# Patient Record
Sex: Female | Born: 1955 | Race: White | Hispanic: No | Smoking: Former smoker
Health system: Southern US, Community
[De-identification: ages and names within clinical notes are randomized; demographics above are authoritative.]

## PROBLEM LIST (undated history)

## (undated) DIAGNOSIS — F32A Depression, unspecified: Secondary | ICD-10-CM

## (undated) DIAGNOSIS — G43909 Migraine, unspecified, not intractable, without status migrainosus: Secondary | ICD-10-CM

## (undated) DIAGNOSIS — I10 Essential (primary) hypertension: Secondary | ICD-10-CM

## (undated) DIAGNOSIS — M1990 Unspecified osteoarthritis, unspecified site: Secondary | ICD-10-CM

## (undated) DIAGNOSIS — F329 Major depressive disorder, single episode, unspecified: Secondary | ICD-10-CM

## (undated) DIAGNOSIS — K625 Hemorrhage of anus and rectum: Secondary | ICD-10-CM

## (undated) DIAGNOSIS — G8929 Other chronic pain: Secondary | ICD-10-CM

## (undated) DIAGNOSIS — G709 Myoneural disorder, unspecified: Secondary | ICD-10-CM

## (undated) DIAGNOSIS — J189 Pneumonia, unspecified organism: Secondary | ICD-10-CM

## (undated) DIAGNOSIS — M549 Dorsalgia, unspecified: Secondary | ICD-10-CM

## (undated) DIAGNOSIS — K859 Acute pancreatitis without necrosis or infection, unspecified: Secondary | ICD-10-CM

## (undated) DIAGNOSIS — J45909 Unspecified asthma, uncomplicated: Secondary | ICD-10-CM

## (undated) HISTORY — PX: CARPAL TUNNEL RELEASE: SHX101

## (undated) HISTORY — PX: BACK SURGERY: SHX140

## (undated) HISTORY — PX: LIPOMA EXCISION: SHX5283

## (undated) HISTORY — DX: Myoneural disorder, unspecified: G70.9

## (undated) HISTORY — DX: Unspecified asthma, uncomplicated: J45.909

## (undated) HISTORY — PX: NECK MASS EXCISION: SHX2079

## (undated) HISTORY — PX: LUMBAR DISC SURGERY: SHX700

## (undated) HISTORY — DX: Unspecified osteoarthritis, unspecified site: M19.90

## (undated) HISTORY — PX: HERNIA REPAIR: SHX51

## (undated) SURGERY — UPPER ENDOSCOPIC ULTRASOUND (EUS) RADIAL
Anesthesia: Monitor Anesthesia Care | Laterality: Left

---
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
## 1982-12-27 HISTORY — PX: TUBAL LIGATION: SHX77

## 1989-08-27 HISTORY — PX: KNEE ARTHROSCOPY: SHX127

## 1998-06-11 ENCOUNTER — Encounter: Admission: RE | Admit: 1998-06-11 | Discharge: 1998-09-09 | Payer: Self-pay | Admitting: Anesthesiology

## 1998-10-09 ENCOUNTER — Ambulatory Visit (HOSPITAL_COMMUNITY): Admission: RE | Admit: 1998-10-09 | Discharge: 1998-10-09 | Payer: Self-pay | Admitting: Family Medicine

## 1998-10-17 ENCOUNTER — Ambulatory Visit (HOSPITAL_COMMUNITY): Admission: RE | Admit: 1998-10-17 | Discharge: 1998-10-17 | Payer: Self-pay | Admitting: Family Medicine

## 1998-10-17 ENCOUNTER — Encounter: Payer: Self-pay | Admitting: Family Medicine

## 1998-10-23 ENCOUNTER — Encounter: Payer: Self-pay | Admitting: Family Medicine

## 1998-10-23 ENCOUNTER — Ambulatory Visit (HOSPITAL_COMMUNITY): Admission: RE | Admit: 1998-10-23 | Discharge: 1998-10-23 | Payer: Self-pay | Admitting: Family Medicine

## 1998-11-04 ENCOUNTER — Encounter: Payer: Self-pay | Admitting: Family Medicine

## 1998-11-04 ENCOUNTER — Ambulatory Visit (HOSPITAL_COMMUNITY): Admission: RE | Admit: 1998-11-04 | Discharge: 1998-11-04 | Payer: Self-pay | Admitting: Family Medicine

## 1999-02-20 ENCOUNTER — Encounter: Admission: RE | Admit: 1999-02-20 | Discharge: 1999-04-09 | Payer: Self-pay | Admitting: Neurological Surgery

## 2000-08-02 ENCOUNTER — Encounter: Payer: Self-pay | Admitting: Orthopedic Surgery

## 2000-08-02 ENCOUNTER — Encounter: Admission: RE | Admit: 2000-08-02 | Discharge: 2000-08-02 | Payer: Self-pay | Admitting: Orthopedic Surgery

## 2000-08-04 ENCOUNTER — Emergency Department (HOSPITAL_COMMUNITY): Admission: EM | Admit: 2000-08-04 | Discharge: 2000-08-04 | Payer: Self-pay | Admitting: *Deleted

## 2000-10-11 ENCOUNTER — Other Ambulatory Visit: Admission: RE | Admit: 2000-10-11 | Discharge: 2000-10-11 | Payer: Self-pay | Admitting: Family Medicine

## 2000-10-18 ENCOUNTER — Encounter: Admission: RE | Admit: 2000-10-18 | Discharge: 2000-10-18 | Payer: Self-pay | Admitting: Orthopedic Surgery

## 2000-10-18 ENCOUNTER — Encounter: Payer: Self-pay | Admitting: Orthopedic Surgery

## 2001-01-10 ENCOUNTER — Encounter: Admission: RE | Admit: 2001-01-10 | Discharge: 2001-04-10 | Payer: Self-pay | Admitting: Anesthesiology

## 2001-04-06 ENCOUNTER — Encounter: Admission: RE | Admit: 2001-04-06 | Discharge: 2001-07-05 | Payer: Self-pay | Admitting: Anesthesiology

## 2001-10-17 ENCOUNTER — Encounter (INDEPENDENT_AMBULATORY_CARE_PROVIDER_SITE_OTHER): Payer: Self-pay | Admitting: Specialist

## 2001-10-17 ENCOUNTER — Ambulatory Visit (HOSPITAL_BASED_OUTPATIENT_CLINIC_OR_DEPARTMENT_OTHER): Admission: RE | Admit: 2001-10-17 | Discharge: 2001-10-17 | Payer: Self-pay | Admitting: General Surgery

## 2002-04-24 ENCOUNTER — Other Ambulatory Visit: Admission: RE | Admit: 2002-04-24 | Discharge: 2002-04-24 | Payer: Self-pay | Admitting: Family Medicine

## 2003-04-01 ENCOUNTER — Encounter: Payer: Self-pay | Admitting: Obstetrics and Gynecology

## 2003-04-09 ENCOUNTER — Encounter (INDEPENDENT_AMBULATORY_CARE_PROVIDER_SITE_OTHER): Payer: Self-pay | Admitting: Specialist

## 2003-04-09 ENCOUNTER — Inpatient Hospital Stay (HOSPITAL_COMMUNITY): Admission: RE | Admit: 2003-04-09 | Discharge: 2003-04-11 | Payer: Self-pay | Admitting: Obstetrics and Gynecology

## 2003-12-28 HISTORY — PX: TOTAL ABDOMINAL HYSTERECTOMY: SHX209

## 2003-12-30 ENCOUNTER — Emergency Department (HOSPITAL_COMMUNITY): Admission: EM | Admit: 2003-12-30 | Discharge: 2003-12-31 | Payer: Self-pay | Admitting: Emergency Medicine

## 2006-09-23 ENCOUNTER — Ambulatory Visit: Payer: Self-pay | Admitting: Emergency Medicine

## 2006-09-26 ENCOUNTER — Ambulatory Visit: Payer: Self-pay | Admitting: Emergency Medicine

## 2006-10-01 ENCOUNTER — Encounter: Admission: RE | Admit: 2006-10-01 | Discharge: 2006-10-01 | Payer: Self-pay | Admitting: Orthopedic Surgery

## 2006-10-26 ENCOUNTER — Encounter: Payer: Self-pay | Admitting: Vascular Surgery

## 2006-10-26 ENCOUNTER — Ambulatory Visit (HOSPITAL_COMMUNITY): Admission: RE | Admit: 2006-10-26 | Discharge: 2006-10-26 | Payer: Self-pay | Admitting: Neurological Surgery

## 2006-11-09 ENCOUNTER — Encounter: Admission: RE | Admit: 2006-11-09 | Discharge: 2006-11-09 | Payer: Self-pay | Admitting: Orthopedic Surgery

## 2006-12-08 ENCOUNTER — Encounter: Payer: Self-pay | Admitting: Vascular Surgery

## 2006-12-08 ENCOUNTER — Ambulatory Visit (HOSPITAL_COMMUNITY): Admission: RE | Admit: 2006-12-08 | Discharge: 2006-12-08 | Payer: Self-pay | Admitting: Orthopedic Surgery

## 2007-11-27 DIAGNOSIS — R059 Cough, unspecified: Secondary | ICD-10-CM | POA: Insufficient documentation

## 2007-11-27 DIAGNOSIS — J309 Allergic rhinitis, unspecified: Secondary | ICD-10-CM | POA: Insufficient documentation

## 2007-11-27 DIAGNOSIS — Z9079 Acquired absence of other genital organ(s): Secondary | ICD-10-CM | POA: Insufficient documentation

## 2007-11-27 DIAGNOSIS — E669 Obesity, unspecified: Secondary | ICD-10-CM | POA: Insufficient documentation

## 2007-11-27 DIAGNOSIS — R05 Cough: Secondary | ICD-10-CM | POA: Insufficient documentation

## 2007-11-27 DIAGNOSIS — IMO0002 Reserved for concepts with insufficient information to code with codable children: Secondary | ICD-10-CM | POA: Insufficient documentation

## 2007-11-27 DIAGNOSIS — R0602 Shortness of breath: Secondary | ICD-10-CM | POA: Insufficient documentation

## 2007-11-27 DIAGNOSIS — R519 Headache, unspecified: Secondary | ICD-10-CM | POA: Insufficient documentation

## 2007-11-27 DIAGNOSIS — R51 Headache: Secondary | ICD-10-CM | POA: Insufficient documentation

## 2008-06-16 ENCOUNTER — Encounter: Admission: RE | Admit: 2008-06-16 | Discharge: 2008-06-16 | Payer: Self-pay | Admitting: Orthopedic Surgery

## 2008-08-16 ENCOUNTER — Ambulatory Visit (HOSPITAL_COMMUNITY): Admission: RE | Admit: 2008-08-16 | Discharge: 2008-08-16 | Payer: Self-pay | Admitting: Neurological Surgery

## 2008-10-14 ENCOUNTER — Inpatient Hospital Stay (HOSPITAL_COMMUNITY): Admission: RE | Admit: 2008-10-14 | Discharge: 2008-10-17 | Payer: Self-pay | Admitting: Neurological Surgery

## 2008-10-14 ENCOUNTER — Ambulatory Visit: Payer: Self-pay | Admitting: Vascular Surgery

## 2008-10-31 ENCOUNTER — Emergency Department (HOSPITAL_COMMUNITY): Admission: EM | Admit: 2008-10-31 | Discharge: 2008-10-31 | Payer: Self-pay | Admitting: Emergency Medicine

## 2009-01-09 ENCOUNTER — Ambulatory Visit: Payer: Self-pay | Admitting: Vascular Surgery

## 2009-01-09 ENCOUNTER — Inpatient Hospital Stay (HOSPITAL_COMMUNITY): Admission: RE | Admit: 2009-01-09 | Discharge: 2009-01-14 | Payer: Self-pay | Admitting: Neurological Surgery

## 2009-01-31 ENCOUNTER — Ambulatory Visit: Payer: Self-pay | Admitting: Vascular Surgery

## 2009-02-21 ENCOUNTER — Ambulatory Visit: Payer: Self-pay | Admitting: Vascular Surgery

## 2009-03-14 ENCOUNTER — Ambulatory Visit: Payer: Self-pay | Admitting: Vascular Surgery

## 2009-04-16 ENCOUNTER — Ambulatory Visit: Payer: Self-pay | Admitting: Vascular Surgery

## 2009-05-02 ENCOUNTER — Ambulatory Visit: Payer: Self-pay | Admitting: Vascular Surgery

## 2010-07-25 ENCOUNTER — Emergency Department (HOSPITAL_COMMUNITY): Admission: EM | Admit: 2010-07-25 | Discharge: 2010-07-25 | Payer: Self-pay | Admitting: Emergency Medicine

## 2010-11-13 ENCOUNTER — Encounter: Admission: RE | Admit: 2010-11-13 | Discharge: 2010-11-13 | Payer: Self-pay | Admitting: Neurosurgery

## 2010-12-27 HISTORY — PX: SPINAL FIXATION SURGERY W/ IMPLANT: SHX785

## 2011-01-01 LAB — BASIC METABOLIC PANEL
BUN: 8 mg/dL (ref 6–23)
CO2: 30 mEq/L (ref 19–32)
Calcium: 9.6 mg/dL (ref 8.4–10.5)
Chloride: 104 mEq/L (ref 96–112)
Creatinine, Ser: 1.01 mg/dL (ref 0.4–1.2)
GFR calc Af Amer: >60 mL/min (ref >60)
GFR calc non Af Amer: 58 mL/min — ABNORMAL LOW (ref >60)
Glucose, Bld: 126 mg/dL — ABNORMAL HIGH (ref 70–99)
Potassium: 4.4 mEq/L (ref 3.5–5.1)
Sodium: 142 mEq/L (ref 135–145)

## 2011-01-01 LAB — CBC
HCT: 44.8 % (ref 36.0–46.0)
Hemoglobin: 14.4 g/dL (ref 12.0–15.0)
MCH: 29.3 pg (ref 26.0–34.0)
MCHC: 32.1 g/dL (ref 30.0–36.0)
MCV: 91.2 fL (ref 78.0–100.0)
Platelets: 208 10*3/uL (ref 150–400)
RBC: 4.91 MIL/uL (ref 3.87–5.11)
RDW: 12.9 % (ref 11.5–15.5)
WBC: 7.5 10*3/uL (ref 4.0–10.5)

## 2011-01-01 LAB — DIFFERENTIAL
Basophils Absolute: 0 10*3/uL (ref 0.0–0.1)
Basophils Relative: 0 % (ref 0–1)
Eosinophils Absolute: 0.2 10*3/uL (ref 0.0–0.7)
Eosinophils Relative: 3 % (ref 0–5)
Lymphocytes Relative: 28 % (ref 12–46)
Lymphs Abs: 2.1 10*3/uL (ref 0.7–4.0)
Monocytes Absolute: 0.6 10*3/uL (ref 0.1–1.0)
Monocytes Relative: 8 % (ref 3–12)
Neutro Abs: 4.6 10*3/uL (ref 1.7–7.7)
Neutrophils Relative %: 61 % (ref 43–77)

## 2011-01-01 LAB — TYPE AND SCREEN
ABO/RH(D): A POS
Antibody Screen: NEGATIVE

## 2011-01-04 ENCOUNTER — Inpatient Hospital Stay (HOSPITAL_COMMUNITY)
Admission: RE | Admit: 2011-01-04 | Discharge: 2011-01-07 | Payer: Self-pay | Source: Home / Self Care | Attending: Neurosurgery | Admitting: Neurosurgery

## 2011-01-19 NOTE — Op Note (Signed)
[name] [name], [name] [name]                ACCOUNT NO.:  [id number]  MEDICAL RECORD NO.:  [id number]          PATIENT TYPE:  INP  LOCATION:  3523                         FACILITY:  [facility]  PHYSICIAN:  Molly Savarino A. Zen Cedillos, M.D.    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  PROCEDURE NAME:  L2-3, L3-4 decompressive laminectomies with bilateral L2, L3 and L4 decompressive foraminotomies, more than would be required for simple interbody fusion alone.  L2-3, L3-4 posterior lumbar fusion utilizing tangent interbody allograft wedge, Telamon interbody PEEK cage and local autografting.  L2-3 for posterolateral arthrodesis utilizing segmental pedicle screw fixation and local autograft.  SURGEON:  Kathaleen Maser. Ebonye Reade, MD  ASSISTANT:  Donalee Citrin, MD  ANESTHESIA:  General endotracheal.  INDICATIONS:  Ms. [name] [name] is a 55 year old female with status post a previous anterior lumbar fusion at L4-5 and L5-S1 who presents with progressive worsening intractable back pain with bilateral lower extremity symptoms.  Workup demonstrates evidence of marked facet arthropathy, instability and stenosis at L2-3 and L3-4.  The patient presents now for two-level lumbar decompression and fusion in hopes of improving her symptoms.  OPERATIVE NOTE:  The patient was brought to the operating room and placed on operating table in supine position.  After adequate level of anesthesia was achieved, the patient was placed prone onto Wilson frame, appropriately padded.  The patient's lumbar region was prepped and draped in sterilely.  A 10-blade was used to make a curvilinear skin incision overlying the L2, 3, 4 levels.  This was  carried down sharply in the midline.  A subperiosteal dissection was then performed exposing the lamina and facet joints of L2, L3, L4 as well as transverse processes of the aforementioned levels.  Deep self-retaining retractor was placed.  Intraoperative fluoroscopy was used and levels were confirmed.  Decompressive laminectomy was then performed using Leksell rongeurs, Kerrison rongeurs, and high-speed drill to remove the entire lamina of L2, entire lamina of L3, inferior facets of L2 bilaterally, superior facets of L3 and a superior facets of L4 bilaterally.  All bone was cleaned and used in later autografting.  Ligamentum flavum was elevated and resected in piecemeal fashion using Kerrison rongeurs. Wide decompressive foraminotomies were then performed along the course of exiting L2, L3, and L4 nerve roots.  Bilateral diskectomies were then performed at L2-3 and L3-4.  Disk space was then sequentially dilated up to 8 mm, then the 8-mm distractor left on the patient's right side. Thecal sac and nerve root were inspected on the left side.  Disk space was then reamed and then cut with 8-mm tangent instrument.  Soft tissues were then removed from the interspace.  An 8 x 22 mm Telamon cage packed with morselized autograft, then packed into place, and recessed approximately 3 mm from the posterior cortical margin of L5. Distractors were removed from the patient's  right side.  Thecal sac and nerve roots were inspected on the right side.  Disk space once again reamed and then cut with 8-mm tangent instrument.  Soft tissue was once again removed from the interspace.  Disk space was further curettaged. Morselized autograft was then packed in the interspace starting first at L2-3.  An 8 x 26-mm tangent wedge was then packed into place and recessed approximately 1 mm from posterior cortical margin.  Distractors were removed from the patient's contralateral side.  Thecal sac and nerve roots were  protected on this side.  Disk space was then reamed and then cut with 8-mm tangent instrument.  Soft tissue was removed from the interspace.  Morselized autograft was packed in the interspace.  An 8  x 22-mm tangent cage was packed with morselized autograft, was then impacted in place and recessed approximately 2 mm from posterior cortical margin of L2.  Interbody fusion was then performed in similar fashion at L2-4 again without complications.  Pedicles at L2, L3 and L4 were then identified using surface landmarks and intraoperative fluoroscopy.  Superficial bone overlying the pedicle was then removed using a high-speed drill.  Each pedicle was then probed using pedicle awl.  Each pedicle awl track was then tapped with 5.2-mm screw tapper. Each screw down hole was then probed and found to fit solidly with bone. A 6.75 x 40-mm radius screws were then placed bilaterally at L2, L3, and L4.  Transverse processes of L2, 3 and 4 were then decorticated using high-speed drill.  Morselized autograft was packed posterolateral for later fusion.  Short segment of titanium rods were then placed over screw heads at L2, L3 and L4.  Locking caps were then placed over screw heads and locking caps were then engaged with construct under compression.  Transverse connector was placed.  Final images revealed good position of bone graft and hardware with proper operative level and normal alignment of spine.  The wound was then irrigated one final time. A medium Hemovac drain was left in the interspace.  The wound was then closed in layers with Vicryl suture.  Steri-Strips and sterile dressing were applied.  There were no apparent complications.  The patient tolerated the procedure well and she returned to the recovery room postoperatively.          ______________________________ Kathaleen Maser Laray Rivkin, M.D.     HAP/MEDQ  D:  01/04/2011  T:  01/05/2011  Job:  [id number]  Electronically Signed by Julio Sicks M.D. on  01/19/2011 08:15:32 AM

## 2011-01-21 LAB — SURGICAL PCR SCREEN: Staphylococcus aureus: NEGATIVE

## 2011-01-22 ENCOUNTER — Encounter
Admission: RE | Admit: 2011-01-22 | Discharge: 2011-01-22 | Payer: Self-pay | Source: Home / Self Care | Attending: Neurosurgery | Admitting: Neurosurgery

## 2011-04-08 ENCOUNTER — Ambulatory Visit
Admission: RE | Admit: 2011-04-08 | Discharge: 2011-04-08 | Disposition: A | Discharge status: Home / Self Care | Payer: Medicare Other | Source: Ambulatory Visit | Attending: Neurosurgery | Admitting: Neurosurgery

## 2011-04-08 ENCOUNTER — Other Ambulatory Visit: Payer: Self-pay | Admitting: Neurosurgery

## 2011-04-08 DIAGNOSIS — M48061 Spinal stenosis, lumbar region without neurogenic claudication: Secondary | ICD-10-CM

## 2011-04-08 DIAGNOSIS — M5137 Other intervertebral disc degeneration, lumbosacral region: Secondary | ICD-10-CM

## 2011-04-08 DIAGNOSIS — M51379 Other intervertebral disc degeneration, lumbosacral region without mention of lumbar back pain or lower extremity pain: Secondary | ICD-10-CM

## 2011-04-12 LAB — POCT I-STAT 4, (NA,K, GLUC, HGB,HCT)
Glucose, Bld: 104 mg/dL — ABNORMAL HIGH (ref 70–99)
Hemoglobin: 15.3 g/dL — ABNORMAL HIGH (ref 12.0–15.0)
Potassium: 4.1 mEq/L (ref 3.5–5.1)
Sodium: 138 mEq/L (ref 135–145)

## 2011-04-12 LAB — BASIC METABOLIC PANEL
CO2: 28 mEq/L (ref 19–32)
Calcium: 9.2 mg/dL (ref 8.4–10.5)
Calcium: 8.7 mg/dL (ref 8.4–10.5)
Chloride: 102 mEq/L (ref 96–112)
GFR calc Af Amer: >60 mL/min (ref >60)
GFR calc non Af Amer: >60 mL/min (ref >60)
Glucose, Bld: 114 mg/dL — ABNORMAL HIGH (ref 70–99)
Potassium: 4.6 mEq/L (ref 3.5–5.1)
Sodium: 142 mEq/L (ref 135–145)
Sodium: 137 mEq/L (ref 135–145)

## 2011-04-12 LAB — ANAEROBIC CULTURE

## 2011-04-12 LAB — CULTURE, ROUTINE-ABSCESS

## 2011-04-12 LAB — CBC
HCT: 33.8 % — ABNORMAL LOW (ref 36.0–46.0)
Hemoglobin: 13.2 g/dL (ref 12.0–15.0)
Hemoglobin: 11.4 g/dL — ABNORMAL LOW (ref 12.0–15.0)
MCHC: 33.8 g/dL (ref 30.0–36.0)
MCV: 85.7 fL (ref 78.0–100.0)
RBC: 4.71 MIL/uL (ref 3.87–5.11)
RBC: 3.94 MIL/uL (ref 3.87–5.11)
RDW: 13.4 % (ref 11.5–15.5)
RDW: 13.8 % (ref 11.5–15.5)

## 2011-04-12 LAB — GRAM STAIN

## 2011-05-11 NOTE — H&P (Signed)
NAMEJANEANE, COZART NO.:  [id number]   MEDICAL RECORD NO.:  [id number]          PATIENT TYPE:  INP   LOCATION:  2037                         FACILITY:  [facility]   PHYSICIAN:  Larina Earthly, M.D.    DATE OF BIRTH:  Jul 28, 1956   DATE OF ADMISSION:  01/09/2009  DATE OF DISCHARGE:                              HISTORY & PHYSICAL   PRIMARY CARE PHYSICIAN:  Windle Guard, M.D.   CHIEF COMPLAINT:  Drainage from abdominal incision x2 months.   HISTORY OF PRESENT ILLNESS:  Ms. Prestage is a 55 year old Caucasian  female who is status post anterior lumbar decompression on October 14, 2008, by Dr. Danielle Dess.  Dr. Tawanna Cooler Early assisted with the anterior  exposure.  She was discharged home on October 17, 2008.  Apparently, on  October 31, 2008, she had a coughing fit in which she felt a pop in her  left lower quadrant.  She then noticed bloody thin drainage from her  distal surgical incision.  She was seen in the emergency department  where they did abdominal x-rays which showed no gross abnormalities.  She was seen by Dr. Danielle Dess within the next day or so, but apparently was  never prescribed antibiotics.  She has been doing dressing changes  herself using a cut up towel which she is applying over the incision and  changing it multiple times during the day as needed for saturation.  The  drainage has now developed more thick consistency with sediment.  Her  abdominal incision remains tender to touch.  She, however, has had no  fever.  She contacted Dr. Danielle Dess again to report persistent drainage and  he did order a CT scan of the abdomen and pelvis with contrast which was  done on January 09, 2009, which showed large complex fluid collection  with gas along anterior margin of the anterior abdominal wall  musculature and the pelvis and felt most consistent with abscess.  She  also had mild pelvic reactive adenopathy and sigmoid diverticulosis  without evidence of diverticulitis.   PAST MEDICAL AND SURGICAL HISTORY:  1. Two back surgeries, most recently anterior lumbar decompression of      L4-5 and L5-S1 with an arthrodesis with PEEK spacer, allograft and      autograft on October 14, 2008, for lumbar spondylosis and      radiculopathy by Dr. Danielle Dess.  2. History of asthma.  3. Allergic rhinitis.  4. Chronic headaches felt most consistent with migraines.  5. Back lipomas, status post resection x2.  6. Hysterectomy.  7. Obesity.  8. Recurrent childhood pneumonia.  9. History of rectal prolapse with history of repair.  10.History of bladder tacking.   MEDICATIONS:  1. Neurontin 300 mg p.o. t.i.d.  2. Robaxin t.i.d.  She believes the dose is 500 mg.  3. Vicodin p.r.n.   ALLERGIES:  IBUPROFEN which causes GI upset and MUSHROOMS which causes  rash.   FAMILY HISTORY:  Mother was diagnosed with Hodgkin lymphoma and she has  had a brother who died of liver cancer who had a history of alcohol  abuse.   SOCIAL HISTORY:  She has not smoked since March 1996.  She has had no  recent alcohol use.  She lives with her husband and her mother in  [place].  She is currently unemployed and apparently was denied  disability after her recent back surgery as it was felt to be a  preexisting condition.   REVIEW OF SYSTEMS:  She continues to have back and lower extremity pain  following her back surgery.  She has frequent attacks of muscle spasm  all over her back.  She is able to ambulate without difficulty.  Denies  any numbness or tingling other than some tingling along the median nerve  distribution on her right upper extremity.  She does wear top dentures  and also has some teeth pain on the right lower jaw.  This is where she  had a previous nerve root canal.  She denies chest pain, shortness of  breath, or dysuria.   PHYSICAL EXAMINATION:  GENERAL:  Ms. Geno is a 55 year old Caucasian  female who is alert and cooperative.  She is tearful presently with the  idea  of going to surgery.  She has obese body habitus.  HEENT:  Head is normocephalic and atraumatic.  Pupils are equal, round,  and reactive to light.  Oral mucosa is pink and moist.  No erythema was  noted.  She does have complete upper denture.  She has several missing  teeth on the lower jaw on her right.  There is no dental abscess noted.  NECK:  Supple.  No carotid bruits were auscultated.  HEART:  Regular rate and rhythm.  No murmur, rub, or gallop was noted.  CHEST:  Lung sounds were clear throughout.  No wheezes or rhonchi were  noted.  ABDOMEN:  Soft and nondistended, but obese.  She had good bowel sounds.  She has a healing midline lower abdominal incision, is tender to touch  there at the distal incision.  There is a small punctate opening that  does have active drainage which is a combination of thin and thicker  drainage with a yellowish-orange color.  There was no significant  surrounding erythema.  EXTREMITIES:  Palpable dorsalis pedis and radial pulses bilaterally.  NEUROLOGIC:  She is alert and oriented x4.  Lower extremity and upper  extremity movements are strong and symmetrical bilaterally.   IMPRESSION:  Abdominal wall abscess with recent history of anterior  lumbar decompression in October 2009.   PLAN:  Ms. Giambra will be admitted to [facility].  She will be  taken to the operating room by Dr. Bosie Helper partner, Dr. Venida Jarvis for an I and D of her abdominal wall abscess.  At this point, we  will plan to start IV antibiotics and obtain intraoperative cultures and  begin dressing changes postoperatively.  Dr. Danielle Dess is also aware of her  admission.       Jerold Coombe, P.A.      Larina Earthly, M.D.  Electronically Signed    AWZ/MEDQ  D:  01/09/2009  T:  01/10/2009  Job:  [id number]   cc:   Larina Earthly, M.D.  Stefani Dama, M.D.  Windle Guard, M.D.

## 2011-05-11 NOTE — Discharge Summary (Signed)
NAMENARCISA, GANESH                ACCOUNT NO.:  [id number]   MEDICAL RECORD NO.:  [id number]          PATIENT TYPE:  INP   LOCATION:  3036                         FACILITY:  [facility]   PHYSICIAN:  Stefani Dama, M.D.  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   DISCHARGE DIAGNOSIS:  Lumbar spondylosis at L4-L5 and L5-S1 with lumbar  radiculopathy.   OPERATION:  Anterior lumbar decompression at L4-L5 and L5-S1 and  arthrodesis with PEEK spacer, allograft, and autograft on that day.   CONDITION ON DISCHARGE:  Improving.   HOSPITAL COURSE:  Luca Burston is a 55 year old individual who has had  significant problems with back and bilateral lower extremity pain.  She  has advanced spondylitic degeneration at L4-L5 and L5-S1.  She has been  advised regarding surgical decompression arthrodesis via an anterior  retroperitoneal approach.  She is taken to the operating room on the day  of admission where this procedure was performed with the help of Dr.  Tawanna Cooler Early.  Postoperatively, the patient complained of the typical  retroperitoneal pain with this rapidly improved on day #2.  She was  started on some oral pain medication and gradually mobilized.  Her  incision has remained clean and dry.  Foley catheter was removed on the  second postoperative day and she was started on oral pain medication.  She seemed to be tolerating the pain medication well.  She has had a  bowel movement and has been advanced to a regular diet and her incision  is clean and dry.  At time of discharge, she is given a prescription for  Percocet, #40, without refills; Valium 5 mg, #20, without refills; and  Lovenox 40 mg subcu daily for 7 days.   CONDITION ON DISCHARGE:  Stable.      Stefani Dama, M.D.  Electronically  Signed     HJE/MEDQ  D:  10/17/2008  T:  10/18/2008  Job:  [id number]

## 2011-05-11 NOTE — Op Note (Signed)
[name] [name], [name] [name]                ACCOUNT NO.:  [id number]   MEDICAL RECORD NO.:  [id number]          PATIENT TYPE:  INP   LOCATION:  3107                         FACILITY:  [facility]   PHYSICIAN:  Larina Earthly, M.D.    DATE OF BIRTH:  07/17/1956   DATE OF PROCEDURE:  10/14/2008  DATE OF DISCHARGE:                               OPERATIVE REPORT   PREOPERATIVE DIAGNOSIS:  Degenerative disk disease, lumbar spine.   POSTOPERATIVE DIAGNOSIS:  Degenerative disk disease, lumbar spine.   PROCEDURE:  Anterior exposure for a left ALIF, which would be dictated  as a separate note by Dr. Barnett Abu.   SURGEON:  Larina Earthly, MD   ASSISTANT:  Stefani Dama, MD   ANESTHESIA:  General endotracheal.   COMPLICATIONS:  None.   DISPOSITION:  To recovery room, stable.   INDICATIONS FOR THE PROCEDURE:  The patient is a 55 year old white  female with progressively degenerative lumbar disk disease.  She was  seen preoperatively by Dr. Danielle Dess who recommended an L4-5, L5-1 lumbar  fusion.  She is taken to the operating room this time for exposure.  I  discussed the procedure with the patient including a potential risk to  major vascular structures in the pelvis, ureter and the potential risk  for bleeding and infection were felt to be low.  The patient understood  and wished proceed with the procedure.   PROCEDURE IN DETAIL:  The patient was taken to the operating room and  placed supine position.  The area of the abdomen was prepped and draped  in a sterile fashion.  Incision was made to the left paramedian incision  and carried down through the subcutaneous tissue with electrocautery.  The patient was moderately obese.  The anterior fascia was exposed and  the fat was mobilized off the anterior fascia.  The anterior rectus  sheath was opened longitudinally just to the left of the linea alba.  The rectus muscle was reflected laterally.  The semilunar line was  identified and the  retroperitoneal space was entered bluntly below the  semilunar line.  The retroperitoneal contents were mobilized from the  left to the right.  The retroperitoneal space was entered lateral to the  posterior sheath and the posterior sheath was opened laterally as well.  The peritoneal space was not entered and the retroperitoneal dissection  was continued to mobilize down to the level of psoas muscle.  The iliac  vessels were identified and the ureter was identified.  The uterine and  peroneal contents were mobilized to the right.  The Balfour retractor  was used for exposure.  The area in the bifurcation of the iliac vessels  were identified to give exposure to L5-S1.  The middle sacral vessels  were controlled with hemoclips and divided.  The iliac vessels were  mobilized to allow adequate access to the L5-S1 disk space.  Attention  was then turned to the mobilization for exposure of L4-5.  The  iliolumbar vein was identified and was ligated with 2-0 silk ties and  divided.  The iliac vessels were continued  to be mobilized to allow  retraction to the right of the vertebral body.  This gave adequate  exposure for L4-5 diskectomy.  Next, the Brau retractor was positioned  on the table and the blades were positioned to give exposure for L4-5  diskectomy.  On positioning this, the tie that was on the iliac vein  that had been used to control the iliolumbar vein, became dislodged.  Bleeding was controlled with digital pressure initially and then sponge  sticks.  The resulting defect in the iliac vein was closed with a  running 5-0 Prolene suture.  The sponge sticks were removed and the  adequate hemostasis was obtained.  The remaining lumbar fusion portion  of the operation will be dictated as a separate note by Dr. Barnett Abu.  Upon completion of the disk stabilization, the retroperitoneal  contents were allowed to return into the retroperitoneal space on the  left pelvis.  There was no  injury to the ureter or the iliac vessels.  The wound was irrigated.  The anterior rectus sheath was closed with a 0  Vicryl suture in a running fashion.  The skin was closed with 3-0  subcuticular Vicryl stitch.  Benzoin and Steri-Strips were applied and  the patient was taken to the recovery room in stable condition.      Larina Earthly, M.D.  Electronically Signed     TFE/MEDQ  D:  10/15/2008  T:  10/15/2008  Job:  [id number]   cc:   Stefani Dama, M.D.

## 2011-05-11 NOTE — Discharge Summary (Signed)
[name] [name], TURVEY NO.:  [id number]   MEDICAL RECORD NO.:  [id number]          PATIENT TYPE:  INP   LOCATION:  3031                         FACILITY:  [facility]   PHYSICIAN:  Larina Earthly, M.D.    DATE OF BIRTH:  [birth year]   DATE OF ADMISSION:  01/09/2009  DATE OF DISCHARGE:  01/14/2009                               DISCHARGE SUMMARY   ATTENDING PHYSICIAN:  Larina Earthly, MD   ADMISSION DIAGNOSIS:  Abdominal wall abscess status post recent anterior  lumbar decompression.   FINAL DISCHARGE DIAGNOSES:  1. Abdominal wall abscess status post incision and drainage (cultures      negative) to date.  2. History of asthma.  3. Allergic rhinitis.  4. Chronic headaches, felt most consistent with migraines.  5. History of back lipoma status post resection x2.  6. Hysterectomy.  7. Obesity.  8. Recurrent childhood pneumonia.  9. History of rectal prolapse with status post repair.  10.History of bladder tacking.  11.Lumbar spondylosis and radiculopathy status post 2 back surgeries,      most recently anterior lumbar decompression at L4-5 and L5-S1 on      October 14, 2008, by Dr. Danielle Dess.   BRIEF HISTORY:  Ms. Cavalieri is a 55 year old Caucasian female, who has  undergone status post lumbar decompression of L4-L5 and L5-S1 by Dr.  Danielle Dess.  This was done on October 14, 2008.  In early November, she had  a coughing fit and felt a pop in her left lower quadrant.  She then  noticing some bloody drainage from her distal surgical incision.  She  was seen in the emergency department within the next few weeks, also by  Dr. Danielle Dess.  She was not prescribed antibiotics but continued daily and  as needed dry dressing changes to her distal abdominal wound.  She had  no fevers.  Recently, the abdominal incision has become more tender and  drainage more thickened in nature.  Dr. Danielle Dess ordered a CT scan of the  abdomen and pelvis with contrast, which was done on January 09, 2009,  which showed a large complex fluid collection, most consistent with  abscess.  The measurements were 13.2 x 8.8 x 11.9 cm.  It was felt she  should be taken to the operating room for incision and drainage.   HOSPITAL COURSE:  Ms. Roedl was admitted to [facility] on  January 09, 2009.  She underwent incision and drainage of her abdominal  wall abscess on that same day by Dr. Bosie Helper partner, Dr. Waverly Ferrari.  Cultures were sent, which showed no growth x3 days.  Anaerobic  cultures are still preliminary but also showed no organisms.  While we  are awaiting culture results, she was placed on Zosyn.  She is also on  Lovenox for DVT prophylaxis.  She had twice a day wet-to-dry dressing  changes to her abdominal wound.  She required Percocet and morphine for  pain initially.  By January 14, 2009, Ms. Klinger was felt appropriate  for discharge home.  Since the cultures were negative, her antibiotics  were discontinued.  We felt her wound was healing well, and we changed  wet-to-dry saline gauze dressing changes to once daily.  There was a  fair amount of tunneling that required packing during the dressing  change.  The wound appeared clean at discharge.  There is no evidence of  surrounding cellulitis.  Her vitals were stable showing most recently a  temperature of 98.2, heart rate of 68, blood pressure 113/73, oxygen  saturation 98% on room air.  She was ambulating independently, although  with some back pain complaints, which have been chronic in nature.  She  is also tolerating a regular food.  Her postoperative labs show a sodium  of 137, potassium 4.6, chloride 102, CO2 28, blood glucose 114, BUN of  7, creatinine 0.87, calcium 8.7.  White count of 10.3, hemoglobin 11.4,  hematocrit of 33.8, and platelet count of 332.   DISPOSITION:  Ms. Winburn was felt appropriate for discharge home on  January 14, 2009, in stable and improving condition.   DISCHARGE MEDICATIONS:   1. Neurontin 300 mg p.o. t.i.d.  2. Robaxin t.i.d., she believes the dose is 500 mg daily as needed for      spasms.  3. Vicodin as needed.  She was instructed to hold this while on      Percocet.  4. Percocet 5/325 mg 1-2 tablets p.o. q.4 h. p.r.n. for pain.   DISCHARGE INSTRUCTIONS:  She is to continue her preoperative diet,  increase her activity slowly, and avoid driving for the next 2 weeks  until seen by Dr. Arbie Cookey.  She may shower.  She can coordinate these with  her  Just prior dressing changes.  I have arranged for home health nurse to  assist with daily saline gauze dressing changes to her abdominal wound,  as well as a home health PT for home safety evaluation.  She will see  Dr. Arbie Cookey in approximately 2 weeks, but she should call sooner if she  develops fever greater than 101, redness, or purulent drainage from her  abdominal wound.  She is to follow up with Dr. Danielle Dess as directed.      Jerold Coombe, P.A.      Larina Earthly, M.D.  Electronically Signed    AWZ/MEDQ  D:  01/14/2009  T:  01/14/2009  Job:  [id number]   cc:   Stefani Dama, M.D.  Windle Guard, M.D.

## 2011-05-11 NOTE — Assessment & Plan Note (Signed)
OFFICE VISIT   HADAR, ELGERSMA  DOB:  1956/10/31                                       03/14/2009  KGURK#:[id number]   The patient presents today for continued followup of her abdominal wall  abscess that she developed after an ALIF procedure.  I had I and D'd her  wound on 01/09/2009.  She continues to have contraction of her wound and  now has just the area of a Q-tip in the base of this.  This does not  undermine and there is no evidence of any excessive drainage from this.  The wound is approximately 0.5 cm in diameter and tracks approximately  1.5 to 2 cm.  She has no surrounding erythema and is having much less  discomfort.  She was asking about returning to her usual activities and  I feel she is fine from an abdominal standpoint.  She is to follow up  with Dr. Danielle Dess regarding ongoing discomfort.  She will see [place] again in  6 weeks at which time hopefully she will have complete closure of her  wound.   Larina Earthly, M.D.  Electronically Signed   TFE/MEDQ  D:  03/14/2009  T:  03/14/2009  Job:  [id number]

## 2011-05-11 NOTE — Op Note (Signed)
NAMENEMESIS, RAINWATER                ACCOUNT NO.:  [id number]   MEDICAL RECORD NO.:  [id number]          PATIENT TYPE:  INP   LOCATION:  3107                         FACILITY:  [facility]   PHYSICIAN:  Stefani Dama, M.D.  DATE OF BIRTH:  [birth year]   DATE OF PROCEDURE:  10/14/2008  DATE OF DISCHARGE:                               OPERATIVE REPORT   PREOPERATIVE DIAGNOSIS:  Lumbar spondylosis at L4-L5 and L5-S1 with  lumbar radiculopathy.  Lateral recess stenosis.   POSTOPERATIVE DIAGNOSIS:  Lumbar spondylosis at L4-L5 and L5-S1 with  lumbar radiculopathy.  Lateral recess stenosis.   PROCEDURES:  Anterior lumbar decompression at L4-L5 and L5-S1,  arthrodesis with PEEK spacer and allograft with infuse anterior plate  fixation at L4-L5 and L5-S1.   SURGEON:  Stefani Dama, MD   CO-SURGEONLarina Earthly, MD for closure and approach.   INDICATIONS:  Darien Mignogna is a 55 year old individual who has had  significant back pain with bilateral lower extremity pain, having had  advanced spondylitic changes at L4-L5 and L5-S1.  The patient was  advised regarding surgical decompression and arthrodesis at the L4-L5  and L5-S1 levels.  She is now taken to the operating room for this  procedure.   PROCEDURE:  The patient was brought to the operating room and placed on  the table in supine position.  After smooth induction of general  endotracheal anesthesia, she had placement of appropriate central venous  arterial monitoring lines and a Foley catheter.  The abdomen was prepped  and draped with alcohol and DuraPrep and Dr. Arbie Cookey performed an anterior  retroperitoneal approach to L4-L5 and L5-S1 which he will dictate  separately and I started my portion of the procedure.  I opened the  anterior longitudinal ligament at L4-L5 and removed a significant  quantity of severely degenerated, desiccated disk material at the L4-L5  level.  Series of curettes and rongeurs were used to remove the disk  from within the disk space until the region of posterior longitudinal  ligament was reached.  Self-retaining spreader was placed in the wound  and carefully the interspace could be brought back to plate.  There was  substantial osteophytic overgrowth from the inferior margin of the body  of L4 and this was taken up with a 2 and 3-mm Kerrison punch laterally  to either side.  The superior margin of the body of L5 was similarly  decompressed.  The endplates were then rongeured, smoothed, and prepared  with a high-speed drill and a series of curettes to remove any remnants  of cartilaginous material.  The interspace was then sized for an  appropriate-sized spacer and it was felt that a 12-mm 8-degree 26 x 30-  mm spacer would fit well into this interspace.  This was then filled  with some Vitoss which was soaked with the patient's blood in addition  to some infuse sponge and then this was tamped into the interspace of  the squid applicator and then the anterior plate was fixed with 20-mm  screws using the available guide.  Final radiographs were obtained  in AP  and lateral projection and identified good position of the spacer at L4-  L5.  Attention was then turned to L5-S1.  Retractors were replaced to  expose L5-S1 and then the interspace was opened with #15 blade.  A  series of curettes and rongeurs were similarly used to decompress the  space.  Once the endplates were completely decorticated and the back of  the disk space was completely evacuated again, there was noted to be a  substantial osteophyte from the inferior margin of the body of L5 which  was taken up with 2 and 3-mm Kerrison punch.  Hemostasis from epidural  bleeding was controlled with some small pledgets of Gelfoam soaked in  thrombin which were later irrigated away.  Ultimately, the interspace  was sized for a 12-mm 12 degrees lordosis spacer measuring 26 x 30 mm in  size.  This was again filled with same material and then  the anterior  plate was fixed with 4 locking 20-mm screws.  Final radiographs were  obtained in AP and lateral projections of L4-L5 and L5-S1.  The  retractors were carefully removed and the procedure was then turned over  to Dr. Tawanna Cooler Early for final closure.  The patient tolerated the  procedure well.  Blood loss for this portion was estimated at 450 mL.      Stefani Dama, M.D.  Electronically Signed     HJE/MEDQ  D:  10/14/2008  T:  10/15/2008  Job:  [id number]

## 2011-05-11 NOTE — Assessment & Plan Note (Signed)
OFFICE VISIT   NAIDA, ESCALANTE  DOB:  02/13/1956                                       05/02/2009  GBTDV#:[id number]   Lisette Mancebo presents today for continued follow up of her abdominal  wound.  She is a pleasant 55 year old female who underwent anterior  approach for lumbar fusion by myself with Dr. Danielle Dess doing the ALIF in  October 2009.  She initially did well but then presented with a large  abdominal wall abscess.  She was taken to the operating room on the day  of presentation, by Dr. Cari Caraway, on January 09, 2009.  She has had  ongoing packing of this since that time.  She had done well and had  continued shrinking of the wound and her husband is also participating  in the wound care and has done an excellent job.  Today, she looks quite  good, she has some burning around the incision but no specific pain  related to the incision.  She has completely closed this area.  There is  a 1-mm area of opening in the very bottom of this which does not track  anywhere with the wooden end of a Q-Tip probing.  She has minimal  drainage from this.  I have asked that she continue to keep a Band-Aid  over this just to simply keep from soiling her clothes and she will  notify us should she develop any new problems.  Otherwise, will see [place]  again on an as-needed basis with a healed abdominal wound.   Larina Earthly, M.D.  Electronically Signed   TFE/MEDQ  D:  05/02/2009  T:  05/05/2009  Job:  [id number]   cc:   Stefani Dama, M.D.  Di Kindle. Edilia Bo, M.D.

## 2011-05-11 NOTE — Assessment & Plan Note (Signed)
OFFICE VISIT   [name] [name], [name] [name]  DOB:  25-Jul-1956                                       01/31/2009  [id number]#:[id number]   The patient presents today for follow-up of her abdominal wall wound.  The patient is a very pleasant 55 year old white female who underwent  ALIF with approach by myself with Dr. Danielle Dess in October 2009.  She  initially had good healing of her wound but then developed drainage from  this area and presented with a large abscess, it was confirmed by CT  scan.  She was taken to the operating room on the day of presentation,  on January 09, 2009, by Dr. Cari Caraway.  She underwent debridement of  this large area and packing.  She was in the hospital for approximately  5 days following this.  She had good response to the packing.  She is  here today for follow-up.  She is having Home Health nurse pack this 2-3  times a week and her husband is packing it multiple times.  She does  have a fair amount of serous drainage continuing from this.  On physical  exam, the abdominal wound itself shows no evidence of surrounding  erythema or induration.  She has an area approximately 3-4 cm in  diameter that is opened and this does undermine most particularly to the  left where it undermines for approximately 3 cm.  The base of this all  is extremely clean with excellent granulation tissue.  I discussed this  at length with the patient and her husband.  I explained this, due to  the size of the defect, will require some time for continued closure.  She will continue her local wound care.  She will have continued daily  packing of this with normal saline soaked 4 x 4 gauze..  I will see her  again in 1 month for continued follow-up.  She does not have any  indication for antibiotics since this is extremely clean and is  responding well to local wound care.  She will notify us should she  develop any new difficulties.   Larina Earthly, M.D.  Electronically  Signed   TFE/MEDQ  D:  01/31/2009  T:  02/03/2009  Job:  [id number]   cc:   Stefani Dama, M.D.

## 2011-05-11 NOTE — Assessment & Plan Note (Signed)
OFFICE VISIT   [name] [name], [name] [name]  DOB:  03-18-1956                                       02/21/2009  EAVWU#:[id number]   The patient presents today for continued follow-up of her abdominal  wound.  She was concerned regarding the continued contraction of the  wound.  I had last seen her, it was quite clean, she did have some  undermining over the fascia.  My last visit was on January 31, 2009.  This looks quite good, she has good granulation tissue.  Her opening is  now constricted down to just less than a centimeter and the wound has  healed remarkably since January 31, 2009.  She was instructed on packing  this and keeping a wick in there so it does not completely occlude until  she is healed from the bottom up.  She continues to have pain in her  back and leg.  She was written a prescription for Tylox #40 no refills.   Larina Earthly, M.D.  Electronically Signed   TFE/MEDQ  D:  02/21/2009  T:  02/24/2009  Job:  [id number]   cc:   Stefani Dama, M.D.

## 2011-05-11 NOTE — Assessment & Plan Note (Signed)
OFFICE VISIT   [name] [name], [name] [name]  DOB:  1956-05-01                                       04/16/2009  ZOXWR#:[id number]   Patient presents today for concern regarding pain around the incision.  She is status post incision and drainage of abdominal wall abscess in  January.  She reports that there is an area inferiorly into the right of  her incision.  She reports a burning and stinging sensation.  There is  no evidence of erythema at this site or anywhere else around her  abdominal wound.  She has closed the entire area by secondary intention  except for a small area that is being continued to be kept open with a  wick.   On probing this with a cotton-tip applicator, this goes in approximately  1 cm and does not appear to track.  I looked at her abdominal wall with  ultrasound to confirm that there was no evidence of subcutaneous fluid  collection, and this was negative.  I reassured patient with this and  plan to see her again in one month.  Her husband is doing the dressing  changes, and he will continue this as well.   Larina Earthly, M.D.  Electronically Signed   TFE/MEDQ  D:  04/16/2009  T:  04/17/2009  Job:  [id number]   cc:   Stefani Dama, M.D.

## 2011-05-11 NOTE — Op Note (Signed)
[name], [name] NO.:  [id number]   MEDICAL RECORD NO.:  [id number]          PATIENT TYPE:  INP   LOCATION:  2037                         FACILITY:  [facility]   PHYSICIAN:  Di Kindle. Edilia Bo, M.D.DATE OF BIRTH:  05/02/1956   DATE OF PROCEDURE:  01/09/2009  DATE OF DISCHARGE:                               OPERATIVE REPORT   PREOPERATIVE DIAGNOSIS:  Abdominal wall abscess.   POSTOPERATIVE DIAGNOSIS:  Abdominal wall abscess.   PROCEDURE:  Incision and drainage of abdominal wall abscess.   SURGEON:  Di Kindle. Edilia Bo, MD   ASSISTANT:  Nurse.   ANESTHESIA:  General.   INDICATIONS:  This is a pleasant 55 year old woman who had undergone  anterior lumbar decompression at L4-L5 and L5-S1 by Dr. Danielle Dess.  She had  been having some abdominal discomfort and some drainage from her  incision.  She had a CT scan of the abdomen today which showed an  abscess in her anterior abdominal wall.  She is brought to the operating  room for incision and drainage.   TECHNIQUE:  The patient was taken to the operating room and received a  general anesthetic.  The abdomen was prepped and draped in the usual  sterile fashion.  At the inferior aspect of the incision, there was an  area of drainage and I opened up over this area ellipsing a skin of  macerated skin and the dissection was then carried up higher and the  abscess was entered.  There was large amount of fluid present here that  looked like old hematoma versus lymphocele that appeared infected.  There was some odor to it.  This was thoroughly evacuated and then I  removed as much of the rind that I could using a lap sponge and then  irrigated with copious amounts of saline.  All the infection appeared to  have been drained.  Hemostasis was obtained in the skin and then the  wound was packed with Kerlix soaked in saline.  Sterile dressing was  applied.  The patient tolerated the procedure well and was transferred  to recovery room in satisfactory condition.  All needle and sponge  counts were correct.      Di Kindle. Edilia Bo, M.D.  Electronically Signed     CSD/MEDQ  D:  01/09/2009  T:  01/10/2009  Job:  [id number]

## 2011-05-14 NOTE — Procedures (Signed)
[facility]  Patient:    [name] [name], [name] [name]                       MRN: [id number] Proc. Date: 03/13/01 Adm. Date:  [id number] Attending:  Thyra Breed CC:         Elisha Ponder, M.D.  Workers Therapist, music of patient.   Procedure Report  ADDENDUM:  PROCEDURE:  Bretylium bier block.  ANESTHESIOLOGIST:  Thyra Breed, M.D.  RECOMMENDATIONS:  I have recommended the patient be seen by Dr. Jerrye Beavers, psychologist here at the clinic, in order to help with her pain coping skills.  It is my opinion at she is suffering from tha great deal of emotional turmoil stemming from the pain and the limitations imposed by the pain.  The patient plans to follow up with Dr. Amanda Pea, but I would highly recommend that she be seen here by the psychologist. DD:  03/13/01 TD:  03/13/01 Job: [id number]

## 2011-05-14 NOTE — Op Note (Signed)
NAME:  [name] [name], [name] [name]                          ACCOUNT NO.:  [id number]   MEDICAL RECORD NO.:  [id number]                   PATIENT TYPE:  INP   LOCATION:  Z610                                 [facility]ACILITY[facility]  [facility]   PHYSICIAN:  Jamison Neighbor, M.D.               DATE OF BIRTH:  [birth year]   DATE OF PROCEDURE:  04/09/2003  DATE OF DISCHARGE:                                 OPERATIVE REPORT   PREOPERATIVE DIAGNOSES:  1. Pelvic relaxation.  2. Stress urinary incontinence.   POSTOPERATIVE DIAGNOSES:  1. Pelvic relaxation.  2. Stress urinary incontinence.   PROCEDURES:  1. Cystoscopy.  2. SPARC bladder neck suspension by Jamison Neighbor, M.D.  3. Anterior and posterior repair by Juluis Mire, M.D.   SURGEONS:  1. Jamison Neighbor, M.D.  2. Juluis Mire, M.D.   ANESTHESIA:  General.   COMPLICATIONS:  None.   DRAINS:  Foley catheter.   BRIEF HISTORY:  This 55 year old female is scheduled to undergo repair of  pelvic relaxation by Juluis Mire, M.D.  He had questioned whether she  might have to have a sling done at the same time.  The patient is known to  have a very large, prolapsing cystocele which was somewhat protected in so  far as stress incontinence was concerned.  But she definitely had an open  bladder neck and had definite urethral mobility with straining.  Attempt at  urodynamics was unsuccessful, as the patient had a vasovagal response, and  we could never determine a true leak point pressure.  Our intent was to do a  leak point pressure with a vaginal pack in place and see if there was an  actual leak.  The patient has agreed to undergo the sling procedure.  She  understands the risks and benefits of the procedure and gave full and  informed consent.   DESCRIPTION OF PROCEDURE:  After the successful induction of general  anesthesia, the patient was placed in the dorsal lithotomy position, prepped  with Betadine, and draped in the usual sterile fashion.  Dr.  Arelia Sneddon  performed an anterior repair and a posterior repair and left the anterior  vaginal mucosa open in preparation for the sling.  The space of Retzius was  not completely open, but dissection proceeded all the way back to the space  of Retzius on each side.  Two stab incisions were made directly above the  pubis approximately 3 fingerbreadths apart, and the SPARC needle was passed  from those incisions down to and through the endopelvic fascia and into the  previously opened space.  The cystoscope was inserted.  The bladder was  carefully inspected.  It was free of any tumor or stones.  Both ureteral  orifices were normal in configuration and location.  Inspection with both 12  degree and 70 degree lenses showed no injury to the bladder and specifically  no area where the needle had passed through the wall of the bladder.  The  [facility] sling was then pulled up and was then positioned underneath the  bladder neck with an appropriate retention so that Mayo scissors could be  passed between the sling itself and the urethra.  The protective sheath for  the sling was cut away, setting the tension.  The incision was irrigated and  closed with a series of figure-of-eight sutures of 2-0 Vicryl.  The sling  was then cut off at the skin level, and the skin was closed with Steri-  Strips.  The patient had gauze with antibiotic ointment placed within the  vagina.  Foley catheter was left to straight drain.  The patient will have  this removed prior to discharge.  The patient tolerated the procedure well  and was taken to the recovery room in good condition.                                               Jamison Neighbor, M.D.    RJE/MEDQ  D:  04/09/2003  T:  04/09/2003  Job:  [id number]   cc:   Juluis Mire, M.D.  [address] [place]  [place] [address]  Fax: [phone]

## 2011-05-14 NOTE — Op Note (Signed)
NAME:  [name] [name], [name] [name]                          ACCOUNT NO.:  [id number]   MEDICAL RECORD NO.:  [id number]                   PATIENT TYPE:  INP   LOCATION:  0443                                 FACILITY:  [facility]   PHYSICIAN:  Juluis Mire, M.D.                DATE OF BIRTH:  [birth year]   DATE OF PROCEDURE:  04/09/2003  DATE OF DISCHARGE:                                 OPERATIVE REPORT   PREOPERATIVE DIAGNOSIS:  Symptomatic pelvic relaxation with associated  stress urinary incontinence.   POSTOPERATIVE DIAGNOSIS:  Symptomatic pelvic relaxation with associated  stress urinary incontinence.   PROCEDURES:  1. Total vaginal hysterectomy with anterior and posterior colporrhaphy.  2. Sacrospinous ligament suspension.   SURGEON:  Juluis Mire, M.D.   ASSISTANT:  Raynald Kemp, M.D.   ANESTHESIA:  General endotracheal.   ESTIMATED BLOOD LOSS:  300 mL.   PACKS AND DRAINS:  None.   BLOOD REPLACED:  None.   COMPLICATIONS:  None.   INDICATIONS:  Noted in the history and physical.   DESCRIPTION OF PROCEDURE:  The patient was taken to the OR and placed in the  supine position.  After a satisfactory level of general anesthesia obtained,  the patient was placed in the dorsal lithotomy position using Allen  stirrups.  The lower abdomen, perineum, and vagina were prepped out with  Betadine and draped as a sterile field.  The bladder was emptied by in-and-  out catheterization.  Exam revealed almost complete prolapse of the uterus  and vagina.  The cervix was grasped with a Christella Hartigan tenaculum.  The reflection  of the vaginal mucosa around the cervix was incised using the knife.  We  then pushed the vaginal mucosa off.  The uterosacral ligaments were  identified and clamped, cut, and suture ligated with 0 Vicryl.  The cul-de-  sac was entered sharply.  The bladder was dissected superiorly.  The  paracervical tissue was clamped, cut, and suture ligated with 0 Vicryl.  The  vesicoureteral space was identified and entered sharply.  Using the clamp,  cut, and tie technique with suture ligatures of 0 Vicryl, the parametrium  was serially separated from the sides of the uterus.  The uterus was then  flipped, remaining pedicles were clamped and cut, uterus passed off the  operative field.  At this point in time the utero-ovarian pedicles were  secured first with a free tie of 0 Vicryl and then a suture ligature of 0  Vicryl.  These were held.  Both ovaries appeared to be normal.  A  uterosacral plication stitch of 0 Vicryl was put into place.  Both ovarian  pedicles were hemostatically intact.  These were cut.  The peritoneum was  then closed with a pursestring of 2-0 Vicryl.   Attention was now turned to the anterior repair.  The vaginal mucosa was  underlined in the midline up to  approximately a centimeter below the  urethra.  It was then incised.  Underlying fascia was dissected free from  the overlying vaginal mucosa.  The cystocele was reduced with interrupted  sutures of 2-0 Vicryl.  The edges of the vaginal mucosa were trimmed.  We  then began reapproximation at the vaginal cuff with interrupted sutures of 2-  0 Vicryl.  Approximately a 2 cm segment was opened for Dr. Logan Bores to complete  the urethral sling.   We then went to the posterior repair.  The incision was made over the  perineal body.  The skin was dissected superiorly to the vaginal opening.  It was then excised.  The vaginal mucosa was underlined in the midline up to  the top of the vaginal cuff.  This was incised.  The perirectal fascia was  dissected off the overlying vaginal mucosa.  At this point in time the  sigmoid colon was displaced to the patient's left.  The sacrospinous  ligament was easily identified and was very prominent.  Using a sheath  needle passer, a suture of 0 Prolene was put through the sacrospinous  ligament near the sacrum.  This was held.  At this point in time the   rectocele was reduced with interrupted sutures of 2-0 Vicryl.  We secured  the sacrospinous ligament stitch to the vaginal apex.  We then  reapproximated the vaginal mucosa posteriorly with interrupted sutures of 2-  0 Vicryl.  This was taken up to the introitus.  The sacrospinous ligament  suspension stitch was then tied down with good approximation of the vaginal  cuff to the sacrospinous ligament and good elevation.  We then rebuilt the  perineal body.  The skin over the perineum was closed with running  subcuticular of 2-0 Vicryl.  With this we had good support posteriorly,  anteriorly, and good suspension of the vaginal cuff.  At this point sponge,  instrument, and needle count were reported as correct by the circulating  nurse.  Again, total blood loss was 300 mL.  Dr. Logan Bores at this point came in  to complete the surgery.                                               Juluis Mire, M.D.    JSM/MEDQ  D:  04/10/2003  T:  04/10/2003  Job:  [id number]

## 2011-05-14 NOTE — Procedures (Signed)
[facility]  Patient:    [name] [name], [name] [name]                       MRN: [id number] Proc. Date: 03/13/01 Adm. Date:  [id number] Attending:  Thyra Breed                           Procedure Report  PROCEDURE:  Bretylium/lidocaine Bier block of the left upper extremity.  DIAGNOSIS:  Complex regional pain syndrome of the left upper extremity.  INTERVAL HISTORY:  The patient notes that she has improved significantly, feeling better 70% of the time to a very significant degree.  She continues to rate her pain at about 5/10 but overall feels as though the pain may be improved.  Unfortunately, from a coping perspective, she feels somewhat in disarray.  She is having frequent crying episodes and having difficulty coping with the discomfort and how it has affected her life.  Apparently the suggestion to have her seen by a psychologist for pain coping skills was not met positively with regard to Workers Compensation.  I advised the patient that I could keep giving her blocks but if we are not working on the total patient, that I felt we were giving her suboptimal care and would highly recommend that she be seen by Dr. Jerrye Beavers, the psychologist that works here at the [facility].  PHYSICAL EXAMINATION:  Blood pressure 113/67, heart rate 70, respiratory rate 22, O2 saturations 94%.  Pain level is 5/10, and temperature is 97.1.  She has some mottling over the left hand and some allodynia which is mild.  DESCRIPTION OF PROCEDURE:  After informed consent was obtained, the patient was placed in a semi-recumbent position and monitored.  An IV was established in her right upper extremity and the dorsum of her left hand.  A padded bandage was applied to the left upper extremity.  The arm was wrapped with Esmarch and the proximal cuff inflated at 300 mmHg.  I infused 40 mL of 0.5% lidocaine with 300 mg of bretylium.  Five minutes later, the distal cuff was inflated.   After full inflation of the distal cuff, the proximal cuff was deflated.  After 30 minutes of total inflation time, the cuff was brought down.  The total duration of cuff time was about 32 minutes.  The patients arm pinked up nicely, and the patient had some numbness from the lidocaine.  POSTPROCEDURE CONDITION:  Stable.  She was sedated with Versed predominantly and much less with fentanyl for the procedure.  DISPOSITION: 1. Resume previous diet. 2. Limitations on activities per instruction sheet, as outlined by my    assistant today. 3. Continue on current medications. 4. I feel strongly that the patient needs to have help with pain coping skills    in order to deal with the pain that she is suffering from and would    recommend that she be seen by Dr. Jerrye Beavers, the psychologist here at    the clinic. DD:  03/13/01 TD:  03/13/01 Job: [id number]

## 2011-05-14 NOTE — Procedures (Signed)
[facility]  Patient:    [name] [name], [name] [name]                       MRN: [id number] Proc. Date: 02/13/01 Adm. Date:  [id number] Attending:  Thyra Breed CC:         Elisha Ponder, M.D.  Patients Claims Adjuster   Procedure Report  PROCEDURE:  Bretylium/lidocaine bier block of the left upper extremity.  DIAGNOSIS:  Complex regional pain syndrome of the left upper extremity.  ANESTHESIOLOGIST:  Thyra Breed, M.D.  INTERVAL HISTORY:  The patient has noted some decrease in pain in her arm but still complains of a lot of discomfort.  She rated her pain at 5/10 today. She is sent by Dr. Amanda Pea for repeat bier block today.  MEDICATIONS:  Unchanged.  PHYSICAL EXAMINATION:  VITAL SIGNS:  Blood pressure 128/58, heart rate 77, respiratory rate 18, O2 saturation 98%, pain level 5/10, temperature 97.4.  EXTREMITIES:  The patient demonstrated mottling of her left forearm and hand with minimal allodynia today.  DESCRIPTION OF PROCEDURE:  After informed consent was obtained, the patient was placed in the semirecumbent position and monitored.  An IV was established in her right upper quadrant and the dorsum of her left hand.  A dual blood pressure cuff was applied to the left upper extremity and checked for competency which was intact.  Her left arm was raised and wrapped with Esmarch.  The proximal cuff was inflated to 300 mmHg and infused 40 cc of 0.5% lidocaine with 300 mg of bretylium.  After completion of the infusion, the distal cuff was inflated 5 minutes later and the proximal cuff deflated. After 30 minutes of inflation time, the cuff was brought down.  Total duration of cuff time was 30 minutes.  The patient noted pinking up of her arm and tingling from the lidocaine as expected.  POSTPROCEDURE CONDITION:  Stable with minimal nausea today.  She was sedated with fentanyl and Versed in very light amounts.  DISCHARGE INSTRUCTIONS: 1. Resume previous  diet. 2. Limitation of activities per instruction sheet as outlined by my    assistant today. 3. Continue on current medications. 4. The patient plans to follow up with Dr. Amanda Pea. DD:  02/13/01 TD:  02/13/01 Job: [id number]

## 2011-05-14 NOTE — H&P (Signed)
NAME:  [name] [name], [name] [name]                          ACCOUNT NO.:  [id number]   MEDICAL RECORD NO.:  [id number]                   PATIENT TYPE:  INP   LOCATION:  Z610                                 FACILITY:  [facility]   PHYSICIAN:  Juluis Mire, M.D.                DATE OF BIRTH:  11/22/1956   DATE OF ADMISSION:  04/09/2003  DATE OF DISCHARGE:                                HISTORY & PHYSICAL   REASON FOR ADMISSION:  The patient is a 55 year old gravida 2, para 2  married white female who presents for total vaginal hysterectomy with A&P  repair and sacrospinous ligament suspension as well as suburethral sling for  management of symptomatic pelvic relaxation.   HISTORY OF PRESENT ILLNESS:  In relation to the present admission the  patient was initially seen in our office in 11/03.  She was having problems  with increasing pelvic pressure and discomfort.  She felt that was related  to pelvic relaxation.  On evaluation in the office she had a moderate-to-  severe cystocele and rectocele and urine descensus to the vaginal introitus.  She subsequently underwent ultrasound  evaluation which revealed a normal  uterus.  Both ovaries were of normal volume without abnormalities.  We did  evaluate her for urinary incontinence.  She was referred to Dr. Logan Bores who  does a suburethral sling is indicated.  We have discussed options for  management of pelvic relaxation including conservative followup or use of  pessary.  Presently, she presents for total vaginal hysterectomy.  She will  have an anterior and posterior colporrhaphy and a possible sacrospinous  ligament suspension.  Dr. Logan Bores will do the suburethral sling.   ALLERGIES:  1. IBUPROFEN.  2. ROBAXIN.   MEDICATIONS:  1. Flexeril.  2. Hydrocodone.  3. Atenolol for management of hypertension.   PAST MEDICAL HISTORY:  Significant for history of hypertension under active  management by Dr. Jeannetta Nap.  She also has a history of diverticulosis  that  required hospitalization in the past.   PAST SURGICAL HISTORY:  She has had three previous back surgeries.  Had a  previous bilateral tubal ligation.  She has had a tonsillectomy and  operations on carpal tunnel.   FAMILY HISTORY:  Mother with history of Hodgkin's disease.  Maternal  grandmother with lung disease.  Brother has a history of alcoholism.   SOCIAL HISTORY:  Reveals no present tobacco use or alcohol use.   REVIEW OF SYSTEMS:  Noncontributory.   PHYSICAL EXAMINATION:  VITAL SIGNS:  The patient is afebrile with stable  vital signs.  HEENT:  The patient normocephalic.  Pupils are equal, round and reactive to  light and accommodation.  Extraocular movements are intact.  Sclerae and  conjunctivae clear.  Oropharynx clear.  NECK:  Without thyromegaly.  BREASTS:  Not examined.  LUNGS:  Clear.  CARDIOVASCULAR:  Regular rate.  No murmurs or gallops.  ABDOMEN:  Benign.  No masses, organomegaly or tenderness.  PELVIC:  Normal external genitalia.  Vaginal mucosa is clear.  Does have  moderate cystocele, rectocele and moderate-to-severe uterine descensus.  Uterus normal size, shape and contour.  Adnexa free of masses or tenderness.  Rectovaginal exam is clear.  EXTREMITIES:  Trace edema.  NEUROLOGIC:  Grossly within normal limits.   IMPRESSION:  1. Symptomatic pelvic relaxation.  2. Diverticulosis.  3. Hypertension.   PLAN:  Present time after discussing options, the patient will proceed with  total vaginal hysterectomy with anterior and posterior repair and possible  sacrospinous ligament suspension.  Dr. Logan Bores will do a suburethral sling.  We have discussed the potential risk of recurrent pelvic relaxation despite  the present surgical management.  The overall risk of surgery have been  discussed including the risks of anesthetics, the risk of infection, the  risk of hemorrhage that could necessitate transfusion with the risk of AIDS  or hepatitis, risk of injury to  adjacent organs including bladder, bowel,  ureters that could require further exploratory surgery.  The risk of deep  venous thrombosis and pulmonary embolus.  The patient does understand the  potential risk of surgery.  We have reiterated alternatives.  The patient  does wish to proceed with surgical  management.                                               Juluis Mire, M.D.    JSM/MEDQ  D:  04/09/2003  T:  04/09/2003  Job:  [id number]

## 2011-05-14 NOTE — Op Note (Signed)
[facility]  Patient:    [name] [name], [name] [name] Visit Number: [id number] MRN: [id number]          Service Type: DSU Location: [facility] Attending Physician:  Delsa Bern Dictated by:   Lorne Skeens. Hoxworth, M.D. Proc. Date: 10/17/01 Admit Date:  10/17/2001                             Operative Report  PREOPERATIVE DIAGNOSES:  Lymphadenopathy.  POSTOPERATIVE DIAGNOSES:  Lymphadenopathy.  OPERATIVE PROCEDURE:  Excisional biopsy of right posterior cervical lymph node.  SURGEON:  Lorne Skeens. Hoxworth, M.D.  ANESTHESIA:  Local with IV sedation.  BRIEF HISTORY:  The patient is a 55 year old white female who has been followed by her family physician for approximately 6 weeks or so with a tender, swollen lymph node in the right posterior neck. This has not responded to antibiotics and has not decreased in size. Discussed options including continued observation versus excision and she strongly desires excision for diagnosis. The nature of the procedure, indications, risks of bleeding, infection and simple nerve injury were discussed and understood preoperatively. She now is brought to the operating room for this procedure.  DESCRIPTION OF PROCEDURE:  The patient is brought to the operating room and placed in supine position on the operating room table and IV sedation was administered. She was carefully positioned in the left lateral decubitus position and the right posterior neck prepped and draped. The lymph node palpated about 1 cm to 1-1/2 cm and was right at the hairline on the posterior neck. Local anesthesia was used to infiltrate the skin and underlying soft tissue. A small transverse incision was made in the skin crease and dissection carried down through the subcutaneous tissue. Dissection was carried down directly onto the node which was fairly discrete and dissected easily away from surrounding tissue. It was completely excised. The  skin was then closed with running subcuticular 4-0 Monocryl and Steri-Strips. Sponge, needle, lap and instrument counts were correct. Dry, sterile dressing was applied and the patient was taken to the recovery room in good condition. Dictated by:   Lorne Skeens. Hoxworth, M.D. Attending Physician:  Delsa Bern DD:  10/17/01 TD:  10/18/01 Job: [id number]

## 2011-05-14 NOTE — Discharge Summary (Signed)
   NAME:  [name] [name], [name] [name]                          ACCOUNT NO.:  [id number]   MEDICAL RECORD NO.:  [id number]                   PATIENT TYPE:  INP   LOCATION:  0446                                 FACILITY:  [facility]   PHYSICIAN:  Juluis Mire, M.D.                DATE OF BIRTH:  1956-05-07   DATE OF ADMISSION:  04/09/2003  DATE OF DISCHARGE:  04/11/2003                                 DISCHARGE SUMMARY   ADMISSION DIAGNOSIS:  Symptomatic pelvic relaxation with associated stress  urinary incontinence.   DISCHARGE DIAGNOSIS:  Symptomatic pelvic relaxation with associated stress  urinary incontinence.   PROCEDURES:  Total vaginal hysterectomy with anterior and posterior  colporrhaphy, sacrospinous ligament suspension, and subsequent suburethral  sling.   HISTORY AND PHYSICAL:  For complete History and Physical, see dictated  noted.   HOSPITAL COURSE:  The patient underwent the above-noted surgery.  Postoperatively, she did very well.  Postoperative hemoglobin was 12.7, and  she was discharged home on her second postop day.  At that point, she was  tolerating a regular diet and was ambulating without difficulty.  She had an  intact perineum with no active vaginal bleeding, was voiding after the Foley  had been removed, and was passing flatus.   In terms of complications, none were encountered during her stay in the  hospital.  The patient is discharged home in stable condition.   DISPOSITION:  Routine postop instructions were given.  She is to avoid heavy  lifting, vaginal entry, and driving a car.   DISCHARGE MEDICATIONS:  Tylox as needed for pain.   FOLLOW UP:  She will watch for signs of infection, nausea, vomiting, active  vaginal bleeding, or excessive pain and follow up in the office in one week.                                               Juluis Mire, M.D.    JSM/MEDQ  D:  04/11/2003  T:  04/11/2003  Job:  [id number]

## 2011-05-14 NOTE — Op Note (Signed)
[facility]  Patient:    [name] [name], [name] [name]                   MRN: [id number] Proc. Date: 01/10/01 Attending:  Thyra Breed, M.D. CC:         Elisha Ponder, M.D.  Workmens Compensation Carrier   Operative Report  PROCEDURE:  Engineer, manufacturing block.  DIAGNOSIS:  Complex regional pain syndrome of the left upper extremity.  ANESTHESIOLOGIST:  Thyra Breed, M.D.  INTERVAL HISTORY:  [name] [name] is a 55 year old who was seen to [place] by Dr. 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  The patient states that she was in her usual state of health up until a work related injury where she was pushing a wheelchair, went over a bump, and the client was falling out of the chair.  She grabbed for the client and she felt a sharp shooting pain which radiated from her left hand up to her left shoulder.  She was seen by Dr. Fraser Din up until the point where he retired and subsequently has been followed by Dr. Amanda Pea.  She was advised that she had a sprain of her hand and wrist.  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  She was treated conservatively with bracing and medications with no sustained improvement and underwent surgery on 08/30/00.  She was working up until this point.  Postoperatively, she felt as if she was doing well for a few weeks and then developed an aching, burning pain predominantly localized to the palm of her hand and volar aspect of her wrist.  It is made worse by use and improved by applying heat or ice at times.  She was getting physical therapy but she has not been getting any recently.  She gets numbness and tingling into the hand.  She gets decreased hand grip.  She was treated with Voltaren at one point and Elavil both of which were not helpful.  She has recently been on Neurontin 300 mg q.8h. and Mobic once a day and  does not feel like it helps much.  She presents today from Dr. Dominica Severin for consideration of sympathetic block.  Her other medical problems include chronic low back pain on the basis of scoliosis and lumbar spondylosis for which she takes hydrocodone, diverticulosis and hypertension.  CURRENT MEDICATIONS:  Atenolol, hydrocodone, Neurontin, aspirin and Mobic.  ALLERGIES:  ROBAXIN makes her sick and IBUPROFEN makes her sick and causes rashes.  Significant for mushroom allergy.  FAMILY HISTORY:  Positive for diabetes, coronary artery disease, hepatitis C and Hodgkins.  ACTIVE MEDICAL PROBLEMS:  Hypertension, history of asthma - currently on albuterol inhaler, gastroesophageal reflux disease which responds to over-the-counter Zantac, low back pain syndrome as mentioned above and diverticulosis.  SOCIAL HISTORY:  The patient is a nonsmoker and a nondrinker.  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  PAST SURGICAL HISTORY:  Significant for right carpal tunnel release by Dr. Annell Greening, history of back surgery in 1977 and again in 1997.  Tubes tied in 1984.  REVIEW OF SYSTEMS:  General:  Negative.  Head significant for sinus headache. Eyes: Negative.  Nose, mouth and throat negative for recent sinus irritation. Ears negative.  Pulmonary significant for asthma.  Cardiovascular: Significant for hypertension.  GI: Significant for some reflux and diverticulitis.  GU:  Negative.  Musculoskeletal:  See HPI.  Neurological: SEe HPI.  No history of seizures, strokes.  Cutaneous: Negative.  Allergies: Significant for mushroom allergy.  Hematologic negative.  Endocrine negative. Psychiatric negative.  PHYSICAL EXAMINATION:  VITAL SIGNS:  Blood pressure 108/50, heart rate 64, respiratory rate 20, O2 saturation is 97%, pain score is 4/10 and temperature is 98.8.  GENERAL:  This is an obese, pleasant female in no acute distress.  HEENT:  Head was  normocephalic, atraumatic.  Eyes: Extraocular movements are intact.  Conjunctivae and sclerae clear.  Nose patent.  Nares clear. Oropharynx significant for dental plates.  Mucosa was intact.  NECK:  The neck demonstrated good range of motion with palpable thyroid isthmus.  Carotids were 2+ and symmetric without bruits.  Palpation over the C6 tubercle on the left side resulted in a drop of her heart rate from the upper 60s down to the low 50s.  Within just a few seconds of palpation, the patient felt faint.  LUNGS:  Clear.  HEART:  Regular rate and rhythm.  BREASTS/PELVIC/ABDOMEN/RECTAL:  Exams were not performed.  BACK:  Exam revealed a negative straight leg raise sign.  Intact gait.  EXTREMITIES:  The patient demonstrated shiny tight skin over the dorsum of the left hand with some mild swelling.  There was no temperature difference. There was no new hair growth over the left hand.  There was bluish discoloration of the hands.  The nails of the left hand were more rigid than the right hand.  Radial pulses and dorsalis pedis pulses are 2+ and symmetric.  NEUROLOGICAL:  The patient was oriented x 4.  Cranial nerves II-XII were grossly intact.  Deep tendon reflexes were symmetric in the upper and lower extremities with downgoing toes.  Motor was significant for decreased hand grip in the left hand especially, tensor grip of the thumb and index finger of the left hand.  Coordination was grossly intact.  IMPRESSION: 1. Complex regional pain syndrome of the left upper extremity,    status post trauma which was a work-related problem. 2. Other medical problems per Dr. Jeannetta Nap, her primary care    physician which include hypertension, gastroesophageal reflux disease,    chronic low back pain syndrome, and diverticulosis.  DISPOSITION:  I discussed with the patient options for block including stellate ganglion block versus Bier block versus IV infusion of lidocaine.   I expressed my  concerns about doing a stellate ganglion block since her blood pressure dropped so briskly on palpation of the neck and that we would have to pull her carotid to the side as we proceeded with a stellate ganglion block. She does not feel as though she can remain still for this.  I reviewed the potential side effects and risks of IV lidocaine which she is amenable to versus bretylium Bier block which she is also amenable to.  I advised that she would probably get the most sustained improvements from the bretylium Bier block if she responds.  I did express to her that there were limitations as to its effectiveness as well as the potential and side effects of the medication as well as the procedure itself.  She was interested in pursuing this.  DESCRIPTION OF PROCEDURE:  After informed consent was obtained, the patient was placed in the semirecumbent position and monitored.  An IV was established in her right upper extremity and the dorsum of her right hand.  A dual blood pressure cuff was applied to the left upper extremity and checked for  patency which was intact.  Her left hand was raised to gravity and wrapped with an Esmarch bandage.  The proximal cuff was inflated to 300 mmHg. I infused 30 cc of 0.5% lidocaine with 300 mg of bretylium into the left upper extremity. After completion of the infusion, the distal cuff was inflated five minutes later and the proximal cuff deflated.  After a total of 30 minutes of inflation after the infusion, the cuff was allowed to go down.  The cuffs were inflated to 300 mmHg.  The patient noted some nausea after the cuffs were down which passed within 5-10 minutes.  Post procedure the patient noted some tingling of her hand but overall decrease and discomfort.  Post procedure condition:  Stable.  DISCHARGE INSTRUCTIONS: 1. Resume previous diet. 2. Limitation of activities per instruction sheet. 3. Continue on current medications. 4. The patient plans  to follow up with Dr. Amanda Pea. DD:  01/10/01 TD:  01/10/01 Job: [id number]

## 2011-05-14 NOTE — Assessment & Plan Note (Signed)
[facility]                               PULMONARY OFFICE NOTE   NAME:[name] [name] MCCANN                       MRN:          [id number]  DATE:09/23/2006                            DOB:          1956-02-28    REASON FOR CONSULTATION:  This is a self-referral by [name] [name] for  shortness of breath and persistent cough.   SUBJECTIVE:  [name] [name] is a 55 year old woman with a history of obesity,  allergic rhinitis and suspected asthma that was diagnosed clinically in  approximately 1995.  She tells me that she does not believe she has ever had  pulmonary function testing.  Her clinical symptoms at that time were  principally dyspnea on exertion and cough with upper airway noise and  wheezing.  She also has a history of frequent pneumonias as a child and  states that she was in and out of the hospital at that time.  She does not  believe that she had asthma as a child, and her breathing was fairly normal  during her teens.  She moved to [place] from [place] in 1991, and at  that time she had a significant increase in her allergic symptoms including  postnasal drip, headache and sneezing.  These improved somewhat over time  since the move.  With the advent of these symptoms, she began to have  wheezing on and off and paroxysmal cough that is productive usually of  whitish to yellowish phlegm.  She coughs ever day, but she does not produce  sputum every day.  She occasionally has a hoarse voice.  She states that she  can get acutely short of breath, either with exertion or at rest, and that  this seems to be fairly random.  She is not clear as to whether it is  associated with laughing or speech.  The wheezing seems to be random also,  but she does state that it can be heard by others in the room, and that she  believes that it may be related to her upper airway.  She has been treated  most recently with albuterol, and she believes that this may  help her  symptoms.  She has also been treated in the past with Singulair and Advair.  She is not sure that either of these medications ever made her symptoms  better.  She has a history of reflux, but this only seems to bother her when  she eats spicy foods.  She has gained weight from 229 pounds to 298 pounds  over the last 9-10 years.  She has frequent headaches and earaches.   PAST MEDICAL HISTORY:  1. Asthma diagnosed clinically as indicated above.  2. Allergic rhinitis and sinus trouble.  3. Chronic headaches that she believes are migraine-type headaches.  4. Degenerative disc disease with back surgery x3.  5. Hysterectomy in March 2005.  6. Obesity.  7. 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   ALLERGIES:  1. ROBAXIN.  2. IBUPROFEN.   MEDICATIONS:  1. Ventolin  HFA every 4 hours as needed for shortness of breath.  2. Chlorzoxazone 500 mg as needed.  3. Hydrocodone/APAP 10 per 325 mg as needed.   SOCIAL HISTORY:  The patient is married.  She lives with her husband.  She  was originally born in [place] in [place].  She moved to the [place] as  a child.  She then lived in [place] until 1991, when she moved to [place].  She has worked in the past as a Passenger transport manager.  She does not have any  significant occupational exposures.  She has never been exposed to TB to her  knowledge.  She has a 9-10 pack year total tobacco history and quit in 1995.  She denies any other drug or alcohol use.   FAMILY HISTORY:  Significant for COPD in her paternal grandmother.  It  should be noted that person was a never-smoker.  Hodgkin's lymphoma in her  mother and hepatoma in her brother.   REVIEW OF SYSTEMS:  As per the HPI.   PHYSICAL EXAMINATION:  GENERAL:  This is an obese woman who is comfortable  on room air.  She interacts appropriately.  VITAL SIGNS:  Her weight is 298 pounds, temperature 97.9, blood pressure  148/92, pulse  84, SpO2 97% on room air.  HEENT:  Oropharynx is clear.  Pupils are equal, round and reactive to light.  She has some rare inspiratory squeaks, but no overt stridor.  LUNGS:  Clear to auscultation bilaterally without wheezing or crackles.  HEART:  Regular rate and rhythm without murmur, rub or gallop.  ABDOMEN:  Soft, obese, nontender, with positive bowel sounds.  EXTREMITIES:  No cyanosis, clubbing or edema.  NEUROLOGIC:  She has a grossly nonfocal exam.   IMPRESSION:  1. 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  She certainly may also have superimposed      asthma, but she has not responded in the past to Advair and Singulair,      which would be good initial therapy.  2. Obesity.  3. Degenerative disc disease, status post multiple back surgeries.   PLANS:  1. Full pulmonary function testing to confirm whether there is air flow      limitation present.  2. Screening PA and lateral chest x-ray.  3. I will not change her medications at this time, but we will review her      studies as soon as they are completed and plan any further testing and      therapy.            ______________________________  Leslye Peer, MD      RSB/MedQ  DD:  09/23/2006  DT:  09/26/2006  Job #:  [id number]   cc:   Windle Guard, M.D.

## 2011-05-28 ENCOUNTER — Other Ambulatory Visit: Payer: Self-pay | Admitting: Neurosurgery

## 2011-05-28 DIAGNOSIS — M545 Low back pain: Secondary | ICD-10-CM

## 2011-05-28 DIAGNOSIS — M549 Dorsalgia, unspecified: Secondary | ICD-10-CM

## 2011-06-11 ENCOUNTER — Ambulatory Visit
Admission: RE | Admit: 2011-06-11 | Discharge: 2011-06-11 | Disposition: A | Discharge status: Home / Self Care | Payer: Medicare Other | Source: Ambulatory Visit | Attending: Neurosurgery | Admitting: Neurosurgery

## 2011-06-11 DIAGNOSIS — M545 Low back pain: Secondary | ICD-10-CM

## 2011-06-11 DIAGNOSIS — M549 Dorsalgia, unspecified: Secondary | ICD-10-CM

## 2011-06-11 MED ORDER — GADOBENATE DIMEGLUMINE 529 MG/ML IV SOLN
20.0000 mL | Freq: Once | INTRAVENOUS | Status: AC | PRN
  Administered 2011-06-11: 20 mL via INTRAVENOUS

## 2011-08-14 ENCOUNTER — Other Ambulatory Visit: Payer: Self-pay | Admitting: Neurosurgery

## 2011-08-14 DIAGNOSIS — M549 Dorsalgia, unspecified: Secondary | ICD-10-CM

## 2011-09-02 MED ORDER — DIAZEPAM 2 MG PO TABS
10.0000 mg | ORAL_TABLET | Freq: Once | ORAL | Status: AC
  Administered 2011-09-03: 5 mg via ORAL

## 2011-09-03 ENCOUNTER — Ambulatory Visit
Admission: RE | Admit: 2011-09-03 | Discharge: 2011-09-03 | Disposition: A | Discharge status: Home / Self Care | Payer: Medicare Other | Source: Ambulatory Visit | Attending: Neurosurgery | Admitting: Neurosurgery

## 2011-09-03 DIAGNOSIS — IMO0002 Reserved for concepts with insufficient information to code with codable children: Secondary | ICD-10-CM

## 2011-09-03 DIAGNOSIS — M549 Dorsalgia, unspecified: Secondary | ICD-10-CM

## 2011-09-03 MED ORDER — ONDANSETRON HCL 4 MG/2ML IJ SOLN
4.0000 mg | Freq: Once | INTRAMUSCULAR | Status: AC
  Administered 2011-09-03: 4 mg via INTRAMUSCULAR

## 2011-09-03 MED ORDER — IOHEXOL 300 MG/ML  SOLN
10.0000 mL | Freq: Once | INTRAMUSCULAR | Status: AC | PRN
  Administered 2011-09-03: 10 mL via INTRATHECAL

## 2011-09-03 MED ORDER — MEPERIDINE HCL 100 MG/ML IJ SOLN
75.0000 mg | Freq: Once | INTRAMUSCULAR | Status: AC
  Administered 2011-09-03: 75 mg via INTRAMUSCULAR

## 2011-09-03 NOTE — Progress Notes (Signed)
Resting quietly w/o complaint.  States headache and earache and low back pain are much better.

## 2011-09-10 ENCOUNTER — Encounter (HOSPITAL_COMMUNITY)
Admission: RE | Admit: 2011-09-10 | Discharge: 2011-09-10 | Disposition: A | Discharge status: Home / Self Care | Payer: Medicare Other | Source: Ambulatory Visit | Attending: Neurosurgery | Admitting: Neurosurgery

## 2011-09-10 LAB — BASIC METABOLIC PANEL
Calcium: 9.9 mg/dL (ref 8.4–10.5)
GFR calc non Af Amer: >60 mL/min (ref >60)
Potassium: 4.6 mEq/L (ref 3.5–5.1)
Sodium: 139 mEq/L (ref 135–145)

## 2011-09-10 LAB — CBC
Hemoglobin: 14.8 g/dL (ref 12.0–15.0)
MCH: 30 pg (ref 26.0–34.0)
MCHC: 34.1 g/dL (ref 30.0–36.0)
Platelets: 215 10*3/uL (ref 150–400)
RBC: 4.93 MIL/uL (ref 3.87–5.11)

## 2011-09-10 LAB — SURGICAL PCR SCREEN
MRSA, PCR: NEGATIVE
Staphylococcus aureus: NEGATIVE

## 2011-09-13 ENCOUNTER — Other Ambulatory Visit (HOSPITAL_COMMUNITY): Payer: Medicare Other

## 2011-09-14 ENCOUNTER — Ambulatory Visit (HOSPITAL_COMMUNITY): Payer: Medicare Other

## 2011-09-14 ENCOUNTER — Ambulatory Visit (HOSPITAL_COMMUNITY)
Admission: RE | Admit: 2011-09-14 | Discharge: 2011-09-14 | Disposition: A | Discharge status: Home / Self Care | Payer: Medicare Other | Source: Ambulatory Visit | Attending: Neurosurgery | Admitting: Neurosurgery

## 2011-09-14 DIAGNOSIS — Z01812 Encounter for preprocedural laboratory examination: Secondary | ICD-10-CM | POA: Insufficient documentation

## 2011-09-14 DIAGNOSIS — M5124 Other intervertebral disc displacement, thoracic region: Principal | ICD-10-CM | POA: Insufficient documentation

## 2011-09-14 LAB — TYPE AND SCREEN: ABO/RH(D): A POS

## 2011-09-27 LAB — CBC
HCT: 44.6
Hemoglobin: 12.5
MCHC: 33.6
MCHC: 33.6
MCV: 89.2
MCV: 89.3
Platelets: 227
RBC: 4.16
RDW: 13.1
RDW: 13.4

## 2011-09-27 LAB — BASIC METABOLIC PANEL
CO2: 29
Chloride: 102
Creatinine, Ser: 0.82
GFR calc Af Amer: >60
Glucose, Bld: 108 — ABNORMAL HIGH

## 2011-09-27 LAB — HEPATIC FUNCTION PANEL
Albumin: 3.7
Alkaline Phosphatase: 46
Bilirubin, Direct: 0.1
Total Bilirubin: 0.8

## 2011-09-27 LAB — TYPE AND SCREEN: ABO/RH(D): A POS

## 2011-10-12 NOTE — Op Note (Signed)
NAMELENORA, GOMES NO.:  [id number]  MEDICAL RECORD NO.:  [id number]  LOCATION:  2899                         FACILITY:  [facility]  PHYSICIAN:  Kathaleen Maser. Deuntae Kocsis, M.D.    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  PROCEDURE NAME:  Left T12-L1 transpedicular microdiskectomy.  SURGEON:  Kathaleen Maser. Danny Yackley, MD  ASSISTANT:  None.  ANESTHESIA:  General oral endotracheal.  INDICATIONS:  Ms. Chapdelaine is a 55 year old female with history of the left lower thoracic pain with radiation to her left lower extremity. Workup demonstrates evidence of a left paracentral disk herniation at T11-12 with compression and distortion of the left lateral aspect of the lower thoracic spinal cord.  The patient has failed conservative management and presents now for transpedicular microdiskectomy in hopes of improving her symptoms.  OPERATIVE NOTE:  The patient was brought to the operating room and placed on operating table in supine position.  After adequate level of anesthesia was achieved, the patient was placed prone onto Wilson frame and appropriately padded.  The patient's lumbar region was prepped and draped in sterilely.  A 10-blade was used to make a curvilinear skin incision overlying the T11-12 interspace.  This was carried down sharply in the midline.  The subperiosteal dissection was then performed exposing the lamina and facet joints of T12 and T12.  Deep self- retaining retractor was placed.  Intraoperative x-ray was used and level was confirmed.  A laminectomy was then performed using Leksell rongeurs and Kerrison rongeurs to remove the inferior aspect of the lamina of T11, the medial aspect of T11-12 facet joint and a superior  rim of the T12 lamina.  Microscope was then brought onto the field and used through the remainder of diskectomy.  Ligamentum flavum was resected using Kerrison rongeurs.  The underlying thecal sac and T12 nerve root were identified and the T11 nerve root was identified.  Pedicle was removed using the high-speed drill, drilling down flush and then subsequently entered the vertebral body of T12.  The disk space was then entered through this transpedicular approach.  Disk space was then further incised.  Disk material was then removed laterally using pituitary rongeurs.  Using Epstein curettes and down-pushing SD dissectors, the disk herniation was then pushed into the cavity and completely resected using pituitary rongeurs.  This accomplished the diskectomy without having to perform retraction or injury to the thecal sac and underlying spinal cord.  At this point, a very thorough diskectomy had been achieved.  There was no injury to thecal sac or nerve roots.  Wound was then irrigated out with antibiotic solution.  Gelfoam was placed topically for hemostasis and found to be good.  Microscope and retractor system were removed.  Hemostasis of muscle was achieved with electrocautery.  The wound was then closed in layers of Vicryl suture. Steri-Strips and sterile dressing were applied.  There were no intraoperative complications.  The patient tolerated the procedure well and she returned  to the recovery room postoperatively.          ______________________________ Kathaleen Maser Josilyn Shippee, M.D.     HAP/MEDQ  D:  09/14/2011  T:  09/14/2011  Job:  [id number]  Electronically Signed by Julio Sicks M.D. on 10/12/2011 11:36:17 AM

## 2011-11-20 IMAGING — CR DG MYELOGRAM 2+ REGIONS
11 of 24 series · 11 of 24 positions shown · IV contrast (omnipaque)
Comparison: MRI of the thoracic and lumbar spine 06/11/2011.

CLINICAL DATA: Thoracic and lumbar spine pain.  Status post lumbar
fusion.  Pain is worse on the left.

MYELOGRAM INJECTION
TECHNIQUE: Informed consent was obtained from the patient prior to
the procedure, including potential complications of headache,
allergy, infection and pain.  A timeout procedure was performed.
With the patient prone, the lower back was prepped with Betadine.
1% Lidocaine was used for local anesthesia. Following review of the
patient's recent lumbar spine MRI and identification of the conus
medullaris, lumbar puncture was performed at the right paramidline
L1-2 level using a 22 gauge needle with return of clear CSF.  10 ml
of Omnipaque 400was injected into the subarachnoid space .
TECHNIQUE: Following injection of intrathecal Omnipaque contrast,
spine imaging in multiple projections was performed using
fluoroscopy.
Fluoroscopy Time: 1.46 minutes.
TECHNIQUE: CT imaging of the thoracic spine was performed after
intrathecal contrast administration.  Multiplanar CT image
reconstructions were also generated.
TECHNIQUE: CT imaging of the lumbar spine was performed after

[[hospital]]
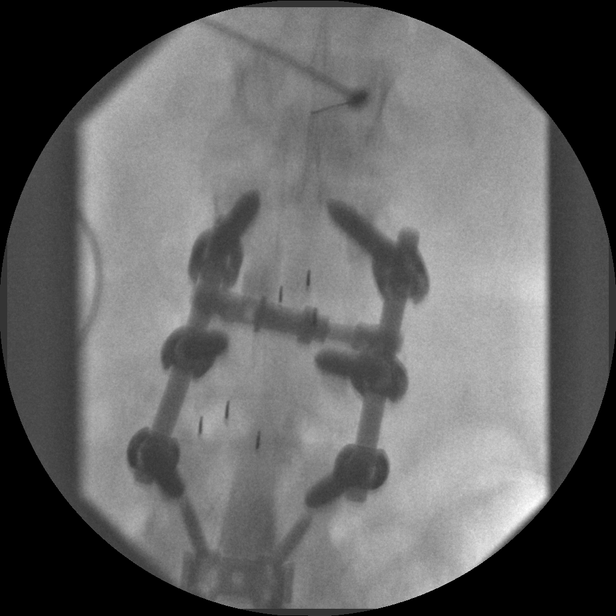

[myelogram  white (1 of 10)]
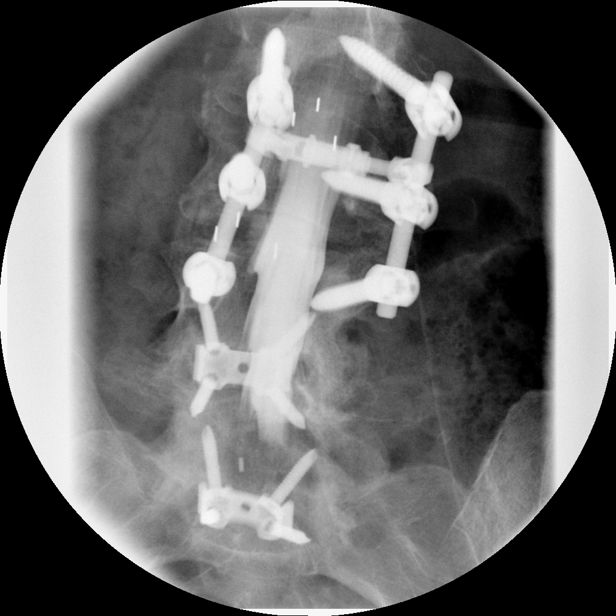

[myelogram  white (2 of 10)]
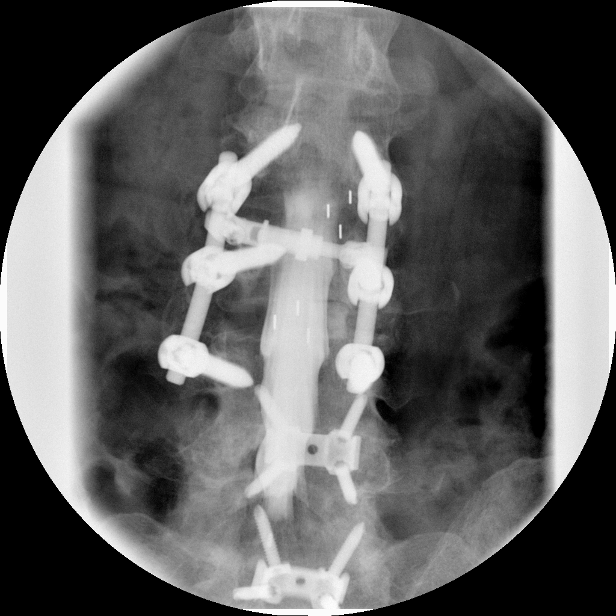

[myelogram  white (3 of 10)]
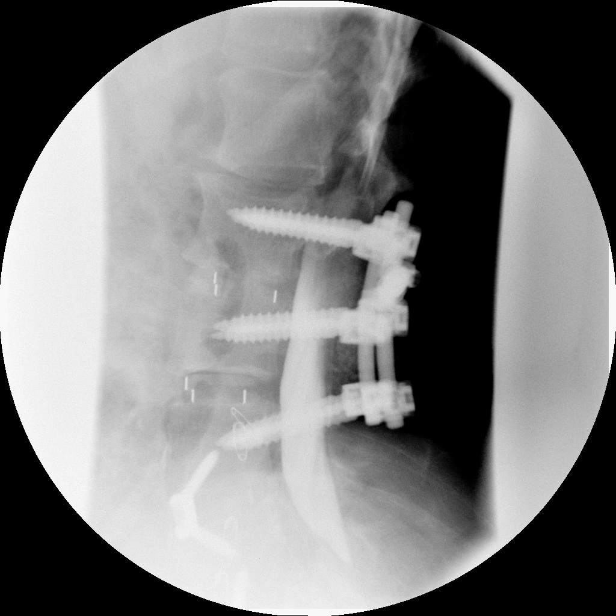

[myelogram  white (4 of 10)]
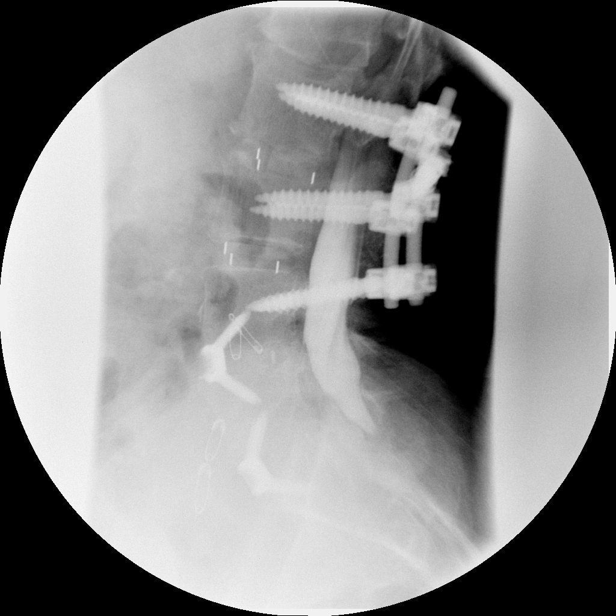

[myelogram  white (5 of 10)]
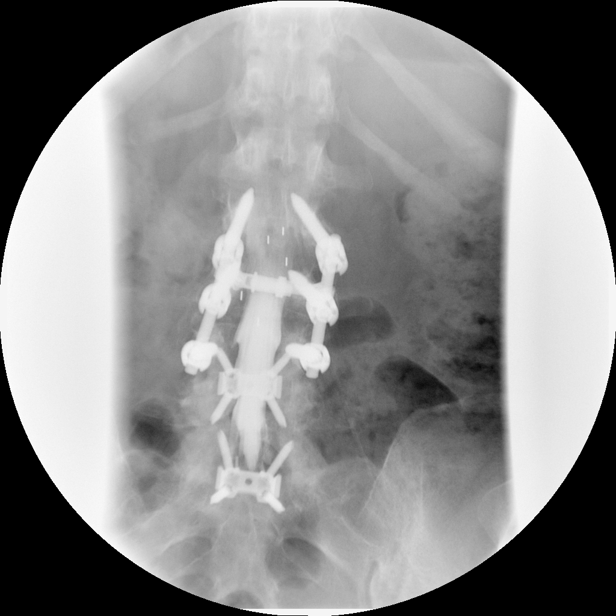

[myelogram  white (6 of 10)]
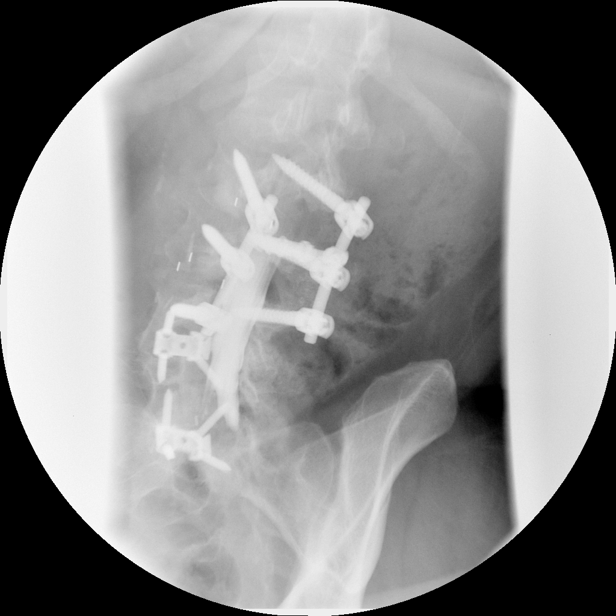

[myelogram  white (7 of 10)]
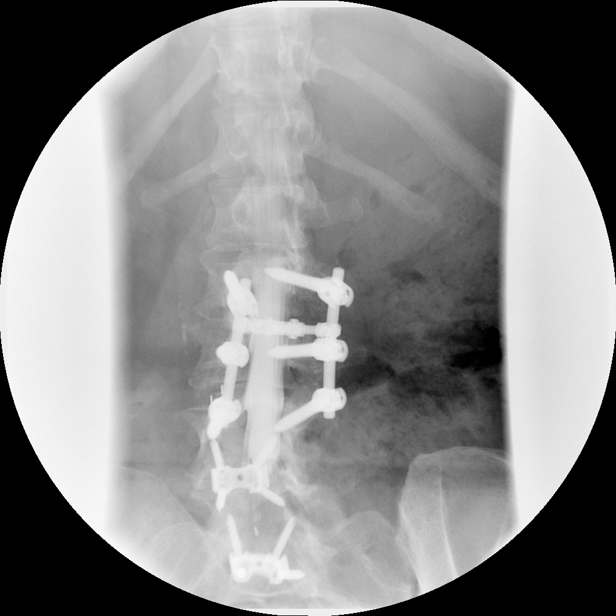

[myelogram  white (8 of 10)]
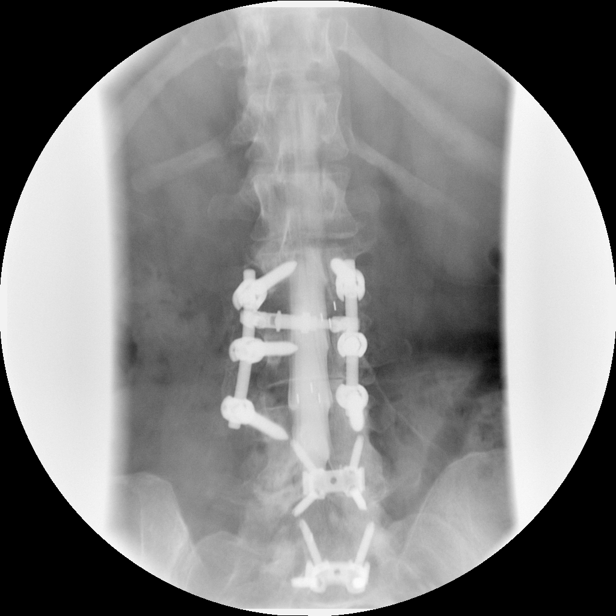

[myelogram  white (9 of 10)]
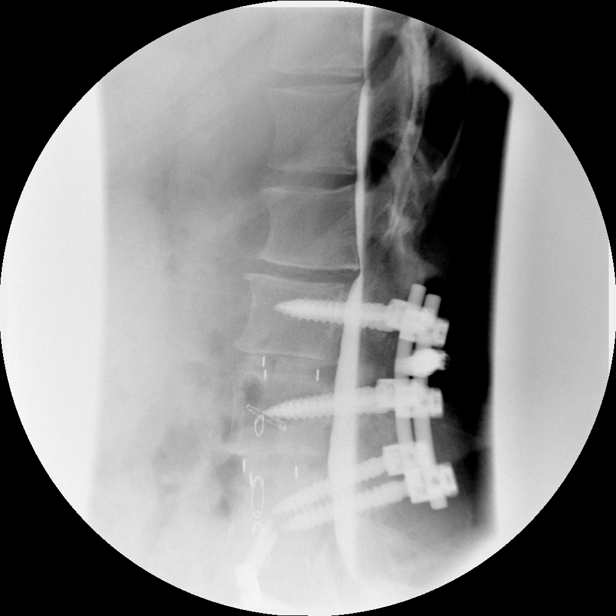

[myelogram  white (10 of 10)]
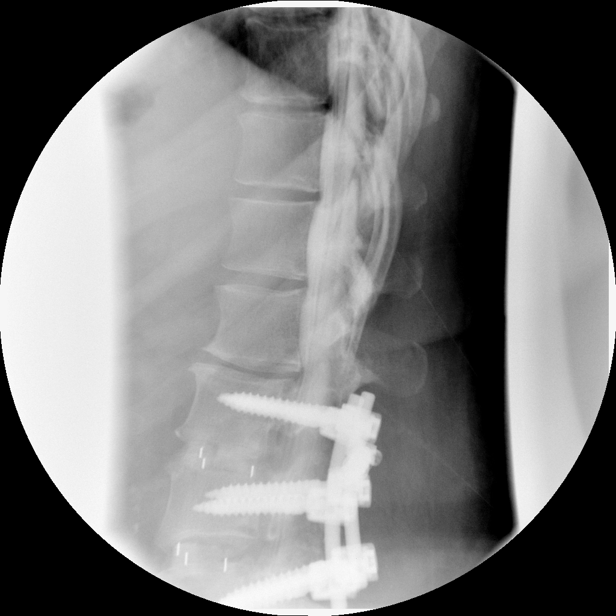

[11 of 24 positions shown; findings below may reference images not displayed]

IMPRESSION: Successful injection of  intrathecal contrast for myelography.

MYELOGRAM THORACIC AND LUMBAR
FINDINGS: The patient is status post anterior lumbar fusion at L4-5
and L5-S1.  The patient is status post PLIF at L2-3 and L3-4.  The
hardware is intact.  The lumbar nerve roots fill normally on both
sides.  No focal stenosis is evident through the fused segments.

Retrolisthesis at L1-2, the adjacent level, measures 5-6 mm.  There
is central canal narrowing at that level.  There is no significant
disc disease at L1-2.

Focal disc bulging is present at T11-12.  There is some disc
bulging in the upper thoracic spine as on the prior MRI.  Please
see the CT report below.

The upright images of the lumbar spine demonstrate slight increase
in the disc bulging and mild inferior extrusion at L2-3.  The disc
bulge is less prominent with flexion and slightly more prominent in
extension.  There is no abnormal movement through the fused
segments.
IMPRESSION: 1.
1.  Retrolisthesis and slight uncovering of the disc at L1-2, the
adjacent level.
2.  Mild central canal narrowing at L1-2.
3.  The disc bulging is more prominent upon standing and with
extension.  There is slight reduction in the disc bulge with
flexion.
4.  Status post fusion at L2-3, L3-4, L4-5, and L5-S1.


CT MYELOGRAPHY THORACIC SPINE
FINDINGS: The thoracic spine is imaged from C6-7 through L1.
Minimal bilateral atelectasis is evident.  The lungs are otherwise
clear.  The visualized mediastinum is unremarkable.

 The vertebral body heights and alignment are maintained.

T1-2:  Negative.

T2-3:  Negative.

T3-4:  A shallow central disc protrusion is present.  There is no
significant stenosis.

T4-5:  Negative.

T5-6:  A right paracentral disc protrusion partially effaces the
ventral CSF without significant stenosis. The disc potentially
contacts the ventral surface of the cord.

T6-7:  A right paracentral disc protrusion potentially contacts the
ventral surface of the cord.  The foramina are patent.

T7-8:  A right paracentral disc protrusion potentially contacts the
ventral surface of the cord.  Mild facet hypertrophy is present.
There is no significant stenosis.

T8-9:  A slight right paracentral protrusion is evident.

T9-10:  No significant disc herniation or stenosis.  Mild facet
hypertrophy is worse on the left.

T10-11:  No significant disc herniation or stenosis is present.

T11-12:  A left paracentral disc protrusion is present.  This is
associated with a posterior Schmorl's node on the left.  There is
contact and slight distortion of the left side of the cord as seen
on the MRI.

T12-L1:  A shallow left paracentral protrusion is present.  This
appears associated with a posterior Schmorl's node.  There is
partial effacement of CSF and anterior nerve roots.  No significant
stenosis is present.
IMPRESSION: 1.  The most significant disease is at T11-12 where a left
paracentral disc protrusion contacts and distorts the ventral
surface of the cord.
2.  Shallow left paracentral protrusion at T12-L1 without
significant stenosis.
3.  Shallow right paracentral disc protrusions at multiple levels
from T5-6 through T9-10 without significant stenosis. There is
potential contact of the ventral surface of the cord at T5-6, T6-7,
and T7-8
4.  Shallow central disc protrusion at T3-4 without significant
stenosis.


CT MYELOGRAPHY LUMBAR SPINE
FINDINGS: Lumbar spine is imaged from T12-S2.  The patient is
status post previous anterior fusion at L4-5 and L5-S1.  Bridging
bone is mature.  The the patient is status post L2-3 and L3-4 PLIF.
Disc spacers are in place.  There is some bridging bone at L2-3.
There appears to be some lucency above the disc spacer at L3-4
without definite bridging bone across this disc space.  There may
be some gas in the disc is well.

The retrolisthesis at L1-2 measures 5 mm.  There is uncovering of
the disc as on the previous study.  Leftward disc bulging is
present with left to mild left lateral recess narrowing.  Mild left
foraminal narrowing is also present.  The right foramen appears
patent.

L2-3:  The patient is status post fusion.  The central canal and
foramina are widely patent.

L3-4:  The patient is status post fusion.  Please see above
discussion.  There does appear to be incorporation of bone graft
posteriorly.  A wide laminectomy was performed.  No residual
stenosis is evident.

L4-5:  The patient is status post left laminectomy.  Moderate facet
hypertrophy is evident.  This leads to mild osseous foraminal
narrowing, this leads to mild right-sided osseous foraminal
narrowing.  The left foramen is patent.

L5-S1:  Posterior osteophyte formation is present at the fused
level.  This does not clearly contact the nerve roots in the
lateral recess.  There is mild osseous foraminal narrowing
bilaterally.
IMPRESSION: 1.  5 mm retrolisthesis at L1-2 with uncovering of the disc and
leftward disc bulging.
2.  Mild left lateral recess and foraminal stenosis at L2-3 is more
apparent than on the previous study.
3.  Status post fusion at L2-3, L3-4, L4-5, and L5-S1.
4.  No residual stenosis at L2-3 or L3-4.
5.  Mild osseous foraminal stenosis on the right at L4-5.
6.  Mild osseous foraminal narrowing bilaterally at L5-S1.
7.  Question of incomplete fusion across the disc space at L3-4.
There does appear to be incorporation of graft material posteriorly
at this level.

## 2012-08-31 ENCOUNTER — Other Ambulatory Visit: Payer: Self-pay | Admitting: Family Medicine

## 2012-08-31 DIAGNOSIS — M25511 Pain in right shoulder: Secondary | ICD-10-CM

## 2012-09-03 ENCOUNTER — Other Ambulatory Visit: Payer: Medicare Other

## 2012-09-07 ENCOUNTER — Ambulatory Visit
Admission: RE | Admit: 2012-09-07 | Discharge: 2012-09-07 | Disposition: A | Discharge status: Home / Self Care | Payer: Medicare Other | Source: Ambulatory Visit | Attending: Family Medicine | Admitting: Family Medicine

## 2012-09-07 DIAGNOSIS — M25511 Pain in right shoulder: Secondary | ICD-10-CM

## 2012-10-11 HISTORY — PX: SHOULDER ARTHROSCOPY W/ ROTATOR CUFF REPAIR: SHX2400

## 2012-12-05 ENCOUNTER — Encounter (INDEPENDENT_AMBULATORY_CARE_PROVIDER_SITE_OTHER): Payer: Self-pay | Admitting: General Surgery

## 2012-12-06 ENCOUNTER — Encounter (INDEPENDENT_AMBULATORY_CARE_PROVIDER_SITE_OTHER): Payer: Self-pay | Admitting: General Surgery

## 2012-12-06 ENCOUNTER — Ambulatory Visit (INDEPENDENT_AMBULATORY_CARE_PROVIDER_SITE_OTHER): Payer: Medicare Other | Admitting: General Surgery

## 2012-12-06 VITALS — BP 118/86 | HR 84 | Temp 97.4°F | Resp 18 | Ht 65.0 in | Wt 284.0 lb

## 2012-12-06 DIAGNOSIS — K432 Incisional hernia without obstruction or gangrene: Secondary | ICD-10-CM

## 2012-12-06 NOTE — Progress Notes (Signed)
Subjective:     Patient ID: NOAM FRANZEN, female   DOB: April 06, 1956, 56 y.o.   MRN: [id number]  HPI The patient is a 56 year old female with multiple abdominal surgeries in 2009. Patient was seen by home health care and was concerned about incarceration of hernia since then. Patient states she's spoke with the particular surgeon at the time secondary to multiple infections hernia with a hernia repair wa put on hold. The patient has never had any signs or symptoms of incarceration or strangulation.  Review of Systems  Constitutional: Negative.   Respiratory: Negative.   Cardiovascular: Negative.   Gastrointestinal: Negative.        Objective:   Physical Exam  Constitutional: She appears well-developed and well-nourished.  HENT:  Head: Normocephalic.  Eyes: Conjunctivae normal are normal. Pupils are equal, round, and reactive to light.  Neck: Normal range of motion. Neck supple.  Cardiovascular: Normal rate and normal heart sounds.   Pulmonary/Chest: Effort normal.  Abdominal: Soft. Bowel sounds are normal. She exhibits no mass. There is no rebound and no guarding. Hernia confirmed negative in the ventral area (difficult to determine).         Assessment:     The patient is a 55 year old female with a likely incisional hernia. I discussed the likelihood of needing a CT scan to evaluate for possible hernia. Also discussed the likelihood of recurrence of hernia secondary to her weight. She was concerned mainly of possible incarceration and we discussed the signs and symptoms of incarceration it would require a visit to the ER. The patient like to discuss weight loss surgery bariatric surgeon mainly for weight loss as opposed to hernia surgery.    Plan:     1. We'll refer her to bariatric surgeons for evaluation.  2. Patient was instructed on present symptoms of incarceration the need to proceed ED should these occur.

## 2013-01-04 ENCOUNTER — Emergency Department (HOSPITAL_COMMUNITY): Payer: Medicare Other

## 2013-01-04 ENCOUNTER — Inpatient Hospital Stay (HOSPITAL_COMMUNITY)
Admission: EM | Admit: 2013-01-04 | Discharge: 2013-01-20 | DRG: 417 | Disposition: A | Discharge status: Home / Self Care | Payer: Medicare Other | Attending: Internal Medicine | Admitting: Internal Medicine

## 2013-01-04 ENCOUNTER — Encounter (HOSPITAL_COMMUNITY): Payer: Self-pay | Admitting: Emergency Medicine

## 2013-01-04 DIAGNOSIS — K859 Acute pancreatitis without necrosis or infection, unspecified: Secondary | ICD-10-CM

## 2013-01-04 DIAGNOSIS — K820 Obstruction of gallbladder: Secondary | ICD-10-CM | POA: Diagnosis present

## 2013-01-04 DIAGNOSIS — Z87891 Personal history of nicotine dependence: Secondary | ICD-10-CM

## 2013-01-04 DIAGNOSIS — D72829 Elevated white blood cell count, unspecified: Secondary | ICD-10-CM | POA: Diagnosis present

## 2013-01-04 DIAGNOSIS — I959 Hypotension, unspecified: Secondary | ICD-10-CM

## 2013-01-04 DIAGNOSIS — R109 Unspecified abdominal pain: Secondary | ICD-10-CM

## 2013-01-04 DIAGNOSIS — R05 Cough: Secondary | ICD-10-CM

## 2013-01-04 DIAGNOSIS — J309 Allergic rhinitis, unspecified: Secondary | ICD-10-CM

## 2013-01-04 DIAGNOSIS — K8050 Calculus of bile duct without cholangitis or cholecystitis without obstruction: Secondary | ICD-10-CM | POA: Diagnosis present

## 2013-01-04 DIAGNOSIS — J9600 Acute respiratory failure, unspecified whether with hypoxia or hypercapnia: Secondary | ICD-10-CM | POA: Diagnosis present

## 2013-01-04 DIAGNOSIS — J90 Pleural effusion, not elsewhere classified: Secondary | ICD-10-CM | POA: Diagnosis not present

## 2013-01-04 DIAGNOSIS — K5900 Constipation, unspecified: Secondary | ICD-10-CM | POA: Diagnosis present

## 2013-01-04 DIAGNOSIS — Z79899 Other long term (current) drug therapy: Secondary | ICD-10-CM

## 2013-01-04 DIAGNOSIS — J9819 Other pulmonary collapse: Secondary | ICD-10-CM | POA: Diagnosis not present

## 2013-01-04 DIAGNOSIS — K831 Obstruction of bile duct: Secondary | ICD-10-CM | POA: Diagnosis present

## 2013-01-04 DIAGNOSIS — J9811 Atelectasis: Secondary | ICD-10-CM | POA: Diagnosis not present

## 2013-01-04 DIAGNOSIS — E875 Hyperkalemia: Secondary | ICD-10-CM | POA: Diagnosis present

## 2013-01-04 DIAGNOSIS — IMO0002 Reserved for concepts with insufficient information to code with codable children: Secondary | ICD-10-CM

## 2013-01-04 DIAGNOSIS — E669 Obesity, unspecified: Secondary | ICD-10-CM | POA: Diagnosis present

## 2013-01-04 DIAGNOSIS — R51 Headache: Secondary | ICD-10-CM

## 2013-01-04 DIAGNOSIS — R0602 Shortness of breath: Secondary | ICD-10-CM

## 2013-01-04 DIAGNOSIS — K851 Biliary acute pancreatitis without necrosis or infection: Secondary | ICD-10-CM | POA: Diagnosis present

## 2013-01-04 DIAGNOSIS — Z9079 Acquired absence of other genital organ(s): Secondary | ICD-10-CM

## 2013-01-04 HISTORY — DX: Migraine, unspecified, not intractable, without status migrainosus: G43.909

## 2013-01-04 HISTORY — DX: Pneumonia, unspecified organism: J18.9

## 2013-01-04 HISTORY — DX: Acute pancreatitis without necrosis or infection, unspecified: K85.90

## 2013-01-04 HISTORY — DX: Hemorrhage of anus and rectum: K62.5

## 2013-01-04 LAB — CBC WITH DIFFERENTIAL/PLATELET
Basophils Absolute: 0 10*3/uL (ref 0.0–0.1)
Basophils Relative: 0 % (ref 0–1)
Eosinophils Absolute: 0.2 10*3/uL (ref 0.0–0.7)
Eosinophils Relative: 3 % (ref 0–5)
MCH: 28.7 pg (ref 26.0–34.0)
MCV: 89 fL (ref 78.0–100.0)
Platelets: 220 10*3/uL (ref 150–400)
RDW: 13 % (ref 11.5–15.5)
WBC: 8.8 10*3/uL (ref 4.0–10.5)

## 2013-01-04 LAB — COMPREHENSIVE METABOLIC PANEL
ALT: 274 U/L — ABNORMAL HIGH (ref 0–35)
AST: 218 U/L — ABNORMAL HIGH (ref 0–37)
Albumin: 3.8 g/dL (ref 3.5–5.2)
Calcium: 10.4 mg/dL (ref 8.4–10.5)
GFR calc Af Amer: >90 mL/min (ref >90)
Sodium: 140 mEq/L (ref 135–145)
Total Protein: 7.5 g/dL (ref 6.0–8.3)

## 2013-01-04 LAB — URINALYSIS, ROUTINE W REFLEX MICROSCOPIC
Glucose, UA: NEGATIVE mg/dL
Hgb urine dipstick: NEGATIVE
Specific Gravity, Urine: 1.015 (ref 1.005–1.030)
Urobilinogen, UA: 1 mg/dL (ref 0.0–1.0)

## 2013-01-04 MED ORDER — HYDROMORPHONE HCL PF 1 MG/ML IJ SOLN
1.0000 mg | Freq: Once | INTRAMUSCULAR | Status: AC
  Administered 2013-01-04: 1 mg via INTRAVENOUS
  Filled 2013-01-04: qty 1

## 2013-01-04 MED ORDER — FENTANYL CITRATE 0.05 MG/ML IJ SOLN
100.0000 ug | Freq: Once | INTRAMUSCULAR | Status: AC
  Administered 2013-01-04: 100 ug via INTRAVENOUS
  Filled 2013-01-04: qty 2

## 2013-01-04 MED ORDER — GABAPENTIN 300 MG PO CAPS
300.0000 mg | ORAL_CAPSULE | Freq: Three times a day (TID) | ORAL | Status: DC
  Administered 2013-01-05 – 2013-01-08 (×6): 300 mg via ORAL
  Filled 2013-01-04 (×13): qty 1

## 2013-01-04 MED ORDER — HYDROMORPHONE HCL PF 1 MG/ML IJ SOLN
INTRAMUSCULAR | Status: AC
  Filled 2013-01-04: qty 2

## 2013-01-04 MED ORDER — ONDANSETRON HCL 4 MG/2ML IJ SOLN
4.0000 mg | Freq: Once | INTRAMUSCULAR | Status: AC
  Administered 2013-01-04: 4 mg via INTRAVENOUS
  Filled 2013-01-04: qty 2

## 2013-01-04 MED ORDER — ONDANSETRON HCL 4 MG PO TABS: 4.0000 mg | ORAL_TABLET | Freq: Four times a day (QID) | ORAL | Status: DC | PRN

## 2013-01-04 MED ORDER — SODIUM CHLORIDE 0.9 % IV BOLUS (SEPSIS)
1000.0000 mL | Freq: Once | INTRAVENOUS | Status: AC
  Administered 2013-01-04: 1000 mL via INTRAVENOUS

## 2013-01-04 MED ORDER — ACETAMINOPHEN 325 MG PO TABS
650.0000 mg | ORAL_TABLET | Freq: Four times a day (QID) | ORAL | Status: DC | PRN
  Administered 2013-01-10 – 2013-01-14 (×8): 650 mg via ORAL
  Filled 2013-01-04 (×8): qty 2

## 2013-01-04 MED ORDER — ONDANSETRON HCL 4 MG/2ML IJ SOLN: 4.0000 mg | Freq: Three times a day (TID) | INTRAMUSCULAR | Status: DC | PRN

## 2013-01-04 MED ORDER — ACETAMINOPHEN 650 MG RE SUPP
650.0000 mg | Freq: Four times a day (QID) | RECTAL | Status: DC | PRN
  Administered 2013-01-06: 650 mg via RECTAL
  Filled 2013-01-04: qty 1

## 2013-01-04 MED ORDER — HYDROMORPHONE HCL PF 1 MG/ML IJ SOLN: 1.0000 mg | INTRAMUSCULAR | Status: DC | PRN

## 2013-01-04 MED ORDER — HYDROMORPHONE HCL PF 1 MG/ML IJ SOLN
1.0000 mg | INTRAMUSCULAR | Status: DC | PRN
  Administered 2013-01-05: 2 mg via INTRAVENOUS

## 2013-01-04 MED ORDER — FENTANYL CITRATE 0.05 MG/ML IJ SOLN
75.0000 ug | Freq: Once | INTRAMUSCULAR | Status: AC
  Administered 2013-01-04: 75 ug via INTRAVENOUS
  Filled 2013-01-04: qty 2

## 2013-01-04 MED ORDER — PANTOPRAZOLE SODIUM 40 MG IV SOLR
40.0000 mg | Freq: Every day | INTRAVENOUS | Status: DC
  Administered 2013-01-05 – 2013-01-13 (×9): 40 mg via INTRAVENOUS
  Filled 2013-01-04 (×11): qty 40

## 2013-01-04 MED ORDER — SODIUM CHLORIDE 0.9 % IV SOLN: INTRAVENOUS | Status: DC

## 2013-01-04 MED ORDER — ONDANSETRON HCL 4 MG/2ML IJ SOLN
4.0000 mg | Freq: Four times a day (QID) | INTRAMUSCULAR | Status: DC | PRN
  Administered 2013-01-05 – 2013-01-08 (×4): 4 mg via INTRAVENOUS
  Filled 2013-01-04 (×4): qty 2

## 2013-01-04 NOTE — ED Notes (Signed)
Paged [facility] to [phone]

## 2013-01-04 NOTE — ED Notes (Signed)
Pt transported to [facility]

## 2013-01-04 NOTE — ED Provider Notes (Signed)
History     CSN: [id number]  Arrival date & time 01/04/13  1716   First MD Initiated Contact with Patient 01/04/13 1740      Chief Complaint  Patient presents with  . Abdominal Pain    (Consider location/radiation/quality/duration/timing/severity/associated sxs/prior treatment) Patient is a 57 y.o. female presenting with abdominal pain. The history is provided by the patient. No language interpreter was used.  Abdominal Pain The primary symptoms of the illness include abdominal pain, nausea and vomiting. The primary symptoms of the illness do not include fever, shortness of breath, diarrhea or dysuria. The current episode started more than 2 days ago. The onset of the illness was gradual. The problem has been rapidly worsening.  The abdominal pain is located in the RUQ and epigastric region. The abdominal pain radiates to the back. The severity of the abdominal pain is 10/10. The abdominal pain is relieved by nothing. The abdominal pain is exacerbated by vomiting and eating.  The patient states that she believes she is currently not pregnant. The patient has not had a change in bowel habit. Risk factors for an acute abdominal problem include a history of abdominal surgery. Symptoms associated with the illness do not include chills, constipation or frequency. Significant associated medical issues include gallstones.    Past Medical History  Diagnosis Date  . Arthritis   . Asthma   . Neuromuscular disorder     Past Surgical History  Procedure Date  . Tonsillectomy 1971  . Lipoma removed from back 1976  . Spine surgery 1984  . Tubal ligation 1984  . Knee surgery   . Carpal tunnel release 1998/ and 2001    Family History  Problem Relation Age of Onset  . Cancer Mother     breast/hodgkins    History  Substance Use Topics  . Smoking status: Former Games developer  . Smokeless tobacco: Former Neurosurgeon    Quit date: 12/06/1994  . Alcohol Use: No    OB History    Grav Para Term Preterm  Abortions TAB SAB Ect Mult Living                  Review of Systems  Constitutional: Negative for fever and chills.  HENT: Negative for congestion and sore throat.   Respiratory: Negative for cough and shortness of breath.   Cardiovascular: Negative for chest pain and leg swelling.  Gastrointestinal: Positive for nausea, vomiting and abdominal pain. Negative for diarrhea and constipation.  Genitourinary: Negative for dysuria and frequency.  Skin: Negative for color change and rash.  Neurological: Negative for dizziness and headaches.  Psychiatric/Behavioral: Negative for confusion and agitation.  All other systems reviewed and are negative.    Allergies  Ibuprofen  Home Medications   Current Outpatient Rx  Name  Route  Sig  Dispense  Refill  . OCUVITE PO TABS   Oral   Take 1 tablet by mouth daily.         Marland Kitchen BISACODYL 5 MG PO TBEC   Oral   Take 5 mg by mouth daily as needed. For constipation         . VITAMIN D 1000 UNITS PO TABS   Oral   Take 1,000 Units by mouth daily.         . STOOL SOFTENER PO   Oral   Take 1 capsule by mouth daily as needed. For constipation         . ESTRADIOL 2 MG PO TABS   Oral  Take 2 mg by mouth daily.         Marland Kitchen FLAX SEEDS PO   Oral   Take 1 capsule by mouth daily.          Marland Kitchen GABAPENTIN 300 MG PO CAPS   Oral   Take 300 mg by mouth 3 (three) times daily.         Marland Kitchen GLUCOSAMINE-CHONDROITIN 500-400 MG PO TABS   Oral   Take 1 tablet by mouth 2 (two) times daily.          Marland Kitchen HYDROCODONE-ACETAMINOPHEN 7.5-325 MG PO TABS   Oral   Take 1 tablet by mouth every 6 (six) hours as needed. For pain         . METHOCARBAMOL 750 MG PO TABS   Oral   Take 750 mg by mouth 2 (two) times daily.            BP 135/90  Pulse 96  Temp 97.9 F (36.6 C) (Oral)  Resp 16  SpO2 96%  Physical Exam  Vitals reviewed. Constitutional: She is oriented to person, place, and time. She appears well-developed and well-nourished. No  distress.  HENT:  Head: Normocephalic and atraumatic.  Eyes: EOM are normal. Pupils are equal, round, and reactive to light.  Neck: Normal range of motion. Neck supple.  Cardiovascular: Normal rate and regular rhythm.   Pulmonary/Chest: Effort normal. No respiratory distress.  Abdominal: Soft. She exhibits no distension. There is no hepatosplenomegaly. There is tenderness in the right upper quadrant and epigastric area. There is guarding and positive Murphy's sign. There is no rigidity, no rebound, no CVA tenderness and no tenderness at McBurney's point.    Musculoskeletal: Normal range of motion. She exhibits no edema.  Neurological: She is alert and oriented to person, place, and time.  Skin: Skin is warm and dry.  Psychiatric: She has a normal mood and affect. Her behavior is normal.    ED Course  Procedures (including critical care time)  Labs Reviewed - No data to display No results found.   Date: 01/04/2013  Rate: 72   Rhythm: normal sinus rhythm  QRS Axis: normal  Intervals: normal  ST/T Wave abnormalities: normal  Conduction Disutrbances:none  Narrative Interpretation:   Old EKG Reviewed: none available Results for orders placed during the hospital encounter of 01/04/13  CBC WITH DIFFERENTIAL      Component Value Range   WBC 8.8  4.0 - 10.5 K/uL   RBC 4.98  3.87 - 5.11 MIL/uL   Hemoglobin 14.3  12.0 - 15.0 g/dL   HCT 16.1  09.6 - 04.5 %   MCV 89.0  78.0 - 100.0 fL   MCH 28.7  26.0 - 34.0 pg   MCHC 32.3  30.0 - 36.0 g/dL   RDW 40.9  81.1 - 91.4 %   Platelets 220  150 - 400 K/uL   Neutrophils Relative 75  43 - 77 %   Neutro Abs 6.6  1.7 - 7.7 K/uL   Lymphocytes Relative 16  12 - 46 %   Lymphs Abs 1.4  0.7 - 4.0 K/uL   Monocytes Relative 6  3 - 12 %   Monocytes Absolute 0.6  0.1 - 1.0 K/uL   Eosinophils Relative 3  0 - 5 %   Eosinophils Absolute 0.2  0.0 - 0.7 K/uL   Basophils Relative 0  0 - 1 %   Basophils Absolute 0.0  0.0 - 0.1 K/uL  COMPREHENSIVE  METABOLIC PANEL  Component Value Range   Sodium 140  135 - 145 mEq/L   Potassium 3.9  3.5 - 5.1 mEq/L   Chloride 99  96 - 112 mEq/L   CO2 30  19 - 32 mEq/L   Glucose, Bld 110 (*) 70 - 99 mg/dL   BUN 22  6 - 23 mg/dL   Creatinine, Ser 1.61  0.50 - 1.10 mg/dL   Calcium 09.6  8.4 - 04.5 mg/dL   Total Protein 7.5  6.0 - 8.3 g/dL   Albumin 3.8  3.5 - 5.2 g/dL   AST 409 (*) 0 - 37 U/L   ALT 274 (*) 0 - 35 U/L   Alkaline Phosphatase 152 (*) 39 - 117 U/L   Total Bilirubin 1.7 (*) 0.3 - 1.2 mg/dL   GFR calc non Af Amer >90  >90 mL/min   GFR calc Af Amer >90  >90 mL/min  LIPASE, BLOOD      Component Value Range   Lipase >3000 (*) 11 - 59 U/L  URINALYSIS, ROUTINE W REFLEX MICROSCOPIC      Component Value Range   Color, Urine YELLOW  YELLOW   APPearance CLEAR  CLEAR   Specific Gravity, Urine 1.015  1.005 - 1.030   pH 5.5  5.0 - 8.0   Glucose, UA NEGATIVE  NEGATIVE mg/dL   Hgb urine dipstick NEGATIVE  NEGATIVE   Bilirubin Urine NEGATIVE  NEGATIVE   Ketones, ur NEGATIVE  NEGATIVE mg/dL   Protein, ur NEGATIVE  NEGATIVE mg/dL   Urobilinogen, UA 1.0  0.0 - 1.0 mg/dL   Nitrite NEGATIVE  NEGATIVE   Leukocytes, UA NEGATIVE  NEGATIVE    US Abdomen Complete (Final result)   Result time:01/04/13 2003    Final result by Rad Results In Interface (01/04/13 20:03:46)    Narrative:   *RADIOLOGY REPORT*  Clinical Data: Abdominal pain. Nausea and vomiting. Elevated liver function test. Elevated lipase.  COMPLETE ABDOMINAL ULTRASOUND  Comparison: 01/09/2009 CT.  Findings:  Gallbladder: Multiple gallstones are present. Largest measures 9 mm. These demonstrate typical posterior acoustic shadowing and increased echogenicity. There is no wall thickening. No sonographic Murphy's sign.  Common bile duct: Enlarged, measuring between 12 mm and 13 mm. There is no common duct stone identified.  Liver: Mild intrahepatic biliary ductal dilation is present. No focal mass lesion.  IVC: Appears  normal.  Pancreas: Enlargement of the pancreatic head. Again, no common duct stone is identified in the intrapancreatic distal common bile duct.  Spleen: 8.6 cm. Normal echotexture.  Right Kidney: 12.2 cm. Normal echotexture. Normal central sinus echo complex. No calculi or hydronephrosis.  Left Kidney: 12.3 cm. Normal echotexture. Normal central sinus echo complex. No calculi or hydronephrosis.  Abdominal aorta: 2.4 cm.  IMPRESSION: 1. Cholelithiasis without findings of acute cholecystitis. 2. Dilated common bile duct with mild intrahepatic biliary ductal dilation. There is no visualized common duct stone however with intra and extrahepatic biliary ductal dilation; findings suspicious for a distal common duct stone. In the setting of elevated lipase, gallstone pancreatitis is likely. 3. Enlargement of pancreatic head compatible with pancreatitis.   Original Report Authenticated By: Andreas Newport, M.D.       No diagnosis found.    MDM  Pt w/ PMHx of gallstones, diverticulitis and abdominal abscess presents to ED for abdominal pain. Sx started 4 days ago, intermittent epigastric pain radiating to back a/w nb/nb emesis. Exacerbated w/ PO intake. Acute worsening of sx today. Emesis started yesterday. Denies fever, diarrhea/constipation. Last BM this am.  Normal flatus, no hx of SBO. Hx of multiple abdominal surgeries. No ETOH or tobacco abuse. Not taking NSAIDs, no Hx of CAD, PUD or gastritis.   Exam: in pain,  Afebrile, normotensive, pulse 96 no resp distress or hypoxia. ttp epigastric and RUQ, + murphy, no rebound or guarding, abd soft. BS present.   DDx/Plan: concern for cholecystitis, PUD, gastritis, pancreatitis. Based on hx and exam I doubt: cholangitis, SBO, abdominal abscess, perforated viscus, mesenteric ischemia. Will check RUQ [place], lipase, cmp, cbc, u/a. Will give IVF, dilaudid and zofran. If [place] neg will pursue CT  Course: reassessed, continued pain, given additional  dilaudid. Labs significant for lipase >3000, elevated LFTs and alk phos, no leukocytosis, glucose 110, US reveals cholelithiasis w/out acute cholecystitis. Dilated CBD - concern for gallstone pancreatitis. ECG w/out acute ischemic changes. Called and d/w internal medicine and pt admitted in stable condition.  1. Pancreatitis   2. Abdominal  pain, other specified site            Audelia Hives, MD 01/04/13 2035

## 2013-01-04 NOTE — ED Notes (Signed)
Pt c/o epigastric pain with N/V x 3 days

## 2013-01-04 NOTE — ED Notes (Signed)
Pt

## 2013-01-04 NOTE — H&P (Signed)
PCP:    Kaleen Mask, MD    Chief Complaint:   Abdominal pain  HPI: [name] [name] is a 57 y.o. female   has a past medical history of Arthritis; Asthma; and Neuromuscular disorder.   Presented with  4 day of epigastric pain that has been coming and going and today have become severe. She started to have nausea and vomiting, eating makes it worse. She had a subjective fever and some chills. Afebrile in ED.  She presented to [facility] ED and US of the abdomen showed evidence of cholelithiasis with common bile duct dilation up to 12 mm.  Her labs showed elevated lipase consistent with gall bladder pancreatitis. Hospitalist called for an admission. I also consulted and spoke to [facility] Dr. Dulce Sellar who will see the patient in consult in AM.  Review of Systems:     Pertinent positives include: chills, abdominal pain, nausea, vomiting,  Constitutional:  No weight loss, night sweats, Fevers,  fatigue, weight loss  HEENT:  No headaches, Difficulty swallowing,Tooth/dental problems,Sore throat,  No sneezing, itching, ear ache, nasal congestion, post nasal drip,  Cardio-vascular:  No chest pain, Orthopnea, PND, anasarca, dizziness, palpitations.no Bilateral lower extremity swelling  GI:  No heartburn, indigestion, diarrhea, change in bowel habits, loss of appetite, melena, blood in stool, hematemesis Resp:  no shortness of breath at rest. No dyspnea on exertion, No excess mucus, no productive cough, No non-productive cough, No coughing up of blood.No change in color of mucus.No wheezing. Skin:  no rash or lesions. No jaundice GU:  no dysuria, change in color of urine, no urgency or frequency. No straining to urinate.  No flank pain.  Musculoskeletal:  No joint pain or no joint swelling. No decreased range of motion. No back pain.  Psych:  No change in mood or affect. No depression or anxiety. No memory loss.  Neuro: no localizing neurological complaints, no tingling, no weakness, no  double vision, no gait abnormality, no slurred speech, no confusion  Otherwise ROS are negative except for above, 10 systems were reviewed  Past Medical History: Past Medical History  Diagnosis Date  . Arthritis   . Asthma   . Neuromuscular disorder    Past Surgical History  Procedure Date  . Tonsillectomy 1971  . Lipoma removed from back 1976  . Spine surgery 1984  . Tubal ligation 1984  . Knee surgery   . Carpal tunnel release 1998/ and 2001     Medications: Prior to Admission medications   Medication Sig Start Date End Date Taking? Authorizing Provider  beta carotene w/minerals (OCUVITE) tablet Take 1 tablet by mouth daily.   Yes Historical Provider, MD  bisacodyl (BISACODYL) 5 MG EC tablet Take 5 mg by mouth daily as needed. For constipation   Yes Historical Provider, MD  cholecalciferol (VITAMIN D) 1000 UNITS tablet Take 1,000 Units by mouth daily.   Yes Historical Provider, MD  Docusate Calcium (STOOL SOFTENER PO) Take 1 capsule by mouth daily as needed. For constipation   Yes Historical Provider, MD  estradiol (ESTRACE) 2 MG tablet Take 2 mg by mouth daily.   Yes Historical Provider, MD  Flaxseed, Linseed, (FLAX SEEDS PO) Take 1 capsule by mouth daily.    Yes Historical Provider, MD  gabapentin (NEURONTIN) 300 MG capsule Take 300 mg by mouth 3 (three) times daily.   Yes Historical Provider, MD  glucosamine-chondroitin 500-400 MG tablet Take 1 tablet by mouth 2 (two) times daily.    Yes Historical Provider, MD  HYDROcodone-acetaminophen (NORCO) 7.5-325 MG per tablet Take 1 tablet by mouth every 6 (six) hours as needed. For pain   Yes Historical Provider, MD  methocarbamol (ROBAXIN) 750 MG tablet Take 750 mg by mouth 2 (two) times daily.    Yes Historical Provider, MD    Allergies:   Allergies  Allergen Reactions  . Ibuprofen Itching, Nausea And Vomiting and Swelling    Social History:  Ambulatory independently  Lives at   Home alone   reports that she has quit  smoking. She quit smokeless tobacco use about 18 years ago. She reports that she does not drink alcohol or use illicit drugs.   Family History: family history includes Cancer in her father and mother.    Physical Exam: Patient Vitals for the past 24 hrs:  BP Temp Temp src Pulse Resp SpO2  01/04/13 1745 - 97.9 F (36.6 C) Oral - - -  01/04/13 1744 135/90 mmHg - Oral 96  16  96 %    1. General:  in No Acute distress 2. Psychological: Alert and Oriented 3. Head/ENT:   Dry Mucous Membranes                          Head Non traumatic, neck supple                          Normal  Dentition 4. SKIN:decreased Skin turgor,  Skin clean Dry and intact no rash 5. Heart: Regular rate and rhythm no Murmur, Rub or gallop 6. Lungs: Clear to auscultation bilaterally, no wheezes or crackles   7. Abdomen: Soft,  Epigastric tenderness , Non distended 8. Lower extremities: no clubbing, cyanosis, or edema 9. Neurologically Grossly intact, moving all 4 extremities equally 10. MSK: Normal range of motion  body mass index is unknown because there is no height or weight on file.   Labs on Admission:   [facility] 01/04/13 1809  NA 140  K 3.9  CL 99  CO2 30  GLUCOSE 110*  BUN 22  CREATININE 0.73  CALCIUM 10.4  MG --  PHOS --    Basename 01/04/13 1809  AST 218*  ALT 274*  ALKPHOS 152*  BILITOT 1.7*  PROT 7.5  ALBUMIN 3.8    Basename 01/04/13 1809  LIPASE >3000*  AMYLASE --    Basename 01/04/13 1809  WBC 8.8  NEUTROABS 6.6  HGB 14.3  HCT 44.3  MCV 89.0  PLT 220   No results found for this basename: CKTOTAL:3,CKMB:3,CKMBINDEX:3,TROPONINI:3 in the last 72 hours No results found for this basename: TSH,T4TOTAL,FREET3,T3FREE,THYROIDAB in the last 72 hours No results found for this basename: VITAMINB12:2,FOLATE:2,FERRITIN:2,TIBC:2,IRON:2,RETICCTPCT:2 in the last 72 hours No results found for this basename: HGBA1C    The CrCl is unknown because both a height and weight (above a  minimum accepted value) are required for this calculation.  Other results:  I have pearsonaly reviewed this: ECG REPORT  Rate: 72  Rhythm: partial RBBB ST&T Change: no ischemic changes  UA no evidence of infection   Cultures:    Component Value Date/Time   SDES ABSCESS ABDOMEN 01/09/2009 1345   SDES ABSCESS ABDOMEN 01/09/2009 1345   SDES ABSCESS ABDOMEN 01/09/2009 1345   SPECREQUEST NONE 01/09/2009 1345   SPECREQUEST NONE 01/09/2009 1345   SPECREQUEST NONE 01/09/2009 1345   CULT NO ANAEROBES ISOLATED 01/09/2009 1345   CULT NO GROWTH 3 DAYS 01/09/2009 1345   REPTSTATUS 01/09/2009 FINAL 01/09/2009  1345   REPTSTATUS 01/14/2009 FINAL 01/09/2009 1345   REPTSTATUS 01/12/2009 FINAL 01/09/2009 1345       Radiological Exams on Admission: US Abdomen Complete  01/04/2013  *RADIOLOGY REPORT*  Clinical Data:  Abdominal pain.  Nausea and vomiting.  Elevated liver function test.  Elevated lipase.  COMPLETE ABDOMINAL ULTRASOUND  Comparison:  01/09/2009 CT.  Findings:  Gallbladder:  Multiple gallstones are present.  Largest measures 9 mm.  These demonstrate typical posterior acoustic shadowing and increased echogenicity. There is no wall thickening.  No sonographic Murphy's sign.  Common bile duct:  Enlarged, measuring between 12 mm and 13 mm. There is no common duct stone identified.  Liver:  Mild intrahepatic biliary ductal dilation is present.  No focal mass lesion.  IVC:  Appears normal.  Pancreas:  Enlargement of the pancreatic head.  Again, no common duct stone is identified in the intrapancreatic distal common bile duct.  Spleen:  8.6 cm.  Normal echotexture.  Right Kidney:  12.2 cm. Normal echotexture.  Normal central sinus echo complex.  No calculi or hydronephrosis.  Left Kidney:  12.3 cm. Normal echotexture.  Normal central sinus echo complex.  No calculi or hydronephrosis.  Abdominal aorta:  2.4 cm.  IMPRESSION: 1.  Cholelithiasis without findings of acute cholecystitis. 2.  Dilated common bile duct  with mild intrahepatic biliary ductal dilation.  There is no visualized common duct stone however with intra and extrahepatic biliary ductal dilation; findings suspicious for a distal common duct stone.  In the setting of elevated lipase, gallstone pancreatitis is likely. 3.  Enlargement of pancreatic head compatible with pancreatitis.   Original Report Authenticated By: Andreas Newport, M.D.     Chart has been reviewed  Assessment/Plan  57 year old female with gall stone pancreatitis  Present on Admission:  . Pancreatitis - make patient NPO, aggressive IVF, pain management. Gi consult in AM, hold off on antibiotics as there is no evidence of infection at this point.  . Common biliary duct obstruction - this will need to be further evaluated once pancreatitis is improved. [facility] is aware. Patient would likely benefit from elective cholecystectomy once her acute issues has resolved.    Prophylaxis: SCD Protonix  CODE STATUS:FULL CODE  Other plan as per orders.  I have spent a total of 65 min on this admission, time taken to speak with patient and family extensively and to [facility] GI Research scientist (medical).   Byanca Kasper 01/04/2013, 8:59 PM

## 2013-01-05 ENCOUNTER — Inpatient Hospital Stay (HOSPITAL_COMMUNITY): Payer: Medicare Other

## 2013-01-05 ENCOUNTER — Encounter (INDEPENDENT_AMBULATORY_CARE_PROVIDER_SITE_OTHER): Payer: Medicare Other | Admitting: Ophthalmology

## 2013-01-05 ENCOUNTER — Encounter (HOSPITAL_COMMUNITY): Payer: Self-pay | Admitting: General Practice

## 2013-01-05 DIAGNOSIS — R51 Headache: Secondary | ICD-10-CM

## 2013-01-05 LAB — LIPID PANEL
Cholesterol: 225 mg/dL — ABNORMAL HIGH (ref 0–200)
HDL: 73 mg/dL (ref >39)
Triglycerides: 45 mg/dL (ref <150)

## 2013-01-05 LAB — CBC
Platelets: 226 10*3/uL (ref 150–400)
RBC: 4.91 MIL/uL (ref 3.87–5.11)
RDW: 13.2 % (ref 11.5–15.5)
WBC: 10.4 10*3/uL (ref 4.0–10.5)

## 2013-01-05 LAB — LIPASE, BLOOD: Lipase: >3000 U/L — ABNORMAL HIGH (ref 11–59)

## 2013-01-05 LAB — COMPREHENSIVE METABOLIC PANEL
Alkaline Phosphatase: 150 U/L — ABNORMAL HIGH (ref 39–117)
BUN: 19 mg/dL (ref 6–23)
Chloride: 104 mEq/L (ref 96–112)
Creatinine, Ser: 0.8 mg/dL (ref 0.50–1.10)
GFR calc Af Amer: >90 mL/min (ref >90)
GFR calc non Af Amer: 82 mL/min — ABNORMAL LOW (ref >90)
Glucose, Bld: 133 mg/dL — ABNORMAL HIGH (ref 70–99)
Potassium: 5.2 mEq/L — ABNORMAL HIGH (ref 3.5–5.1)
Total Bilirubin: 1.3 mg/dL — ABNORMAL HIGH (ref 0.3–1.2)

## 2013-01-05 LAB — MAGNESIUM: Magnesium: 1.9 mg/dL (ref 1.5–2.5)

## 2013-01-05 MED ORDER — SODIUM CHLORIDE 0.9 % IV BOLUS (SEPSIS)
500.0000 mL | Freq: Once | INTRAVENOUS | Status: AC
  Administered 2013-01-05: 500 mL via INTRAVENOUS

## 2013-01-05 MED ORDER — IOHEXOL 300 MG/ML  SOLN
20.0000 mL | INTRAMUSCULAR | Status: AC
  Administered 2013-01-05 (×2): 25 mL via ORAL

## 2013-01-05 MED ORDER — SODIUM CHLORIDE 0.9 % IV SOLN
INTRAVENOUS | Status: DC
  Administered 2013-01-05 – 2013-01-08 (×11): via INTRAVENOUS

## 2013-01-05 MED ORDER — HYDROMORPHONE HCL PF 1 MG/ML IJ SOLN
INTRAMUSCULAR | Status: AC
  Filled 2013-01-05: qty 2

## 2013-01-05 MED ORDER — SODIUM POLYSTYRENE SULFONATE 15 GM/60ML PO SUSP
30.0000 g | Freq: Once | ORAL | Status: AC
  Administered 2013-01-05: 30 g via RECTAL
  Filled 2013-01-05: qty 120

## 2013-01-05 MED ORDER — HYDROMORPHONE HCL PF 1 MG/ML IJ SOLN
1.0000 mg | INTRAMUSCULAR | Status: DC | PRN
  Administered 2013-01-05 (×2): 2 mg via INTRAVENOUS
  Administered 2013-01-05: 1 mg via INTRAVENOUS
  Administered 2013-01-05: 2 mg via INTRAVENOUS
  Administered 2013-01-05: 1 mg via INTRAVENOUS
  Administered 2013-01-05 (×2): 2 mg via INTRAVENOUS
  Administered 2013-01-05: 1 mg via INTRAVENOUS
  Administered 2013-01-05 – 2013-01-06 (×2): 2 mg via INTRAVENOUS
  Administered 2013-01-06: 1 mg via INTRAVENOUS
  Administered 2013-01-06 (×8): 2 mg via INTRAVENOUS
  Administered 2013-01-07 (×2): 1 mg via INTRAVENOUS
  Administered 2013-01-07 (×4): 2 mg via INTRAVENOUS
  Filled 2013-01-05: qty 2
  Filled 2013-01-05: qty 1
  Filled 2013-01-05 (×5): qty 2
  Filled 2013-01-05: qty 1
  Filled 2013-01-05 (×12): qty 2
  Filled 2013-01-05 (×2): qty 1
  Filled 2013-01-05: qty 2
  Filled 2013-01-05 (×2): qty 1
  Filled 2013-01-05: qty 2

## 2013-01-05 MED ORDER — IOHEXOL 300 MG/ML  SOLN
100.0000 mL | Freq: Once | INTRAMUSCULAR | Status: AC | PRN
  Administered 2013-01-05: 100 mL via INTRAVENOUS
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
## 2013-01-05 NOTE — ED Provider Notes (Signed)
I saw and evaluated the patient, reviewed the resident's note and I agree with the findings and plan.   Tobin Chad, MD 01/05/13 0030

## 2013-01-05 NOTE — Progress Notes (Signed)
[facility] PROGRESS NOTE  Assessment/Plan: Pancreatitis/Common biliary duct obstruction (01/04/2013) - NPO LFT's are still high. - will probably  need ERCP VS MRCP. - consult surgery. - IV fluids narcotics for pain.   Hyperkalemia: - b-met in am, IV fluids bolus. - kayexalate enema.  Code Status: full Family Communication: none  Disposition Plan: home TBD   Consultants:  Dr. Dulce Sellar GI  surgery  Procedures:  ERCP VS MRCP  Antibiotics:  none  HPI/Subjective: Still having pain  Objective: Filed Vitals:   01/04/13 2218 01/05/13 0001 01/05/13 0510 01/05/13 0948  BP: 114/52 130/75 122/56 132/75  Pulse: 83 75 88 83  Temp:  97.9 F (36.6 C) 98.3 F (36.8 C) 98.4 F (36.9 C)  TempSrc:  Oral Oral Oral  Resp: 14 16 16 18   Height:  5\' 5"  (1.651 m)    Weight:  125.646 kg (277 lb)    SpO2: 96% 99% 96% 95%    Intake/Output Summary (Last 24 hours) at 01/05/13 1007 Last data filed at 01/05/13 0500  Gross per 24 hour  Intake      0 ml  Output    300 ml  Net   -300 ml   Filed Weights   01/05/13 0001  Weight: 125.646 kg (277 lb)    Exam:  General: Alert, awake, oriented x3, in no acute distress.  HEENT: No bruits, no goiter.  Heart: Regular rate and rhythm, without murmurs, rubs, gallops.  Lungs: Good air movement, bilateral air movement.  Abdomen: Soft, nontender, nondistended, positive bowel sounds.  Neuro: Grossly intact, nonfocal.   Data Reviewed: Basic Metabolic Panel:  Lab 01/05/13 1478 01/04/13 1809  NA 142 140  K 5.2* 3.9  CL 104 99  CO2 31 30  GLUCOSE 133* 110*  BUN 19 22  CREATININE 0.80 0.73  CALCIUM 9.1 10.4  MG 1.9 --  PHOS 4.1 --   Liver Function Tests:  Lab 01/05/13 0455 01/04/13 1809  AST 210* 218*  ALT 279* 274*  ALKPHOS 150* 152*  BILITOT 1.3* 1.7*  PROT 6.3 7.5  ALBUMIN 3.3* 3.8    Lab 01/05/13 0455 01/04/13 1809  LIPASE >3000* >3000*  AMYLASE -- --   No results found for this basename: AMMONIA:5 in the last  168 hours CBC:  Lab 01/05/13 0455 01/04/13 1809  WBC 10.4 8.8  NEUTROABS -- 6.6  HGB 14.4 14.3  HCT 44.8 44.3  MCV 91.2 89.0  PLT 226 220   Cardiac Enzymes: No results found for this basename: CKTOTAL:5,CKMB:5,CKMBINDEX:5,TROPONINI:5 in the last 168 hours BNP (last 3 results) No results found for this basename: PROBNP:3 in the last 8760 hours CBG: No results found for this basename: GLUCAP:5 in the last 168 hours  No results found for this or any previous visit (from the past 240 hour(s)).   Studies: US Abdomen Complete  01/04/2013  *RADIOLOGY REPORT*  Clinical Data:  Abdominal pain.  Nausea and vomiting.  Elevated liver function test.  Elevated lipase.  COMPLETE ABDOMINAL ULTRASOUND  Comparison:  01/09/2009 CT.  Findings:  Gallbladder:  Multiple gallstones are present.  Largest measures 9 mm.  These demonstrate typical posterior acoustic shadowing and increased echogenicity. There is no wall thickening.  No sonographic Murphy's sign.  Common bile duct:  Enlarged, measuring between 12 mm and 13 mm. There is no common duct stone identified.  Liver:  Mild intrahepatic biliary ductal dilation is present.  No focal mass lesion.  IVC:  Appears normal.  Pancreas:  Enlargement of the pancreatic head.  Again, no common duct stone is identified in the intrapancreatic distal common bile duct.  Spleen:  8.6 cm.  Normal echotexture.  Right Kidney:  12.2 cm. Normal echotexture.  Normal central sinus echo complex.  No calculi or hydronephrosis.  Left Kidney:  12.3 cm. Normal echotexture.  Normal central sinus echo complex.  No calculi or hydronephrosis.  Abdominal aorta:  2.4 cm.  IMPRESSION: 1.  Cholelithiasis without findings of acute cholecystitis. 2.  Dilated common bile duct with mild intrahepatic biliary ductal dilation.  There is no visualized common duct stone however with intra and extrahepatic biliary ductal dilation; findings suspicious for a distal common duct stone.  In the setting of elevated  lipase, gallstone pancreatitis is likely. 3.  Enlargement of pancreatic head compatible with pancreatitis.   Original Report Authenticated By: Andreas Newport, M.D.     Scheduled Meds:   . gabapentin  300 mg Oral TID  . HYDROmorphone      . HYDROmorphone      . pantoprazole (PROTONIX) IV  40 mg Intravenous QHS   Continuous Infusions:    Marinda Elk  [facility] Pager [phone]. If 8PM-8AM, please contact night-coverage at [email or web address], password [facility] 01/05/2013, 10:07 AM  LOS: 1 day

## 2013-01-05 NOTE — Progress Notes (Signed)
Pt family member came out into the hall and reported that patient said that she can't breath. I went into room and patient was upset and crying having a hard time catching her breath. I had patient go through some deep breathing activities and was able to get the patient to calm down after a few minutes. Pt family member still at the bedside and will continue monitoring patient. Rn will also continue monitoring patient as well.

## 2013-01-06 DIAGNOSIS — K859 Acute pancreatitis without necrosis or infection, unspecified: Secondary | ICD-10-CM

## 2013-01-06 DIAGNOSIS — D72829 Elevated white blood cell count, unspecified: Secondary | ICD-10-CM | POA: Diagnosis present

## 2013-01-06 LAB — COMPREHENSIVE METABOLIC PANEL
Albumin: 2.9 g/dL — ABNORMAL LOW (ref 3.5–5.2)
BUN: 20 mg/dL (ref 6–23)
Chloride: 105 mEq/L (ref 96–112)
Creatinine, Ser: 0.71 mg/dL (ref 0.50–1.10)
GFR calc non Af Amer: >90 mL/min (ref >90)
Total Bilirubin: 0.9 mg/dL (ref 0.3–1.2)

## 2013-01-06 LAB — CBC WITH DIFFERENTIAL/PLATELET
Basophils Relative: 0 % (ref 0–1)
Eosinophils Relative: 0 % (ref 0–5)
HCT: 44.6 % (ref 36.0–46.0)
Hemoglobin: 14.2 g/dL (ref 12.0–15.0)
MCH: 29.5 pg (ref 26.0–34.0)
MCHC: 31.8 g/dL (ref 30.0–36.0)
MCV: 92.7 fL (ref 78.0–100.0)
Monocytes Absolute: 1.2 10*3/uL — ABNORMAL HIGH (ref 0.1–1.0)
Monocytes Relative: 6 % (ref 3–12)
Neutro Abs: 17.2 10*3/uL — ABNORMAL HIGH (ref 1.7–7.7)

## 2013-01-06 LAB — LIPASE, BLOOD: Lipase: 1519 U/L — ABNORMAL HIGH (ref 11–59)

## 2013-01-06 MED ORDER — POLYETHYLENE GLYCOL 3350 17 G PO PACK
17.0000 g | PACK | Freq: Every day | ORAL | Status: DC
  Administered 2013-01-06 – 2013-01-19 (×9): 17 g via ORAL
  Filled 2013-01-06 (×15): qty 1

## 2013-01-06 MED ORDER — METHOCARBAMOL 750 MG PO TABS
750.0000 mg | ORAL_TABLET | Freq: Once | ORAL | Status: AC
  Administered 2013-01-06: 750 mg via ORAL
  Filled 2013-01-06: qty 1

## 2013-01-06 MED ORDER — PIPERACILLIN-TAZOBACTAM 3.375 G IVPB
3.3750 g | Freq: Three times a day (TID) | INTRAVENOUS | Status: DC
  Administered 2013-01-06 – 2013-01-09 (×8): 3.375 g via INTRAVENOUS
  Filled 2013-01-06 (×12): qty 50

## 2013-01-06 MED ORDER — HYDROMORPHONE HCL PF 1 MG/ML IJ SOLN
1.0000 mg | Freq: Once | INTRAMUSCULAR | Status: AC
  Administered 2013-01-06: 1 mg via INTRAVENOUS
  Filled 2013-01-06 (×3): qty 1

## 2013-01-06 NOTE — Progress Notes (Signed)
[name] [name] 10:27 AM  Subjective: The patient is doing about the same but wants something to drink and has no nausea or vomiting and is passing a litt air from below and has no new complaints and we discussed with her and her sister-in-law about gallstone pancreatitis and briefly discussed MRCP which she says she can't get in the MRI machine due to claustrophobia versus EUS and ERCP as well as the risks and methods versus proceeding with surgery and awaiting Intra-Op cholangiogram when pancreatitis clears Objective: Vital signs stable afebrile no acute distress abdomen has minimal upper discomfort no lower discomfort occasional bowel sounds no guarding or rebound CT reviewed increased white count decreased liver tests and lipase   Assessment: Gallstone pancreatitis seemingly improved   Plan: Will allow sips of clear liquid see above for our discussion on how to proceed I warned her if the liquids made her worse to stop drinking and she might need TPN if were unable to advance her diet over the next few days and will need a surgical consult at some point  [facility] E
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
## 2013-01-06 NOTE — Progress Notes (Signed)
[facility] PROGRESS NOTE  Assessment/Plan: Pancreatitis/Common biliary duct obstruction (01/04/2013) - NPO LFT's are trending down, she probably just passed a stone. - Ct abdomen distal obstruction of the common bile duct, and possibly the pancreatic duct, with acute pancreatitis and possible acute cholecystitis. Murphy positive. No signs of stone.  Afebrile. She also has significant leukocytosis with sign of acute cholecystis. Start zosyn 1.11.2014. - GI recs pending - consult surgery. - IV fluids narcotics for pain. - No BM enema and miralax  Hyperkalemia: - resolved.  Code Status: full Family Communication: none  Disposition Plan: home TBD   Consultants:  Dr. Dulce Sellar GI  surgery  Procedures:  ERCP VS MRCP  Antibiotics:  none  HPI/Subjective: Still having pain  Objective: Filed Vitals:   01/05/13 1341 01/05/13 2136 01/06/13 0152 01/06/13 0505  BP: 132/76 113/57 122/65 105/48  Pulse: 91 94 100 93  Temp: 98.6 F (37 C) 98.9 F (37.2 C) 99 F (37.2 C) 99.1 F (37.3 C)  TempSrc: Oral Oral Oral Oral  Resp: 18 17 16 16   Height:      Weight:      SpO2: 93% 95% 94% 93%    Intake/Output Summary (Last 24 hours) at 01/06/13 1020 Last data filed at 01/06/13 0510  Gross per 24 hour  Intake 3153.83 ml  Output   1300 ml  Net 1853.83 ml   Filed Weights   01/05/13 0001  Weight: 125.646 kg (277 lb)    Exam:  General: Alert, awake, oriented x3, in no acute distress.  HEENT: No bruits, no goiter.  Heart: Regular rate and rhythm, without murmurs, rubs, gallops.  Lungs: Good air movement, bilateral air movement.  Abdomen: Soft, murphy sign positive, rebound. Neuro: Grossly intact, nonfocal.   Data Reviewed: Basic Metabolic Panel:  Lab 01/06/13 1610 01/05/13 0455 01/04/13 1809  NA 142 142 140  K 4.3 5.2* 3.9  CL 105 104 99  CO2 28 31 30   GLUCOSE 87 133* 110*  BUN 20 19 22   CREATININE 0.71 0.80 0.73  CALCIUM 8.2* 9.1 10.4  MG -- 1.9 --  PHOS --  4.1 --   Liver Function Tests:  Lab 01/06/13 0555 01/05/13 0455 01/04/13 1809  AST 65* 210* 218*  ALT 158* 279* 274*  ALKPHOS 121* 150* 152*  BILITOT 0.9 1.3* 1.7*  PROT 6.0 6.3 7.5  ALBUMIN 2.9* 3.3* 3.8    Lab 01/06/13 0555 01/05/13 0455 01/04/13 1809  LIPASE 1519* >3000* >3000*  AMYLASE -- -- --   No results found for this basename: AMMONIA:5 in the last 168 hours CBC:  Lab 01/06/13 0555 01/05/13 0455 01/04/13 1809  WBC 19.4* 10.4 8.8  NEUTROABS 17.2* -- 6.6  HGB 14.2 14.4 14.3  HCT 44.6 44.8 44.3  MCV 92.7 91.2 89.0  PLT 207 226 220   Cardiac Enzymes: No results found for this basename: CKTOTAL:5,CKMB:5,CKMBINDEX:5,TROPONINI:5 in the last 168 hours BNP (last 3 results) No results found for this basename: PROBNP:3 in the last 8760 hours CBG: No results found for this basename: GLUCAP:5 in the last 168 hours  No results found for this or any previous visit (from the past 240 hour(s)).   Studies: US Abdomen Complete  01/04/2013  *RADIOLOGY REPORT*  Clinical Data:  Abdominal pain.  Nausea and vomiting.  Elevated liver function test.  Elevated lipase.  COMPLETE ABDOMINAL ULTRASOUND  Comparison:  01/09/2009 CT.  Findings:  Gallbladder:  Multiple gallstones are present.  Largest measures 9 mm.  These demonstrate typical posterior acoustic shadowing and  increased echogenicity. There is no wall thickening.  No sonographic Murphy's sign.  Common bile duct:  Enlarged, measuring between 12 mm and 13 mm. There is no common duct stone identified.  Liver:  Mild intrahepatic biliary ductal dilation is present.  No focal mass lesion.  IVC:  Appears normal.  Pancreas:  Enlargement of the pancreatic head.  Again, no common duct stone is identified in the intrapancreatic distal common bile duct.  Spleen:  8.6 cm.  Normal echotexture.  Right Kidney:  12.2 cm. Normal echotexture.  Normal central sinus echo complex.  No calculi or hydronephrosis.  Left Kidney:  12.3 cm. Normal echotexture.  Normal  central sinus echo complex.  No calculi or hydronephrosis.  Abdominal aorta:  2.4 cm.  IMPRESSION: 1.  Cholelithiasis without findings of acute cholecystitis. 2.  Dilated common bile duct with mild intrahepatic biliary ductal dilation.  There is no visualized common duct stone however with intra and extrahepatic biliary ductal dilation; findings suspicious for a distal common duct stone.  In the setting of elevated lipase, gallstone pancreatitis is likely. 3.  Enlargement of pancreatic head compatible with pancreatitis.   Original Report Authenticated By: Andreas Newport, M.D.    Ct Abdomen Pelvis W Contrast  01/05/2013  *RADIOLOGY REPORT*  Clinical Data: Abdominal pain.  Nausea and vomiting.  CT ABDOMEN AND PELVIS WITH CONTRAST  Technique:  Multidetector CT imaging of the abdomen and pelvis was performed following the standard protocol during bolus administration of intravenous contrast.  Contrast: OMNIPAQUE IOHEXOL 300 MG/ML  SOLN  Comparison: CT of the abdomen and pelvis 01/09/2009.  Findings:  Lung Bases: Unremarkable.  Abdomen/Pelvis:  Gallbladder appears moderately distended and there is some pericholecystic fluid and stranding.  Mild intrahepatic biliary ductal dilatation.  Common bile duct also appears dilated measuring up to 15 mm in diameter in the porta hepatis.  No definite radiopaque stone is identified within the common bile duct, or within the lumen of the gallbladder.  The appearance of the liver is otherwise unremarkable.  Peripancreatic stranding is noted diffusely, suggesting pancreatitis.  There is a small amount of fluid surrounding the spleen.  The adrenal glands and kidneys are unremarkable in appearance bilaterally.  Atherosclerosis throughout the abdominal and pelvic vasculature, without definite aneurysm or dissection.  Numerous colonic diverticula are noted, without definite surrounding inflammatory changes to strongly suggest acute diverticulitis at this time.  The patient has a  large ventral hernia inferiorly which contains a portion of the colon and multiple loops of small bowel.  No definite signs to suggest bowel incarceration or obstruction at this time.  Low-lying rectum well below the level of the pubococcygeal line, which could suggest rectal prolapse.  Status post hysterectomy.  Ovaries are atrophic. The urinary bladder is unremarkable in appearance.  Musculoskeletal: There are no aggressive appearing lytic or blastic lesions noted in the visualized portions of the skeleton. Anterior lumbar fixation at L4-L5 and L5-S1.  Status post laminectomy at L2, L3-L4 with PLIF from L2-L4.  Interbody grafts are present at L2-L3, L3-L4, L4-L5 and L5 - S1.  5 mm of retrolisthesis of L1 upon L2 is noted.  Alignment is otherwise anatomic.  IMPRESSION: 1.  Findings, as above, concerning for a the distal obstruction of the common bile duct, and possibly the pancreatic duct, with acute pancreatitis and possible acute cholecystitis. This could represent a stricture, recently passed a ductal stone, or a nonradiopaque stone in the distal common bile duct.  Clinical correlation is recommended. 2.  Extensive colonic  diverticulosis without findings to suggest acute diverticulitis at this time.  3.  Large inferior ventral hernia containing portions of the colon and small bowel, without signs to suggest bowel obstruction at this time. 4.  Atherosclerosis. 5.  Extensive postoperative changes in the spine, as above, with 5 mm of retrolisthesis of L1 upon L2. 6.  Findings suggestive of rectal prolapse.  Clinical correlation may be warranted.   Original Report Authenticated By: Trudie Reed, M.D.     Scheduled Meds:    . gabapentin  300 mg Oral TID  . pantoprazole (PROTONIX) IV  40 mg Intravenous QHS   Continuous Infusions:    . sodium chloride 125 mL/hr at 01/06/13 0143     Marinda Elk  [facility] Pager [phone]. If 8PM-8AM, please contact night-coverage at [email or web address],  password [facility] 01/06/2013, 10:20 AM  LOS: 2 days
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
## 2013-01-07 ENCOUNTER — Inpatient Hospital Stay (HOSPITAL_COMMUNITY): Payer: Medicare Other

## 2013-01-07 LAB — COMPREHENSIVE METABOLIC PANEL
ALT: 91 U/L — ABNORMAL HIGH (ref 0–35)
AST: 28 U/L (ref 0–37)
CO2: 28 mEq/L (ref 19–32)
Chloride: 102 mEq/L (ref 96–112)
GFR calc non Af Amer: >90 mL/min (ref >90)
Sodium: 139 mEq/L (ref 135–145)
Total Bilirubin: 0.6 mg/dL (ref 0.3–1.2)

## 2013-01-07 LAB — CBC WITH DIFFERENTIAL/PLATELET
Basophils Absolute: 0 10*3/uL (ref 0.0–0.1)
HCT: 41.2 % (ref 36.0–46.0)
Lymphocytes Relative: 5 % — ABNORMAL LOW (ref 12–46)
Neutro Abs: 15.8 10*3/uL — ABNORMAL HIGH (ref 1.7–7.7)
Platelets: 203 10*3/uL (ref 150–400)
RDW: 14.1 % (ref 11.5–15.5)
WBC: 17.8 10*3/uL — ABNORMAL HIGH (ref 4.0–10.5)

## 2013-01-07 MED ORDER — HYDROMORPHONE HCL PF 1 MG/ML IJ SOLN
1.0000 mg | Freq: Once | INTRAMUSCULAR | Status: AC
  Administered 2013-01-07: 1 mg via INTRAVENOUS

## 2013-01-07 MED ORDER — ALBUTEROL SULFATE (5 MG/ML) 0.5% IN NEBU
5.0000 mg | INHALATION_SOLUTION | Freq: Once | RESPIRATORY_TRACT | Status: AC
  Administered 2013-01-07: 5 mg via RESPIRATORY_TRACT
  Filled 2013-01-07: qty 0.5

## 2013-01-07 MED ORDER — NALOXONE HCL 0.4 MG/ML IJ SOLN: 0.4000 mg | INTRAMUSCULAR | Status: DC | PRN

## 2013-01-07 MED ORDER — SODIUM CHLORIDE 0.9 % IJ SOLN: 9.0000 mL | INTRAMUSCULAR | Status: DC | PRN

## 2013-01-07 MED ORDER — ONDANSETRON HCL 4 MG/2ML IJ SOLN
4.0000 mg | Freq: Four times a day (QID) | INTRAMUSCULAR | Status: DC | PRN
  Administered 2013-01-10: 4 mg via INTRAVENOUS
  Filled 2013-01-07: qty 2

## 2013-01-07 MED ORDER — IPRATROPIUM BROMIDE 0.02 % IN SOLN
0.5000 mg | Freq: Once | RESPIRATORY_TRACT | Status: AC
  Administered 2013-01-07: 0.5 mg via RESPIRATORY_TRACT
  Filled 2013-01-07: qty 2.5

## 2013-01-07 MED ORDER — HYDROMORPHONE 0.3 MG/ML IV SOLN
INTRAVENOUS | Status: DC
  Administered 2013-01-07: 2.4 mg via INTRAVENOUS
  Administered 2013-01-07 (×2): via INTRAVENOUS
  Administered 2013-01-07: 0.9 mg via INTRAVENOUS
  Administered 2013-01-08: 2.1 mg via INTRAVENOUS
  Administered 2013-01-08: 5.2 mg via INTRAVENOUS
  Administered 2013-01-08: 07:00:00 via INTRAVENOUS
  Filled 2013-01-07 (×3): qty 25

## 2013-01-07 MED ORDER — DIPHENHYDRAMINE HCL 50 MG/ML IJ SOLN: 12.5000 mg | Freq: Four times a day (QID) | INTRAMUSCULAR | Status: DC | PRN

## 2013-01-07 MED ORDER — IOHEXOL 350 MG/ML SOLN
80.0000 mL | Freq: Once | INTRAVENOUS | Status: AC | PRN
  Administered 2013-01-07: 80 mL via INTRAVENOUS

## 2013-01-07 MED ORDER — DIPHENHYDRAMINE HCL 12.5 MG/5ML PO ELIX
12.5000 mg | ORAL_SOLUTION | Freq: Four times a day (QID) | ORAL | Status: DC | PRN
  Filled 2013-01-07: qty 5

## 2013-01-07 NOTE — Progress Notes (Signed)
General surgery attending note:.  I have personally interviewed and examined this patient this morning. She is in distress from left flank pain. This is out of proportion to her lab work , abdominal exam, and CT findings. This may be due to pancreatitis but her pancreatitis doesn't look that severe. I think we are going to need to go ahead with CT angio chest to rule out pulmonary embolism.   Angelia Mould. Derrell Lolling, M.D., [facility] General and Minimally invasive Surgery Breast and Colorectal Surgery Office:   [phone] Pager:   [phone]

## 2013-01-07 NOTE — Progress Notes (Addendum)
[facility] PROGRESS NOTE  Assessment/Plan: Pancreatitis/Common biliary duct obstruction (01/04/2013) - LFT's are trending down. - CT angio 1.12.2014: negative for PE, thickening of the peribronchovascular  interstitium and some patchy air space disease bilaterally ? Concern for ARDS. Monitor Saturations.  Strict I and O's. Patient not tachycardic or SOB. - Ct abdomen distal obstruction of the common bile duct, and possibly the pancreatic duct, with acute pancreatitis and possible acute cholecystitis. Start zosyn 1.11.2014. - consult for lap choli at some point. - IV fluids, narcotics for pain. - KUB for SBO.  Hyperkalemia: - resolved.  Code Status: full Family Communication: none  Disposition Plan: home TBD   Consultants:  Dr. Dulce Sellar GI  surgery  Procedures:  Lap choli:   CT angio chest : 1.12.2014: negative for PE, thickening of the peribronchovascular  interstitium and some patchy air space disease bilaterally  Antibiotics:  none  HPI/Subjective: Still having pain, but on left flank, made worst with inspiration.  Objective: Filed Vitals:   01/06/13 1500 01/06/13 1800 01/06/13 2100 01/07/13 0631  BP:  106/65 142/56 120/61  Pulse:  87 94 91  Temp:  97.8 F (36.6 C) 98.7 F (37.1 C) 99.1 F (37.3 C)  TempSrc: Oral Oral Oral   Resp:  20 20 19   Height:      Weight:      SpO2:  93% 94% 93%    Intake/Output Summary (Last 24 hours) at 01/07/13 1023 Last data filed at 01/07/13 0900  Gross per 24 hour  Intake 1279.17 ml  Output   1900 ml  Net -620.83 ml   Filed Weights   01/05/13 0001  Weight: 125.646 kg (277 lb)    Exam:  General: Alert, awake, oriented x3, in no acute distress.  HEENT: No bruits, no goiter.  Heart: Regular rate and rhythm, without murmurs, rubs, gallops.  Lungs: Good air movement, bilateral air movement.  Abdomen: Soft, murphy sign positive, rebound. Left flank tender to palpation Neuro: Grossly intact, nonfocal.   Data  Reviewed: Basic Metabolic Panel:  Lab 01/07/13 1478 01/06/13 0555 01/05/13 0455 01/04/13 1809  NA 139 142 142 140  K 4.4 4.3 5.2* 3.9  CL 102 105 104 99  CO2 28 28 31 30   GLUCOSE 107* 87 133* 110*  BUN 23 20 19 22   CREATININE 0.67 0.71 0.80 0.73  CALCIUM 8.4 8.2* 9.1 10.4  MG -- -- 1.9 --  PHOS -- -- 4.1 --   Liver Function Tests:  Lab 01/07/13 0620 01/06/13 0555 01/05/13 0455 01/04/13 1809  AST 28 65* 210* 218*  ALT 91* 158* 279* 274*  ALKPHOS 110 121* 150* 152*  BILITOT 0.6 0.9 1.3* 1.7*  PROT 5.9* 6.0 6.3 7.5  ALBUMIN 2.5* 2.9* 3.3* 3.8    Lab 01/07/13 0620 01/06/13 0555 01/05/13 0455 01/04/13 1809  LIPASE 123* 1519* >3000* >3000*  AMYLASE -- -- -- --   No results found for this basename: AMMONIA:5 in the last 168 hours CBC:  Lab 01/07/13 0620 01/06/13 0555 01/05/13 0455 01/04/13 1809  WBC 17.8* 19.4* 10.4 8.8  NEUTROABS 15.8* 17.2* -- 6.6  HGB 13.0 14.2 14.4 14.3  HCT 41.2 44.6 44.8 44.3  MCV 93.4 92.7 91.2 89.0  PLT 203 207 226 220   Cardiac Enzymes: No results found for this basename: CKTOTAL:5,CKMB:5,CKMBINDEX:5,TROPONINI:5 in the last 168 hours BNP (last 3 results) No results found for this basename: PROBNP:3 in the last 8760 hours CBG: No results found for this basename: GLUCAP:5 in the last 168 hours  No results found for this or any previous visit (from the past 240 hour(s)).   Studies: Ct Abdomen Pelvis W Contrast  01/05/2013  *RADIOLOGY REPORT*  Clinical Data: Abdominal pain.  Nausea and vomiting.  CT ABDOMEN AND PELVIS WITH CONTRAST  Technique:  Multidetector CT imaging of the abdomen and pelvis was performed following the standard protocol during bolus administration of intravenous contrast.  Contrast: OMNIPAQUE IOHEXOL 300 MG/ML  SOLN  Comparison: CT of the abdomen and pelvis 01/09/2009.  Findings:  Lung Bases: Unremarkable.  Abdomen/Pelvis:  Gallbladder appears moderately distended and there is some pericholecystic fluid and stranding.  Mild  intrahepatic biliary ductal dilatation.  Common bile duct also appears dilated measuring up to 15 mm in diameter in the porta hepatis.  No definite radiopaque stone is identified within the common bile duct, or within the lumen of the gallbladder.  The appearance of the liver is otherwise unremarkable.  Peripancreatic stranding is noted diffusely, suggesting pancreatitis.  There is a small amount of fluid surrounding the spleen.  The adrenal glands and kidneys are unremarkable in appearance bilaterally.  Atherosclerosis throughout the abdominal and pelvic vasculature, without definite aneurysm or dissection.  Numerous colonic diverticula are noted, without definite surrounding inflammatory changes to strongly suggest acute diverticulitis at this time.  The patient has a large ventral hernia inferiorly which contains a portion of the colon and multiple loops of small bowel.  No definite signs to suggest bowel incarceration or obstruction at this time.  Low-lying rectum well below the level of the pubococcygeal line, which could suggest rectal prolapse.  Status post hysterectomy.  Ovaries are atrophic. The urinary bladder is unremarkable in appearance.  Musculoskeletal: There are no aggressive appearing lytic or blastic lesions noted in the visualized portions of the skeleton. Anterior lumbar fixation at L4-L5 and L5-S1.  Status post laminectomy at L2, L3-L4 with PLIF from L2-L4.  Interbody grafts are present at L2-L3, L3-L4, L4-L5 and L5 - S1.  5 mm of retrolisthesis of L1 upon L2 is noted.  Alignment is otherwise anatomic.  IMPRESSION: 1.  Findings, as above, concerning for a the distal obstruction of the common bile duct, and possibly the pancreatic duct, with acute pancreatitis and possible acute cholecystitis. This could represent a stricture, recently passed a ductal stone, or a nonradiopaque stone in the distal common bile duct.  Clinical correlation is recommended. 2.  Extensive colonic diverticulosis without  findings to suggest acute diverticulitis at this time.  3.  Large inferior ventral hernia containing portions of the colon and small bowel, without signs to suggest bowel obstruction at this time. 4.  Atherosclerosis. 5.  Extensive postoperative changes in the spine, as above, with 5 mm of retrolisthesis of L1 upon L2. 6.  Findings suggestive of rectal prolapse.  Clinical correlation may be warranted.   Original Report Authenticated By: Trudie Reed, M.D.     Scheduled Meds:    . gabapentin  300 mg Oral TID  . pantoprazole (PROTONIX) IV  40 mg Intravenous QHS  . piperacillin-tazobactam (ZOSYN)  IV  3.375 g Intravenous Q8H  . polyethylene glycol  17 g Oral Daily   Continuous Infusions:    . sodium chloride 125 mL/hr at 01/07/13 0227     Marinda Elk  [facility] Pager [phone]. If 8PM-8AM, please contact night-coverage at [email or web address], password [facility] 01/07/2013, 10:23 AM  LOS: 3 days

## 2013-01-07 NOTE — Progress Notes (Signed)
[name] [name] 10:24 AM  Subjective: Patient had acute onset last night of left sided pain different than her previous pain and I agree with Dr. Jacinto Halim note and await her CT angiogram but she's had no nausea or vomiting and she is passing air from below  Objective: Vital signs stable afebrile increased pain from yesterday all along her left side and abdomen is actually soft with decreased tenderness and good bowel sound and white count is decreased as are liver tests and lipase  Assessment: New onset side pain await CTA  Plan: Consider TPn for nutrition soon if unable to advance diet over the next few days and with liver tests almost normal I believe Intra-Op cholangiogram first to rule out CBD stone is how to proceed  [facility] E

## 2013-01-07 NOTE — Progress Notes (Signed)
Patient ID: [name] [name], female   DOB: [birth year], 57 y.o.   MRN: [id number]    Subjective: Pt having pain in left flank, similar to yesterday, some nausea  Objective: Vital signs in last 24 hours: Temp:  [97.7 F (36.5 C)-99.1 F (37.3 C)] 99.1 F (37.3 C) (01/12 0631) Pulse Rate:  [87-94] 91  (01/12 0631) Resp:  [18-20] 19  (01/12 0631) BP: (103-142)/(48-69) 120/61 mmHg (01/12 0631) SpO2:  [92 %-94 %] 93 % (01/12 0631) FiO2 (%):  [2 %] 2 % (01/12 0631) Last BM Date: 01/04/13  Intake/Output from previous day: 01/11 0701 - 01/12 0700 In: 1279.2 [I.V.:1229.2; IV Piggyback:50] Out: 1500 [Urine:1500] Intake/Output this shift:    PE: Abd: soft, tender along epigastric and left flank Appears in pain  Lab Results:   Basename 01/07/13 0620 01/06/13 0555  WBC 17.8* 19.4*  HGB 13.0 14.2  HCT 41.2 44.6  PLT 203 207   BMET  Basename 01/07/13 0620 01/06/13 0555  NA 139 142  K 4.4 4.3  CL 102 105  CO2 28 28  GLUCOSE 107* 87  BUN 23 20  CREATININE 0.67 0.71  CALCIUM 8.4 8.2*   PT/INR No results found for this basename: LABPROT:2,INR:2 in the last 72 hours CMP     Component Value Date/Time   NA 139 01/07/2013 0620   K 4.4 01/07/2013 0620   CL 102 01/07/2013 0620   CO2 28 01/07/2013 0620   GLUCOSE 107* 01/07/2013 0620   BUN 23 01/07/2013 0620   CREATININE 0.67 01/07/2013 0620   CALCIUM 8.4 01/07/2013 0620   PROT 5.9* 01/07/2013 0620   ALBUMIN 2.5* 01/07/2013 0620   AST 28 01/07/2013 0620   ALT 91* 01/07/2013 0620   ALKPHOS 110 01/07/2013 0620   BILITOT 0.6 01/07/2013 0620   GFRNONAA >90 01/07/2013 0620   GFRAA >90 01/07/2013 0620   Lipase     Component Value Date/Time   LIPASE 123* 01/07/2013 0620       Studies/Results: Ct Abdomen Pelvis W Contrast  01/05/2013  *RADIOLOGY REPORT*  Clinical Data: Abdominal pain.  Nausea and vomiting.  CT ABDOMEN AND PELVIS WITH CONTRAST  Technique:  Multidetector CT imaging of the abdomen and pelvis was performed following the standard  protocol during bolus administration of intravenous contrast.  Contrast: OMNIPAQUE IOHEXOL 300 MG/ML  SOLN  Comparison: CT of the abdomen and pelvis 01/09/2009.  Findings:  Lung Bases: Unremarkable.  Abdomen/Pelvis:  Gallbladder appears moderately distended and there is some pericholecystic fluid and stranding.  Mild intrahepatic biliary ductal dilatation.  Common bile duct also appears dilated measuring up to 15 mm in diameter in the porta hepatis.  No definite radiopaque stone is identified within the common bile duct, or within the lumen of the gallbladder.  The appearance of the liver is otherwise unremarkable.  Peripancreatic stranding is noted diffusely, suggesting pancreatitis.  There is a small amount of fluid surrounding the spleen.  The adrenal glands and kidneys are unremarkable in appearance bilaterally.  Atherosclerosis throughout the abdominal and pelvic vasculature, without definite aneurysm or dissection.  Numerous colonic diverticula are noted, without definite surrounding inflammatory changes to strongly suggest acute diverticulitis at this time.  The patient has a large ventral hernia inferiorly which contains a portion of the colon and multiple loops of small bowel.  No definite signs to suggest bowel incarceration or obstruction at this time.  Low-lying rectum well below the level of the pubococcygeal line, which could suggest rectal prolapse.  Status post hysterectomy.  Ovaries are atrophic. The urinary bladder is unremarkable in appearance.  Musculoskeletal: There are no aggressive appearing lytic or blastic lesions noted in the visualized portions of the skeleton. Anterior lumbar fixation at L4-L5 and L5-S1.  Status post laminectomy at L2, L3-L4 with PLIF from L2-L4.  Interbody grafts are present at L2-L3, L3-L4, L4-L5 and L5 - S1.  5 mm of retrolisthesis of L1 upon L2 is noted.  Alignment is otherwise anatomic.  IMPRESSION: 1.  Findings, as above, concerning for a the distal  obstruction of the common bile duct, and possibly the pancreatic duct, with acute pancreatitis and possible acute cholecystitis. This could represent a stricture, recently passed a ductal stone, or a nonradiopaque stone in the distal common bile duct.  Clinical correlation is recommended. 2.  Extensive colonic diverticulosis without findings to suggest acute diverticulitis at this time.  3.  Large inferior ventral hernia containing portions of the colon and small bowel, without signs to suggest bowel obstruction at this time. 4.  Atherosclerosis. 5.  Extensive postoperative changes in the spine, as above, with 5 mm of retrolisthesis of L1 upon L2. 6.  Findings suggestive of rectal prolapse.  Clinical correlation may be warranted.   Original Report Authenticated By: Trudie Reed, M.D.     Anti-infectives: Anti-infectives     Start     Dose/Rate Route Frequency Ordered Stop   01/06/13 1100  piperacillin-tazobactam (ZOSYN) IVPB 3.375 g       3.375 g 12.5 mL/hr over 240 Minutes Intravenous 3 times per day 01/06/13 1045             Assessment/Plan 1. Gallstone pancreatitis: labs improving but still with lots of pain, will need cholecystectomy once pancreatitis resolves and once symptoms improve  --cont current treatment  --recheck labs in am  --will follow up tomorrow.  LOS: 3 days    [name] [name] 01/07/2013

## 2013-01-08 ENCOUNTER — Inpatient Hospital Stay (HOSPITAL_COMMUNITY): Payer: Medicare Other

## 2013-01-08 DIAGNOSIS — J9600 Acute respiratory failure, unspecified whether with hypoxia or hypercapnia: Secondary | ICD-10-CM | POA: Diagnosis present

## 2013-01-08 DIAGNOSIS — K831 Obstruction of bile duct: Secondary | ICD-10-CM

## 2013-01-08 DIAGNOSIS — E669 Obesity, unspecified: Secondary | ICD-10-CM

## 2013-01-08 DIAGNOSIS — J90 Pleural effusion, not elsewhere classified: Secondary | ICD-10-CM | POA: Diagnosis not present

## 2013-01-08 DIAGNOSIS — E876 Hypokalemia: Secondary | ICD-10-CM

## 2013-01-08 DIAGNOSIS — I959 Hypotension, unspecified: Secondary | ICD-10-CM | POA: Diagnosis not present

## 2013-01-08 DIAGNOSIS — J95821 Acute postprocedural respiratory failure: Secondary | ICD-10-CM

## 2013-01-08 DIAGNOSIS — J9811 Atelectasis: Secondary | ICD-10-CM | POA: Diagnosis not present

## 2013-01-08 DIAGNOSIS — K859 Acute pancreatitis without necrosis or infection, unspecified: Principal | ICD-10-CM

## 2013-01-08 DIAGNOSIS — R109 Unspecified abdominal pain: Secondary | ICD-10-CM

## 2013-01-08 DIAGNOSIS — R0602 Shortness of breath: Secondary | ICD-10-CM

## 2013-01-08 LAB — POCT I-STAT 3, ART BLOOD GAS (G3+)
Patient temperature: 98.6
TCO2: 33 mmol/L (ref 0–100)
pCO2 arterial: 45.1 mmHg — ABNORMAL HIGH (ref 35.0–45.0)
pH, Arterial: 7.451 — ABNORMAL HIGH (ref 7.350–7.450)

## 2013-01-08 LAB — CBC
HCT: 37.8 % (ref 36.0–46.0)
Hemoglobin: 12.1 g/dL (ref 12.0–15.0)
MCH: 29.7 pg (ref 26.0–34.0)
MCHC: 32 g/dL (ref 30.0–36.0)
MCV: 92.6 fL (ref 78.0–100.0)
Platelets: 227 10*3/uL (ref 150–400)
RBC: 4.08 MIL/uL (ref 3.87–5.11)
RDW: 14 % (ref 11.5–15.5)
WBC: 14 10*3/uL — ABNORMAL HIGH (ref 4.0–10.5)

## 2013-01-08 LAB — COMPREHENSIVE METABOLIC PANEL
ALT: 57 U/L — ABNORMAL HIGH (ref 0–35)
AST: 22 U/L (ref 0–37)
Albumin: 2.3 g/dL — ABNORMAL LOW (ref 3.5–5.2)
Alkaline Phosphatase: 168 U/L — ABNORMAL HIGH (ref 39–117)
BUN: 18 mg/dL (ref 6–23)
CO2: 30 mEq/L (ref 19–32)
Calcium: 8.6 mg/dL (ref 8.4–10.5)
Chloride: 100 mEq/L (ref 96–112)
Creatinine, Ser: 0.48 mg/dL — ABNORMAL LOW (ref 0.50–1.10)
GFR calc Af Amer: >90 mL/min (ref >90)
GFR calc non Af Amer: >90 mL/min (ref >90)
Glucose, Bld: 99 mg/dL (ref 70–99)
Potassium: 3.7 mEq/L (ref 3.5–5.1)
Sodium: 139 mEq/L (ref 135–145)
Total Bilirubin: 0.6 mg/dL (ref 0.3–1.2)
Total Protein: 6 g/dL (ref 6.0–8.3)

## 2013-01-08 LAB — GLUCOSE, CAPILLARY
Glucose-Capillary: 118 mg/dL — ABNORMAL HIGH (ref 70–99)
Glucose-Capillary: 94 mg/dL (ref 70–99)
Glucose-Capillary: 87 mg/dL (ref 70–99)

## 2013-01-08 LAB — LIPASE, BLOOD: Lipase: 45 U/L (ref 11–59)

## 2013-01-08 MED ORDER — FENTANYL CITRATE 0.05 MG/ML IJ SOLN
50.0000 ug | Freq: Once | INTRAMUSCULAR | Status: AC
  Administered 2013-01-08: 50 ug via INTRAVENOUS

## 2013-01-08 MED ORDER — FUROSEMIDE 10 MG/ML IJ SOLN
40.0000 mg | Freq: Once | INTRAMUSCULAR | Status: AC
  Administered 2013-01-08: 40 mg via INTRAVENOUS
  Filled 2013-01-08: qty 4

## 2013-01-08 MED ORDER — LORAZEPAM 2 MG/ML IJ SOLN: 1.0000 mg | Freq: Once | INTRAMUSCULAR | Status: DC

## 2013-01-08 MED ORDER — ALBUTEROL SULFATE (5 MG/ML) 0.5% IN NEBU
5.0000 mg | INHALATION_SOLUTION | RESPIRATORY_TRACT | Status: DC | PRN
  Administered 2013-01-08: 2.5 mg via RESPIRATORY_TRACT
  Administered 2013-01-08 – 2013-01-17 (×3): 5 mg via RESPIRATORY_TRACT
  Filled 2013-01-08: qty 0.5
  Filled 2013-01-08: qty 1
  Filled 2013-01-08: qty 0.5

## 2013-01-08 MED ORDER — FENTANYL CITRATE 0.05 MG/ML IJ SOLN
25.0000 ug | INTRAMUSCULAR | Status: DC | PRN
  Administered 2013-01-08 – 2013-01-10 (×18): 50 ug via INTRAVENOUS
  Filled 2013-01-08 (×20): qty 2

## 2013-01-08 NOTE — Progress Notes (Signed)
GASTROENTEROLOGY PROGRESS NOTE  Problem:   Gallstone pancreatitis  Subjective: Severe left lateral chest pain, which is pleuritic in character but also tender to the touch. Developed hypoxic respiratory failure suggest a, thought to be due to multiple factors  Objective: Patient is on a nonrebreather mask, in no severe respiratory distress. Severely obese. Anicteric. Moderate tenderness to even light palpation of the left lateral chest wall. No evident shingles rash or other explanation present.  Labs markedly improved. White count has fallen from 19,000 to a current level of 14,000 over the past 48 hours. Her lipase has fallen from roughly 3000 to a normal level of 45 over the same period of time. Liver chemistries are almost back to normal. Bilirubin is normal, transaminases in the 50 range.  Chest x-ray does show some airspace disease.  Assessment: Active pancreatitis appears to have resolved based on her improvement and laboratory parameters. There is discordance between her pain and her tenderness (the former eating pleuritic, the latter being sensitive to light outpatient), but it is nonetheless possible that the patient is having evolution of her pancreatitis with further edema over time, to account for her pain and diminished respiratory status.  Plan: We will follow with you but at the moment I do not have any additional recommendations.  Florencia Reasons, M.D. 01/08/2013 6:33 PM
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
## 2013-01-08 NOTE — Progress Notes (Addendum)
[facility] PROGRESS NOTE  Assessment/Plan: Acute respiratory failure: - CT angio 1.12.2014 ( for left flank pain): negative for PE, thickening of the peribronchovascular  interstitium and some patchy air space disease bilaterally ? Concern for ARDS.  - Saturations low through the night, now on a non rebreathe 90% .  Consult PCCM, transfer to ICU. - She continue to be positive and her Blood pressure border line low.  Pancreatitis/Common biliary duct obstruction (01/04/2013) - LFT's WNL WBC are trending down. Pt is still anorexic. - Ct abdomen distal obstruction of the common bile duct, and possibly the pancreatic duct, with acute pancreatitis and possible acute cholecystitis. Appreciate Surgery & GI assistance. - Started zosyn 1.11.2014. Continues to be afebrile. - IV fluids, narcotics for pain. Place Foley cath,  - KUB 1.13.2014 no ileus. nad no had BM continue miralax.  Hyperkalemia: - resolved.  Code Status: full Family Communication: none  Disposition Plan: home TBD   Consultants:  Dr. Ewing Schlein  surgery  Procedures:  Lap choli:   CT angio chest : 1.12.2014: negative for PE, thickening of the peribronchovascular  interstitium and some patchy air space disease bilaterally.  KUB no Ileus  Antibiotics:  Zosyn 1.11.2014  HPI/Subjective: Still having pain, anorexic feels SOB.  Objective: Filed Vitals:   01/08/13 0250 01/08/13 0400 01/08/13 0552 01/08/13 0802  BP: 132/68  144/65   Pulse: 96  98   Temp: 99.1 F (37.3 C)  99.7 F (37.6 C)   TempSrc:      Resp: 16 15 17 18   Height:      Weight:      SpO2: 92% 92% 95% 44%    Intake/Output Summary (Last 24 hours) at 01/08/13 0832 Last data filed at 01/08/13 0500  Gross per 24 hour  Intake   3155 ml  Output   1700 ml  Net   1455 ml   Filed Weights   01/05/13 0001  Weight: 125.646 kg (277 lb)    Exam:  General: Alert, awake, oriented x3, not able to speak in full sentences. HEENT: No bruits, no  goiter.  Heart: Regular rate and rhythm, without murmurs, rubs, gallops.  Lungs: Good air movement, crackles B/L. Abdomen: Soft, murphy sign positive, rebound. Left flank tender to palpation Neuro: Grossly intact, nonfocal.   Data Reviewed: Basic Metabolic Panel:  Lab 01/08/13 1610 01/07/13 0620 01/06/13 0555 01/05/13 0455 01/04/13 1809  NA 139 139 142 142 140  K 3.7 4.4 4.3 5.2* 3.9  CL 100 102 105 104 99  CO2 30 28 28 31 30   GLUCOSE 99 107* 87 133* 110*  BUN 18 23 20 19 22   CREATININE 0.48* 0.67 0.71 0.80 0.73  CALCIUM 8.6 8.4 8.2* 9.1 10.4  MG -- -- -- 1.9 --  PHOS -- -- -- 4.1 --   Liver Function Tests:  Lab 01/08/13 0730 01/07/13 0620 01/06/13 0555 01/05/13 0455 01/04/13 1809  AST 22 28 65* 210* 218*  ALT 57* 91* 158* 279* 274*  ALKPHOS 168* 110 121* 150* 152*  BILITOT 0.6 0.6 0.9 1.3* 1.7*  PROT 6.0 5.9* 6.0 6.3 7.5  ALBUMIN 2.3* 2.5* 2.9* 3.3* 3.8    Lab 01/08/13 0730 01/07/13 0620 01/06/13 0555 01/05/13 0455 01/04/13 1809  LIPASE 45 123* 1519* >3000* >3000*  AMYLASE -- -- -- -- --   No results found for this basename: AMMONIA:5 in the last 168 hours CBC:  Lab 01/08/13 0730 01/07/13 0620 01/06/13 0555 01/05/13 0455 01/04/13 1809  WBC 14.0* 17.8* 19.4* 10.4 8.8  NEUTROABS -- 15.8* 17.2* -- 6.6  HGB 12.1 13.0 14.2 14.4 14.3  HCT 37.8 41.2 44.6 44.8 44.3  MCV 92.6 93.4 92.7 91.2 89.0  PLT 227 203 207 226 220   Cardiac Enzymes: No results found for this basename: CKTOTAL:5,CKMB:5,CKMBINDEX:5,TROPONINI:5 in the last 168 hours BNP (last 3 results) No results found for this basename: PROBNP:3 in the last 8760 hours CBG: No results found for this basename: GLUCAP:5 in the last 168 hours  No results found for this or any previous visit (from the past 240 hour(s)).   Studies: Ct Angio Chest Pe W/cm &/or Wo Cm  01/07/2013  *RADIOLOGY REPORT*  Clinical Data: Left-sided flank pain.  Pain with inspiration.  CT ANGIOGRAPHY CHEST  Technique:  Multidetector CT imaging  of the chest using the standard protocol during bolus administration of intravenous contrast. Multiplanar reconstructed images including MIPs were obtained and reviewed to evaluate the vascular anatomy.  Contrast: 80mL OMNIPAQUE IOHEXOL 350 MG/ML SOLN  Comparison: Chest CT 09/03/2011.  Findings:  Mediastinum: Study is limited by large amount of patient respiratory motion.  With these limitations in mind, there is no evidence of central, lobar or proximal segmental sized pulmonary embolism.  Smaller distal segmental and subsegmental sized emboli cannot be completely excluded secondary to respiratory motion. Heart size is normal. There is no significant pericardial fluid, thickening or pericardial calcification.  No pathologically enlarged mediastinal or hilar lymph nodes. Esophagus is unremarkable in appearance.  Lungs/Pleura: Moderate left and trace right-sided pleural effusions.  Passive atelectasis in the dependent portion of the left lower lobe.  In addition, there is diffuse bronchial wall thickening with some thickening of the peribronchovascular interstitium and some patchy air space disease throughout the lungs bilaterally, predominately ground-glass in attenuation, suspicious for multifocal infection and/or inflammation.  This is relatively asymmetrically distributed.  Upper Abdomen: Perisplenic ascites.  Large amount of inflammatory changes in the retroperitoneum in the expected location of the tail of the pancreas, incompletely visualized.  Musculoskeletal: There are no aggressive appearing lytic or blastic lesions noted in the visualized portions of the skeleton.  IMPRESSION: 1.  Limited examination secondary to respiratory motion demonstrating no central, lobar or proximal segmental sized pulmonary embolism. 2.  The appearance of the lung parenchyma, as above, suggests multifocal infection and/or inflammation.  Specifically, the findings are most concerning for early changes of ARDS related to underlying  pancreatitis. 3.  Moderate left and trace right-sided pleural effusions. 4.  Changes of pancreatitis incompletely visualized in the upper abdomen.   Original Report Authenticated By: Trudie Reed, M.D.    Dg Abd Portable 1v  01/08/2013  *RADIOLOGY REPORT*  Clinical Data: Pancreatitis, abdominal pain and vomiting.  PORTABLE ABDOMEN - 1 VIEW  Comparison: CT of the abdomen and pelvis from 01/05/2013.  Findings: There is no evidence of bowel obstruction or significant ileus.  No gross signs of free air.  Extensive lumbar fusion hardware noted.  No abnormal calcifications identified.  IMPRESSION: No acute findings.   Original Report Authenticated By: Irish Lack, M.D.     Scheduled Meds:    . gabapentin  300 mg Oral TID  . HYDROmorphone PCA 0.3 mg/mL   Intravenous Q4H  . pantoprazole (PROTONIX) IV  40 mg Intravenous QHS  . piperacillin-tazobactam (ZOSYN)  IV  3.375 g Intravenous Q8H  . polyethylene glycol  17 g Oral Daily   Continuous Infusions:    . sodium chloride 50 mL/hr at 01/08/13 0802     Marinda Elk  [facility] Pager [phone].  If 8PM-8AM, please contact night-coverage at [email or web address], password [facility] 01/08/2013, 8:32 AM  LOS: 4 days

## 2013-01-08 NOTE — Progress Notes (Signed)
Received patient with O2 sa at 85 % on 3 LPM, O2 increased to 4LPM O2 sat still 88%. Vital signs  Taken and recorded, MD notified.

## 2013-01-08 NOTE — Progress Notes (Signed)
Subjective: Pt not feeling well, having trouble breathing this AM and episodes of desaturation.  Pt c/o left flank pain and upper abdominal pain.  Pt ate a small amount of food/liquid this am.  No BM, now with newly inserted catheter.    Objective: Vital signs in last 24 hours: Temp:  [98 F (36.7 C)-99.7 F (37.6 C)] 98.5 F (36.9 C) (01/13 0835) Pulse Rate:  [85-98] 91  (01/13 0835) Resp:  [11-20] 18  (01/13 0835) BP: (104-144)/(54-68) 104/54 mmHg (01/13 0835) SpO2:  [88 %-95 %] 88 % (01/13 0835) Last BM Date: 01/04/13  Intake/Output from previous day: 01/12 0701 - 01/13 0700 In: 3155 [P.O.:60; I.V.:2945; IV Piggyback:150] Out: 1700 [Urine:1700] Intake/Output this shift:    PE: Gen:  Alert, NAD, pleasant Card:  RRR, no M/G/R heard Pulm:  Low effort Abd: Soft, distended, pain especially over left flank and upper abdomen, decreased BS, no HSM  Lab Results:   Basename 01/08/13 0730 01/07/13 0620  WBC 14.0* 17.8*  HGB 12.1 13.0  HCT 37.8 41.2  PLT 227 203   BMET  Basename 01/08/13 0730 01/07/13 0620  NA 139 139  K 3.7 4.4  CL 100 102  CO2 30 28  GLUCOSE 99 107*  BUN 18 23  CREATININE 0.48* 0.67  CALCIUM 8.6 8.4   PT/INR No results found for this basename: LABPROT:2,INR:2 in the last 72 hours CMP     Component Value Date/Time   NA 139 01/08/2013 0730   K 3.7 01/08/2013 0730   CL 100 01/08/2013 0730   CO2 30 01/08/2013 0730   GLUCOSE 99 01/08/2013 0730   BUN 18 01/08/2013 0730   CREATININE 0.48* 01/08/2013 0730   CALCIUM 8.6 01/08/2013 0730   PROT 6.0 01/08/2013 0730   ALBUMIN 2.3* 01/08/2013 0730   AST 22 01/08/2013 0730   ALT 57* 01/08/2013 0730   ALKPHOS 168* 01/08/2013 0730   BILITOT 0.6 01/08/2013 0730   GFRNONAA >90 01/08/2013 0730   GFRAA >90 01/08/2013 0730   Lipase     Component Value Date/Time   LIPASE 45 01/08/2013 0730       Studies/Results: Ct Angio Chest Pe W/cm &/or Wo Cm  01/07/2013  *RADIOLOGY REPORT*  Clinical Data: Left-sided flank  pain.  Pain with inspiration.  CT ANGIOGRAPHY CHEST  Technique:  Multidetector CT imaging of the chest using the standard protocol during bolus administration of intravenous contrast. Multiplanar reconstructed images including MIPs were obtained and reviewed to evaluate the vascular anatomy.  Contrast: 80mL OMNIPAQUE IOHEXOL 350 MG/ML SOLN  Comparison: Chest CT 09/03/2011.  Findings:  Mediastinum: Study is limited by large amount of patient respiratory motion.  With these limitations in mind, there is no evidence of central, lobar or proximal segmental sized pulmonary embolism.  Smaller distal segmental and subsegmental sized emboli cannot be completely excluded secondary to respiratory motion. Heart size is normal. There is no significant pericardial fluid, thickening or pericardial calcification.  No pathologically enlarged mediastinal or hilar lymph nodes. Esophagus is unremarkable in appearance.  Lungs/Pleura: Moderate left and trace right-sided pleural effusions.  Passive atelectasis in the dependent portion of the left lower lobe.  In addition, there is diffuse bronchial wall thickening with some thickening of the peribronchovascular interstitium and some patchy air space disease throughout the lungs bilaterally, predominately ground-glass in attenuation, suspicious for multifocal infection and/or inflammation.  This is relatively asymmetrically distributed.  Upper Abdomen: Perisplenic ascites.  Large amount of inflammatory changes in the retroperitoneum in the expected location of the  tail of the pancreas, incompletely visualized.  Musculoskeletal: There are no aggressive appearing lytic or blastic lesions noted in the visualized portions of the skeleton.  IMPRESSION: 1.  Limited examination secondary to respiratory motion demonstrating no central, lobar or proximal segmental sized pulmonary embolism. 2.  The appearance of the lung parenchyma, as above, suggests multifocal infection and/or inflammation.   Specifically, the findings are most concerning for early changes of ARDS related to underlying pancreatitis. 3.  Moderate left and trace right-sided pleural effusions. 4.  Changes of pancreatitis incompletely visualized in the upper abdomen.   Original Report Authenticated By: Trudie Reed, M.D.    Dg Abd Portable 1v  01/08/2013  *RADIOLOGY REPORT*  Clinical Data: Pancreatitis, abdominal pain and vomiting.  PORTABLE ABDOMEN - 1 VIEW  Comparison: CT of the abdomen and pelvis from 01/05/2013.  Findings: There is no evidence of bowel obstruction or significant ileus.  No gross signs of free air.  Extensive lumbar fusion hardware noted.  No abnormal calcifications identified.  IMPRESSION: No acute findings.   Original Report Authenticated By: Irish Lack, M.D.     Anti-infectives: Anti-infectives     Start     Dose/Rate Route Frequency Ordered Stop   01/06/13 1100  piperacillin-tazobactam (ZOSYN) IVPB 3.375 g       3.375 g 12.5 mL/hr over 240 Minutes Intravenous 3 times per day 01/06/13 1045             Assessment/Plan 1. Gallstone pancreatitis: labs improving but still with lots of pain, will need cholecystectomy once pancreatitis resolves and once symptoms improve  --Cont current treatment  --Labs continue to improve, lipase normal, ALT improved (57), alk phos higher (168) --Leukocytosis improving --Likely be ready for surgery tomorrow, NPO except ice chips and NPO after midnight  2.  Acute respiratory failure:  ?Early ARDS on CT Scan vs atelectasis - cont duonebs and IS --per medicine service being transferred to ICU and CCM to follow  3.  Hypokalemia - resolved    LOS: 4 days    DORT, Aundra Millet 01/08/2013, 9:23 AM Pager: [phone]

## 2013-01-08 NOTE — Progress Notes (Signed)
Report given to 2100 nurse Shanda Bumps, RN

## 2013-01-08 NOTE — Progress Notes (Signed)
Pt has developed ARDS probably secondary to pancreatitis. Her lung injury will need to improve before we can safely take her to operating room to remove gallbladder. Will follow

## 2013-01-08 NOTE — Progress Notes (Signed)
Patient transferred to 2116.

## 2013-01-08 NOTE — Progress Notes (Signed)
Md paged for pain consult. Patient still complaining of pain despite prn med. New order given.

## 2013-01-08 NOTE — Progress Notes (Signed)
Hypoxia.  Bibasilar rales.  I/O=2,800.  Hemodynamically stable. IVF d/c'd.  Lasix 40 x 1.

## 2013-01-09 ENCOUNTER — Inpatient Hospital Stay (HOSPITAL_COMMUNITY): Payer: Medicare Other

## 2013-01-09 LAB — GLUCOSE, CAPILLARY
Glucose-Capillary: 100 mg/dL — ABNORMAL HIGH (ref 70–99)
Glucose-Capillary: 97 mg/dL (ref 70–99)

## 2013-01-09 LAB — CBC WITH DIFFERENTIAL/PLATELET
Basophils Absolute: 0 10*3/uL (ref 0.0–0.1)
Eosinophils Absolute: 0.1 10*3/uL (ref 0.0–0.7)
Eosinophils Relative: 1 % (ref 0–5)
HCT: 38.7 % (ref 36.0–46.0)
Lymphocytes Relative: 8 % — ABNORMAL LOW (ref 12–46)
MCH: 29.5 pg (ref 26.0–34.0)
MCV: 92.1 fL (ref 78.0–100.0)
Monocytes Absolute: 1.1 10*3/uL — ABNORMAL HIGH (ref 0.1–1.0)
Platelets: 249 10*3/uL (ref 150–400)
RDW: 14 % (ref 11.5–15.5)

## 2013-01-09 LAB — BASIC METABOLIC PANEL
BUN: 20 mg/dL (ref 6–23)
Calcium: 8.8 mg/dL (ref 8.4–10.5)
Creatinine, Ser: 0.46 mg/dL — ABNORMAL LOW (ref 0.50–1.10)
GFR calc Af Amer: >90 mL/min (ref >90)
GFR calc non Af Amer: >90 mL/min (ref >90)
Glucose, Bld: 97 mg/dL (ref 70–99)

## 2013-01-09 LAB — HEPATIC FUNCTION PANEL
ALT: 39 U/L — ABNORMAL HIGH (ref 0–35)
AST: 17 U/L (ref 0–37)
Bilirubin, Direct: 0.2 mg/dL (ref 0.0–0.3)
Indirect Bilirubin: 0.2 mg/dL — ABNORMAL LOW (ref 0.3–0.9)
Total Bilirubin: 0.4 mg/dL (ref 0.3–1.2)

## 2013-01-09 LAB — LIPASE, BLOOD: Lipase: 35 U/L (ref 11–59)

## 2013-01-09 MED ORDER — POTASSIUM CHLORIDE 20 MEQ/15ML (10%) PO LIQD
40.0000 meq | Freq: Two times a day (BID) | ORAL | Status: AC
  Administered 2013-01-09 – 2013-01-10 (×2): 40 meq via ORAL
  Filled 2013-01-09 (×2): qty 30

## 2013-01-09 MED ORDER — POTASSIUM CHLORIDE 20 MEQ/15ML (10%) PO LIQD
ORAL | Status: AC
  Filled 2013-01-09: qty 30

## 2013-01-09 MED ORDER — POTASSIUM CHLORIDE 10 MEQ/100ML IV SOLN
10.0000 meq | INTRAVENOUS | Status: DC
  Administered 2013-01-09: 10 meq via INTRAVENOUS
  Filled 2013-01-09: qty 100

## 2013-01-09 MED ORDER — LORAZEPAM 2 MG/ML IJ SOLN
INTRAMUSCULAR | Status: AC
  Filled 2013-01-09: qty 1

## 2013-01-09 MED ORDER — LORAZEPAM 2 MG/ML IJ SOLN
0.5000 mg | Freq: Once | INTRAMUSCULAR | Status: AC
  Administered 2013-01-09: 0.5 mg via INTRAVENOUS

## 2013-01-09 NOTE — Progress Notes (Signed)
White count and liver chemistries show continued improvement. No evidence of ongoing biliary obstruction.  Her left flank and lateral chest wall pain continues, but the exam of that area is somewhat non-physiologic, where the patient breaks in the tears with just moderate palpation, but there are no palpable or visible abnormalities in that area.  It is my understanding that a cholecystectomy is anticipated in the near future. We will sign off at this time, but would gladly reactivate on the patient if a common duct stone is found at surgery, or if we can be of further assistance in her care.  With respect to colon cancer screening, I feel that this morbidly obese patient with multiple significant medical underlying problems is probably not a good candidate for screening colonoscopy and I would defer to her primary physician whether he wants to screen with Hemoccults and/or a barium enema.

## 2013-01-09 NOTE — Progress Notes (Signed)
Patient ID: [name] [name], female   DOB: [birth year], 57 y.o.   MRN: [id number]    Subjective: Pt continues to complain about left flank/back.  Actually relates that this is more of a chronic issue as she has had multiple back surgeries and this happens to her at times, she usually uses ice and heat to help, she denies n/v or distinct abd pain.  Objective: Vital signs in last 24 hours: Temp:  [97.5 F (36.4 C)-99.2 F (37.3 C)] 98.5 F (36.9 C) (01/14 0441) Pulse Rate:  [78-100] 78  (01/14 0500) Resp:  [15-26] 16  (01/14 0500) BP: (104-140)/(51-91) 138/59 mmHg (01/14 0500) SpO2:  [88 %-100 %] 100 % (01/14 0500) FiO2 (%):  [100 %] 100 % (01/13 1953) Weight:  [305 lb 12.5 oz (138.7 kg)] 305 lb 12.5 oz (138.7 kg) (01/13 1214) Last BM Date: 01/04/13  Intake/Output from previous day: 01/13 0701 - 01/14 0700 In: 750 [I.V.:700; IV Piggyback:50] Out: 3300 [Urine:3300] Intake/Output this shift:    PE: Abd: soft, tender only around the left flank and back, +BS Lungs: coarse esp in bases with decreased effort Heart: RRR  Lab Results:   Basename 01/08/13 0730 01/07/13 0620  WBC 14.0* 17.8*  HGB 12.1 13.0  HCT 37.8 41.2  PLT 227 203   BMET  Basename 01/08/13 0730 01/07/13 0620  NA 139 139  K 3.7 4.4  CL 100 102  CO2 30 28  GLUCOSE 99 107*  BUN 18 23  CREATININE 0.48* 0.67  CALCIUM 8.6 8.4   PT/INR No results found for this basename: LABPROT:2,INR:2 in the last 72 hours CMP     Component Value Date/Time   NA 139 01/08/2013 0730   K 3.7 01/08/2013 0730   CL 100 01/08/2013 0730   CO2 30 01/08/2013 0730   GLUCOSE 99 01/08/2013 0730   BUN 18 01/08/2013 0730   CREATININE 0.48* 01/08/2013 0730   CALCIUM 8.6 01/08/2013 0730   PROT 6.0 01/08/2013 0730   ALBUMIN 2.3* 01/08/2013 0730   AST 22 01/08/2013 0730   ALT 57* 01/08/2013 0730   ALKPHOS 168* 01/08/2013 0730   BILITOT 0.6 01/08/2013 0730   GFRNONAA >90 01/08/2013 0730   GFRAA >90 01/08/2013 0730   Lipase     Component Value  Date/Time   LIPASE 45 01/08/2013 0730       Studies/Results: Ct Angio Chest Pe W/cm &/or Wo Cm  01/07/2013  *RADIOLOGY REPORT*  Clinical Data: Left-sided flank pain.  Pain with inspiration.  CT ANGIOGRAPHY CHEST  Technique:  Multidetector CT imaging of the chest using the standard protocol during bolus administration of intravenous contrast. Multiplanar reconstructed images including MIPs were obtained and reviewed to evaluate the vascular anatomy.  Contrast: 80mL OMNIPAQUE IOHEXOL 350 MG/ML SOLN  Comparison: Chest CT 09/03/2011.  Findings:  Mediastinum: Study is limited by large amount of patient respiratory motion.  With these limitations in mind, there is no evidence of central, lobar or proximal segmental sized pulmonary embolism.  Smaller distal segmental and subsegmental sized emboli cannot be completely excluded secondary to respiratory motion. Heart size is normal. There is no significant pericardial fluid, thickening or pericardial calcification.  No pathologically enlarged mediastinal or hilar lymph nodes. Esophagus is unremarkable in appearance.  Lungs/Pleura: Moderate left and trace right-sided pleural effusions.  Passive atelectasis in the dependent portion of the left lower lobe.  In addition, there is diffuse bronchial wall thickening with some thickening of the peribronchovascular interstitium and some patchy air space disease throughout the  lungs bilaterally, predominately ground-glass in attenuation, suspicious for multifocal infection and/or inflammation.  This is relatively asymmetrically distributed.  Upper Abdomen: Perisplenic ascites.  Large amount of inflammatory changes in the retroperitoneum in the expected location of the tail of the pancreas, incompletely visualized.  Musculoskeletal: There are no aggressive appearing lytic or blastic lesions noted in the visualized portions of the skeleton.  IMPRESSION: 1.  Limited examination secondary to respiratory motion demonstrating no  central, lobar or proximal segmental sized pulmonary embolism. 2.  The appearance of the lung parenchyma, as above, suggests multifocal infection and/or inflammation.  Specifically, the findings are most concerning for early changes of ARDS related to underlying pancreatitis. 3.  Moderate left and trace right-sided pleural effusions. 4.  Changes of pancreatitis incompletely visualized in the upper abdomen.   Original Report Authenticated By: Trudie Reed, M.D.    Dg Chest Port 1 View  01/08/2013  *RADIOLOGY REPORT*  Clinical Data: Hypoxia.  PORTABLE CHEST - 1 VIEW  Comparison: CT chest 01/07/2013 and chest radiograph 01/01/2011.  Findings: Trachea is midline.  Heart size is accentuated by AP semi upright technique and low lung volumes.  There is central pulmonary vascular congestion with left perihilar and left lower lobe air space disease.  Mild diffuse interstitial prominence.  Small left pleural effusion.  IMPRESSION: Bilateral air space disease, left greater than right.  Left upper/left lower lobe pneumonia is queried. Probable underlying edema.   Original Report Authenticated By: Leanna Battles, M.D.    Dg Abd Portable 1v  01/08/2013  *RADIOLOGY REPORT*  Clinical Data: Pancreatitis, abdominal pain and vomiting.  PORTABLE ABDOMEN - 1 VIEW  Comparison: CT of the abdomen and pelvis from 01/05/2013.  Findings: There is no evidence of bowel obstruction or significant ileus.  No gross signs of free air.  Extensive lumbar fusion hardware noted.  No abnormal calcifications identified.  IMPRESSION: No acute findings.   Original Report Authenticated By: Irish Lack, M.D.     Anti-infectives: Anti-infectives     Start     Dose/Rate Route Frequency Ordered Stop   01/06/13 1100  piperacillin-tazobactam (ZOSYN) IVPB 3.375 g       3.375 g 12.5 mL/hr over 240 Minutes Intravenous 3 times per day 01/06/13 1045             Assessment/Plan 1. Acute gallstone pancreatitis: LFTs normalized and can have  lap chole once respiratory status improves.  Currently has picture of ARDS and critical care is managing, may be transferred back to floor today, if continues to improve can potentially go to OR tomorrow.  Chronic back pain which may have some pleuritic aspect to it but very tender to the touch, will order ice and heat as this is her treatment of choice at home.  --?OR tomorrow  --continue abx and resp care per CCM  --NPO after MN tonight  LOS: 5 days    Sotero Brinkmeyer 01/09/2013

## 2013-01-09 NOTE — Progress Notes (Signed)
eLink Physician-Brief Progress Note Patient Name: DAKISHA SCHOOF DOB: [birth year] MRN: [id number]  Date of Service  01/09/2013   HPI/Events of Note   RN says patient anxious and pain is not the issue. On camera exam, not in resp distress but seems anxious v pain  eICU Interventions  Ativan 0,5mg  x 1 IV      Ariahna Smiddy 01/09/2013, 2:09 AM

## 2013-01-09 NOTE — Progress Notes (Signed)
Name: [name] [name] MRN: [id number] DOB: [birth year] LOS: 5  PCCM RESIDENT DAILY PROGRESS NOTE  History of Present Illness: 57 y.o PMH asthma, neuromuscular d/o, pancreatitis, pneumonia, rectal bleeding, migraines, arthritis, abdominal hernia.  She presented 1/9 with gallstone pancreatitis lipase >3000 (now nl), AST 218 (now nl), ALT 274 still elevated 57.  On 1/13 she was transferred to [facility] for hypoxic respiratory failure.    Lines / Drains: none  Cultures: 1/13 MRSA negative   Antibiotics: 1/11 Zosyn>>  Tests / Events: 1/10 CT ab pelvis: distal obstruction CBD and possible pancreatic duct. Acute pancreatitis and cholecystitis, ?stricture. Diverticulosis. Ventral hernia inferior. No bowel obstruction.  1/12 CTA-multifocal basilar disease. Moderate left and trace right pleural effusion  Overnight Events/subjective:  Given Ativan overnight.  No acute events. C/o 7/10 left lateral mid back pain.  This is chronic and she takes Robaxin at home.  No bowel movement since last Thursday.   Vital Signs: Temp:  [97.2 F (36.2 C)-99.2 F (37.3 C)] 97.2 F (36.2 C) (01/14 0854) Pulse Rate:  [78-100] 85  (01/14 1100) Resp:  [15-26] 15  (01/14 1100) BP: (113-152)/(52-110) 152/110 mmHg (01/14 1100) SpO2:  [93 %-100 %] 93 % (01/14 1100) FiO2 (%):  [100 %] 100 % (01/13 1953) Weight:  [305 lb 12.5 oz (138.7 kg)] 305 lb 12.5 oz (138.7 kg) (01/13 1214) I/O last 3 completed shifts: In: 820 [I.V.:720; IV Piggyback:100] Out: 3600 [Urine:3600]  Physical Examination: General:  Lying in bed, nad, arousable Neuro:  Oriented x 3  HEENT:  [place]/at Cardiovascular:  RRR, no r/m/g Lungs:  B/l wheezes on NRB FiO2 100% Abdomen:  Obese, hypoactive bs, nd, mild ttp epigastric, left flank Musculoskeletal:  Left lateral mid back with mod ttp Skin:  Intact    Labs and Imaging:   Basic Metabolic Panel:  Lab 01/09/13 4098 01/08/13 0730 01/05/13 0455  NA 142 139 --  K 3.3* 3.7 --  CL 100 100 --  CO2 32 30  --  GLUCOSE 97 99 --  BUN 20 18 --  CREATININE 0.46* 0.48* --  CALCIUM 8.8 8.6 --  MG 2.5 -- 1.9  PHOS 1.7* -- 4.1   Liver Function Tests:  Lab 01/09/13 0818 01/08/13 0730  AST 17 22  ALT 39* 57*  ALKPHOS 150* 168*  BILITOT 0.4 0.6  PROT 6.1 6.0  ALBUMIN 2.1* 2.3*    Lab 01/09/13 0818 01/08/13 0730  LIPASE 35 45  AMYLASE -- --  CBC:  Lab 01/09/13 0818 01/08/13 0730 01/07/13 0620  WBC 12.1* 14.0* --  NEUTROABS 9.9* -- 15.8*  HGB 12.4 12.1 --  HCT 38.7 37.8 --  MCV 92.1 92.6 --  PLT 249 227 --   CBG:  Lab 01/09/13 0846 01/09/13 0438 01/09/13 0004 01/08/13 2004 01/08/13 1630 01/08/13 1216  GLUCAP 111* 97 100* 87 94 118*   Fasting Lipid Panel:  Lab 01/05/13 0455  CHOL 225*  HDL 73  LDLCALC 143*  TRIG 45  CHOLHDL 3.1  LDLDIRECT --   Thyroid Function Tests:  Lab 01/05/13 0455  TSH 3.425  T4TOTAL --  FREET4 --  T3FREE --  THYROIDAB --   Urinalysis:  Lab 01/04/13 1839  COLORURINE YELLOW  LABSPEC 1.015  PHURINE 5.5  GLUCOSEU NEGATIVE  HGBUR NEGATIVE  BILIRUBINUR NEGATIVE  KETONESUR NEGATIVE  PROTEINUR NEGATIVE  UROBILINOGEN 1.0  NITRITE NEGATIVE  LEUKOCYTESUR NEGATIVE   Misc. Labs: Pending labs today   Assessment and Plan: PULMONARY 1/14 CXR  IMPRESSION:  Cardiomegaly with mild to moderate  interstitial edema, increased.  Moderate layering left pleural effusion.  Underlying left upper lobe pneumonia not excluded.   ASSESSMENT: Acute hypoxic respiratory failure-improved; Likely multifactorial due to obesity with left pleural effusion / ? ALI due to acute pancreatitis  Remote smoker quit 1995   ?OSA ? Pulmonary function  PLAN:   CTA chest reviewed  Transition NRB to venti mask, [place] today Consider outpatient sleep study and pfts Prn Albuterol Defer thoracentesis for now  CARDIOVASCULAR ASSESSMENT:  No acute issues. BP 138/59 PLAN:  Monitor VS. Monitor i/o  RENAL ASSESSMENT:   hypoK 3.3   PLAN:   K repleted     GASTROINTESTINAL ASSESSMENT:   Gallstone pancreatitis, improving Constipation GI px   PLAN:   CT ab/pelvis reviewed. lfts trending down Prn Fentanyl 25-50 mcg q 2  Will advance diet to liq as tolerated Soap suds enema, Miralax  Protonix   HEMATOLOGIC ASSESSMENT:   Leukocytosis trending down  PLAN:  Trend cbc  INFECTIOUS ASSESSMENT:   Afebrile x 24 hours   PLAN:   Zosyn since 1/11 to continue  ENDOCRINE ASSESSMENT:   fsbs controlled <180   PLAN:   Monitor cbg   NEUROLOGIC ASSESSMENT:   Alert and oriented x 3  PLAN:   Monitor MS  CLINICAL SUMMARY: 57 y.o with multifactorial resolved acute hypoxic respiratory failure with left pleural effusion secondary to acute gallstone pancreatitis.  Resp status doing better transition to venti mask and [place].  Will transfer to [facility]  Best practices / Disposition: -->ICU status under PCCM -->full code -->scds for DVT Px -->Protonix for GI Px -->diet clear liq   Annett Gula [phone] 01/09/2013, 11:47 AM  Care during the described time interval was provided by me and/or other providers on the critical care team.  I have reviewed this patient's available data, including medical history, events of note, physical examination and test results as part of my evaluation  Ameera Tigue V.

## 2013-01-10 ENCOUNTER — Inpatient Hospital Stay (HOSPITAL_COMMUNITY): Payer: Medicare Other

## 2013-01-10 LAB — COMPREHENSIVE METABOLIC PANEL
Alkaline Phosphatase: 118 U/L — ABNORMAL HIGH (ref 39–117)
BUN: 19 mg/dL (ref 6–23)
Calcium: 8.8 mg/dL (ref 8.4–10.5)
Creatinine, Ser: 0.39 mg/dL — ABNORMAL LOW (ref 0.50–1.10)
GFR calc Af Amer: >90 mL/min (ref >90)
Glucose, Bld: 122 mg/dL — ABNORMAL HIGH (ref 70–99)
Total Protein: 6.2 g/dL (ref 6.0–8.3)

## 2013-01-10 LAB — CBC WITH DIFFERENTIAL/PLATELET
Basophils Absolute: 0 10*3/uL (ref 0.0–0.1)
HCT: 37.2 % (ref 36.0–46.0)
Hemoglobin: 12.1 g/dL (ref 12.0–15.0)
Lymphocytes Relative: 10 % — ABNORMAL LOW (ref 12–46)
Monocytes Absolute: 1.5 10*3/uL — ABNORMAL HIGH (ref 0.1–1.0)
Neutro Abs: 10.7 10*3/uL — ABNORMAL HIGH (ref 1.7–7.7)
RDW: 13.7 % (ref 11.5–15.5)
WBC: 13.8 10*3/uL — ABNORMAL HIGH (ref 4.0–10.5)

## 2013-01-10 LAB — LIPASE, BLOOD: Lipase: 50 U/L (ref 11–59)

## 2013-01-10 MED ORDER — HYDROMORPHONE HCL PF 1 MG/ML IJ SOLN
INTRAMUSCULAR | Status: AC
  Filled 2013-01-10: qty 1

## 2013-01-10 MED ORDER — IPRATROPIUM BROMIDE 0.02 % IN SOLN
0.5000 mg | RESPIRATORY_TRACT | Status: DC
  Administered 2013-01-10 – 2013-01-11 (×6): 0.5 mg via RESPIRATORY_TRACT
  Filled 2013-01-10 (×6): qty 2.5

## 2013-01-10 MED ORDER — WHITE PETROLATUM GEL
Status: AC
  Administered 2013-01-10: 06:00:00
  Filled 2013-01-10: qty 5

## 2013-01-10 MED ORDER — BIOTENE DRY MOUTH MT LIQD
15.0000 mL | Freq: Two times a day (BID) | OROMUCOSAL | Status: DC
  Administered 2013-01-11 – 2013-01-12 (×3): 15 mL via OROMUCOSAL

## 2013-01-10 MED ORDER — CHLORHEXIDINE GLUCONATE 0.12 % MT SOLN
15.0000 mL | Freq: Two times a day (BID) | OROMUCOSAL | Status: DC
  Administered 2013-01-11 – 2013-01-12 (×3): 15 mL via OROMUCOSAL
  Filled 2013-01-10 (×3): qty 15

## 2013-01-10 MED ORDER — HYDROMORPHONE HCL PF 1 MG/ML IJ SOLN
1.0000 mg | INTRAMUSCULAR | Status: DC | PRN
  Administered 2013-01-10 (×6): 1 mg via INTRAVENOUS
  Administered 2013-01-11: 0.5 mg via INTRAVENOUS
  Administered 2013-01-11 (×4): 1 mg via INTRAVENOUS
  Filled 2013-01-10 (×10): qty 1

## 2013-01-10 MED ORDER — ALBUTEROL SULFATE (5 MG/ML) 0.5% IN NEBU
2.5000 mg | INHALATION_SOLUTION | RESPIRATORY_TRACT | Status: DC
  Administered 2013-01-10 – 2013-01-11 (×6): 2.5 mg via RESPIRATORY_TRACT
  Filled 2013-01-10 (×6): qty 0.5

## 2013-01-10 MED ORDER — SODIUM CHLORIDE 0.9 % IV SOLN
INTRAVENOUS | Status: DC
  Administered 2013-01-10 – 2013-01-13 (×5): via INTRAVENOUS
  Administered 2013-01-17: 20 mL/h via INTRAVENOUS
  Administered 2013-01-19: 01:00:00 via INTRAVENOUS

## 2013-01-10 NOTE — Progress Notes (Signed)
Subjective: The patient is complaining of a lot of pain this morning, she appears uncomfortable. She is currently NPO And only ice chips. She is passing flatus and urinating through a catheter.  She is not ambulatory at this time.    Objective: Vital signs in last 24 hours: Temp:  [96.6 F (35.9 C)-98.7 F (37.1 C)] 98.2 F (36.8 C) (01/15 0449) Pulse Rate:  [75-93] 75  (01/15 0449) Resp:  [15-28] 28  (01/15 0449) BP: (125-152)/(65-110) 136/74 mmHg (01/15 0449) SpO2:  [92 %-96 %] 94 % (01/15 0449) Last BM Date: 01/09/13  Intake/Output from previous day: 01/14 0701 - 01/15 0700 In: 697.9 [P.O.:420; I.V.:80; IV Piggyback:100] Out: 1500 [Urine:1500] Intake/Output this shift:    PE: Gen:  Alert, NAD, pleasant Abd: Soft, moderately tender in LUQ, ND, +BS, no HSM  Lab Results:   Basename 01/10/13 0500 01/09/13 0818  WBC 13.8* 12.1*  HGB 12.1 12.4  HCT 37.2 38.7  PLT 267 249   BMET  Basename 01/10/13 0500 01/09/13 0818  NA 139 142  K 3.9 3.3*  CL 100 100  CO2 31 32  GLUCOSE 122* 97  BUN 19 20  CREATININE 0.39* 0.46*  CALCIUM 8.8 8.8   PT/INR No results found for this basename: LABPROT:2,INR:2 in the last 72 hours CMP     Component Value Date/Time   NA 139 01/10/2013 0500   K 3.9 01/10/2013 0500   CL 100 01/10/2013 0500   CO2 31 01/10/2013 0500   GLUCOSE 122* 01/10/2013 0500   BUN 19 01/10/2013 0500   CREATININE 0.39* 01/10/2013 0500   CALCIUM 8.8 01/10/2013 0500   PROT 6.2 01/10/2013 0500   ALBUMIN 2.0* 01/10/2013 0500   AST 16 01/10/2013 0500   ALT 33 01/10/2013 0500   ALKPHOS 118* 01/10/2013 0500   BILITOT 0.3 01/10/2013 0500   GFRNONAA >90 01/10/2013 0500   GFRAA >90 01/10/2013 0500   Lipase     Component Value Date/Time   LIPASE 50 01/10/2013 0500       Studies/Results: Dg Chest Port 1 View  01/10/2013  *RADIOLOGY REPORT*  Clinical Data: Follow-up  PORTABLE CHEST - 1 VIEW  Comparison: 01/09/2013  Findings: .  Lung volumes remain low.  To cardiac collapse  / consolidation persists with left pleural effusion.  Vascular congestion again noted.  The radius trace amount of fluid in the minor fissure.  IMPRESSION: Slight improvement in overall aeration with persistent retrocardiac collapse / consolidation with left effusion.   Original Report Authenticated By: Eric Mansell, M.D.    Dg Chest Port 1 View  01/09/2013  *RADIOLOGY REPORT*  Clinical Data: Follow up  PORTABLE CHEST - 1 VIEW  Comparison: None.  Findings: Cardiomegaly with mild to moderate interstitial edema, increased.  Worsening opacification of the left mid lung, underlying pneumonia not excluded.  Moderate layering left pleural effusion.  No pneumothorax.  IMPRESSION: Cardiomegaly with mild to moderate interstitial edema, increased.  Moderate layering left pleural effusion.  Underlying left upper lobe pneumonia not excluded.   Original Report Authenticated By: Sriyesh Krishnan, M.D.    Dg Chest Port 1 View  01/08/2013  *RADIOLOGY REPORT*  Clinical Data: Hypoxia.  PORTABLE CHEST - 1 VIEW  Comparison: CT chest 01/07/2013 and chest radiograph 01/01/2011.  Findings: Trachea is midline.  Heart size is accentuated by AP semi upright technique and low lung volumes.  There is central pulmonary vascular congestion with left perihilar and left lower lobe air space disease.  Mild diffuse interstitial prominence.  Small left   pleural effusion.  IMPRESSION: Bilateral air space disease, left greater than right.  Left upper/left lower lobe pneumonia is queried. Probable underlying edema.   Original Report Authenticated By: Melinda Blietz, M.D.     Anti-infectives: Anti-infectives     Start     Dose/Rate Route Frequency Ordered Stop   01/06/13 1100   piperacillin-tazobactam (ZOSYN) IVPB 3.375 g  Status:  Discontinued        3.375 g 12.5 mL/hr over 240 Minutes Intravenous 3 times per day 01/06/13 1045 01/09/13 0930           Assessment/Plan 1. Gallstone pancreatitis:  --Cont current treatment - changed  pain medication because the patient is not getting pain relief from fentanyl, switch to dilaudid -- We are still concerned about the patient respiratory status and therefore we do not want to take the patient to the OR for her colecystectomy until her respiratory status is more stable, given the gallbaldder is not currently the cause of her pain --Labs are essentially normal - do not know why patients pain is out of proportion --Leukocytosis slightly up today --Cont NPO given pain, Will plan for surgery tomorrow unless we get an opening today --Re-consulted hospitalist - Dr. Gherghe's team  2. Acute respiratory failure:  cont duonebs and IS  --per medicine service, just transferred from ICU to medsurg  3. Low Phosphate: per medicine service     LOS: 6 days    DORT, Danarius Mcconathy 01/10/2013, 9:15 AM Pager: [phone]   

## 2013-01-10 NOTE — Progress Notes (Signed)
eLink Physician-Brief Progress Note Patient Name: GENTRY SEEBER DOB: 02/15/1956 MRN: [id number]  Date of Service  01/10/2013   HPI/Events of Note  RN wants to know if patient still needs tele  - reviewed case   - 2 x ekg on 01/04/13 - NSR  - admit dx - pancreatitis  - currently per RN patient stable and NSR  =- no troponin check suggesting low risk suspicion by bedside MD  - tx order ws for med-surg and not tele  eICU Interventions  Dc tele monitoring   Intervention Category Minor Interventions: Other:  Jackey Housey 01/10/2013, 12:04 AM

## 2013-01-10 NOTE — Progress Notes (Signed)
[facility] PROGRESS NOTE  [name] [name] JXB:[id number] DOB: [birth year] DOA: 01/04/2013 PCP: Kaleen Mask, MD  Assessment/Plan: Acute pancreatitis secondary to gallstones - She continues to have abdominal pain - Continue n.p.o. today plan to go to the surgical suite later this afternoon for cholecystectomy  2. Hypoxic and hypercarbic respiratory failure - Improving she is on 3 L nasal cannula satting 99%; we'll continue to monitor and wean off oxygen as tolerated  3. Abdominal pain - Likely secondary to acute pancreatitis we'll continue pain management  4. Hyperlipidemia - On admission her total cholesterol was elevated at 225, LDL at 143 - She will likely benefit from starting a lipid-lowering agent on discharge  Code Status: Full Family Communication: patient, sister in law Disposition Plan: TBD  Consultants:  Surgery Gastroenterology  Procedures:  none  Antibiotics:  Piptazo (stopped 1.14.14)  HPI/Subjective: Anxious about the surgery, would like to be in OR as soon as possible.   Objective: Filed Vitals:   01/10/13 0210 01/10/13 0449 01/10/13 1013 01/10/13 1156  BP: 138/81 136/74 137/75   Pulse: 79 75 69   Temp: 98.2 F (36.8 C) 98.2 F (36.8 C) 98.3 F (36.8 C)   TempSrc:  Oral Oral   Resp: 20 28 22    Height:      Weight:      SpO2: 93% 94% 96% 99%    Intake/Output Summary (Last 24 hours) at 01/10/13 1237 Last data filed at 01/10/13 1308  Gross per 24 hour  Intake 337.94 ml  Output    800 ml  Net -462.06 ml   Filed Weights   01/05/13 0001 01/08/13 1214  Weight: 125.646 kg (277 lb) 138.7 kg (305 lb 12.5 oz)    Exam:   General:  In mild distress 2/2 abdominal pain  Cardiovascular: RRR, no MRG, however exam limited due to the body habitus  Respiratory: coarse breath sounds  Abdomen: soft, diffusely tender to palpation mainly in the epigastric area  Data Reviewed: Basic Metabolic Panel:  Lab 01/10/13 6578 01/09/13 0818  01/08/13 0730 01/07/13 0620 01/06/13 0555 01/05/13 0455  NA 139 142 139 139 142 --  K 3.9 3.3* 3.7 4.4 4.3 --  CL 100 100 100 102 105 --  CO2 31 32 30 28 28  --  GLUCOSE 122* 97 99 107* 87 --  BUN 19 20 18 23 20  --  CREATININE 0.39* 0.46* 0.48* 0.67 0.71 --  CALCIUM 8.8 8.8 8.6 8.4 8.2* --  MG 2.2 2.5 -- -- -- 1.9  PHOS 1.2* 1.7* -- -- -- 4.1   Liver Function Tests:  Lab 01/10/13 0500 01/09/13 0818 01/08/13 0730 01/07/13 0620 01/06/13 0555  AST 16 17 22 28  65*  ALT 33 39* 57* 91* 158*  ALKPHOS 118* 150* 168* 110 121*  BILITOT 0.3 0.4 0.6 0.6 0.9  PROT 6.2 6.1 6.0 5.9* 6.0  ALBUMIN 2.0* 2.1* 2.3* 2.5* 2.9*    Lab 01/10/13 0500 01/09/13 0818 01/08/13 0730 01/07/13 0620 01/06/13 0555  LIPASE 50 35 45 123* 1519*  AMYLASE -- -- -- -- --   No results found for this basename: AMMONIA:5 in the last 168 hours CBC:  Lab 01/10/13 0500 01/09/13 0818 01/08/13 0730 01/07/13 0620 01/06/13 0555 01/04/13 1809  WBC 13.8* 12.1* 14.0* 17.8* 19.4* --  NEUTROABS 10.7* 9.9* -- 15.8* 17.2* 6.6  HGB 12.1 12.4 12.1 13.0 14.2 --  HCT 37.2 38.7 37.8 41.2 44.6 --  MCV 91.0 92.1 92.6 93.4 92.7 --  PLT 267 249 227 203 207 --  Cardiac Enzymes: No results found for this basename: CKTOTAL:5,CKMB:5,CKMBINDEX:5,TROPONINI:5 in the last 168 hours BNP (last 3 results) No results found for this basename: PROBNP:3 in the last 8760 hours CBG:  Lab 01/09/13 1208 01/09/13 0846 01/09/13 0438 01/09/13 0004 01/08/13 2004  GLUCAP 128* 111* 97 100* 87    Recent Results (from the past 240 hour(s))  MRSA PCR SCREENING     Status: Normal   Collection Time   01/08/13 12:16 PM      Component Value Range Status Comment   MRSA by PCR NEGATIVE  NEGATIVE Final      Studies: Dg Chest Port 1 View  01/10/2013  *RADIOLOGY REPORT*  Clinical Data: Follow-up  PORTABLE CHEST - 1 VIEW  Comparison: 01/09/2013  Findings: .  Lung volumes remain low.  To cardiac collapse / consolidation persists with left pleural effusion.   Vascular congestion again noted.  The radius trace amount of fluid in the minor fissure.  IMPRESSION: Slight improvement in overall aeration with persistent retrocardiac collapse / consolidation with left effusion.   Original Report Authenticated By: Kennith Center, M.D.    Dg Chest Port 1 View  01/09/2013  *RADIOLOGY REPORT*  Clinical Data: Follow up  PORTABLE CHEST - 1 VIEW  Comparison: None.  Findings: Cardiomegaly with mild to moderate interstitial edema, increased.  Worsening opacification of the left mid lung, underlying pneumonia not excluded.  Moderate layering left pleural effusion.  No pneumothorax.  IMPRESSION: Cardiomegaly with mild to moderate interstitial edema, increased.  Moderate layering left pleural effusion.  Underlying left upper lobe pneumonia not excluded.   Original Report Authenticated By: Charline Bills, M.D.     Scheduled Meds:   . ipratropium  0.5 mg Nebulization Q4H   And  . albuterol  2.5 mg Nebulization Q4H  . HYDROmorphone      . pantoprazole (PROTONIX) IV  40 mg Intravenous QHS  . polyethylene glycol  17 g Oral Daily   Continuous Infusions:   . sodium chloride 75 mL/hr at 01/10/13 1126    Principal Problem:  *Gallstone pancreatitis Active Problems:  Common biliary duct obstruction  Leukocytosis  Acute respiratory failure: hypoxic and hypercarbic  Atelectasis  Pleural effusion  Hypotension   [name]  [facility] Pager [phone]. If 8PM-8AM, please contact night-coverage at [email or web address], password [facility] [place] 01/10/2013, 12:37 PM  LOS: 6 days

## 2013-01-11 ENCOUNTER — Inpatient Hospital Stay (HOSPITAL_COMMUNITY): Payer: Medicare Other

## 2013-01-11 ENCOUNTER — Encounter (HOSPITAL_COMMUNITY)
Admission: EM | Disposition: A | Discharge status: Home / Self Care | Payer: Self-pay | Source: Home / Self Care | Attending: Internal Medicine

## 2013-01-11 ENCOUNTER — Inpatient Hospital Stay (HOSPITAL_COMMUNITY): Payer: Medicare Other | Admitting: Anesthesiology

## 2013-01-11 ENCOUNTER — Encounter (HOSPITAL_COMMUNITY): Payer: Self-pay | Admitting: Anesthesiology

## 2013-01-11 DIAGNOSIS — K8066 Calculus of gallbladder and bile duct with acute and chronic cholecystitis without obstruction: Secondary | ICD-10-CM

## 2013-01-11 HISTORY — PX: CHOLECYSTECTOMY: SHX55

## 2013-01-11 LAB — BASIC METABOLIC PANEL
CO2: 32 mEq/L (ref 19–32)
Calcium: 9 mg/dL (ref 8.4–10.5)
GFR calc non Af Amer: >90 mL/min (ref >90)
Sodium: 141 mEq/L (ref 135–145)

## 2013-01-11 LAB — CBC
Platelets: 281 10*3/uL (ref 150–400)
RBC: 4.15 MIL/uL (ref 3.87–5.11)
WBC: 12.8 10*3/uL — ABNORMAL HIGH (ref 4.0–10.5)

## 2013-01-11 SURGERY — LAPAROSCOPIC CHOLECYSTECTOMY WITH INTRAOPERATIVE CHOLANGIOGRAM
Anesthesia: General | Site: Abdomen | Wound class: Clean Contaminated

## 2013-01-11 MED ORDER — HYDROMORPHONE HCL PF 1 MG/ML IJ SOLN
INTRAMUSCULAR | Status: AC
  Administered 2013-01-11: 1.5 mg via INTRAVENOUS
  Filled 2013-01-11: qty 2

## 2013-01-11 MED ORDER — OXYCODONE HCL 5 MG/5ML PO SOLN: 5.0000 mg | Freq: Once | ORAL | Status: DC | PRN

## 2013-01-11 MED ORDER — 0.9 % SODIUM CHLORIDE (POUR BTL) OPTIME
TOPICAL | Status: DC | PRN
  Administered 2013-01-11: 1000 mL

## 2013-01-11 MED ORDER — LIDOCAINE HCL (CARDIAC) 20 MG/ML IV SOLN
INTRAVENOUS | Status: DC | PRN
  Administered 2013-01-11: 50 mg via INTRAVENOUS

## 2013-01-11 MED ORDER — BUPIVACAINE-EPINEPHRINE 0.25% -1:200000 IJ SOLN
INTRAMUSCULAR | Status: DC | PRN
  Administered 2013-01-11: 10 mL
  Administered 2013-01-11: 30 mL

## 2013-01-11 MED ORDER — IPRATROPIUM BROMIDE 0.02 % IN SOLN: 0.5000 mg | RESPIRATORY_TRACT | Status: DC

## 2013-01-11 MED ORDER — CEFAZOLIN SODIUM-DEXTROSE 2-3 GM-% IV SOLR
INTRAVENOUS | Status: DC | PRN
  Administered 2013-01-11: 2 g via INTRAVENOUS

## 2013-01-11 MED ORDER — OXYCODONE HCL 5 MG PO TABS: 5.0000 mg | ORAL_TABLET | Freq: Once | ORAL | Status: DC | PRN

## 2013-01-11 MED ORDER — ALBUTEROL SULFATE (5 MG/ML) 0.5% IN NEBU: 2.5000 mg | INHALATION_SOLUTION | Freq: Three times a day (TID) | RESPIRATORY_TRACT | Status: DC

## 2013-01-11 MED ORDER — LACTATED RINGERS IV SOLN
INTRAVENOUS | Status: DC
  Administered 2013-01-11: 50 mL/h via INTRAVENOUS

## 2013-01-11 MED ORDER — BUPIVACAINE HCL (PF) 0.25 % IJ SOLN
INTRAMUSCULAR | Status: AC
  Filled 2013-01-11: qty 30

## 2013-01-11 MED ORDER — HYDROMORPHONE HCL PF 1 MG/ML IJ SOLN
INTRAMUSCULAR | Status: AC
  Filled 2013-01-11: qty 1

## 2013-01-11 MED ORDER — PROMETHAZINE HCL 25 MG/ML IJ SOLN: 6.2500 mg | INTRAMUSCULAR | Status: DC | PRN

## 2013-01-11 MED ORDER — MIDAZOLAM HCL 5 MG/5ML IJ SOLN
INTRAMUSCULAR | Status: DC | PRN
  Administered 2013-01-11: 2 mg via INTRAVENOUS

## 2013-01-11 MED ORDER — ROCURONIUM BROMIDE 100 MG/10ML IV SOLN
INTRAVENOUS | Status: DC | PRN
  Administered 2013-01-11: 10 mg via INTRAVENOUS
  Administered 2013-01-11: 40 mg via INTRAVENOUS
  Administered 2013-01-11: 10 mg via INTRAVENOUS
  Administered 2013-01-11 (×2): 20 mg via INTRAVENOUS

## 2013-01-11 MED ORDER — PROPOFOL 10 MG/ML IV BOLUS
INTRAVENOUS | Status: DC | PRN
  Administered 2013-01-11: 130 mg via INTRAVENOUS

## 2013-01-11 MED ORDER — ALBUTEROL SULFATE (5 MG/ML) 0.5% IN NEBU
2.5000 mg | INHALATION_SOLUTION | Freq: Three times a day (TID) | RESPIRATORY_TRACT | Status: DC
  Administered 2013-01-12 – 2013-01-15 (×10): 2.5 mg via RESPIRATORY_TRACT
  Filled 2013-01-11 (×10): qty 0.5

## 2013-01-11 MED ORDER — IPRATROPIUM BROMIDE 0.02 % IN SOLN
0.5000 mg | Freq: Three times a day (TID) | RESPIRATORY_TRACT | Status: DC
  Administered 2013-01-12 – 2013-01-15 (×10): 0.5 mg via RESPIRATORY_TRACT
  Filled 2013-01-11 (×10): qty 2.5

## 2013-01-11 MED ORDER — HYDROMORPHONE HCL PF 1 MG/ML IJ SOLN
0.2500 mg | INTRAMUSCULAR | Status: DC | PRN
  Administered 2013-01-11 (×4): 0.5 mg via INTRAVENOUS

## 2013-01-11 MED ORDER — SODIUM CHLORIDE 0.9 % IV SOLN
INTRAVENOUS | Status: DC | PRN
  Administered 2013-01-11: 10:00:00

## 2013-01-11 MED ORDER — FENTANYL CITRATE 0.05 MG/ML IJ SOLN
INTRAMUSCULAR | Status: DC | PRN
  Administered 2013-01-11 (×5): 50 ug via INTRAVENOUS
  Administered 2013-01-11: 100 ug via INTRAVENOUS
  Administered 2013-01-11 (×2): 50 ug via INTRAVENOUS

## 2013-01-11 MED ORDER — LACTATED RINGERS IV SOLN
INTRAVENOUS | Status: DC | PRN
  Administered 2013-01-11 (×2): via INTRAVENOUS

## 2013-01-11 MED ORDER — ONDANSETRON HCL 4 MG/2ML IJ SOLN
INTRAMUSCULAR | Status: DC | PRN
  Administered 2013-01-11: 4 mg via INTRAVENOUS

## 2013-01-11 MED ORDER — HYDROMORPHONE HCL PF 1 MG/ML IJ SOLN
1.5000 mg | INTRAMUSCULAR | Status: DC | PRN
  Administered 2013-01-11 – 2013-01-13 (×17): 1.5 mg via INTRAVENOUS
  Filled 2013-01-11 (×16): qty 2

## 2013-01-11 MED ORDER — ALBUTEROL SULFATE HFA 108 (90 BASE) MCG/ACT IN AERS
INHALATION_SPRAY | RESPIRATORY_TRACT | Status: DC | PRN
  Administered 2013-01-11 (×2): 2 via RESPIRATORY_TRACT

## 2013-01-11 MED ORDER — SODIUM CHLORIDE 0.9 % IR SOLN
Status: DC | PRN
  Administered 2013-01-11: 1000 mL

## 2013-01-11 MED ORDER — GI COCKTAIL ~~LOC~~
30.0000 mL | Freq: Four times a day (QID) | ORAL | Status: DC | PRN
  Administered 2013-01-11: 30 mL via ORAL
  Filled 2013-01-11: qty 30

## 2013-01-11 SURGICAL SUPPLY — 48 items
ADH SKN CLS APL DERMABOND .7 (GAUZE/BANDAGES/DRESSINGS) ×1
APPLIER CLIP ROT 10 11.4 M/L (STAPLE) ×2
APR CLP MED LRG 11.4X10 (STAPLE) ×1
BAG SPEC RTRVL LRG 6X4 10 (ENDOMECHANICALS) ×1
CANISTER SUCTION 2500CC (MISCELLANEOUS) ×2 IMPLANT
CATH REDDICK CHOLANGI 4FR 50CM (CATHETERS) ×2 IMPLANT
CHLORAPREP W/TINT 26ML (MISCELLANEOUS) ×2 IMPLANT
CLIP APPLIE ROT 10 11.4 M/L (STAPLE) ×1 IMPLANT
CLOTH BEACON ORANGE TIMEOUT ST (SAFETY) ×2 IMPLANT
COVER MAYO STAND STRL (DRAPES) ×2 IMPLANT
COVER SURGICAL LIGHT HANDLE (MISCELLANEOUS) ×2 IMPLANT
DECANTER SPIKE VIAL GLASS SM (MISCELLANEOUS) ×4 IMPLANT
DERMABOND ADVANCED (GAUZE/BANDAGES/DRESSINGS) ×1
DERMABOND ADVANCED .7 DNX12 (GAUZE/BANDAGES/DRESSINGS) ×1 IMPLANT
DRAIN CHANNEL 19F RND (DRAIN) ×1 IMPLANT
DRAPE C-ARM 42X72 X-RAY (DRAPES) ×2 IMPLANT
DRAPE UTILITY 15X26 W/TAPE STR (DRAPE) ×4 IMPLANT
ELECT REM PT RETURN 9FT ADLT (ELECTROSURGICAL) ×2
ELECTRODE REM PT RTRN 9FT ADLT (ELECTROSURGICAL) ×1 IMPLANT
EVACUATOR SILICONE 100CC (DRAIN) ×1 IMPLANT
GLOVE BIO SURGEON STRL SZ7.5 (GLOVE) ×2 IMPLANT
GLOVE BIO SURGEON STRL SZ8 (GLOVE) ×1 IMPLANT
GLOVE BIOGEL PI IND STRL 6.5 (GLOVE) IMPLANT
GLOVE BIOGEL PI IND STRL 8 (GLOVE) IMPLANT
GLOVE BIOGEL PI INDICATOR 6.5 (GLOVE) ×1
GLOVE BIOGEL PI INDICATOR 8 (GLOVE) ×1
GLOVE SURG SS PI 6.5 STRL IVOR (GLOVE) ×1 IMPLANT
GOWN BRE IMP SLV AUR XL STRL (GOWN DISPOSABLE) ×1 IMPLANT
GOWN STRL NON-REIN LRG LVL3 (GOWN DISPOSABLE) ×8 IMPLANT
IV CATH 14GX2 1/4 (CATHETERS) ×2 IMPLANT
KIT BASIN OR (CUSTOM PROCEDURE TRAY) ×2 IMPLANT
KIT ROOM TURNOVER OR (KITS) ×2 IMPLANT
NS IRRIG 1000ML POUR BTL (IV SOLUTION) ×2 IMPLANT
PAD ARMBOARD 7.5X6 YLW CONV (MISCELLANEOUS) ×2 IMPLANT
POUCH SPECIMEN RETRIEVAL 10MM (ENDOMECHANICALS) ×2 IMPLANT
SCISSORS LAP 5X35 DISP (ENDOMECHANICALS) ×1 IMPLANT
SET IRRIG TUBING LAPAROSCOPIC (IRRIGATION / IRRIGATOR) ×2 IMPLANT
SLEEVE ENDOPATH XCEL 5M (ENDOMECHANICALS) ×2 IMPLANT
SPECIMEN JAR SMALL (MISCELLANEOUS) ×2 IMPLANT
SPONGE GAUZE 4X4 12PLY (GAUZE/BANDAGES/DRESSINGS) ×1 IMPLANT
SUT ETHILON 3 0 FSL (SUTURE) ×1 IMPLANT
SUT MNCRL AB 4-0 PS2 18 (SUTURE) ×2 IMPLANT
TOWEL OR 17X24 6PK STRL BLUE (TOWEL DISPOSABLE) ×2 IMPLANT
TOWEL OR 17X26 10 PK STRL BLUE (TOWEL DISPOSABLE) ×2 IMPLANT
TRAY LAPAROSCOPIC (CUSTOM PROCEDURE TRAY) ×2 IMPLANT
TROCAR XCEL BLUNT TIP 100MML (ENDOMECHANICALS) ×2 IMPLANT
TROCAR XCEL NON-BLD 11X100MML (ENDOMECHANICALS) ×2 IMPLANT
TROCAR XCEL NON-BLD 5MMX100MML (ENDOMECHANICALS) ×3 IMPLANT

## 2013-01-11 NOTE — Progress Notes (Signed)
Pt received from OR.  Initially on simple mask at 10 liters O2.  Pt audibly wheezing on expiration.  Albuterol and fent given by CRNA.  Pt placed on NRB mask.

## 2013-01-11 NOTE — Preoperative (Signed)
Beta Blockers   Reason not to administer Beta Blockers:Not Applicable 

## 2013-01-11 NOTE — Progress Notes (Signed)
Called to see patient by Dr. Carolynne Edouard.  Patient with history gallstone pancreatitis, had cholecystectomy today and this showed dilated bile duct with scant flow of contrast into duodenum, and question of distal bile duct debris.  Patient is groggy and recovering in PACU at present.  Most recent LFTs normal.  Assessment:  Abnormal intraoperative cholangiogram.  Plan: 1.  Follow LFTs. 2.  Doubt utility or feasibility of MRCP for this patient. 3.  If LFTs don't significantly elevate, would manage medically and consider outpatient EUS.  If LFTs dramatically increase, consider inpatient EUS with ERCP to follow if EUS shows choledocholithiasis. 4.  Will follow.

## 2013-01-11 NOTE — H&P (View-Only) (Signed)
Subjective: The patient is complaining of a lot of pain this morning, she appears uncomfortable. She is currently NPO And only ice chips. She is passing flatus and urinating through a catheter.  She is not ambulatory at this time.    Objective: Vital signs in last 24 hours: Temp:  [96.6 F (35.9 C)-98.7 F (37.1 C)] 98.2 F (36.8 C) (01/15 0449) Pulse Rate:  [75-93] 75  (01/15 0449) Resp:  [15-28] 28  (01/15 0449) BP: (125-152)/(65-110) 136/74 mmHg (01/15 0449) SpO2:  [92 %-96 %] 94 % (01/15 0449) Last BM Date: 01/09/13  Intake/Output from previous day: 01/14 0701 - 01/15 0700 In: 697.9 [P.O.:420; I.V.:80; IV Piggyback:100] Out: 1500 [Urine:1500] Intake/Output this shift:    PE: Gen:  Alert, NAD, pleasant Abd: Soft, moderately tender in LUQ, ND, +BS, no HSM  Lab Results:   Basename 01/10/13 0500 01/09/13 0818  WBC 13.8* 12.1*  HGB 12.1 12.4  HCT 37.2 38.7  PLT 267 249   BMET  Basename 01/10/13 0500 01/09/13 0818  NA 139 142  K 3.9 3.3*  CL 100 100  CO2 31 32  GLUCOSE 122* 97  BUN 19 20  CREATININE 0.39* 0.46*  CALCIUM 8.8 8.8   PT/INR No results found for this basename: LABPROT:2,INR:2 in the last 72 hours CMP     Component Value Date/Time   NA 139 01/10/2013 0500   K 3.9 01/10/2013 0500   CL 100 01/10/2013 0500   CO2 31 01/10/2013 0500   GLUCOSE 122* 01/10/2013 0500   BUN 19 01/10/2013 0500   CREATININE 0.39* 01/10/2013 0500   CALCIUM 8.8 01/10/2013 0500   PROT 6.2 01/10/2013 0500   ALBUMIN 2.0* 01/10/2013 0500   AST 16 01/10/2013 0500   ALT 33 01/10/2013 0500   ALKPHOS 118* 01/10/2013 0500   BILITOT 0.3 01/10/2013 0500   GFRNONAA >90 01/10/2013 0500   GFRAA >90 01/10/2013 0500   Lipase     Component Value Date/Time   LIPASE 50 01/10/2013 0500       Studies/Results: Dg Chest Port 1 View  01/10/2013  *RADIOLOGY REPORT*  Clinical Data: Follow-up  PORTABLE CHEST - 1 VIEW  Comparison: 01/09/2013  Findings: .  Lung volumes remain low.  To cardiac collapse  / consolidation persists with left pleural effusion.  Vascular congestion again noted.  The radius trace amount of fluid in the minor fissure.  IMPRESSION: Slight improvement in overall aeration with persistent retrocardiac collapse / consolidation with left effusion.   Original Report Authenticated By: Kennith Center, M.D.    Dg Chest Port 1 View  01/09/2013  *RADIOLOGY REPORT*  Clinical Data: Follow up  PORTABLE CHEST - 1 VIEW  Comparison: None.  Findings: Cardiomegaly with mild to moderate interstitial edema, increased.  Worsening opacification of the left mid lung, underlying pneumonia not excluded.  Moderate layering left pleural effusion.  No pneumothorax.  IMPRESSION: Cardiomegaly with mild to moderate interstitial edema, increased.  Moderate layering left pleural effusion.  Underlying left upper lobe pneumonia not excluded.   Original Report Authenticated By: Charline Bills, M.D.    Dg Chest Port 1 View  01/08/2013  *RADIOLOGY REPORT*  Clinical Data: Hypoxia.  PORTABLE CHEST - 1 VIEW  Comparison: CT chest 01/07/2013 and chest radiograph 01/01/2011.  Findings: Trachea is midline.  Heart size is accentuated by AP semi upright technique and low lung volumes.  There is central pulmonary vascular congestion with left perihilar and left lower lobe air space disease.  Mild diffuse interstitial prominence.  Small left  pleural effusion.  IMPRESSION: Bilateral air space disease, left greater than right.  Left upper/left lower lobe pneumonia is queried. Probable underlying edema.   Original Report Authenticated By: Leanna Battles, M.D.     Anti-infectives: Anti-infectives     Start     Dose/Rate Route Frequency Ordered Stop   01/06/13 1100   piperacillin-tazobactam (ZOSYN) IVPB 3.375 g  Status:  Discontinued        3.375 g 12.5 mL/hr over 240 Minutes Intravenous 3 times per day 01/06/13 1045 01/09/13 0930           Assessment/Plan 1. Gallstone pancreatitis:  --Cont current treatment - changed  pain medication because the patient is not getting pain relief from fentanyl, switch to dilaudid -- We are still concerned about the patient respiratory status and therefore we do not want to take the patient to the OR for her colecystectomy until her respiratory status is more stable, given the gallbaldder is not currently the cause of her pain --Labs are essentially normal - do not know why patients pain is out of proportion --Leukocytosis slightly up today --Cont NPO given pain, Will plan for surgery tomorrow unless we get an opening today --Re-consulted hospitalist - Dr. Charlean Sanfilippo team  2. Acute respiratory failure:  cont duonebs and IS  --per medicine service, just transferred from ICU to medsurg  3. Low Phosphate: per medicine service     LOS: 6 days    DORT, Janequa Kipnis 01/10/2013, 9:15 AM Pager: [phone]

## 2013-01-11 NOTE — Transfer of Care (Addendum)
Immediate Anesthesia Transfer of Care Note  Patient: [name] [name]  Procedure(s) Performed: Procedure(s) (LRB) with comments: LAPAROSCOPIC CHOLECYSTECTOMY WITH INTRAOPERATIVE CHOLANGIOGRAM (N/A)  Patient Location: PACU  Anesthesia Type:General  Level of Consciousness: awake, alert  and oriented  Airway & Oxygen Therapy: non-rebreather face mask  Post-op Assessment: Report given to PACU RN  Post vital signs: stable  Complications: No apparent anesthesia complications and pt O2 sats after extubation 88-90%.  Wheezing.  Albuterol administered and 100% NRB applied in PACU.  Will monitor.

## 2013-01-11 NOTE — Interval H&P Note (Signed)
History and Physical Interval Note:  01/11/2013 9:01 AM  [name] [name]  has presented today for surgery, with the diagnosis of Gallstone;pancreatitis  The various methods of treatment have been discussed with the patient and family. After consideration of risks, benefits and other options for treatment, the patient has consented to  Procedure(s) (LRB) with comments: LAPAROSCOPIC CHOLECYSTECTOMY WITH INTRAOPERATIVE CHOLANGIOGRAM (N/A) as a surgical intervention .  The patient's history has been reviewed, patient examined, no change in status, stable for surgery.  I have reviewed the patient's chart and labs.  Questions were answered to the patient's satisfaction.     TOTH III,[name]

## 2013-01-11 NOTE — Progress Notes (Signed)
[facility] PROGRESS NOTE  [name] [name] AVW:[id number] DOB: 1956/06/13 DOA: 01/04/2013 PCP: Kaleen Mask, MD  Assessment/Plan: Acute pancreatitis secondary to gallstones - She continues to have abdominal pain. She is now status post cholecystectomy on 01/11/2013, with plan of potentially removing the residual gallstones from the biliary tree. Patient denies any nausea like to try something to eat after the surgery and asked her diet to clear liquids. We'll continue to monitor and see how she does.   2. Hypoxic and hypercarbic respiratory failure - Improving she is on 3 L nasal cannula satting 99%; we'll continue to monitor and wean off oxygen as tolerated. She continues to have abdominal pain at the surgical site and will titrate off pain medication, keeping in mind that when she had the PCA pump she had to be moved to the intensive care unit for closer monitoring secondary to respiratory failure.  3. Abdominal pain - Likely secondary to acute pancreatitis we'll continue pain management  4. Hyperlipidemia - On admission her total cholesterol was elevated at 225, LDL at 143 - She will likely benefit from starting a lipid-lowering agent on discharge  Code Status: Full Family Communication: patient, sister in law Disposition Plan: TBD  Consultants:  Surgery Gastroenterology  Procedures:  none  Antibiotics:  Piptazo (stopped 1.14.14)  HPI/Subjective: She continues to be in pain this morning however she is happy that surgery will happen soon   Objective: Filed Vitals:   01/11/13 1313 01/11/13 1315 01/11/13 1335 01/11/13 1632  BP:   125/73   Pulse: 77 83 77   Temp:   98.6 F (37 C)   TempSrc:   Oral   Resp: 13  15   Height:      Weight:      SpO2: 93% 93% 94% 98%    Intake/Output Summary (Last 24 hours) at 01/11/13 1738 Last data filed at 01/11/13 1300  Gross per 24 hour  Intake   1584 ml  Output   1470 ml  Net    114 ml   Filed Weights   01/05/13  0001 01/08/13 1214  Weight: 125.646 kg (277 lb) 138.7 kg (305 lb 12.5 oz)    Exam:   General:  In mild distress 2/2 abdominal pain  Cardiovascular: RRR, no MRG, however exam limited due to the body habitus  Respiratory: coarse breath sounds  Abdomen: soft, diffusely tender to palpation mainly in the epigastric area. Post surgical drain in place.  Data Reviewed: Basic Metabolic Panel:  Lab 01/11/13 8295 01/10/13 0500 01/09/13 0818 01/08/13 0730 01/07/13 0620 01/05/13 0455  NA 141 139 142 139 139 --  K 4.4 3.9 3.3* 3.7 4.4 --  CL 102 100 100 100 102 --  CO2 32 31 32 30 28 --  GLUCOSE 104* 122* 97 99 107* --  BUN 18 19 20 18 23  --  CREATININE 0.47* 0.39* 0.46* 0.48* 0.67 --  CALCIUM 9.0 8.8 8.8 8.6 8.4 --  MG -- 2.2 2.5 -- -- 1.9  PHOS -- 1.2* 1.7* -- -- 4.1   Liver Function Tests:  Lab 01/10/13 0500 01/09/13 0818 01/08/13 0730 01/07/13 0620 01/06/13 0555  AST 16 17 22 28  65*  ALT 33 39* 57* 91* 158*  ALKPHOS 118* 150* 168* 110 121*  BILITOT 0.3 0.4 0.6 0.6 0.9  PROT 6.2 6.1 6.0 5.9* 6.0  ALBUMIN 2.0* 2.1* 2.3* 2.5* 2.9*    Lab 01/10/13 0500 01/09/13 0818 01/08/13 0730 01/07/13 0620 01/06/13 0555  LIPASE 50 35 45 123* 1519*  AMYLASE -- -- -- -- --   No results found for this basename: AMMONIA:5 in the last 168 hours CBC:  Lab 01/11/13 0600 01/10/13 0500 01/09/13 0818 01/08/13 0730 01/07/13 0620 01/06/13 0555 01/04/13 1809  WBC 12.8* 13.8* 12.1* 14.0* 17.8* -- --  NEUTROABS -- 10.7* 9.9* -- 15.8* 17.2* 6.6  HGB 12.4 12.1 12.4 12.1 13.0 -- --  HCT 38.0 37.2 38.7 37.8 41.2 -- --  MCV 91.6 91.0 92.1 92.6 93.4 -- --  PLT 281 267 249 227 203 -- --   Cardiac Enzymes: No results found for this basename: CKTOTAL:5,CKMB:5,CKMBINDEX:5,TROPONINI:5 in the last 168 hours BNP (last 3 results) No results found for this basename: PROBNP:3 in the last 8760 hours CBG:  Lab 01/09/13 1208 01/09/13 0846 01/09/13 0438 01/09/13 0004 01/08/13 2004  GLUCAP 128* 111* 97 100* 87     Recent Results (from the past 240 hour(s))  MRSA PCR SCREENING     Status: Normal   Collection Time   01/08/13 12:16 PM      Component Value Range Status Comment   MRSA by PCR NEGATIVE  NEGATIVE Final      Studies: Dg Cholangiogram Operative  01/11/2013  *RADIOLOGY REPORT*  Clinical Data:   Cholecystectomy.  INTRAOPERATIVE CHOLANGIOGRAM  Technique:  Cholangiographic images from the C-arm fluoroscopic device were submitted for interpretation post-operatively.  Please see the procedural report for the amount of contrast and the fluoroscopy time utilized.  Comparison:  Abdominal CT 01/06/2012  Findings:  The intrahepatic and extrahepatic bile ducts are dilated.  No contrast is identified within the duodenum.  There may be small filling defects within the common bile duct.  IMPRESSION: The biliary system is dilated and there is concern for a distal obstruction since no contrast is identified within the duodenum.  Small filling defects within the common bile duct may represent sludge or debris.   Original Report Authenticated By: Richarda Overlie, M.D.    Dg Chest Port 1 View  01/10/2013  *RADIOLOGY REPORT*  Clinical Data: Follow-up  PORTABLE CHEST - 1 VIEW  Comparison: 01/09/2013  Findings: .  Lung volumes remain low.  To cardiac collapse / consolidation persists with left pleural effusion.  Vascular congestion again noted.  The radius trace amount of fluid in the minor fissure.  IMPRESSION: Slight improvement in overall aeration with persistent retrocardiac collapse / consolidation with left effusion.   Original Report Authenticated By: Kennith Center, M.D.     Scheduled Meds:    . ipratropium  0.5 mg Nebulization Q4H   And  . albuterol  2.5 mg Nebulization Q4H  . antiseptic oral rinse  15 mL Mouth Rinse q12n4p  . chlorhexidine  15 mL Mouth Rinse BID  . HYDROmorphone      . pantoprazole (PROTONIX) IV  40 mg Intravenous QHS  . polyethylene glycol  17 g Oral Daily   Continuous Infusions:    .  sodium chloride 75 mL/hr at 01/11/13 1351    Principal Problem:  *Gallstone pancreatitis Active Problems:  Common biliary duct obstruction  Leukocytosis  Acute respiratory failure: hypoxic and hypercarbic  Atelectasis  Pleural effusion  Hypotension   [name] [name]  [facility] Pager [phone]. If 8PM-8AM, please contact night-coverage at [email or web address], password [facility] 01/11/2013, 5:38 PM  LOS: 7 days

## 2013-01-11 NOTE — Anesthesia Preprocedure Evaluation (Addendum)
Anesthesia Evaluation  Patient identified by MRN, date of birth, ID band Patient awake    Reviewed: Allergy & Precautions, H&P , NPO status , Patient's Chart, lab work & pertinent test results, reviewed documented beta blocker date and time   History of Anesthesia Complications Negative for: history of anesthetic complications  Airway Mallampati: I TM Distance: >3 FB Neck ROM: Full    Dental  (+) Edentulous Upper and Dental Advisory Given   Pulmonary shortness of breath and at rest, asthma , pneumonia -, resolved,  breath sounds clear to auscultation  Pulmonary exam normal       Cardiovascular negative cardio ROS  Rhythm:Regular     Neuro/Psych  Headaches,  Neuromuscular disease    GI/Hepatic negative GI ROS, Neg liver ROS,   Endo/Other  Morbid obesity  Renal/GU negative Renal ROS     Musculoskeletal   Abdominal (+)  Abdomen: soft. Bowel sounds: normal.  Peds  Hematology   Anesthesia Other Findings   Reproductive/Obstetrics                       Anesthesia Physical Anesthesia Plan  ASA: III  Anesthesia Plan: General   Post-op Pain Management:    Induction: Intravenous and Rapid sequence  Airway Management Planned: Oral ETT  Additional Equipment:   Intra-op Plan:   Post-operative Plan: Extubation in OR  Informed Consent: I have reviewed the patients History and Physical, chart, labs and discussed the procedure including the risks, benefits and alternatives for the proposed anesthesia with the patient or authorized representative who has indicated his/her understanding and acceptance.   Dental advisory given  Plan Discussed with: CRNA, Anesthesiologist and Surgeon  Anesthesia Plan Comments:        Anesthesia Quick Evaluation

## 2013-01-11 NOTE — Op Note (Signed)
01/04/2013 - 01/11/2013  11:10 AM  PATIENT:  [name] [name]  57 y.o. female  PRE-OPERATIVE DIAGNOSIS:  Gallstones; pancreatitis  POST-OPERATIVE DIAGNOSIS:  Gallstones; pancreatitis  PROCEDURE:  Procedure(s) (LRB) with comments: LAPAROSCOPIC CHOLECYSTECTOMY WITH INTRAOPERATIVE CHOLANGIOGRAM (N/A)  SURGEON:  Surgeon(s) and Role:    * Robyne Askew, MD - Primary    * Liz Malady, MD - Assisting  PHYSICIAN ASSISTANT:   ASSISTANTS: Dr. Janee Morn   ANESTHESIA:   general  EBL:  Total I/O In: 1000 [I.V.:1000] Out: -   BLOOD ADMINISTERED:none  DRAINS: (1) Jackson-Pratt drain(s) with closed bulb suction in the liver bed   LOCAL MEDICATIONS USED:  MARCAINE     SPECIMEN:  Source of Specimen:  gallbladder  DISPOSITION OF SPECIMEN:  PATHOLOGY  COUNTS:  YES  TOURNIQUET:  * No tourniquets in log *  DICTATION: .Dragon Dictation After informed consent was obtained the patient was brought to the operating room and placed in the supine position on the operating room table. After adequate induction of general anesthesia the patient's abdomen was prepped with ChloraPrep, dry, and draped in usual sterile manner. 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 A site was chosen in the right upper quadrant and this area was infiltrated with quarter percent Marcaine. A small stab incision was made with a 15 blade knife. This a 5 mm Optiview was used to bluntly dissect through the layers of the abdominal wall until the abdominal cavity was accessed. The abdomen was then insufflated with carbon dioxide without difficulty. The laparoscope was placed back through the 5 mm port. Unfortunately on entering the abdominal cavity the Optiview trocar made a small hole in the liver. Another 5 mm port was placed in the supraumbilical location under direct vision without difficulty. The camera was then moved to this  location and using a cautery spatula we were able to control the bleeding. A second 5 mm port was also placed under direct vision the right upper quadrant. The epigastric area was then infiltrated with quarter percent Marcaine and in the small incision was made with a 15 blade knife. Addendum report was placed bluntly through this incision into the abdominal cavity under direct vision. The gallbladder was identified and was very distended and difficult to grab. Because of this we used a Nezhat aspiration device to aspirate the bile from the gallbladder so that we could grasp it. We then placed a blunt grasper through the lateralmost, were port and used to grasp the dome of the gallbladder and elevated anteriorly and superiorly. Another blood pressure is placed in the other 5 mm port and used to retract on the body and neck of the gallbladder. The blunt dissector was then placed in the epigastric port and using the electrocautery the peritoneal reflection of the gallbladder neck was opened. Blunt dissection was carried out in this area to the gallbladder neck cystic duct junction was readily identified and a good window was created. A clip was placed on the gallbladder neck. A small ductotomy was made just below the clip with the laparoscopic scissors. A 14-gauge Angiocath was placed percutaneously through the anterior abdominal wall under direct vision. A Reddick cholangiogram catheter was placed through the Angiocath and flushed. The Reddick catheter was then placed within the cystic duct back in place with a clip. A cholangiogram was obtained it did show some stones floating in a very dilated  common bile duct. There was no obvious emptying into the duodenum. We were unable to feed the Reddick catheter down to the common duct. At this point we removed the catheters from the patient along with the anchoring clip. The cystic duct stump was controlled with 4 clips. Posterior to this the cystic artery was identified  and again dissected bluntly in a circumferential manner until a good window was created. 2 clips placed proximally on the artery and one distally and the artery was divided between the 2 clips. Next a laparoscopic hook cautery device was used to separate the gallbladder from the liver bed. Once the gallbladder was detached from the liver bed the liver bed was inspected and several small bleeding points were coagulated with the electrocautery until the area was completely hemostatic. A laparoscopic bag was inserted through the epigastric port. The gallbladder was placed within the bag and the bag was sealed. The abdomen was then irrigated with copious amounts of saline until the effluent was clear. A blunt grasper was then placed through the lateralmost 5 mm port and up through the epigastric port. This was used to bring a 54 [place] round Blake drain into the abdominal cavity. The drain was placed in the liver bed where the gallbladder used to be. The drain was anchored to the skin with a 3-0 nylon stitch. The liver was inspected again and was completely hemostatic including the area of trocar injury on the dome of the gallbladder. The gallbladder and bag were then removed through the epigastric port without difficulty. The rest of the ports were removed under direct vision the gas was allowed to escape. The skin incisions were all closed with interrupted 4 Monocryl subcuticular stitches. Dermabond and sterile dressings were applied. The patient tolerated the procedure well. At the end of the case all needle sponge and instrument counts were correct. The patient was then awakened and taken to recovery in stable condition.  PLAN OF CARE: Admit to inpatient   PATIENT DISPOSITION:  PACU - hemodynamically stable.   Delay start of Pharmacological VTE agent (>24hrs) due to surgical blood loss or risk of bleeding: yes

## 2013-01-11 NOTE — Anesthesia Postprocedure Evaluation (Signed)
Anesthesia Post Note  Patient: [name] [name]  Procedure(s) Performed: Procedure(s) (LRB): LAPAROSCOPIC CHOLECYSTECTOMY WITH INTRAOPERATIVE CHOLANGIOGRAM (N/A)  Anesthesia type: general  Patient location: PACU  Post pain: Pain level controlled  Post assessment: Patient's Cardiovascular Status Stable  Last Vitals:  Filed Vitals:   01/11/13 1230  BP: 130/69  Pulse: 74  Temp:   Resp: 14    Post vital signs: Reviewed and stable  Level of consciousness: sedated  Complications: No apparent anesthesia complications

## 2013-01-11 NOTE — OR Nursing (Signed)
Foley Catheter in place prior to surgery.

## 2013-01-11 NOTE — Anesthesia Procedure Notes (Signed)
Procedure Name: Intubation Date/Time: 01/11/2013 9:19 AM Performed by: Ellin Goodie Pre-anesthesia Checklist: Patient identified, Emergency Drugs available, Suction available, Patient being monitored and Timeout performed Patient Re-evaluated:Patient Re-evaluated prior to inductionOxygen Delivery Method: Circle system utilized Preoxygenation: Pre-oxygenation with 100% oxygen Intubation Type: IV induction Ventilation: Mask ventilation without difficulty Laryngoscope Size: Mac and 3 Grade View: Grade I Tube type: Oral Tube size: 7.5 mm Number of attempts: 1 Airway Equipment and Method: Stylet Placement Confirmation: ETT inserted through vocal cords under direct vision,  positive ETCO2 and breath sounds checked- equal and bilateral Secured at: 23 cm Tube secured with: Tape Dental Injury: Teeth and Oropharynx as per pre-operative assessment

## 2013-01-12 ENCOUNTER — Encounter (HOSPITAL_COMMUNITY): Payer: Self-pay | Admitting: General Surgery

## 2013-01-12 LAB — HEPATIC FUNCTION PANEL
ALT: 48 U/L — ABNORMAL HIGH (ref 0–35)
AST: 38 U/L — ABNORMAL HIGH (ref 0–37)
Alkaline Phosphatase: 241 U/L — ABNORMAL HIGH (ref 39–117)
Total Protein: 5.8 g/dL — ABNORMAL LOW (ref 6.0–8.3)

## 2013-01-12 NOTE — Progress Notes (Signed)
Minimal change in LFTs post-operatively.  Would continue supportive management for now.  Would not proceed with ERCP at the present time.  Will follow.

## 2013-01-12 NOTE — Progress Notes (Signed)
PT Cancellation Note  Patient Details Name: [name] [name] MRN: [id number] DOB: 08/24/1956   Cancelled Treatment:    Reason Eval/Treat Not Completed: Other (comment) (Pt just getting back in bed after being up several hours). Will try again tomorrow AM.   Skip Mayer 01/12/2013, 2:23 PM

## 2013-01-12 NOTE — Progress Notes (Signed)
[facility] PROGRESS NOTE  [name] [name] ZOX:[id number] DOB: 03-31-1956 DOA: 01/04/2013 PCP: Kaleen Mask, MD  Assessment/Plan: Acute pancreatitis secondary to gallstones - She continues to have abdominal pain. She is now status post cholecystectomy on 01/11/2013, with plan of potentially removing the residual gallstones from the biliary tree.  - she did well yesterday with po clears - no change in LFTs which suggests that stones are not obstructive, so no ERCP planned - will advance diet today and ask PT to evaluate patient.    2. Hypoxic and hypercarbic respiratory failure - Improving she is on 3 L nasal cannula satting 99%; we'll continue to monitor and wean off oxygen as tolerated.  - encouraged incentive spirometry  3. Abdominal pain - Likely secondary to acute pancreatitis we'll continue pain management  4. Hyperlipidemia - On admission her total cholesterol was elevated at 225, LDL at 143 - She will likely benefit from starting a lipid-lowering agent on discharge  Code Status: Full Family Communication: patient, sister in law Disposition Plan: TBD  Consultants:  Surgery Gastroenterology  Procedures:  none  Antibiotics:  Piptazo (stopped 1.14.14)  HPI/Subjective: Feels better this morning with the pain medication increase  Objective: Filed Vitals:   01/12/13 0142 01/12/13 0433 01/12/13 0853 01/12/13 0953  BP: 118/75 141/79  112/88  Pulse: 65 93  78  Temp: 99 F (37.2 C) 99.6 F (37.6 C)  98.1 F (36.7 C)  TempSrc: Oral Oral  Oral  Resp: 18 18  20   Height:      Weight:      SpO2: 95% 92% 95% 96%    Intake/Output Summary (Last 24 hours) at 01/12/13 1400 Last data filed at 01/12/13 1314  Gross per 24 hour  Intake  985.5 ml  Output   1010 ml  Net  -24.5 ml   Filed Weights   01/05/13 0001 01/08/13 1214  Weight: 125.646 kg (277 lb) 138.7 kg (305 lb 12.5 oz)    Exam:  General:  NAD  Cardiovascular: RRR, no MRG, however exam limited  due to the body habitus  Respiratory: coarse breath sounds, mostly at the bases  Abdomen: soft, diffusely tender to palpation mainly in the epigastric area. Post surgical drain in place.  Data Reviewed: Basic Metabolic Panel:  Lab 01/11/13 8119 01/10/13 0500 01/09/13 0818 01/08/13 0730 01/07/13 0620  NA 141 139 142 139 139  K 4.4 3.9 3.3* 3.7 4.4  CL 102 100 100 100 102  CO2 32 31 32 30 28  GLUCOSE 104* 122* 97 99 107*  BUN 18 19 20 18 23   CREATININE 0.47* 0.39* 0.46* 0.48* 0.67  CALCIUM 9.0 8.8 8.8 8.6 8.4  MG -- 2.2 2.5 -- --  PHOS -- 1.2* 1.7* -- --   Liver Function Tests:  Lab 01/12/13 0507 01/10/13 0500 01/09/13 0818 01/08/13 0730 01/07/13 0620  AST 38* 16 17 22 28   ALT 48* 33 39* 57* 91*  ALKPHOS 241* 118* 150* 168* 110  BILITOT 0.3 0.3 0.4 0.6 0.6  PROT 5.8* 6.2 6.1 6.0 5.9*  ALBUMIN 1.9* 2.0* 2.1* 2.3* 2.5*    Lab 01/10/13 0500 01/09/13 0818 01/08/13 0730 01/07/13 0620 01/06/13 0555  LIPASE 50 35 45 123* 1519*  AMYLASE -- -- -- -- --   No results found for this basename: AMMONIA:5 in the last 168 hours CBC:  Lab 01/11/13 0600 01/10/13 0500 01/09/13 0818 01/08/13 0730 01/07/13 0620 01/06/13 0555  WBC 12.8* 13.8* 12.1* 14.0* 17.8* --  NEUTROABS -- 10.7* 9.9* --  15.8* 17.2*  HGB 12.4 12.1 12.4 12.1 13.0 --  HCT 38.0 37.2 38.7 37.8 41.2 --  MCV 91.6 91.0 92.1 92.6 93.4 --  PLT 281 267 249 227 203 --   Cardiac Enzymes: No results found for this basename: CKTOTAL:5,CKMB:5,CKMBINDEX:5,TROPONINI:5 in the last 168 hours BNP (last 3 results) No results found for this basename: PROBNP:3 in the last 8760 hours CBG:  Lab 01/09/13 1208 01/09/13 0846 01/09/13 0438 01/09/13 0004 01/08/13 2004  GLUCAP 128* 111* 97 100* 87    Recent Results (from the past 240 hour(s))  MRSA PCR SCREENING     Status: Normal   Collection Time   01/08/13 12:16 PM      Component Value Range Status Comment   MRSA by PCR NEGATIVE  NEGATIVE Final      Studies: Dg Cholangiogram  Operative  01/11/2013  *RADIOLOGY REPORT*  Clinical Data:   Cholecystectomy.  INTRAOPERATIVE CHOLANGIOGRAM  Technique:  Cholangiographic images from the C-arm fluoroscopic device were submitted for interpretation post-operatively.  Please see the procedural report for the amount of contrast and the fluoroscopy time utilized.  Comparison:  Abdominal CT 01/06/2012  Findings:  The intrahepatic and extrahepatic bile ducts are dilated.  No contrast is identified within the duodenum.  There may be small filling defects within the common bile duct.  IMPRESSION: The biliary system is dilated and there is concern for a distal obstruction since no contrast is identified within the duodenum.  Small filling defects within the common bile duct may represent sludge or debris.   Original Report Authenticated By: Richarda Overlie, M.D.     Scheduled Meds:    . ipratropium  0.5 mg Nebulization TID   And  . albuterol  2.5 mg Nebulization TID  . antiseptic oral rinse  15 mL Mouth Rinse q12n4p  . chlorhexidine  15 mL Mouth Rinse BID  . pantoprazole (PROTONIX) IV  40 mg Intravenous QHS  . polyethylene glycol  17 g Oral Daily   Continuous Infusions:    . sodium chloride 75 mL/hr at 01/12/13 0155    Principal Problem:  *Gallstone pancreatitis Active Problems:  Common biliary duct obstruction  Leukocytosis  Acute respiratory failure: hypoxic and hypercarbic  Atelectasis  Pleural effusion  Hypotension   [name]  [facility] Pager [phone]. If 7PM-7AM, please contact night-coverage at [email or web address], password [facility] 01/12/2013, 2:00 PM  LOS: 8 days

## 2013-01-12 NOTE — Progress Notes (Signed)
Patient ID: [name] [name], female   DOB: [birth year], 57 y.o.   MRN: [id number] 1 Day Post-Op  Subjective: Pt feels ok.  Sore and having some RUQ tenderness  Objective: Vital signs in last 24 hours: Temp:  [98.5 F (36.9 C)-99.6 F (37.6 C)] 99.6 F (37.6 C) (01/17 0433) Pulse Rate:  [65-95] 93  (01/17 0433) Resp:  [13-21] 18  (01/17 0433) BP: (118-141)/(62-91) 141/79 mmHg (01/17 0433) SpO2:  [90 %-98 %] 92 % (01/17 0433) FiO2 (%):  [100 %] 100 % (01/16 1120) Last BM Date: 01/10/13  Intake/Output from previous day: 01/16 0701 - 01/17 0700 In: 1985.5 [I.V.:1985.5] Out: 1370 [Urine:1150; Drains:220] Intake/Output this shift:    PE: Abd: soft, appropriately tender, obese, incisions c/d/i  Lab Results:   Basename 01/11/13 0600 01/10/13 0500  WBC 12.8* 13.8*  HGB 12.4 12.1  HCT 38.0 37.2  PLT 281 267   BMET  Basename 01/11/13 0600 01/10/13 0500  NA 141 139  K 4.4 3.9  CL 102 100  CO2 32 31  GLUCOSE 104* 122*  BUN 18 19  CREATININE 0.47* 0.39*  CALCIUM 9.0 8.8   PT/INR No results found for this basename: LABPROT:2,INR:2 in the last 72 hours CMP     Component Value Date/Time   NA 141 01/11/2013 0600   K 4.4 01/11/2013 0600   CL 102 01/11/2013 0600   CO2 32 01/11/2013 0600   GLUCOSE 104* 01/11/2013 0600   BUN 18 01/11/2013 0600   CREATININE 0.47* 01/11/2013 0600   CALCIUM 9.0 01/11/2013 0600   PROT 5.8* 01/12/2013 0507   ALBUMIN 1.9* 01/12/2013 0507   AST 38* 01/12/2013 0507   ALT 48* 01/12/2013 0507   ALKPHOS 241* 01/12/2013 0507   BILITOT 0.3 01/12/2013 0507   GFRNONAA >90 01/11/2013 0600   GFRAA >90 01/11/2013 0600   Lipase     Component Value Date/Time   LIPASE 50 01/10/2013 0500       Studies/Results: Dg Cholangiogram Operative  01/11/2013  *RADIOLOGY REPORT*  Clinical Data:   Cholecystectomy.  INTRAOPERATIVE CHOLANGIOGRAM  Technique:  Cholangiographic images from the C-arm fluoroscopic device were submitted for interpretation post-operatively.  Please see  the procedural report for the amount of contrast and the fluoroscopy time utilized.  Comparison:  Abdominal CT 01/06/2012  Findings:  The intrahepatic and extrahepatic bile ducts are dilated.  No contrast is identified within the duodenum.  There may be small filling defects within the common bile duct.  IMPRESSION: The biliary system is dilated and there is concern for a distal obstruction since no contrast is identified within the duodenum.  Small filling defects within the common bile duct may represent sludge or debris.   Original Report Authenticated By: Richarda Overlie, M.D.     Anti-infectives: Anti-infectives     Start     Dose/Rate Route Frequency Ordered Stop   01/06/13 1100   piperacillin-tazobactam (ZOSYN) IVPB 3.375 g  Status:  Discontinued        3.375 g 12.5 mL/hr over 240 Minutes Intravenous 3 times per day 01/06/13 1045 01/09/13 0930           Assessment/Plan  1. S/p lap chole 2. Choledocholithiasis  Plan: 1. GI aware of the patient.  She has CBD stones and will need an ERCP.  Diet can be advanced after that procedure from our standpoint.   LOS: 8 days    Farra Nikolic E 01/12/2013, 8:39 AM Pager: [phone]

## 2013-01-13 LAB — CBC
MCH: 29.7 pg (ref 26.0–34.0)
MCHC: 33.1 g/dL (ref 30.0–36.0)
Platelets: 263 10*3/uL (ref 150–400)
RDW: 14.1 % (ref 11.5–15.5)

## 2013-01-13 LAB — BASIC METABOLIC PANEL
Calcium: 8.8 mg/dL (ref 8.4–10.5)
GFR calc Af Amer: >90 mL/min (ref >90)
GFR calc non Af Amer: >90 mL/min (ref >90)
Potassium: 3.9 mEq/L (ref 3.5–5.1)
Sodium: 138 mEq/L (ref 135–145)

## 2013-01-13 LAB — HEPATIC FUNCTION PANEL
ALT: 36 U/L — ABNORMAL HIGH (ref 0–35)
AST: 25 U/L (ref 0–37)
Bilirubin, Direct: 0.1 mg/dL (ref 0.0–0.3)
Indirect Bilirubin: 0.2 mg/dL — ABNORMAL LOW (ref 0.3–0.9)
Total Bilirubin: 0.3 mg/dL (ref 0.3–1.2)

## 2013-01-13 MED ORDER — OXYCODONE HCL 5 MG PO TABS: 5.0000 mg | ORAL_TABLET | ORAL | Status: DC | PRN

## 2013-01-13 MED ORDER — OXYCODONE HCL 5 MG PO TABS
5.0000 mg | ORAL_TABLET | ORAL | Status: DC | PRN
  Administered 2013-01-13 – 2013-01-17 (×16): 10 mg via ORAL
  Administered 2013-01-17: 5 mg via ORAL
  Administered 2013-01-17 – 2013-01-20 (×10): 10 mg via ORAL
  Filled 2013-01-13 (×13): qty 2
  Filled 2013-01-13: qty 1
  Filled 2013-01-13 (×13): qty 2

## 2013-01-13 MED ORDER — HYDROMORPHONE HCL PF 1 MG/ML IJ SOLN
0.5000 mg | INTRAMUSCULAR | Status: DC | PRN
  Administered 2013-01-13 – 2013-01-14 (×2): 1.5 mg via INTRAVENOUS
  Filled 2013-01-13 (×2): qty 2

## 2013-01-13 NOTE — Evaluation (Signed)
Physical Therapy Evaluation Patient Details Name: [name] [name] MRN: [id number] DOB: 06-04-1956 Today's Date: 01/13/2013 Time: 2725-3664 PT Time Calculation (min): 18 min  PT Assessment / Plan / Recommendation Clinical Impression  Pt admitted with abdominal pain s/p cholecystectomy. Pt is at her baseline functional level, no further acute PT needs. will not follow    PT Assessment  Patent does not need any further PT services    Follow Up Recommendations  No PT follow up;Supervision - Intermittent    Does the patient have the potential to tolerate intense rehabilitation      Barriers to Discharge        Equipment Recommendations  None recommended by PT    Recommendations for Other Services     Frequency      Precautions / Restrictions Precautions Precautions: None Restrictions Weight Bearing Restrictions: No   Pertinent Vitals/Pain No complaints of pain this session.       Mobility  Bed Mobility Bed Mobility: Supine to Sit;Sit to Supine Supine to Sit: 7: Independent Sit to Supine: 7: Independent Transfers Transfers: Sit to Stand;Stand to Sit Sit to Stand: 6: Modified independent (Device/Increase time) Stand to Sit: 6: Modified independent (Device/Increase time) Ambulation/Gait Ambulation/Gait Assistance: 6: Modified independent (Device/Increase time) Ambulation Distance (Feet): 200 Feet Assistive device: Rolling walker Ambulation/Gait Assistance Details: WFL Gait Pattern: Within Functional Limits Gait velocity: slow Stairs: No    Shoulder Instructions     Exercises     PT Diagnosis:    PT Problem List:   PT Treatment Interventions:     PT Goals Acute Rehab PT Goals PT Goal Formulation: With patient  Visit Information  Last PT Received On: 01/13/13 Assistance Needed: +1    Subjective Data  Patient Stated Goal: to go home   Prior Functioning  Home Living Lives With: Alone Available Help at Discharge: Family;Available PRN/intermittently Type  of Home: House Home Access: Ramped entrance Home Layout: One level Bathroom Shower/Tub: Tub/shower unit;Curtain Bathroom Toilet: Standard Bathroom Accessibility: Yes How Accessible: Accessible via walker Home Adaptive Equipment: Straight cane;Walker - rolling;Grab bars around toilet Prior Function Level of Independence: Independent Able to Take Stairs?: Yes Driving: Yes Vocation: On disability Communication Communication: No difficulties Dominant Hand: Right    Cognition  Overall Cognitive Status: Appears within functional limits for tasks assessed/performed Arousal/Alertness: Awake/alert Orientation Level: Appears intact for tasks assessed Behavior During Session: [facility] for tasks performed    Extremity/Trunk Assessment Right Lower Extremity Assessment RLE ROM/Strength/Tone: Within functional levels RLE Sensation: WFL - Light Touch Left Lower Extremity Assessment LLE ROM/Strength/Tone: Within functional levels LLE Sensation: WFL - Light Touch   Balance    End of Session PT - End of Session Equipment Utilized During Treatment: Gait belt Activity Tolerance: Patient tolerated treatment well Patient left: in bed;with call bell/phone within reach;with family/visitor present Nurse Communication: Mobility status  GP     Milana Kidney 01/13/2013, 4:40 PM  01/13/2013 Milana Kidney DPT PAGER: [phone] OFFICE: [phone]

## 2013-01-13 NOTE — Progress Notes (Signed)
[facility] PROGRESS NOTE  [name] [name] NUU:[id number] DOB: [birth year] DOA: 01/04/2013 PCP: Kaleen Mask, MD  Assessment/Plan: Acute pancreatitis secondary to gallstones - Her abdominal pain since that at this morning. She is now status post cholecystectomy on 01/11/2013, with plan of potentially removing the residual gallstones from the biliary tree.  - She continues to eat well and tolerate food - no change in LFTs which suggests that stones are not obstructive, so no ERCP planned. Liver function tests today are stable. - Would appreciate physical therapy evaluation    2. Hypoxic and hypercarbic respiratory failure - Improving she is on 3 L nasal cannula satting 99%; we'll continue to monitor and wean off oxygen as tolerated.  - encouraged incentive spirometry  3. Abdominal pain - Likely secondary to acute pancreatitis we'll continue pain management. Her pain is getting better and we'll try to transition her to by mouth medications  4. Hyperlipidemia - On admission her total cholesterol was elevated at 225, LDL at 143 - She will likely benefit from starting a lipid-lowering agent on discharge  Code Status: Full Family Communication: patient, sister in law Disposition Plan: TBD  Consultants:  Surgery Gastroenterology  Procedures:  none  Antibiotics:  Piptazo (stopped 1.14.14)  HPI/Subjective: In no acute distress, she seems to have less abdominal tenderness  Objective: Filed Vitals:   01/13/13 0626 01/13/13 0852 01/13/13 1400 01/13/13 1441  BP: 141/78  133/73   Pulse: 71  72   Temp: 97.9 F (36.6 C)  98.2 F (36.8 C)   TempSrc: Oral  Oral   Resp: 19  20   Height:      Weight:      SpO2: 98% 98% 95% 93%    Intake/Output Summary (Last 24 hours) at 01/13/13 1554 Last data filed at 01/13/13 1300  Gross per 24 hour  Intake 2448.75 ml  Output   2890 ml  Net -441.25 ml   Filed Weights   01/05/13 0001 01/08/13 1214  Weight: 125.646 kg (277 lb)  138.7 kg (305 lb 12.5 oz)    Exam:  General:  NAD  Cardiovascular: RRR, no MRG, however exam limited due to the body habitus  Respiratory: coarse breath sounds, mostly at the bases  Abdomen: soft, diffusely tender to palpation mainly in the epigastric area. She's not as tender as yesterday. Post surgical drain in place.  Data Reviewed: Basic Metabolic Panel:  Lab 01/13/13 3474 01/11/13 0600 01/10/13 0500 01/09/13 0818 01/08/13 0730  NA 138 141 139 142 139  K 3.9 4.4 3.9 3.3* 3.7  CL 98 102 100 100 100  CO2 31 32 31 32 30  GLUCOSE 106* 104* 122* 97 99  BUN 10 18 19 20 18   CREATININE 0.45* 0.47* 0.39* 0.46* 0.48*  CALCIUM 8.8 9.0 8.8 8.8 8.6  MG -- -- 2.2 2.5 --  PHOS -- -- 1.2* 1.7* --   Liver Function Tests:  Lab 01/13/13 0910 01/12/13 0507 01/10/13 0500 01/09/13 0818 01/08/13 0730  AST 25 38* 16 17 22   ALT 36* 48* 33 39* 57*  ALKPHOS 202* 241* 118* 150* 168*  BILITOT 0.3 0.3 0.3 0.4 0.6  PROT 5.8* 5.8* 6.2 6.1 6.0  ALBUMIN 1.9* 1.9* 2.0* 2.1* 2.3*    Lab 01/10/13 0500 01/09/13 0818 01/08/13 0730 01/07/13 0620  LIPASE 50 35 45 123*  AMYLASE -- -- -- --   No results found for this basename: AMMONIA:5 in the last 168 hours CBC:  Lab 01/13/13 0910 01/11/13 0600 01/10/13 0500 01/09/13 0818  01/08/13 0730 01/07/13 0620  WBC 13.4* 12.8* 13.8* 12.1* 14.0* --  NEUTROABS -- -- 10.7* 9.9* -- 15.8*  HGB 12.4 12.4 12.1 12.4 12.1 --  HCT 37.5 38.0 37.2 38.7 37.8 --  MCV 89.9 91.6 91.0 92.1 92.6 --  PLT 263 281 267 249 227 --   CBG:  Lab 01/09/13 1208 01/09/13 0846 01/09/13 0438 01/09/13 0004 01/08/13 2004  GLUCAP 128* 111* 97 100* 87    Recent Results (from the past 240 hour(s))  MRSA PCR SCREENING     Status: Normal   Collection Time   01/08/13 12:16 PM      Component Value Range Status Comment   MRSA by PCR NEGATIVE  NEGATIVE Final      Scheduled Meds:    . ipratropium  0.5 mg Nebulization TID   And  . albuterol  2.5 mg Nebulization TID  . pantoprazole  (PROTONIX) IV  40 mg Intravenous QHS  . polyethylene glycol  17 g Oral Daily   Continuous Infusions:    . sodium chloride 10 mL/hr (01/13/13 1039)    Principal Problem:  *Gallstone pancreatitis Active Problems:  Common biliary duct obstruction  Leukocytosis  Acute respiratory failure: hypoxic and hypercarbic  Atelectasis  Pleural effusion  Hypotension   [name]  [facility] Pager [phone]. If 7PM-7AM, please contact night-coverage at [email or web address], password [facility] 01/13/2013, 3:54 PM  LOS: 9 days

## 2013-01-13 NOTE — Progress Notes (Signed)
2 Days Post-Op   Assessment: s/p Procedure(s): LAPAROSCOPIC CHOLECYSTECTOMY WITH INTRAOPERATIVE CHOLANGIOGRAM Patient Active Problem List  Diagnosis  . OBESITY  . ALLERGIC RHINITIS  . DEGENERATIVE DISC DISEASE  . HEADACHE, CHRONIC  . DYSPNEA  . COUGH, CHRONIC  . HYSTERECTOMY, HX OF  . Gallstone pancreatitis  . Common biliary duct obstruction  . Leukocytosis  . Acute respiratory failure: hypoxic and hypercarbic  . Atelectasis  . Pleural effusion  . Hypotension    Stable status post laparoscopic cholecystectomy Gallstone pancreatitis, clinically seems a little better but lab for studies from today still pending  Plan: no changes from our standpoint. We will leave the drain in. Await liver function tests.  Subjective: Feels a little bit better today. Somewhat less pain. Tolerating clear liquids.  Objective: Vital signs in last 24 hours: Temp:  [97.9 F (36.6 C)-99 F (37.2 C)] 97.9 F (36.6 C) (01/18 0626) Pulse Rate:  [71-84] 71  (01/18 0626) Resp:  [19-20] 19  (01/18 0626) BP: (94-141)/(76-88) 141/78 mmHg (01/18 0626) SpO2:  [95 %-98 %] 98 % (01/18 0626)   Intake/Output from previous day: 01/17 0701 - 01/18 0700 In: 2388.8 [P.O.:480; I.V.:1908.8] Out: 1900 [Urine:1650; Drains:250] Intake/Output this shift:     General appearance: alert, cooperative and no distress Resp: clear to auscultation bilaterally Abdomen: Soft, mildly tender consistent with surgery. Incision: all healing nicely. No evidence of any problems.  Lab Results:   Basename 01/11/13 0600  WBC 12.8*  HGB 12.4  HCT 38.0  PLT 281   BMET  Basename 01/11/13 0600  NA 141  K 4.4  CL 102  CO2 32  GLUCOSE 104*  BUN 18  CREATININE 0.47*  CALCIUM 9.0   PT/INR No results found for this basename: LABPROT:2,INR:2 in the last 72 hours ABG No results found for this basename: PHART:2,PCO2:2,PO2:2,HCO3:2 in the last 72 hours  MEDS, Scheduled    . ipratropium  0.5 mg Nebulization TID   And  . albuterol  2.5 mg Nebulization TID  . antiseptic oral rinse  15 mL Mouth Rinse q12n4p  . chlorhexidine  15 mL Mouth Rinse BID  . pantoprazole (PROTONIX) IV  40 mg Intravenous QHS  . polyethylene glycol  17 g Oral Daily    Studies/Results: Dg Cholangiogram Operative  01/11/2013  *RADIOLOGY REPORT*  Clinical Data:   Cholecystectomy.  INTRAOPERATIVE CHOLANGIOGRAM  Technique:  Cholangiographic images from the C-arm fluoroscopic device were submitted for interpretation post-operatively.  Please see the procedural report for the amount of contrast and the fluoroscopy time utilized.  Comparison:  Abdominal CT 01/06/2012  Findings:  The intrahepatic and extrahepatic bile ducts are dilated.  No contrast is identified within the duodenum.  There may be small filling defects within the common bile duct.  IMPRESSION: The biliary system is dilated and there is concern for a distal obstruction since no contrast is identified within the duodenum.  Small filling defects within the common bile duct may represent sludge or debris.   Original Report Authenticated By: Richarda Overlie, M.D.       LOS: 9 days     Currie Paris, MD, [facility] Surgery, [place] [phone]   01/13/2013 8:33 AM

## 2013-01-14 ENCOUNTER — Encounter (HOSPITAL_COMMUNITY): Payer: Self-pay | Admitting: *Deleted

## 2013-01-14 LAB — COMPREHENSIVE METABOLIC PANEL
ALT: 30 U/L (ref 0–35)
AST: 24 U/L (ref 0–37)
Albumin: 1.9 g/dL — ABNORMAL LOW (ref 3.5–5.2)
Alkaline Phosphatase: 156 U/L — ABNORMAL HIGH (ref 39–117)
BUN: 9 mg/dL (ref 6–23)
Potassium: 4 mEq/L (ref 3.5–5.1)
Sodium: 139 mEq/L (ref 135–145)
Total Protein: 5.8 g/dL — ABNORMAL LOW (ref 6.0–8.3)

## 2013-01-14 LAB — HEPATIC FUNCTION PANEL
Bilirubin, Direct: <0.1 mg/dL (ref 0.0–0.3)
Total Protein: 5.6 g/dL — ABNORMAL LOW (ref 6.0–8.3)

## 2013-01-14 LAB — CBC
Hemoglobin: 12.1 g/dL (ref 12.0–15.0)
MCH: 29.7 pg (ref 26.0–34.0)
RBC: 4.08 MIL/uL (ref 3.87–5.11)
WBC: 15.3 10*3/uL — ABNORMAL HIGH (ref 4.0–10.5)

## 2013-01-14 MED ORDER — PANTOPRAZOLE SODIUM 40 MG PO TBEC
40.0000 mg | DELAYED_RELEASE_TABLET | Freq: Every day | ORAL | Status: DC
  Administered 2013-01-14 – 2013-01-20 (×7): 40 mg via ORAL
  Filled 2013-01-14 (×6): qty 1

## 2013-01-14 MED ORDER — METHOCARBAMOL 750 MG PO TABS
750.0000 mg | ORAL_TABLET | Freq: Two times a day (BID) | ORAL | Status: DC | PRN
  Administered 2013-01-14 – 2013-01-20 (×7): 750 mg via ORAL
  Filled 2013-01-14 (×8): qty 1

## 2013-01-14 MED ORDER — HYDROMORPHONE HCL PF 1 MG/ML IJ SOLN
1.0000 mg | INTRAMUSCULAR | Status: DC | PRN
  Administered 2013-01-14 – 2013-01-17 (×7): 1 mg via INTRAVENOUS
  Filled 2013-01-14 (×8): qty 1

## 2013-01-14 NOTE — Progress Notes (Signed)
Patient ID: [name] [name], female   DOB: 1956-08-22, 57 y.o.   MRN: [id number] 3 Days Post-Op  Subjective: Pt feels better today.  Eating better  Objective: Vital signs in last 24 hours: Temp:  [97.4 F (36.3 C)-98.4 F (36.9 C)] 97.4 F (36.3 C) (01/19 0624) Pulse Rate:  [69-78] 69  (01/19 0624) Resp:  [20] 20  (01/19 0624) BP: (110-142)/(73-94) 142/73 mmHg (01/19 0624) SpO2:  [92 %-96 %] 96 % (01/19 0740) Last BM Date: 01/13/13  Intake/Output from previous day: 01/18 0701 - 01/19 0700 In: 720 [P.O.:720] Out: 2120 [Urine:1900; Drains:220] Intake/Output this shift:    PE: Abd: soft, JP with serous output, appropriately tender, obese, incisions c/d/i  Lab Results:   Basename 01/14/13 0540 01/13/13 0910  WBC 15.3* 13.4*  HGB 12.1 12.4  HCT 36.7 37.5  PLT 267 263   BMET  Basename 01/14/13 0540 01/13/13 0910  NA 139 138  K 4.0 3.9  CL 98 98  CO2 32 31  GLUCOSE 102* 106*  BUN 9 10  CREATININE 0.53 0.45*  CALCIUM 8.7 8.8   PT/INR No results found for this basename: LABPROT:2,INR:2 in the last 72 hours CMP     Component Value Date/Time   NA 139 01/14/2013 0540   K 4.0 01/14/2013 0540   CL 98 01/14/2013 0540   CO2 32 01/14/2013 0540   GLUCOSE 102* 01/14/2013 0540   BUN 9 01/14/2013 0540   CREATININE 0.53 01/14/2013 0540   CALCIUM 8.7 01/14/2013 0540   PROT 5.8* 01/14/2013 0540   PROT 5.6* 01/14/2013 0540   ALBUMIN 1.9* 01/14/2013 0540   ALBUMIN 1.8* 01/14/2013 0540   AST 24 01/14/2013 0540   AST 21 01/14/2013 0540   ALT 30 01/14/2013 0540   ALT 30 01/14/2013 0540   ALKPHOS 156* 01/14/2013 0540   ALKPHOS 152* 01/14/2013 0540   BILITOT 0.2* 01/14/2013 0540   BILITOT 0.2* 01/14/2013 0540   GFRNONAA >90 01/14/2013 0540   GFRAA >90 01/14/2013 0540   Lipase     Component Value Date/Time   LIPASE 50 01/10/2013 0500       Studies/Results: No results found.  Anti-infectives: Anti-infectives     Start     Dose/Rate Route Frequency Ordered Stop   01/06/13 1100    piperacillin-tazobactam (ZOSYN) IVPB 3.375 g  Status:  Discontinued        3.375 g 12.5 mL/hr over 240 Minutes Intravenous 3 times per day 01/06/13 1045 01/09/13 0930           Assessment/Plan  1. Gallstone pancreatitis, s/p lap chole 2. Choledocholithiasis   Plan: 1. Patient feels better today, but WBC trending up.  LFTs remain stable; however, question if her WBC is trending up secondary to these stones in her CBD.  Given that the patient had pancreatitis from these stones and her WBC increasing, may be beneficial to see if GI can re-evaluate to consider ERCP for stone removal.   LOS: 10 days    Jamesha Ellsworth E 01/14/2013, 9:01 AM Pager: [phone]

## 2013-01-14 NOTE — Progress Notes (Signed)
Agree with A&P of KO,PA Patient improved from yesterday, but increased wbc is a bit worrisomne

## 2013-01-14 NOTE — Progress Notes (Signed)
[facility] PROGRESS NOTE  [name] [name] ZOX:[id number] DOB: Nov 04, 1956 DOA: 01/04/2013 PCP: Kaleen Mask, MD  Assessment/Plan: Acute pancreatitis secondary to gallstones - Her abdominal pain continues to improve and she is able to tolerate a by mouth diet. She is now status post cholecystectomy on 01/11/2013.  - no change in LFTs which suggests that stones are not obstructive, so no ERCP planned. Liver function tests today are stable.  2. Leukocytosis  - Her white count is up to 15 today. We will do a CT scan of the abdomen with contrast, to look etiologies. I appreciate gastroenterology input.  3. Hypoxic and hypercarbic respiratory failure - This appears resolved, she is currently on room air.  4. Abdominal pain - Likely secondary to acute pancreatitis we'll continue pain management. Her pain is getting better and we'll try to transition her to by mouth medications.   5. Hyperlipidemia - On admission her total cholesterol was elevated at 225, LDL at 143 - She will likely benefit from starting a lipid-lowering agent on discharge  Code Status: Full Family Communication: patient, sister in law Disposition Plan: TBD  Consultants:  Surgery Gastroenterology  Procedures:  none  Antibiotics:  Piptazo (stopped 1.14.14)  HPI/Subjective: In no acute distress, she seems to have less abdominal tenderness  Objective: Filed Vitals:   01/13/13 1953 01/13/13 2113 01/14/13 0624 01/14/13 0740  BP:  110/94 142/73   Pulse:  78 69   Temp:  98.4 F (36.9 C) 97.4 F (36.3 C)   TempSrc:  Oral    Resp:  20 20   Height:      Weight:      SpO2: 92% 92% 94% 96%    Intake/Output Summary (Last 24 hours) at 01/14/13 1227 Last data filed at 01/14/13 0500  Gross per 24 hour  Intake    360 ml  Output    470 ml  Net   -110 ml   Filed Weights   01/05/13 0001 01/08/13 1214  Weight: 125.646 kg (277 lb) 138.7 kg (305 lb 12.5 oz)    Exam:  General:   NAD  Cardiovascular: RRR, no MRG, however exam limited due to the body habitus  Respiratory: coarse breath sounds, mostly at the bases  Abdomen: soft, diffusely tender to palpation mainly in the epigastric area. She's not as tender as yesterday. Post surgical drain in place.  Data Reviewed: Basic Metabolic Panel:  Lab 01/14/13 8119 01/13/13 0910 01/11/13 0600 01/10/13 0500 01/09/13 0818  NA 139 138 141 139 142  K 4.0 3.9 4.4 3.9 3.3*  CL 98 98 102 100 100  CO2 32 31 32 31 32  GLUCOSE 102* 106* 104* 122* 97  BUN 9 10 18 19 20   CREATININE 0.53 0.45* 0.47* 0.39* 0.46*  CALCIUM 8.7 8.8 9.0 8.8 8.8  MG -- -- -- 2.2 2.5  PHOS -- -- -- 1.2* 1.7*   Liver Function Tests:  Lab 01/14/13 0540 01/13/13 0910 01/12/13 0507 01/10/13 0500 01/09/13 0818  AST 2421 25 38* 16 17  ALT 3030 36* 48* 33 39*  ALKPHOS 156*152* 202* 241* 118* 150*  BILITOT 0.2*0.2* 0.3 0.3 0.3 0.4  PROT 5.8*5.6* 5.8* 5.8* 6.2 6.1  ALBUMIN 1.9*1.8* 1.9* 1.9* 2.0* 2.1*    Lab 01/10/13 0500 01/09/13 0818 01/08/13 0730  LIPASE 50 35 45  AMYLASE -- -- --   No results found for this basename: AMMONIA:5 in the last 168 hours CBC:  Lab 01/14/13 0540 01/13/13 0910 01/11/13 0600 01/10/13 0500 01/09/13 0818  WBC 15.3* 13.4* 12.8* 13.8* 12.1*  NEUTROABS -- -- -- 10.7* 9.9*  HGB 12.1 12.4 12.4 12.1 12.4  HCT 36.7 37.5 38.0 37.2 38.7  MCV 90.0 89.9 91.6 91.0 92.1  PLT 267 263 281 267 249   CBG:  Lab 01/09/13 1208 01/09/13 0846 01/09/13 0438 01/09/13 0004 01/08/13 2004  GLUCAP 128* 111* 97 100* 87    Recent Results (from the past 240 hour(s))  MRSA PCR SCREENING     Status: Normal   Collection Time   01/08/13 12:16 PM      Component Value Range Status Comment   MRSA by PCR NEGATIVE  NEGATIVE Final      Scheduled Meds:    . ipratropium  0.5 mg Nebulization TID   And  . albuterol  2.5 mg Nebulization TID  . pantoprazole  40 mg Oral Q1200  . polyethylene glycol  17 g Oral Daily   Continuous  Infusions:    . sodium chloride 10 mL/hr (01/13/13 1039)    Principal Problem:  *Gallstone pancreatitis Active Problems:  Common biliary duct obstruction  Leukocytosis  Acute respiratory failure: hypoxic and hypercarbic  Atelectasis  Pleural effusion  Hypotension   [name] [name]  [facility] Pager [phone]. If 7PM-7AM, please contact night-coverage at [email or web address], password [facility] 01/14/2013, 12:27 PM  LOS: 10 days

## 2013-01-14 NOTE — Progress Notes (Signed)
[facility] Progress Note  Subjective: Called back to see patient because of concerns about high white blood cell count. Her white blood cell count is 15,000 today. It is never been loaded 12,000 since admission said this is a slight rising trend. She states her pain is getting a little better but she looks uncomfortable. We have been consulted because of some small amount of sludge suggested an MRCP but Dr. Dulce Sellar opted not to pursue ERCP. Her liver function tests normalized soon after she came in and have remained normal.  Objective: Vital signs in last 24 hours: Temp:  [97.4 F (36.3 C)-98.4 F (36.9 C)] 97.4 F (36.3 C) (01/19 0624) Pulse Rate:  [69-78] 69  (01/19 0624) Resp:  [20] 20  (01/19 0624) BP: (110-142)/(73-94) 142/73 mmHg (01/19 0624) SpO2:  [92 %-96 %] 96 % (01/19 0740) Weight change:    PE: She looks mildly uncomfortable very obese abdomen soft moderately tender across the upper abdomen  Lab Results: Results for orders placed during the hospital encounter of 01/04/13 (from the past 24 hour(s))  COMPREHENSIVE METABOLIC PANEL     Status: Abnormal   Collection Time   01/14/13  5:40 AM      Component Value Range   Sodium 139  135 - 145 mEq/L   Potassium 4.0  3.5 - 5.1 mEq/L   Chloride 98  96 - 112 mEq/L   CO2 32  19 - 32 mEq/L   Glucose, Bld 102 (*) 70 - 99 mg/dL   BUN 9  6 - 23 mg/dL   Creatinine, Ser 4.54  0.50 - 1.10 mg/dL   Calcium 8.7  8.4 - 09.8 mg/dL   Total Protein 5.8 (*) 6.0 - 8.3 g/dL   Albumin 1.9 (*) 3.5 - 5.2 g/dL   AST 24  0 - 37 U/L   ALT 30  0 - 35 U/L   Alkaline Phosphatase 156 (*) 39 - 117 U/L   Total Bilirubin 0.2 (*) 0.3 - 1.2 mg/dL   GFR calc non Af Amer >90  >90 mL/min   GFR calc Af Amer >90  >90 mL/min  HEPATIC FUNCTION PANEL     Status: Abnormal   Collection Time   01/14/13  5:40 AM      Component Value Range   Total Protein 5.6 (*) 6.0 - 8.3 g/dL   Albumin 1.8 (*) 3.5 - 5.2 g/dL   AST 21  0 - 37 U/L   ALT 30  0 - 35 U/L     Alkaline Phosphatase 152 (*) 39 - 117 U/L   Total Bilirubin 0.2 (*) 0.3 - 1.2 mg/dL   Bilirubin, Direct <1.1  0.0 - 0.3 mg/dL   Indirect Bilirubin NOT CALCULATED  0.3 - 0.9 mg/dL  CBC     Status: Abnormal   Collection Time   01/14/13  5:40 AM      Component Value Range   WBC 15.3 (*) 4.0 - 10.5 K/uL   RBC 4.08  3.87 - 5.11 MIL/uL   Hemoglobin 12.1  12.0 - 15.0 g/dL   HCT 91.4  78.2 - 95.6 %   MCV 90.0  78.0 - 100.0 fL   MCH 29.7  26.0 - 34.0 pg   MCHC 33.0  30.0 - 36.0 g/dL   RDW 21.3  08.6 - 57.8 %   Platelets 267  150 - 400 K/uL    Studies/Results: No results found.    Assessment: Persistent elevated white blood cell count after pancreatitis. Soft suggestion of  choledocholithiasis with sludge seen on MRCP but liver function test persistently normal  Plan: I would favor an abdominal CT scan pancreatic protocol to see if she is developing complications from pancreatitis which seems more likely than biliary obstruction is developing infection. Her last CT scan was on January 11.  Zachary Lovins C 01/14/2013, 10:27 AM

## 2013-01-15 ENCOUNTER — Inpatient Hospital Stay (HOSPITAL_COMMUNITY): Payer: Medicare Other

## 2013-01-15 LAB — CBC
HCT: 37.3 % (ref 36.0–46.0)
Hemoglobin: 11.9 g/dL — ABNORMAL LOW (ref 12.0–15.0)
MCV: 89.2 fL (ref 78.0–100.0)
RBC: 4.18 MIL/uL (ref 3.87–5.11)
WBC: 15.4 10*3/uL — ABNORMAL HIGH (ref 4.0–10.5)

## 2013-01-15 LAB — BASIC METABOLIC PANEL
BUN: 9 mg/dL (ref 6–23)
CO2: 32 mEq/L (ref 19–32)
Chloride: 99 mEq/L (ref 96–112)
Creatinine, Ser: 0.57 mg/dL (ref 0.50–1.10)
GFR calc Af Amer: >90 mL/min (ref >90)
Glucose, Bld: 119 mg/dL — ABNORMAL HIGH (ref 70–99)
Potassium: 4.7 mEq/L (ref 3.5–5.1)

## 2013-01-15 LAB — HEPATIC FUNCTION PANEL
ALT: 25 U/L (ref 0–35)
AST: 20 U/L (ref 0–37)
Alkaline Phosphatase: 145 U/L — ABNORMAL HIGH (ref 39–117)
Bilirubin, Direct: 0.1 mg/dL (ref 0.0–0.3)
Indirect Bilirubin: 0.1 mg/dL — ABNORMAL LOW (ref 0.3–0.9)
Total Bilirubin: 0.2 mg/dL — ABNORMAL LOW (ref 0.3–1.2)

## 2013-01-15 MED ORDER — IOHEXOL 300 MG/ML  SOLN
100.0000 mL | Freq: Once | INTRAMUSCULAR | Status: AC | PRN
  Administered 2013-01-15: 100 mL via INTRAVENOUS

## 2013-01-15 MED ORDER — IOHEXOL 300 MG/ML  SOLN
25.0000 mL | INTRAMUSCULAR | Status: AC
  Administered 2013-01-15 (×2): 25 mL via ORAL

## 2013-01-15 NOTE — Progress Notes (Signed)
4 Days Post-Op  Subjective: Having pain in RUQ.  Objective: Vital signs in last 24 hours: Temp:  [97.9 F (36.6 C)-98.8 F (37.1 C)] 97.9 F (36.6 C) (01/20 0520) Pulse Rate:  [65-83] 65  (01/20 0520) Resp:  [16-17] 17  (01/20 0520) BP: (116-148)/(63-74) 134/72 mmHg (01/20 0520) SpO2:  [93 %-97 %] 93 % (01/20 0752) Last BM Date: 01/14/13  Intake/Output from previous day: 01/19 0701 - 01/20 0700 In: -  Out: 80 [Drains:80] Intake/Output this shift:    PE: General- In NAD Abdomen-soft, mild RUQ tenderness, drain output serous, wounds clean and intact  Lab Results:   Basename 01/15/13 0751 01/14/13 0540  WBC 15.4* 15.3*  HGB 11.9* 12.1  HCT 37.3 36.7  PLT 246 267   BMET  Basename 01/15/13 0751 01/14/13 0540  NA 138 139  K 4.7 4.0  CL 99 98  CO2 32 32  GLUCOSE 119* 102*  BUN 9 9  CREATININE 0.57 0.53  CALCIUM 9.0 8.7   PT/INR No results found for this basename: LABPROT:2,INR:2 in the last 72 hours Comprehensive Metabolic Panel:    Component Value Date/Time   NA 138 01/15/2013 0751   K 4.7 01/15/2013 0751   CL 99 01/15/2013 0751   CO2 32 01/15/2013 0751   BUN 9 01/15/2013 0751   CREATININE 0.57 01/15/2013 0751   GLUCOSE 119* 01/15/2013 0751   CALCIUM 9.0 01/15/2013 0751   AST 20 01/15/2013 0751   ALT 25 01/15/2013 0751   ALKPHOS 145* 01/15/2013 0751   BILITOT 0.2* 01/15/2013 0751   PROT 5.8* 01/15/2013 0751   ALBUMIN 2.0* 01/15/2013 0751     Studies/Results: No results found.  Anti-infectives: Anti-infectives     Start     Dose/Rate Route Frequency Ordered Stop   01/06/13 1100   piperacillin-tazobactam (ZOSYN) IVPB 3.375 g  Status:  Discontinued        3.375 g 12.5 mL/hr over 240 Minutes Intravenous 3 times per day 01/06/13 1045 01/09/13 0930          Assessment 1. Gallstone pancreatitis, s/p lap chole with IOC 1/16-still with some RUQ pain 2. Choledocholithiasis suggested on IOC-Alk Phos slightly elevated, other LFTs not elevated. 3.   Leukocytosis-CT ordered.      LOS: 11 days   Plan: Await CT results.   Timon Geissinger J 01/15/2013

## 2013-01-15 NOTE — Progress Notes (Signed)
[facility] PROGRESS NOTE  [name] [name] ZOX:[id number] DOB: 1956/06/21 DOA: 01/04/2013 PCP: Kaleen Mask, MD  Assessment/Plan: Acute pancreatitis secondary to gallstones - Her abdominal pain continues to improve and she is able to tolerate a by mouth diet. She is now status post cholecystectomy on 01/11/2013.  - no change in LFTs which suggests that stones are not obstructive, so no ERCP planned. Liver function tests today are stable. - CT abdomen today pending.   2. Leukocytosis  - Her white count is up to 15 yesterday and stable today. We will do a CT scan of the abdomen with contrast, to look etiologies. I appreciate gastroenterology input.  3. Hypoxic and hypercarbic respiratory failure - This appears resolved, she is currently on room air.  4. Abdominal pain - Likely secondary to acute pancreatitis we'll continue pain management. Her pain is getting better and we'll try to transition her to by mouth medications.   5. Hyperlipidemia - On admission her total cholesterol was elevated at 225, LDL at 143 - She will likely benefit from starting a lipid-lowering agent on discharge  Code Status: Full Family Communication: patient, sister in law Disposition Plan: if CT scan negative and WBC gets better, d/c tomorrow.   Consultants:  Surgery  GI   Procedures:  none  Antibiotics:  Piptazo (stopped 1.14.14)  HPI/Subjective: In no acute distress, she seems to have less abdominal tenderness  Objective: Filed Vitals:   01/14/13 2006 01/14/13 2154 01/15/13 0520 01/15/13 0752  BP:  116/63 134/72   Pulse:  83 65   Temp:  98.8 F (37.1 C) 97.9 F (36.6 C)   TempSrc:  Oral    Resp:  17 17   Height:      Weight:      SpO2: 97% 95% 95% 93%    Intake/Output Summary (Last 24 hours) at 01/15/13 1306 Last data filed at 01/15/13 0520  Gross per 24 hour  Intake      0 ml  Output     80 ml  Net    -80 ml   Filed Weights   01/05/13 0001 01/08/13 1214  Weight:  125.646 kg (277 lb) 138.7 kg (305 lb 12.5 oz)    Exam:  General:  NAD  Cardiovascular: RRR, no MRG, however exam limited due to the body habitus  Respiratory: coarse breath sounds, mostly at the bases  Abdomen: soft, diffusely tender to palpation mainly in the epigastric area. She's not as tender as yesterday. Post surgical drain in place.  Data Reviewed: Basic Metabolic Panel:  Lab 01/15/13 8119 01/14/13 0540 01/13/13 0910 01/11/13 0600 01/10/13 0500 01/09/13 0818  NA 138 139 138 141 139 --  K 4.7 4.0 3.9 4.4 3.9 --  CL 99 98 98 102 100 --  CO2 32 32 31 32 31 --  GLUCOSE 119* 102* 106* 104* 122* --  BUN 9 9 10 18 19  --  CREATININE 0.57 0.53 0.45* 0.47* 0.39* --  CALCIUM 9.0 8.7 8.8 9.0 8.8 --  MG -- -- -- -- 2.2 2.5  PHOS -- -- -- -- 1.2* 1.7*   Liver Function Tests:  Lab 01/15/13 0751 01/14/13 0540 01/13/13 0910 01/12/13 0507 01/10/13 0500  AST 20 2421 25 38* 16  ALT 25 3030 36* 48* 33  ALKPHOS 145* 156*152* 202* 241* 118*  BILITOT 0.2* 0.2*0.2* 0.3 0.3 0.3  PROT 5.8* 5.8*5.6* 5.8* 5.8* 6.2  ALBUMIN 2.0* 1.9*1.8* 1.9* 1.9* 2.0*    Lab 01/10/13 0500 01/09/13 0818  LIPASE 50  35  AMYLASE -- --   No results found for this basename: AMMONIA:5 in the last 168 hours CBC:  Lab 01/15/13 0751 01/14/13 0540 01/13/13 0910 01/11/13 0600 01/10/13 0500 01/09/13 0818  WBC 15.4* 15.3* 13.4* 12.8* 13.8* --  NEUTROABS -- -- -- -- 10.7* 9.9*  HGB 11.9* 12.1 12.4 12.4 12.1 --  HCT 37.3 36.7 37.5 38.0 37.2 --  MCV 89.2 90.0 89.9 91.6 91.0 --  PLT 246 267 263 281 267 --   CBG:  Lab 01/09/13 1208 01/09/13 0846 01/09/13 0438 01/09/13 0004 01/08/13 2004  GLUCAP 128* 111* 97 100* 87   Recent Results (from the past 240 hour(s))  MRSA PCR SCREENING     Status: Normal   Collection Time   01/08/13 12:16 PM      Component Value Range Status Comment   MRSA by PCR NEGATIVE  NEGATIVE Final    Scheduled Meds:    . pantoprazole  40 mg Oral Q1200  . polyethylene glycol  17 g  Oral Daily   Continuous Infusions:    . sodium chloride 10 mL/hr (01/13/13 1039)   Principal Problem:  *Gallstone pancreatitis Active Problems:  Common biliary duct obstruction  Leukocytosis  Acute respiratory failure: hypoxic and hypercarbic  Atelectasis  Pleural effusion  Hypotension   [name]  [facility] Pager [phone]. If 7PM-7AM, please contact night-coverage at [email or web address], password [facility] 01/15/2013, 1:06 PM  LOS: 11 days

## 2013-01-15 NOTE — Progress Notes (Signed)
[facility] Progress Note  Subjective: Patient still having generalized abdominal pain  Objective: Vital signs in last 24 hours: Temp:  [97.9 F (36.6 C)-98.8 F (37.1 C)] 97.9 F (36.6 C) (01/20 0520) Pulse Rate:  [65-83] 65  (01/20 0520) Resp:  [17] 17  (01/20 0520) BP: (116-134)/(63-72) 134/72 mmHg (01/20 0520) SpO2:  [93 %-97 %] 93 % (01/20 0752) Weight change:    PE: Abdomen soft slightly distended diffusely tender  Lab Results: Results for orders placed during the hospital encounter of 01/04/13 (from the past 24 hour(s))  CBC     Status: Abnormal   Collection Time   01/15/13  7:51 AM      Component Value Range   WBC 15.4 (*) 4.0 - 10.5 K/uL   RBC 4.18  3.87 - 5.11 MIL/uL   Hemoglobin 11.9 (*) 12.0 - 15.0 g/dL   HCT 16.1  09.6 - 04.5 %   MCV 89.2  78.0 - 100.0 fL   MCH 28.5  26.0 - 34.0 pg   MCHC 31.9  30.0 - 36.0 g/dL   RDW 40.9  81.1 - 91.4 %   Platelets 246  150 - 400 K/uL  BASIC METABOLIC PANEL     Status: Abnormal   Collection Time   01/15/13  7:51 AM      Component Value Range   Sodium 138  135 - 145 mEq/L   Potassium 4.7  3.5 - 5.1 mEq/L   Chloride 99  96 - 112 mEq/L   CO2 32  19 - 32 mEq/L   Glucose, Bld 119 (*) 70 - 99 mg/dL   BUN 9  6 - 23 mg/dL   Creatinine, Ser 7.82  0.50 - 1.10 mg/dL   Calcium 9.0  8.4 - 95.6 mg/dL   GFR calc non Af Amer >90  >90 mL/min   GFR calc Af Amer >90  >90 mL/min  HEPATIC FUNCTION PANEL     Status: Abnormal   Collection Time   01/15/13  7:51 AM      Component Value Range   Total Protein 5.8 (*) 6.0 - 8.3 g/dL   Albumin 2.0 (*) 3.5 - 5.2 g/dL   AST 20  0 - 37 U/L   ALT 25  0 - 35 U/L   Alkaline Phosphatase 145 (*) 39 - 117 U/L   Total Bilirubin 0.2 (*) 0.3 - 1.2 mg/dL   Bilirubin, Direct 0.1  0.0 - 0.3 mg/dL   Indirect Bilirubin 0.1 (*) 0.3 - 0.9 mg/dL    Studies/Results: Ct Abdomen Pelvis W Contrast  01/15/2013  *RADIOLOGY REPORT*  Clinical Data: Acute pancreatitis.  CT ABDOMEN AND PELVIS WITH CONTRAST   Technique:  Multidetector CT imaging of the abdomen and pelvis was performed following the standard protocol during bolus administration of intravenous contrast.  Contrast: OMNIPAQUE IOHEXOL 300 MG/ML  SOLN  Comparison: 01/05/2013.  Findings: The lung bases demonstrate bilateral pleural effusions, left greater than right with overlying atelectasis.  These are new.  The liver demonstrates stable intrahepatic biliary dilatation.  No focal hepatic lesions.  The gallbladder is surgically absent. Stable common bile duct dilatation.  The spleen is mildly enlarged but but no focal lesions.  There is a large complex fluid collection surrounding the spleen with a thin enhancing membrane. This is most likely a pseudocyst.  There are also multiple complex pseudocyst surrounding the pancreas and in the splenic hilum and continuing down into the small bowel mesentery.  The pancreas demonstrates normal enhancement.  No  findings to suggest pancreatic necrosis.  No hemorrhage or pseudoaneurysm.  The pancreatic duct is normal in caliber.  The adrenal glands and kidneys are unremarkable.  The stomach, duodenum, small bowel and colon grossly normal.  No findings for small bowel obstruction.  There is a large abdominal wall hernia containing small bowel and colon but no findings for obstruction/ incarceration.  There is extensive subcutaneous soft tissue swelling/edema which could suggest anasarca or cellulitis.  The bladder is unremarkable.  No pelvic mass or significant free pelvic fluid collection.  Surgical changes involving the lumbar spine are again demonstrated.  IMPRESSION:  1.  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 2.  No definite findings for abscess and no evidence of pancreatic necrosis. 3.  New bilateral pleural effusions, left greater than right with overlying atelectasis. 4.  Stable biliary dilatation. 5.  Large abdominal wall hernia.  6.  New subcutaneous edema could suggest anasarca or cellulitis.   Original Report Authenticated By: Rudie Meyer, M.D.       Assessment: Pancreatitis with apparent subsequent development of pseudocyst. Some suspicion of common bile duct stones but LFTs for the most part normal and no stone seen on any imaging studies to date although there was poor drainage at the time of her intraoperative cholangiogram.   Plan: Overall I do not think the level of suspicion of common bile duct stone being the main cause of her leukocytosis and pain relative to the pancreatic findings warrants ERCP. Would consider MRCP. If the patient's claustrophobia can be overcome. She states it needs to be an open atmosphere before she can tolerate it even with sedation. I was unable to contact anyone from nuclear medicine to discuss the specifics of MRCP in that regard. Will hold off for now and await Dr. Maurie Boettcher recommendations. We'll continue to follow   Artasia Thang C 01/15/2013, 2:55 PM

## 2013-01-16 ENCOUNTER — Inpatient Hospital Stay (HOSPITAL_COMMUNITY): Payer: Medicare Other

## 2013-01-16 LAB — COMPREHENSIVE METABOLIC PANEL
ALT: 26 U/L (ref 0–35)
AST: 21 U/L (ref 0–37)
Albumin: 2.3 g/dL — ABNORMAL LOW (ref 3.5–5.2)
Alkaline Phosphatase: 160 U/L — ABNORMAL HIGH (ref 39–117)
Potassium: 4.5 mEq/L (ref 3.5–5.1)
Sodium: 139 mEq/L (ref 135–145)
Total Protein: 6.4 g/dL (ref 6.0–8.3)

## 2013-01-16 LAB — CBC
HCT: 38.3 % (ref 36.0–46.0)
MCH: 28.7 pg (ref 26.0–34.0)
MCHC: 32.9 g/dL (ref 30.0–36.0)
MCV: 89.5 fL (ref 78.0–100.0)
RDW: 14.2 % (ref 11.5–15.5)
RDW: 14.1 % (ref 11.5–15.5)
WBC: 15.5 10*3/uL — ABNORMAL HIGH (ref 4.0–10.5)
WBC: 18.2 10*3/uL — ABNORMAL HIGH (ref 4.0–10.5)

## 2013-01-16 LAB — BASIC METABOLIC PANEL
BUN: 11 mg/dL (ref 6–23)
CO2: 30 mEq/L (ref 19–32)
Calcium: 8.8 mg/dL (ref 8.4–10.5)
Chloride: 98 mEq/L (ref 96–112)
Creatinine, Ser: 0.62 mg/dL (ref 0.50–1.10)
GFR calc Af Amer: >90 mL/min (ref >90)

## 2013-01-16 LAB — HEPATIC FUNCTION PANEL
ALT: 24 U/L (ref 0–35)
Alkaline Phosphatase: 147 U/L — ABNORMAL HIGH (ref 39–117)
Bilirubin, Direct: <0.1 mg/dL (ref 0.0–0.3)

## 2013-01-16 MED ORDER — LORAZEPAM 2 MG/ML IJ SOLN
0.5000 mg | INTRAMUSCULAR | Status: AC
  Administered 2013-01-16: 0.5 mg via INTRAVENOUS
  Filled 2013-01-16: qty 1

## 2013-01-16 NOTE — Progress Notes (Signed)
[facility] PROGRESS NOTE  [name] [name] MRN:[id number] DOB: 03/28/1956 DOA: 01/04/2013 PCP: ELKINS,WILSON OLIVER, MD  Assessment/Plan: Acute pancreatitis secondary to gallstones - more abdominal pain this morning - CT scan showed   1. 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 2. No definite findings for abscess and no evidence of pancreatic necrosis. 3. New bilateral pleural effusions, left greater than right with overlying atelectasis. 4. Stable biliary dilatation. 5. Large abdominal wall hernia. 6. New subcutaneous edema could suggest anasarca or cellulitis.  - will try to obtain MRI/MRCP today. I would like to avoid, if possible conscious sedation given her event last week requiring stepdown/ICU transfer 2/2 respiratory decline. Will try benzodiazepines first.   2. Leukocytosis  - Her white count is up to 15 yesterday and stable today.  - CT mentions subcutaneous anasarca vs cellulitis. I cannot appreciate any cellulitic evidence on the skin today.  - will continue to closely monitor.  3. Hypoxic and hypercarbic respiratory failure - This appears resolved, she is currently on room air.  4. Abdominal pain - Likely secondary to acute pancreatitis we'll continue pain management.   5. Hyperlipidemia - On admission her total cholesterol was elevated at 225, LDL at 143 - She will likely benefit from starting a lipid-lowering agent on discharge  Code Status: Full Family Communication: patient, sister in law Disposition Plan: if CT scan negative and WBC gets better, d/c tomorrow.   Consultants:  Surgery  GI  Procedures:  none  Antibiotics:  Piptazo (stopped 1.14.14)  HPI/Subjective: In no acute distress, she seems to have less abdominal tenderness  Objective: Filed Vitals:   01/15/13 0752 01/15/13 1526 01/15/13 2106 01/16/13 0454  BP:  108/57 119/60 111/56  Pulse:  84 76 75  Temp:   98.6 F (37 C) 98.7 F (37.1 C) 98.2 F (36.8 C)  TempSrc:   Oral Oral  Resp:  18 22 20  Height:      Weight:      SpO2: 93% 93% 96% 96%    Intake/Output Summary (Last 24 hours) at 01/16/13 1258 Last data filed at 01/16/13 0400  Gross per 24 hour  Intake    480 ml  Output     90 ml  Net    390 ml   Filed Weights   01/05/13 0001 01/08/13 1214  Weight: 125.646 kg (277 lb) 138.7 kg (305 lb 12.5 oz)    Exam:  General:  NAD  Cardiovascular: RRR, no MRG, however exam limited due to the body habitus  Respiratory: coarse breath sounds, mostly at the bases  Abdomen: soft, diffusely tender to palpation mainly in the epigastric area. She's not as tender as yesterday. Post surgical drain in place.  Skin: no erythema, no cellulitic changes.   Data Reviewed: Basic Metabolic Panel:  Lab 01/16/13 0920 01/16/13 0600 01/15/13 0751 01/14/13 0540 01/13/13 0910 01/10/13 0500  NA 139 137 138 139 138 --  K 4.5 4.5 4.7 4.0 3.9 --  CL 99 98 99 98 98 --  CO2 30 30 32 32 31 --  GLUCOSE 109* 167* 119* 102* 106* --  BUN 11 11 9 9 10 --  CREATININE 0.54 0.62 0.57 0.53 0.45* --  CALCIUM 9.0 8.8 9.0 8.7 8.8 --  MG -- -- -- -- -- 2.2  PHOS -- -- -- -- -- 1.2*   Liver Function Tests:  Lab 01/16/13 0920 01/16/13 0600 01/15/13 0751 01/14/13 0540 01/13/13 0910  AST   21 19 20 2421 25  ALT 26 24 25 3030 36*  ALKPHOS 160* 147* 145* 156*152* 202*  BILITOT 0.2* 0.2* 0.2* 0.2*0.2* 0.3  PROT 6.4 6.0 5.8* 5.8*5.6* 5.8*  ALBUMIN 2.3* 2.0* 2.0* 1.9*1.8* 1.9*    Lab 01/16/13 0920 01/15/13 1458 01/10/13 0500  LIPASE 107* 102* 50  AMYLASE 138* 132* --   No results found for this basename: AMMONIA:5 in the last 168 hours CBC:  Lab 01/16/13 0920 01/16/13 0600 01/15/13 0751 01/14/13 0540 01/13/13 0910 01/10/13 0500  WBC 18.2* 15.5* 15.4* 15.3* 13.4* --  NEUTROABS -- -- -- -- -- 10.7*  HGB 13.0 12.3 11.9* 12.1 12.4 --  HCT 39.5 38.3 37.3 36.7 37.5 --  MCV 89.8 89.5 89.2 90.0 89.9 --  PLT  262 241 246 267 263 --   CBG: No results found for this basename: GLUCAP:5 in the last 168 hours Recent Results (from the past 240 hour(s))  MRSA PCR SCREENING     Status: Normal   Collection Time   01/08/13 12:16 PM      Component Value Range Status Comment   MRSA by PCR NEGATIVE  NEGATIVE Final    Scheduled Meds:    . LORazepam  0.5 mg Intravenous On Call  . pantoprazole  40 mg Oral Q1200  . polyethylene glycol  17 g Oral Daily   Continuous Infusions:    . sodium chloride 10 mL/hr (01/13/13 1039)   Principal Problem:  *Gallstone pancreatitis Active Problems:  Common biliary duct obstruction  Leukocytosis  Acute respiratory failure: hypoxic and hypercarbic  Atelectasis  Pleural effusion  Hypotension   Kirsti Mcalpine  [facility] Pager [phone]. If 7PM-7AM, please contact night-coverage at [email or web address], password [email or web address] 01/16/2013, 12:58 PM  LOS: 12 days   

## 2013-01-16 NOTE — Progress Notes (Signed)
[facility] Progress Note  Subjective: And still experiencing moderate abdominal pain  Objective: Vital signs in last 24 hours: Temp:  [98.2 F (36.8 C)-98.7 F (37.1 C)] 98.2 F (36.8 C) (01/21 0454) Pulse Rate:  [75-84] 75  (01/21 0454) Resp:  [18-22] 20  (01/21 0454) BP: (108-119)/(56-60) 111/56 mmHg (01/21 0454) SpO2:  [93 %-96 %] 96 % (01/21 0454) Weight change:    PE: Unchanged  Lab Results: Results for orders placed during the hospital encounter of 01/04/13 (from the past 24 hour(s))  AMYLASE     Status: Abnormal   Collection Time   01/15/13  2:58 PM      Component Value Range   Amylase 132 (*) 0 - 105 U/L  LIPASE, BLOOD     Status: Abnormal   Collection Time   01/15/13  2:58 PM      Component Value Range   Lipase 102 (*) 11 - 59 U/L  CBC     Status: Abnormal   Collection Time   01/16/13  6:00 AM      Component Value Range   WBC 15.5 (*) 4.0 - 10.5 K/uL   RBC 4.28  3.87 - 5.11 MIL/uL   Hemoglobin 12.3  12.0 - 15.0 g/dL   HCT 01.0  27.2 - 53.6 %   MCV 89.5  78.0 - 100.0 fL   MCH 28.7  26.0 - 34.0 pg   MCHC 32.1  30.0 - 36.0 g/dL   RDW 64.4  03.4 - 74.2 %   Platelets 241  150 - 400 K/uL  BASIC METABOLIC PANEL     Status: Abnormal   Collection Time   01/16/13  6:00 AM      Component Value Range   Sodium 137  135 - 145 mEq/L   Potassium 4.5  3.5 - 5.1 mEq/L   Chloride 98  96 - 112 mEq/L   CO2 30  19 - 32 mEq/L   Glucose, Bld 167 (*) 70 - 99 mg/dL   BUN 11  6 - 23 mg/dL   Creatinine, Ser 5.95  0.50 - 1.10 mg/dL   Calcium 8.8  8.4 - 63.8 mg/dL   GFR calc non Af Amer >90  >90 mL/min   GFR calc Af Amer >90  >90 mL/min  HEPATIC FUNCTION PANEL     Status: Abnormal   Collection Time   01/16/13  6:00 AM      Component Value Range   Total Protein 6.0  6.0 - 8.3 g/dL   Albumin 2.0 (*) 3.5 - 5.2 g/dL   AST 19  0 - 37 U/L   ALT 24  0 - 35 U/L   Alkaline Phosphatase 147 (*) 39 - 117 U/L   Total Bilirubin 0.2 (*) 0.3 - 1.2 mg/dL   Bilirubin, Direct <7.5   0.0 - 0.3 mg/dL   Indirect Bilirubin NOT CALCULATED  0.3 - 0.9 mg/dL  CBC     Status: Abnormal   Collection Time   01/16/13  9:20 AM      Component Value Range   WBC 18.2 (*) 4.0 - 10.5 K/uL   RBC 4.40  3.87 - 5.11 MIL/uL   Hemoglobin 13.0  12.0 - 15.0 g/dL   HCT 64.3  32.9 - 51.8 %   MCV 89.8  78.0 - 100.0 fL   MCH 29.5  26.0 - 34.0 pg   MCHC 32.9  30.0 - 36.0 g/dL   RDW 84.1  66.0 - 63.0 %   Platelets 262  150 - 400 K/uL    Studies/Results: Ct Abdomen Pelvis W Contrast  01/15/2013  *RADIOLOGY REPORT*  Clinical Data: Acute pancreatitis.  CT ABDOMEN AND PELVIS WITH CONTRAST  Technique:  Multidetector CT imaging of the abdomen and pelvis was performed following the standard protocol during bolus administration of intravenous contrast.  Contrast: OMNIPAQUE IOHEXOL 300 MG/ML  SOLN  Comparison: 01/05/2013.  Findings: The lung bases demonstrate bilateral pleural effusions, left greater than right with overlying atelectasis.  These are new.  The liver demonstrates stable intrahepatic biliary dilatation.  No focal hepatic lesions.  The gallbladder is surgically absent. Stable common bile duct dilatation.  The spleen is mildly enlarged but but no focal lesions.  There is a large complex fluid collection surrounding the spleen with a thin enhancing membrane. This is most likely a pseudocyst.  There are also multiple complex pseudocyst surrounding the pancreas and in the splenic hilum and continuing down into the small bowel mesentery.  The pancreas demonstrates normal enhancement.  No findings to suggest pancreatic necrosis.  No hemorrhage or pseudoaneurysm.  The pancreatic duct is normal in caliber.  The adrenal glands and kidneys are unremarkable.  The stomach, duodenum, small bowel and colon grossly normal.  No findings for small bowel obstruction.  There is a large abdominal wall hernia containing small bowel and colon but no findings for obstruction/ incarceration.  There is extensive  subcutaneous soft tissue swelling/edema which could suggest anasarca or cellulitis.  The bladder is unremarkable.  No pelvic mass or significant free pelvic fluid collection.  Surgical changes involving the lumbar spine are again demonstrated.  IMPRESSION:  1.  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 2.  No definite findings for abscess and no evidence of pancreatic necrosis. 3.  New bilateral pleural effusions, left greater than right with overlying atelectasis. 4.  Stable biliary dilatation. 5.  Large abdominal wall hernia. 6.  New subcutaneous edema could suggest anasarca or cellulitis.   Original Report Authenticated By: Rudie Meyer, M.D.       Assessment: Pancreatitis with development of pseudocyst. Some suspicion of common bile duct stones with essentially normal liver function test  Plan: Trying to explore logistics of having patient transferred to an open MRI for MRCP versus having a tear in the hospital with sedation. Await outcome.    Zamya Culhane C 01/16/2013, 9:59 AM

## 2013-01-16 NOTE — Progress Notes (Signed)
5 Days Post-Op  Subjective: She is having pain around the drain site.  No epigastric pain.  Objective: Vital signs in last 24 hours: Temp:  [98.2 F (36.8 C)-98.7 F (37.1 C)] 98.2 F (36.8 C) (01/21 0454) Pulse Rate:  [75-84] 75  (01/21 0454) Resp:  [18-22] 20  (01/21 0454) BP: (108-119)/(56-60) 111/56 mmHg (01/21 0454) SpO2:  [93 %-96 %] 96 % (01/21 0454) Last BM Date: 01/15/13  Intake/Output from previous day: 01/20 0701 - 01/21 0700 In: 600 [P.O.:600] Out: 90 [Drains:90] Intake/Output this shift:    PE: General- In NAD Abdomen-soft, mild RUQ tenderness at the drain site, drain output serous, wounds clean and intact  Lab Results:   Basename 01/16/13 0600 01/15/13 0751  WBC 15.5* 15.4*  HGB 12.3 11.9*  HCT 38.3 37.3  PLT 241 246   BMET  Basename 01/15/13 0751 01/14/13 0540  NA 138 139  K 4.7 4.0  CL 99 98  CO2 32 32  GLUCOSE 119* 102*  BUN 9 9  CREATININE 0.57 0.53  CALCIUM 9.0 8.7   PT/INR No results found for this basename: LABPROT:2,INR:2 in the last 72 hours Comprehensive Metabolic Panel:    Component Value Date/Time   NA 138 01/15/2013 0751   K 4.7 01/15/2013 0751   CL 99 01/15/2013 0751   CO2 32 01/15/2013 0751   BUN 9 01/15/2013 0751   CREATININE 0.57 01/15/2013 0751   GLUCOSE 119* 01/15/2013 0751   CALCIUM 9.0 01/15/2013 0751   AST 20 01/15/2013 0751   ALT 25 01/15/2013 0751   ALKPHOS 145* 01/15/2013 0751   BILITOT 0.2* 01/15/2013 0751   PROT 5.8* 01/15/2013 0751   ALBUMIN 2.0* 01/15/2013 0751     Studies/Results: Ct Abdomen Pelvis W Contrast  01/15/2013  *RADIOLOGY REPORT*  Clinical Data: Acute pancreatitis.  CT ABDOMEN AND PELVIS WITH CONTRAST  Technique:  Multidetector CT imaging of the abdomen and pelvis was performed following the standard protocol during bolus administration of intravenous contrast.  Contrast: OMNIPAQUE IOHEXOL 300 MG/ML  SOLN  Comparison: 01/05/2013.  Findings: The lung bases demonstrate bilateral pleural effusions, left  greater than right with overlying atelectasis.  These are new.  The liver demonstrates stable intrahepatic biliary dilatation.  No focal hepatic lesions.  The gallbladder is surgically absent. Stable common bile duct dilatation.  The spleen is mildly enlarged but but no focal lesions.  There is a large complex fluid collection surrounding the spleen with a thin enhancing membrane. This is most likely a pseudocyst.  There are also multiple complex pseudocyst surrounding the pancreas and in the splenic hilum and continuing down into the small bowel mesentery.  The pancreas demonstrates normal enhancement.  No findings to suggest pancreatic necrosis.  No hemorrhage or pseudoaneurysm.  The pancreatic duct is normal in caliber.  The adrenal glands and kidneys are unremarkable.  The stomach, duodenum, small bowel and colon grossly normal.  No findings for small bowel obstruction.  There is a large abdominal wall hernia containing small bowel and colon but no findings for obstruction/ incarceration.  There is extensive subcutaneous soft tissue swelling/edema which could suggest anasarca or cellulitis.  The bladder is unremarkable.  No pelvic mass or significant free pelvic fluid collection.  Surgical changes involving the lumbar spine are again demonstrated.  IMPRESSION:  1.  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 2.  No definite findings for abscess and no evidence of pancreatic necrosis. 3.  New  bilateral pleural effusions, left greater than right with overlying atelectasis. 4.  Stable biliary dilatation. 5.  Large abdominal wall hernia. 6.  New subcutaneous edema could suggest anasarca or cellulitis.   Original Report Authenticated By: Rudie Meyer, M.D.     Anti-infectives: Anti-infectives     Start     Dose/Rate Route Frequency Ordered Stop   01/06/13 1100   piperacillin-tazobactam (ZOSYN) IVPB 3.375 g  Status:  Discontinued         3.375 g 12.5 mL/hr over 240 Minutes Intravenous 3 times per day 01/06/13 1045 01/09/13 0930          Assessment 1. Gallstone pancreatitis, s/p lap chole with IOC 1/16-CT demonstrates peripancreatic fluid collections suggesting pseudocyst formation; amylase and lipase elevated 2. Choledocholithiasis suggested on IOC- 3.  Leukocytosis-likely secondary to continuing pancreatitis      LOS: 12 days   Plan: Decrease diet.  Will try to arrange for an open MRCP if possible as she has severe claustrophobia and will not tolerate a closed MRI.   Alexcia Schools J 01/16/2013

## 2013-01-16 NOTE — Progress Notes (Signed)
Patient left unit for scheduled MRI, ativan given just before she left floor, MRI called and said patient refused procedure, MD notified.

## 2013-01-17 ENCOUNTER — Inpatient Hospital Stay (HOSPITAL_COMMUNITY): Payer: Medicare Other

## 2013-01-17 DIAGNOSIS — K8050 Calculus of bile duct without cholangitis or cholecystitis without obstruction: Secondary | ICD-10-CM

## 2013-01-17 LAB — GLUCOSE, CAPILLARY: Glucose-Capillary: 96 mg/dL (ref 70–99)

## 2013-01-17 LAB — AMYLASE: Amylase: 161 U/L — ABNORMAL HIGH (ref 0–105)

## 2013-01-17 LAB — CBC
Hemoglobin: 12.2 g/dL (ref 12.0–15.0)
MCH: 28.8 pg (ref 26.0–34.0)
MCHC: 32.5 g/dL (ref 30.0–36.0)

## 2013-01-17 LAB — HEPATIC FUNCTION PANEL
ALT: 25 U/L (ref 0–35)
AST: 24 U/L (ref 0–37)
Albumin: 2.1 g/dL — ABNORMAL LOW (ref 3.5–5.2)
Alkaline Phosphatase: 179 U/L — ABNORMAL HIGH (ref 39–117)
Total Bilirubin: 0.3 mg/dL (ref 0.3–1.2)

## 2013-01-17 LAB — BASIC METABOLIC PANEL
BUN: 8 mg/dL (ref 6–23)
Calcium: 9 mg/dL (ref 8.4–10.5)
GFR calc non Af Amer: >90 mL/min (ref >90)
Glucose, Bld: 98 mg/dL (ref 70–99)
Potassium: 4.3 mEq/L (ref 3.5–5.1)

## 2013-01-17 LAB — LIPASE, BLOOD: Lipase: 116 U/L — ABNORMAL HIGH (ref 11–59)

## 2013-01-17 MED ORDER — SODIUM GLYCEROPHOSPHATE 1 MMOLE/ML IV SOLN
10.0000 mmol | Freq: Once | INTRAVENOUS | Status: AC
  Administered 2013-01-17: 10 mmol via INTRAVENOUS
  Filled 2013-01-17: qty 10

## 2013-01-17 MED ORDER — LORAZEPAM 2 MG/ML IJ SOLN
0.5000 mg | INTRAMUSCULAR | Status: AC
  Administered 2013-01-17: 0.5 mg via INTRAVENOUS
  Filled 2013-01-17: qty 1

## 2013-01-17 MED ORDER — FAT EMULSION 20 % IV EMUL
250.0000 mL | INTRAVENOUS | Status: DC
  Filled 2013-01-17: qty 250

## 2013-01-17 MED ORDER — MIDAZOLAM HCL 2 MG/2ML IJ SOLN: 2.0000 mg | Freq: Once | INTRAMUSCULAR | Status: DC

## 2013-01-17 MED ORDER — FENTANYL CITRATE 0.05 MG/ML IJ SOLN: 25.0000 ug | INTRAMUSCULAR | Status: DC | PRN

## 2013-01-17 MED ORDER — FENTANYL CITRATE 0.05 MG/ML IJ SOLN: 100.0000 ug | Freq: Once | INTRAMUSCULAR | Status: DC

## 2013-01-17 MED ORDER — FENTANYL CITRATE 0.05 MG/ML IJ SOLN
INTRAMUSCULAR | Status: AC
  Filled 2013-01-17: qty 4

## 2013-01-17 MED ORDER — SODIUM GLYCEROPHOSPHATE 1 MMOLE/ML IV SOLN
20.0000 mmol | Freq: Once | INTRAVENOUS | Status: DC
  Filled 2013-01-17: qty 20

## 2013-01-17 MED ORDER — PIPERACILLIN-TAZOBACTAM 3.375 G IVPB 30 MIN
3.3750 g | Freq: Once | INTRAVENOUS | Status: AC
  Administered 2013-01-17: 3.375 g via INTRAVENOUS
  Filled 2013-01-17 (×2): qty 50

## 2013-01-17 MED ORDER — PIPERACILLIN-TAZOBACTAM 3.375 G IVPB
3.3750 g | Freq: Three times a day (TID) | INTRAVENOUS | Status: DC
  Filled 2013-01-17 (×2): qty 50

## 2013-01-17 MED ORDER — SODIUM GLYCEROPHOSPHATE 1 MMOLE/ML IV SOLN
20.0000 mmol | Freq: Once | INTRAVENOUS | Status: AC
  Administered 2013-01-17: 20 mmol via INTRAVENOUS
  Filled 2013-01-17 (×2): qty 20

## 2013-01-17 MED ORDER — FLUMAZENIL 0.5 MG/5ML IV SOLN: 0.2000 mg | Freq: Once | INTRAVENOUS | Status: DC

## 2013-01-17 MED ORDER — MIDAZOLAM HCL 2 MG/2ML IJ SOLN
INTRAMUSCULAR | Status: AC
  Filled 2013-01-17: qty 10

## 2013-01-17 MED ORDER — MIDAZOLAM HCL 2 MG/2ML IJ SOLN: 4.0000 mg | Freq: Once | INTRAMUSCULAR | Status: DC

## 2013-01-17 MED ORDER — MIDAZOLAM HCL 2 MG/2ML IJ SOLN
1.0000 mg | INTRAMUSCULAR | Status: DC | PRN
  Filled 2013-01-17: qty 10

## 2013-01-17 MED ORDER — ALBUTEROL SULFATE (5 MG/ML) 0.5% IN NEBU
INHALATION_SOLUTION | RESPIRATORY_TRACT | Status: AC
  Filled 2013-01-17: qty 1

## 2013-01-17 MED ORDER — ALBUTEROL SULFATE (5 MG/ML) 0.5% IN NEBU
INHALATION_SOLUTION | RESPIRATORY_TRACT | Status: AC
  Filled 2013-01-17: qty 0.5

## 2013-01-17 MED ORDER — SODIUM GLYCEROPHOSPHATE 1 MMOLE/ML IV SOLN
10.0000 mmol | Freq: Once | INTRAVENOUS | Status: DC
  Administered 2013-01-17: 10 mmol via INTRAVENOUS
  Filled 2013-01-17: qty 10

## 2013-01-17 MED ORDER — TRACE MINERALS CR-CU-F-FE-I-MN-MO-SE-ZN IV SOLN
INTRAVENOUS | Status: DC
  Filled 2013-01-17: qty 1000

## 2013-01-17 MED ORDER — INSULIN ASPART 100 UNIT/ML ~~LOC~~ SOLN: 0.0000 [IU] | Freq: Four times a day (QID) | SUBCUTANEOUS | Status: DC

## 2013-01-17 MED ORDER — NALOXONE HCL 0.4 MG/ML IJ SOLN: 0.4000 mg | INTRAMUSCULAR | Status: DC | PRN

## 2013-01-17 NOTE — Progress Notes (Signed)
Patient apparently down getting her MRCP or at least attempting 8. LFTs continue to be normal except for some slightly elevated alkaline phosphatase. We'll await MRCP

## 2013-01-17 NOTE — ED Notes (Signed)
Unable to fit into scanner.  Pt informed and understands inability to complete MRI today.

## 2013-01-17 NOTE — Progress Notes (Signed)
[facility] PROGRESS NOTE  RAYEN PALEN ZOX:[id number] DOB: [birth year] DOA: 01/04/2013 PCP: Kaleen Mask, MD  Brief narrative 57 year old female patient with history of arthritis, asthma and neuromuscular disorder was admitted on 01/04/13 with history of abdominal pain, nausea, vomiting, subjective fever and chills. Evaluation in the ED was consistent with acute biliary pancreatitis. GI and surgery consulted. She was initially treated supportively with hydration, n.p.o. status and analgesics. She developed acute hypoxic respiratory failure on 1/13 secondary to narcotics, benzodiazepines, bilateral atelectasis and +/-ALI. She was transferred to ICU with critical care consultation. She did not require intubation and improved with supportive treatment. She is S/P laparoscopic cholecystectomy with IOC 1/16. Choledocholithiasis suggested an IOC-being evaluated. She was transferred to regular floor.  Assessment/Plan  Acute pancreatitis secondary to gallstones S/P laparoscopic cholecystectomy with IOC 1/16. Choledocholithiasis suggested an IOC-being evaluated. GI and surgery are following. Attempting MRCP under sedation on 1/22. CT abdomen from 1/20 shows neurologic complex pseudocyst surrounding the pancreas and spleen and extending down into the small bowel recently. No definite findings for abscess or pancreatic necrosis.  Choledocholithiasis Evaluation and management as above  Leukocytosis  Improving. Likely secondary to abdominal process. Does not clinically look septic or toxic. Apart from large pseudocyst, no complicating features on CT abdomen. No indication for antibiotics at this time.  Hypoxic and hypercarbic respiratory failure Multifactorial secondary to opioids, benzodiazepines, atelectasis, +/- ALI. Resolved. Continue incentive spirometry. CTA chest on 01/07/13 negative for PE.  Hyperlipidemia On admission her total cholesterol was elevated at 225, LDL at 143. She will likely  benefit from starting a lipid-lowering agent when her acute pancreatitis has completely resolved.  Code Status: Full Family Communication: patient, sister in law Ms. Larita Fife at bedside Disposition Plan: Not medically ready for discharge.  Consultants:  Surgery  GI  Procedures:  laparoscopic cholecystectomy with IOC 1/16  Antibiotics:  Piptazo (stopped 1.14.14)  HPI/Subjective: Right upper quadrant abdominal pain, intermittent and moderate. Tolerating by mouth. No nausea or vomiting. Indicates that she slept well overnight.  Objective: Filed Vitals:   01/17/13 1101 01/17/13 1127 01/17/13 1158 01/17/13 1209  BP: 116/61  132/78 138/81  Pulse: 74  83 72  Temp: 97.7 F (36.5 C)     TempSrc:      Resp: 18  18 20   Height:      Weight:      SpO2: 93% 92% 90% 92%    Intake/Output Summary (Last 24 hours) at 01/17/13 1232 Last data filed at 01/17/13 0700  Gross per 24 hour  Intake 1763.5 ml  Output    795 ml  Net  968.5 ml   Filed Weights   01/05/13 0001 01/08/13 1214  Weight: 125.646 kg (277 lb) 138.7 kg (305 lb 12.5 oz)    Exam:  General:  NAD. Morbidly obese.  Cardiovascular: RRR, no MRG.  Respiratory: Slightly decreased breath sounds in bases but otherwise clear to auscultation. No increased work of breathing.  Abdomen: Nondistended. Surgical drain right upper quadrant-dressing clean, dry and intact. Mild tenderness in right upper and mid quadrant but without rigidity, rebound or guarding. Normal bowel sounds heard.  Skin: no erythema, no cellulitic changes over abdomen.  Extremities: Symmetric 5 x 5 power.  CNS: Alert and oriented. No focal neurological deficits.  Data Reviewed: Basic Metabolic Panel:  Lab 01/17/13 8119 01/16/13 0920 01/16/13 0600 01/15/13 0751 01/14/13 0540  NA 138 139 137 138 139  K 4.3 4.5 4.5 4.7 4.0  CL 101 99 98 99 98  CO2 30 30  30 32 32  GLUCOSE 98 109* 167* 119* 102*  BUN 8 11 11 9 9   CREATININE 0.60 0.54 0.62 0.57 0.53    CALCIUM 9.0 9.0 8.8 9.0 8.7  MG -- -- -- -- --  PHOS -- -- -- -- --   Liver Function Tests:  Lab 01/17/13 0555 01/16/13 0920 01/16/13 0600 01/15/13 0751 01/14/13 0540  AST 24 21 19 20  2421  ALT 25 26 24 25  3030  ALKPHOS 179* 160* 147* 145* 156*152*  BILITOT 0.3 0.2* 0.2* 0.2* 0.2*0.2*  PROT 5.9* 6.4 6.0 5.8* 5.8*5.6*  ALBUMIN 2.1* 2.3* 2.0* 2.0* 1.9*1.8*    Lab 01/17/13 0900 01/16/13 0920 01/15/13 1458  LIPASE 116* 107* 102*  AMYLASE -- 138* 132*   No results found for this basename: AMMONIA:5 in the last 168 hours CBC:  Lab 01/17/13 0555 01/16/13 0920 01/16/13 0600 01/15/13 0751 01/14/13 0540  WBC 13.6* 18.2* 15.5* 15.4* 15.3*  NEUTROABS -- -- -- -- --  HGB 12.2 13.0 12.3 11.9* 12.1  HCT 37.5 39.5 38.3 37.3 36.7  MCV 88.7 89.8 89.5 89.2 90.0  PLT 220 262 241 246 267   CBG:  Lab 01/17/13 0912  GLUCAP 96   Recent Results (from the past 240 hour(s))  MRSA PCR SCREENING     Status: Normal   Collection Time   01/08/13 12:16 PM      Component Value Range Status Comment   MRSA by PCR NEGATIVE  NEGATIVE Final    Studies CT abdomen on 01/15/13  IMPRESSION:  1. 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  2. No definite findings for abscess and no evidence of pancreatic necrosis.  3. New bilateral pleural effusions, left greater than right with overlying atelectasis.  4. Stable biliary dilatation.  5. Large abdominal wall hernia.  6. New subcutaneous edema could suggest anasarca or cellulitis.    Scheduled Meds:    . albuterol      . fentaNYL      . fentaNYL  100 mcg Intravenous Once  . flumazenil  0.2 mg Intravenous Once  . midazolam      . midazolam  2-4 mg Intravenous Once  . pantoprazole  40 mg Oral Q1200  . polyethylene glycol  17 g Oral Daily  . sodium glycerophosphate 0.9% NaCl IVPB  20 mmol Intravenous Once   Followed by  . sodium glycerophosphate 0.9% NaCl IVPB  10 mmol  Intravenous Once   Continuous Infusions:    . sodium chloride 20 mL/hr (01/17/13 1147)   Principal Problem:  *Gallstone pancreatitis Active Problems:  Common biliary duct obstruction  Leukocytosis  Acute respiratory failure: hypoxic and hypercarbic  Atelectasis  Pleural effusion  Hypotension  Choledocholithiasis   [facility]  [facility] Pager [phone]. If 7PM-7AM, please contact night-coverage at [email or web address], password [facility] 01/17/2013, 12:32 PM  LOS: 13 days

## 2013-01-17 NOTE — ED Notes (Signed)
Awaiting scanner to open up

## 2013-01-17 NOTE — Progress Notes (Signed)
Patient seen and examined.  Agree with PA's note. Better today.  Will remove drain and hope to get MRCP.

## 2013-01-17 NOTE — Progress Notes (Signed)
57yo female to begin Zosyn for continued abdominal pain.  Will give Zosyn 3.375g IV Q8H for CrCl >100 ml/min.  Vernard Gambles, PharmD, BCPS 01/17/2013 7:21 AM

## 2013-01-17 NOTE — ED Notes (Signed)
Sat 90% on room air after respiratory treatment 20 minutes ago.  Initially sat post treatment was 95, O2 used for nebulizer.

## 2013-01-17 NOTE — ED Notes (Signed)
No meds given 

## 2013-01-17 NOTE — Progress Notes (Signed)
Pt was brought to MRI to have abdomen scan under conscious sedation.  Because of pt's size we got permission to do a 'test fit' to make sure pt could fit in scanner before starting sedation.  Unfortunately at this test, the pt did not fit in the scanner, pt is too large for this magnet.  Pt was disconnected from monitors and sent to the floor.  Radiology RN notified pts floor RN.

## 2013-01-17 NOTE — Interval H&P Note (Cosign Needed)
History and Physical Interval Note: patient seen today for evaluation for moderate sedation for claustrophobia to assist with MRCP evaluation. CV: RRR without murmurs rubs or gallops - resp : bilateral rhonchi and wheezing - will get nebulizer treatment prior to sedation. Abdomen : soft, obese, TTP at RUQ.  Rest of exam and ROS unchanged.   01/17/2013 11:13 AM  [name] [name]  has presented today for surgery, with the diagnosis of persistent pain post cholecystectomy.  The various methods of treatment have been discussed with the patient and family. After consideration of risks, benefits and other options for treatment, the patient has consented to MRCP with moderate sedation secondary to claustrophobia for further pain evaluation.  The patient's history has been reviewed, patient examined, no change in status, stable for sedation.  I have reviewed the patient's chart and labs.  Questions were answered to the patient's satisfaction.     Trevar Boehringer D, PA-C Dr. Genevive Bi

## 2013-01-17 NOTE — H&P (View-Only) (Signed)
[facility] PROGRESS NOTE  EVANN KOELZER AVW:[id number] DOB: [birth year] DOA: 01/04/2013 PCP: Kaleen Mask, MD  Assessment/Plan: Acute pancreatitis secondary to gallstones - more abdominal pain this morning - CT scan showed   1. 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 2. No definite findings for abscess and no evidence of pancreatic necrosis. 3. New bilateral pleural effusions, left greater than right with overlying atelectasis. 4. Stable biliary dilatation. 5. Large abdominal wall hernia. 6. New subcutaneous edema could suggest anasarca or cellulitis.  - will try to obtain MRI/MRCP today. I would like to avoid, if possible conscious sedation given her event last week requiring stepdown/ICU transfer 2/2 respiratory decline. Will try benzodiazepines first.   2. Leukocytosis  - Her white count is up to 15 yesterday and stable today.  - CT mentions subcutaneous anasarca vs cellulitis. I cannot appreciate any cellulitic evidence on the skin today.  - will continue to closely monitor.  3. Hypoxic and hypercarbic respiratory failure - This appears resolved, she is currently on room air.  4. Abdominal pain - Likely secondary to acute pancreatitis we'll continue pain management.   5. Hyperlipidemia - On admission her total cholesterol was elevated at 225, LDL at 143 - She will likely benefit from starting a lipid-lowering agent on discharge  Code Status: Full Family Communication: patient, sister in law Disposition Plan: if CT scan negative and WBC gets better, d/c tomorrow.   Consultants:  Surgery  GI  Procedures:  none  Antibiotics:  Piptazo (stopped 1.14.14)  HPI/Subjective: In no acute distress, she seems to have less abdominal tenderness  Objective: Filed Vitals:   01/15/13 0752 01/15/13 1526 01/15/13 2106 01/16/13 0454  BP:  108/57 119/60 111/56  Pulse:  84 76 75  Temp:   98.6 F (37 C) 98.7 F (37.1 C) 98.2 F (36.8 C)  TempSrc:   Oral Oral  Resp:  18 22 20   Height:      Weight:      SpO2: 93% 93% 96% 96%    Intake/Output Summary (Last 24 hours) at 01/16/13 1258 Last data filed at 01/16/13 0400  Gross per 24 hour  Intake    480 ml  Output     90 ml  Net    390 ml   Filed Weights   01/05/13 0001 01/08/13 1214  Weight: 125.646 kg (277 lb) 138.7 kg (305 lb 12.5 oz)    Exam:  General:  NAD  Cardiovascular: RRR, no MRG, however exam limited due to the body habitus  Respiratory: coarse breath sounds, mostly at the bases  Abdomen: soft, diffusely tender to palpation mainly in the epigastric area. She's not as tender as yesterday. Post surgical drain in place.  Skin: no erythema, no cellulitic changes.   Data Reviewed: Basic Metabolic Panel:  Lab 01/16/13 8295 01/16/13 0600 01/15/13 0751 01/14/13 0540 01/13/13 0910 01/10/13 0500  NA 139 137 138 139 138 --  K 4.5 4.5 4.7 4.0 3.9 --  CL 99 98 99 98 98 --  CO2 30 30 32 32 31 --  GLUCOSE 109* 167* 119* 102* 106* --  BUN 11 11 9 9 10  --  CREATININE 0.54 0.62 0.57 0.53 0.45* --  CALCIUM 9.0 8.8 9.0 8.7 8.8 --  MG -- -- -- -- -- 2.2  PHOS -- -- -- -- -- 1.2*   Liver Function Tests:  Lab 01/16/13 0920 01/16/13 0600 01/15/13 0751 01/14/13 0540 01/13/13 0910  AST  21 19 20  2421 25  ALT 26 24 25  3030 36*  ALKPHOS 160* 147* 145* 156*152* 202*  BILITOT 0.2* 0.2* 0.2* 0.2*0.2* 0.3  PROT 6.4 6.0 5.8* 5.8*5.6* 5.8*  ALBUMIN 2.3* 2.0* 2.0* 1.9*1.8* 1.9*    Lab 01/16/13 0920 01/15/13 1458 01/10/13 0500  LIPASE 107* 102* 50  AMYLASE 138* 132* --   No results found for this basename: AMMONIA:5 in the last 168 hours CBC:  Lab 01/16/13 0920 01/16/13 0600 01/15/13 0751 01/14/13 0540 01/13/13 0910 01/10/13 0500  WBC 18.2* 15.5* 15.4* 15.3* 13.4* --  NEUTROABS -- -- -- -- -- 10.7*  HGB 13.0 12.3 11.9* 12.1 12.4 --  HCT 39.5 38.3 37.3 36.7 37.5 --  MCV 89.8 89.5 89.2 90.0 89.9 --  PLT  262 241 246 267 263 --   CBG: No results found for this basename: GLUCAP:5 in the last 168 hours Recent Results (from the past 240 hour(s))  MRSA PCR SCREENING     Status: Normal   Collection Time   01/08/13 12:16 PM      Component Value Range Status Comment   MRSA by PCR NEGATIVE  NEGATIVE Final    Scheduled Meds:    . LORazepam  0.5 mg Intravenous On Call  . pantoprazole  40 mg Oral Q1200  . polyethylene glycol  17 g Oral Daily   Continuous Infusions:    . sodium chloride 10 mL/hr (01/13/13 1039)   Principal Problem:  *Gallstone pancreatitis Active Problems:  Common biliary duct obstruction  Leukocytosis  Acute respiratory failure: hypoxic and hypercarbic  Atelectasis  Pleural effusion  Hypotension   [name]  [facility] Pager [phone]. If 7PM-7AM, please contact night-coverage at [email or web address], password [facility] 01/16/2013, 12:58 PM  LOS: 12 days

## 2013-01-17 NOTE — Progress Notes (Signed)
PARENTERAL NUTRITION CONSULT NOTE - INITIAL  Pharmacy Consult:  TPN Indication:  Severe and prolonged pancreatitis + new pseudocyst  Allergies  Allergen Reactions  . Ibuprofen Itching, Nausea And Vomiting and Swelling    Patient Measurements: Height: 5\' 5"  (165.1 cm) Weight: 305 lb 12.5 oz (138.7 kg) IBW/kg (Calculated) : 57  Adjusted BW = 82 kg  Vital Signs: Temp: 98.4 F (36.9 C) (01/22 0528) Temp src: Oral (01/22 0528) BP: 109/47 mmHg (01/22 0528) Pulse Rate: 72  (01/22 0528) Intake/Output from previous day: 01/21 0701 - 01/22 0700 In: 480 [P.O.:480] Out: 795 [Urine:725; Drains:70]  Labs:  [facility] 01/17/13 0555 01/16/13 0920 01/16/13 0600  WBC 13.6* 18.2* 15.5*  HGB 12.2 13.0 12.3  HCT 37.5 39.5 38.3  PLT 220 262 241  APTT -- -- --  INR -- -- --     Basename 01/16/13 0920 01/16/13 0600 01/15/13 0751  NA 139 137 138  K 4.5 4.5 4.7  CL 99 98 99  CO2 30 30 32  GLUCOSE 109* 167* 119*  BUN 11 11 9   CREATININE 0.54 0.62 0.57  LABCREA -- -- --  CREAT24HRUR -- -- --  CALCIUM 9.0 8.8 9.0  MG -- -- --  PHOS -- -- --  PROT 6.4 6.0 5.8*  ALBUMIN 2.3* 2.0* 2.0*  AST 21 19 20   ALT 26 24 25   ALKPHOS 160* 147* 145*  BILITOT 0.2* 0.2* 0.2*  BILIDIR -- <0.1 0.1  IBILI -- NOT CALCULATED 0.1*  PREALBUMIN -- -- --  TRIG -- -- --  CHOLHDL -- -- --  CHOL -- -- --   Estimated Creatinine Clearance: 113.8 ml/min (by C-G formula based on Cr of 0.54).   No results found for this basename: GLUCAP:3 in the last 72 hours      Insulin Requirements in the past 24 hours:  not yet on SSI  Current Nutrition:  NPO  Assessment: 57 YOF with gallbladder pancreatitis, s/p cholecystectomy on 01/11/13.  Patient continues to have generalized abdominal pain post surgery, found to have new large pseudocyst and to undergo MRCP possibly today.  Pharmacy consulted to manage TPN.  Concern with refeeding syndrome as PO intake has been minimal to none.   Admit: intermittent abd pain  x 4 days => N/V that worsens with PO intake GI: gallbladder pancreatitis, s/p cholecystectomy on 01/11/13, now with pseudocyst - on Protonix, Miralax Endo: no hx DM - serum glucose normal Lytes: Phos low at 1.2 on 1/15, others WNL.  Will not order STAT Phos b/c expect it to be low as no supplementation has been received. Renal: SCr normal and stable, CrCL 114 ml/min, I/O's inaccurate, NS at 10 ml/hr Pulm: hx asthma - stable on RA Cards: hx HLD - VSS Hepatobil: cholelithiasis with common bile duct dilatation - alk phos trending down overall, AST/ALT/tbili WNL, amylase/lipase remain elevated Neuro: no issues ID: Zosyn Rx#1 (1/22 >> ), empiric for abd process, afebrile, WBC trending down again to 13.6 Best Practices: SCDs TPN Access: not yet established - PICC line ordered TPN day#:  0 (1/22 > )   Nutritional Goals:  1600-1700 kCal, 100-115 grams of protein per day   Plan:  - After PICC placed, initiate Clinimix E 5/15 at 40 ml/hr + lipids at 10 ml/hr on MWF - Supplement lipid, multivitamin, and trace elements on MWF d/t national shortage - Start sensitive SSI Q6H with plan to d/c if CBGs remain controlled at goal TPN rate - Sodium phosphate IV x 1 - Standard  TPN labs in AM, monitor for electrolytes imbalances     [name] [name], PharmD, BCPS Pager:  [phone] 01/17/2013, 8:18 AM

## 2013-01-17 NOTE — Progress Notes (Signed)
6 Days Post-Op  Subjective: The patient feels much improved today after a good nights sleep.  She still c/o pain in the Right side of her abdomen.  Pt tolerating clears.  Pt had BM yesterday and passing flatus.   Objective: Vital signs in last 24 hours: Temp:  [98.2 F (36.8 C)-98.4 F (36.9 C)] 98.4 F (36.9 C) (01/22 0528) Pulse Rate:  [69-72] 72  (01/22 0528) Resp:  [16-20] 16  (01/22 0528) BP: (109-142)/(47-81) 109/47 mmHg (01/22 0528) SpO2:  [94 %-98 %] 94 % (01/22 0528) Last BM Date: 01/16/13  Intake/Output from previous day: 01/21 0701 - 01/22 0700 In: 2003.5 [P.O.:780; I.V.:1223.5] Out: 795 [Urine:725; Drains:70] Intake/Output this shift:    PE: Gen:  Alert, NAD, pleasant Abd: Soft, ND, mild tenderness in Right side of abdomen, +BS, large ventral hernia, incisions C/D/I, drain with minimal sanguinous drainage   Lab Results:   Basename 01/17/13 0555 01/16/13 0920  WBC 13.6* 18.2*  HGB 12.2 13.0  HCT 37.5 39.5  PLT 220 262   BMET  Basename 01/17/13 0555 01/16/13 0920  NA 138 139  K 4.3 4.5  CL 101 99  CO2 30 30  GLUCOSE 98 109*  BUN 8 11  CREATININE 0.60 0.54  CALCIUM 9.0 9.0   PT/INR No results found for this basename: LABPROT:2,INR:2 in the last 72 hours CMP     Component Value Date/Time   NA 138 01/17/2013 0555   K 4.3 01/17/2013 0555   CL 101 01/17/2013 0555   CO2 30 01/17/2013 0555   GLUCOSE 98 01/17/2013 0555   BUN 8 01/17/2013 0555   CREATININE 0.60 01/17/2013 0555   CALCIUM 9.0 01/17/2013 0555   PROT 5.9* 01/17/2013 0555   ALBUMIN 2.1* 01/17/2013 0555   AST 24 01/17/2013 0555   ALT 25 01/17/2013 0555   ALKPHOS 179* 01/17/2013 0555   BILITOT 0.3 01/17/2013 0555   GFRNONAA >90 01/17/2013 0555   GFRAA >90 01/17/2013 0555   Lipase     Component Value Date/Time   LIPASE 107* 01/16/2013 0920       Studies/Results: Ct Abdomen Pelvis W Contrast  01/15/2013  *RADIOLOGY REPORT*  Clinical Data: Acute pancreatitis.  CT ABDOMEN AND PELVIS WITH CONTRAST   Technique:  Multidetector CT imaging of the abdomen and pelvis was performed following the standard protocol during bolus administration of intravenous contrast.  Contrast: OMNIPAQUE IOHEXOL 300 MG/ML  SOLN  Comparison: 01/05/2013.  Findings: The lung bases demonstrate bilateral pleural effusions, left greater than right with overlying atelectasis.  These are new.  The liver demonstrates stable intrahepatic biliary dilatation.  No focal hepatic lesions.  The gallbladder is surgically absent. Stable common bile duct dilatation.  The spleen is mildly enlarged but but no focal lesions.  There is a large complex fluid collection surrounding the spleen with a thin enhancing membrane. This is most likely a pseudocyst.  There are also multiple complex pseudocyst surrounding the pancreas and in the splenic hilum and continuing down into the small bowel mesentery.  The pancreas demonstrates normal enhancement.  No findings to suggest pancreatic necrosis.  No hemorrhage or pseudoaneurysm.  The pancreatic duct is normal in caliber.  The adrenal glands and kidneys are unremarkable.  The stomach, duodenum, small bowel and colon grossly normal.  No findings for small bowel obstruction.  There is a large abdominal wall hernia containing small bowel and colon but no findings for obstruction/ incarceration.  There is extensive subcutaneous soft tissue swelling/edema which could suggest anasarca or  cellulitis.  The bladder is unremarkable.  No pelvic mass or significant free pelvic fluid collection.  Surgical changes involving the lumbar spine are again demonstrated.  IMPRESSION:  1.  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 2.  No definite findings for abscess and no evidence of pancreatic necrosis. 3.  New bilateral pleural effusions, left greater than right with overlying atelectasis. 4.  Stable biliary dilatation. 5.  Large abdominal wall hernia.  6.  New subcutaneous edema could suggest anasarca or cellulitis.   Original Report Authenticated By: Rudie Meyer, M.D.     Anti-infectives: Anti-infectives     Start     Dose/Rate Route Frequency Ordered Stop   01/17/13 1400  piperacillin-tazobactam (ZOSYN) IVPB 3.375 g       3.375 g 12.5 mL/hr over 240 Minutes Intravenous Every 8 hours 01/17/13 0720     01/17/13 0730  piperacillin-tazobactam (ZOSYN) IVPB 3.375 g       3.375 g 100 mL/hr over 30 Minutes Intravenous  Once 01/17/13 0720     01/06/13 1100   piperacillin-tazobactam (ZOSYN) IVPB 3.375 g  Status:  Discontinued        3.375 g 12.5 mL/hr over 240 Minutes Intravenous 3 times per day 01/06/13 1045 01/09/13 0930           Assessment/Plan 1. Gallstone pancreatitis, s/p lap chole with IOC 1/16-CT demonstrates peripancreatic fluid collections suggesting pseudocyst formation; amylase and lipase elevated yesterday pending repeat lab today 2. Choledocholithiasis suggested on IOC-  3. Leukocytosis-likely secondary to continuing pancreatitis, improved today  Discussed with new medicine attending this AM about sending the patient down for MRCP with conscious sedation today, which she will agree to.  Will order for conscious sedation (fentanyl and versed) to be administered.  MRI tech says they will have to check when she's down there to see if she will fit in the machine.    If we are not able to accomplish the MRI here (due to size) in the hospital I would ask GI to reconsider doing an ERCP for the patient.  If not, the patient would like to go home and have it done as an outpatient when medically stable.      LOS: 13 days    DORT, Aundra Millet 01/17/2013, 8:19 AM Pager: [phone]

## 2013-01-17 NOTE — ED Notes (Signed)
O2 increased to 5 L while lying flat on MRI scanner.  Attempting test fit to see if pt will fit in scanner.

## 2013-01-18 LAB — COMPREHENSIVE METABOLIC PANEL
ALT: 23 U/L (ref 0–35)
AST: 19 U/L (ref 0–37)
CO2: 26 mEq/L (ref 19–32)
Calcium: 9.1 mg/dL (ref 8.4–10.5)
GFR calc non Af Amer: >90 mL/min (ref >90)
Potassium: 3.9 mEq/L (ref 3.5–5.1)
Sodium: 138 mEq/L (ref 135–145)

## 2013-01-18 LAB — CBC WITH DIFFERENTIAL/PLATELET
Basophils Absolute: 0 10*3/uL (ref 0.0–0.1)
Eosinophils Relative: 2 % (ref 0–5)
Lymphocytes Relative: 13 % (ref 12–46)
MCV: 88.2 fL (ref 78.0–100.0)
Neutro Abs: 9.8 10*3/uL — ABNORMAL HIGH (ref 1.7–7.7)
Neutrophils Relative %: 77 % (ref 43–77)
Platelets: 237 10*3/uL (ref 150–400)
RDW: 13.7 % (ref 11.5–15.5)
WBC: 12.8 10*3/uL — ABNORMAL HIGH (ref 4.0–10.5)

## 2013-01-18 LAB — MAGNESIUM: Magnesium: 2.3 mg/dL (ref 1.5–2.5)

## 2013-01-18 LAB — PHOSPHORUS: Phosphorus: 4.6 mg/dL (ref 2.3–4.6)

## 2013-01-18 MED ORDER — ENSURE COMPLETE PO LIQD
237.0000 mL | Freq: Two times a day (BID) | ORAL | Status: DC
  Administered 2013-01-18 – 2013-01-20 (×5): 237 mL via ORAL

## 2013-01-18 MED ORDER — ENSURE COMPLETE PO LIQD: 237.0000 mL | Freq: Three times a day (TID) | ORAL | Status: DC

## 2013-01-18 NOTE — Progress Notes (Signed)
7 Days Post-Op  Subjective: Pt "feels like a new woman" since her drain was pulled.  Pt denies pain, is hungry/thirsty.  Ambulating through the halls well.  Urinating and had a BM yesterday, +flatus.  Pt thought she may go home today, very upset/emotional about having to stay.  Up to 2,000 on IS.  Objective: Vital signs in last 24 hours: Temp:  [97.7 F (36.5 C)-98.6 F (37 C)] 98.6 F (37 C) (01/23 0746) Pulse Rate:  [57-83] 80  (01/23 0746) Resp:  [18-20] 18  (01/23 0746) BP: (116-155)/(61-81) 143/73 mmHg (01/23 0746) SpO2:  [90 %-95 %] 94 % (01/23 0746) Last BM Date: 01/17/13  Intake/Output from previous day: 01/22 0701 - 01/23 0700 In: 671.2 [I.V.:411.2; IV Piggyback:260] Out: 3050 [Urine:3050] Intake/Output this shift:    PE: Gen:  Alert, NAD, pleasant Abd: Soft, NT/ND, +BS, no HSM, incisions C/D/I   Lab Results:   Basename 01/18/13 0535 01/17/13 0555  WBC 12.8* 13.6*  HGB 12.9 12.2  HCT 39.0 37.5  PLT 237 220   BMET  Basename 01/18/13 0535 01/17/13 0555  NA 138 138  K 3.9 4.3  CL 99 101  CO2 26 30  GLUCOSE 96 98  BUN 8 8  CREATININE 0.54 0.60  CALCIUM 9.1 9.0   PT/INR No results found for this basename: LABPROT:2,INR:2 in the last 72 hours CMP     Component Value Date/Time   NA 138 01/18/2013 0535   K 3.9 01/18/2013 0535   CL 99 01/18/2013 0535   CO2 26 01/18/2013 0535   GLUCOSE 96 01/18/2013 0535   BUN 8 01/18/2013 0535   CREATININE 0.54 01/18/2013 0535   CALCIUM 9.1 01/18/2013 0535   PROT 6.4 01/18/2013 0535   ALBUMIN 2.4* 01/18/2013 0535   AST 19 01/18/2013 0535   ALT 23 01/18/2013 0535   ALKPHOS 159* 01/18/2013 0535   BILITOT 0.3 01/18/2013 0535   GFRNONAA >90 01/18/2013 0535   GFRAA >90 01/18/2013 0535   Lipase     Component Value Date/Time   LIPASE 116* 01/18/2013 0535       Studies/Results: No results found.  Anti-infectives: Anti-infectives     Start     Dose/Rate Route Frequency Ordered Stop   01/17/13 1400   piperacillin-tazobactam  (ZOSYN) IVPB 3.375 g  Status:  Discontinued        3.375 g 12.5 mL/hr over 240 Minutes Intravenous Every 8 hours 01/17/13 0720 01/17/13 1232   01/17/13 0730  piperacillin-tazobactam (ZOSYN) IVPB 3.375 g       3.375 g 100 mL/hr over 30 Minutes Intravenous  Once 01/17/13 0720 01/17/13 0927   01/06/13 1100   piperacillin-tazobactam (ZOSYN) IVPB 3.375 g  Status:  Discontinued        3.375 g 12.5 mL/hr over 240 Minutes Intravenous 3 times per day 01/06/13 1045 01/09/13 0930           Assessment/Plan 1. Gallstone pancreatitis, s/p lap chole with IOC 1/16-CT demonstrates peripancreatic fluid collections suggesting pseudocyst formation;  lipase still elevated  2. Choledocholithiasis suggested on IOC 3. Leukocytosis-likely secondary to continuing pancreatitis, improved today to 12.8  Plan: 1.  The patient was not able to go down in the MRI machine yesterday.  Dr. Abbey Chatters discussed with Dr. Madilyn Fireman about doing a endoscopic ultrasound procedure today.  Pending those final recommendations.   2.  Will advance diet to fulls today with supplements.   3.  Ordered dietitian consult to ensure patient is educated about a low fat diet. Disp:  Plan for d/c over the next few days as long as pancreatitis is resolving and tolerating low fat diet        LOS: 14 days    DORT, Bonita Brindisi 01/18/2013, 8:27 AM Pager: [phone]

## 2013-01-18 NOTE — Progress Notes (Signed)
[facility] PROGRESS NOTE  [name] [name] ZOX:[id number] DOB: February 04, 1956 DOA: 01/04/2013 PCP: Kaleen Mask, MD  Brief narrative 57 year old female patient with history of arthritis, asthma and neuromuscular disorder was admitted on 01/04/13 with history of abdominal pain, nausea, vomiting, subjective fever and chills. Evaluation in the ED was consistent with acute biliary pancreatitis. GI and surgery consulted. She was initially treated supportively with hydration, n.p.o. status and analgesics. She developed acute hypoxic respiratory failure on 1/13 secondary to narcotics, benzodiazepines, bilateral atelectasis and +/-ALI. She was transferred to ICU with critical care consultation. She did not require intubation and improved with supportive treatment. She is S/P laparoscopic cholecystectomy with IOC 1/16. Choledocholithiasis suggested an IOC-being evaluated. She was transferred to regular floor.  Assessment/Plan  Acute pancreatitis secondary to gallstones S/P laparoscopic cholecystectomy with IOC 1/16. Choledocholithiasis suggested on IOC. Unable to fit in machine to do MRCP. EUS on hold. GI and surgery are following. CT abdomen from 1/20 shows complex pseudocyst surrounding the pancreas and spleen and extending down into the small bowel recently. No definite findings for abscess or pancreatic necrosis. Plan start to advance diet and see how she responds. If no worsening symptoms and amylase and lipase don't increase, possible DC home on 1/25. Otherwise, n.p.o., bowel rest and TPN.  Choledocholithiasis Evaluation and management as above. May consider outpatient EUS  Leukocytosis  Improving. Likely secondary to abdominal process. Does not clinically look septic or toxic. Apart from large pseudocyst, no complicating features on CT abdomen. No indication for antibiotics at this time.  Hypoxic and hypercarbic respiratory failure Multifactorial secondary to opioids, benzodiazepines,  atelectasis, +/- ALI. Resolved. Continue incentive spirometry. CTA chest on 01/07/13 negative for PE.  Hyperlipidemia On admission her total cholesterol was elevated at 225, LDL at 143. She will likely benefit from starting a lipid-lowering agent when her acute pancreatitis has completely resolved.  Code Status: Full Family Communication: patient, sister in law Ms. Larita Fife at bedside Disposition Plan: Not medically ready for discharge.  Consultants:  Surgery  GI  Procedures:  laparoscopic cholecystectomy with IOC 1/16  Antibiotics:  Piptazo (stopped 1.14.14)  HPI/Subjective: Denies any further abdominal pain. Using IS.  Objective: Filed Vitals:   01/17/13 2202 01/18/13 0613 01/18/13 0746 01/18/13 1015  BP: 155/64 125/64 143/73 143/68  Pulse: 77 57 80 87  Temp: 98.4 F (36.9 C) 98.2 F (36.8 C) 98.6 F (37 C) 98.9 F (37.2 C)  TempSrc: Oral Oral Oral Oral  Resp: 18 18 18    Height:      Weight:      SpO2: 93% 93% 94% 94%    Intake/Output Summary (Last 24 hours) at 01/18/13 1138 Last data filed at 01/18/13 1020  Gross per 24 hour  Intake 1151.17 ml  Output   3350 ml  Net -2198.83 ml   Filed Weights   01/05/13 0001 01/08/13 1214  Weight: 125.646 kg (277 lb) 138.7 kg (305 lb 12.5 oz)    Exam:  General:  NAD. Morbidly obese.  Cardiovascular: RRR, no MRG.  Respiratory: Slightly decreased breath sounds in bases but otherwise clear to auscultation. No increased work of breathing.  Abdomen: Nondistended. Soft and nontender. Normal bowel sounds heard.  Extremities: Symmetric 5 x 5 power.  CNS: Alert and oriented. No focal neurological deficits.  Data Reviewed: Basic Metabolic Panel:  Lab 01/18/13 8119 01/17/13 0555 01/16/13 0920 01/16/13 0600 01/15/13 0751  NA 138 138 139 137 138  K 3.9 4.3 4.5 4.5 4.7  CL 99 101 99 98 99  CO2  26 30 30 30  32  GLUCOSE 96 98 109* 167* 119*  BUN 8 8 11 11 9   CREATININE 0.54 0.60 0.54 0.62 0.57  CALCIUM 9.1 9.0 9.0 8.8 9.0   MG 2.3 -- -- -- --  PHOS 4.6 -- -- -- --   Liver Function Tests:  Lab 01/18/13 0535 01/17/13 0555 01/16/13 0920 01/16/13 0600 01/15/13 0751  AST 19 24 21 19 20   ALT 23 25 26 24 25   ALKPHOS 159* 179* 160* 147* 145*  BILITOT 0.3 0.3 0.2* 0.2* 0.2*  PROT 6.4 5.9* 6.4 6.0 5.8*  ALBUMIN 2.4* 2.1* 2.3* 2.0* 2.0*    Lab 01/18/13 0535 01/17/13 0900 01/16/13 0920 01/15/13 1458  LIPASE 116* 116* 107* 102*  AMYLASE -- 161* 138* 132*   No results found for this basename: AMMONIA:5 in the last 168 hours CBC:  Lab 01/18/13 0535 01/17/13 0555 01/16/13 0920 01/16/13 0600 01/15/13 0751  WBC 12.8* 13.6* 18.2* 15.5* 15.4*  NEUTROABS 9.8* -- -- -- --  HGB 12.9 12.2 13.0 12.3 11.9*  HCT 39.0 37.5 39.5 38.3 37.3  MCV 88.2 88.7 89.8 89.5 89.2  PLT 237 220 262 241 246   CBG:  Lab 01/17/13 0912  GLUCAP 96   Recent Results (from the past 240 hour(s))  MRSA PCR SCREENING     Status: Normal   Collection Time   01/08/13 12:16 PM      Component Value Range Status Comment   MRSA by PCR NEGATIVE  NEGATIVE Final    Studies CT abdomen on 01/15/13  IMPRESSION:  1. 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  2. No definite findings for abscess and no evidence of pancreatic necrosis.  3. New bilateral pleural effusions, left greater than right with overlying atelectasis.  4. Stable biliary dilatation.  5. Large abdominal wall hernia.  6. New subcutaneous edema could suggest anasarca or cellulitis.    Scheduled Meds:    . feeding supplement  237 mL Oral BID BM  . fentaNYL  100 mcg Intravenous Once  . flumazenil  0.2 mg Intravenous Once  . midazolam  2-4 mg Intravenous Once  . pantoprazole  40 mg Oral Q1200  . polyethylene glycol  17 g Oral Daily   Continuous Infusions:    . sodium chloride 20 mL/hr (01/17/13 1147)   Principal Problem:  *Gallstone pancreatitis Active Problems:  Common biliary duct obstruction   Leukocytosis  Acute respiratory failure: hypoxic and hypercarbic  Atelectasis  Pleural effusion  Hypotension  Choledocholithiasis   [facility]  [facility] Pager [phone]. If 7PM-7AM, please contact night-coverage at [email or web address], password [facility] 01/18/2013, 11:38 AM  LOS: 14 days

## 2013-01-18 NOTE — Progress Notes (Signed)
Will hold on EUS for now.  Will advance diet and see how she responds.  If amylase and lipase increase, will need to start TPN and bowel rest.

## 2013-01-18 NOTE — Progress Notes (Signed)
Nutrition Education Note  RD consulted for nutrition education regarding a low fat diet.  RD provided "Fat Restricted Nutrition Therapy" handout from the Academy of [employer].  Patient reports she already follows this diet.  Reviewed guidelines and recommendations.   Expect good compliance.  Body mass index is 50.88 kg/(m^2). Pt meets criteria for Obesity Class III based on current BMI.  Current diet order is Full Liquids.  Reports her appetite is better and she feels hungry.  Labs and medications reviewed.    No further nutrition interventions warranted at this time. Please re-consult RD as needed.  Maureen Chatters, RD, LDN Pager #: [phone] After-Hours Pager #: [phone]

## 2013-01-18 NOTE — Progress Notes (Signed)
[facility] Progress Note  Subjective: Patient states that her pain is much better after taking her JP drain out and has not had aspirin a pain medicine since.  Objective: Vital signs in last 24 hours: Temp:  [97.7 F (36.5 C)-98.6 F (37 C)] 98.6 F (37 C) (01/23 0746) Pulse Rate:  [57-83] 80  (01/23 0746) Resp:  [18-20] 18  (01/23 0746) BP: (116-155)/(61-81) 143/73 mmHg (01/23 0746) SpO2:  [90 %-95 %] 94 % (01/23 0746) Weight change:    PE: No change  Lab Results: Results for orders placed during the hospital encounter of 01/04/13 (from the past 24 hour(s))  LIPASE, BLOOD     Status: Abnormal   Collection Time   01/17/13  9:00 AM      Component Value Range   Lipase 116 (*) 11 - 59 U/L  AMYLASE     Status: Abnormal   Collection Time   01/17/13  9:00 AM      Component Value Range   Amylase 161 (*) 0 - 105 U/L  GLUCOSE, CAPILLARY     Status: Normal   Collection Time   01/17/13  9:12 AM      Component Value Range   Glucose-Capillary 96  70 - 99 mg/dL  MAGNESIUM     Status: Normal   Collection Time   01/18/13  5:35 AM      Component Value Range   Magnesium 2.3  1.5 - 2.5 mg/dL  PHOSPHORUS     Status: Normal   Collection Time   01/18/13  5:35 AM      Component Value Range   Phosphorus 4.6  2.3 - 4.6 mg/dL  CBC WITH DIFFERENTIAL     Status: Abnormal   Collection Time   01/18/13  5:35 AM      Component Value Range   WBC 12.8 (*) 4.0 - 10.5 K/uL   RBC 4.42  3.87 - 5.11 MIL/uL   Hemoglobin 12.9  12.0 - 15.0 g/dL   HCT 81.1  91.4 - 78.2 %   MCV 88.2  78.0 - 100.0 fL   MCH 29.2  26.0 - 34.0 pg   MCHC 33.1  30.0 - 36.0 g/dL   RDW 95.6  21.3 - 08.6 %   Platelets 237  150 - 400 K/uL   Neutrophils Relative 77  43 - 77 %   Neutro Abs 9.8 (*) 1.7 - 7.7 K/uL   Lymphocytes Relative 13  12 - 46 %   Lymphs Abs 1.7  0.7 - 4.0 K/uL   Monocytes Relative 8  3 - 12 %   Monocytes Absolute 1.0  0.1 - 1.0 K/uL   Eosinophils Relative 2  0 - 5 %   Eosinophils Absolute 0.3   0.0 - 0.7 K/uL   Basophils Relative 0  0 - 1 %   Basophils Absolute 0.0  0.0 - 0.1 K/uL  COMPREHENSIVE METABOLIC PANEL     Status: Abnormal   Collection Time   01/18/13  5:35 AM      Component Value Range   Sodium 138  135 - 145 mEq/L   Potassium 3.9  3.5 - 5.1 mEq/L   Chloride 99  96 - 112 mEq/L   CO2 26  19 - 32 mEq/L   Glucose, Bld 96  70 - 99 mg/dL   BUN 8  6 - 23 mg/dL   Creatinine, Ser 5.78  0.50 - 1.10 mg/dL   Calcium 9.1  8.4 - 46.9 mg/dL   Total  Protein 6.4  6.0 - 8.3 g/dL   Albumin 2.4 (*) 3.5 - 5.2 g/dL   AST 19  0 - 37 U/L   ALT 23  0 - 35 U/L   Alkaline Phosphatase 159 (*) 39 - 117 U/L   Total Bilirubin 0.3  0.3 - 1.2 mg/dL   GFR calc non Af Amer >90  >90 mL/min   GFR calc Af Amer >90  >90 mL/min  LIPASE, BLOOD     Status: Abnormal   Collection Time   01/18/13  5:35 AM      Component Value Range   Lipase 116 (*) 11 - 59 U/L    Studies/Results: No results found.    Assessment: Pancreatitis status post cholecystectomy currently doing better after JP drain removed.  Plan: Try advancing diet as tolerated hopefully go home tomorrow. Please call us if further GI input needed.    Daksh Coates C 01/18/2013, 8:52 AM

## 2013-01-19 LAB — COMPREHENSIVE METABOLIC PANEL
CO2: 31 mEq/L (ref 19–32)
Calcium: 9.5 mg/dL (ref 8.4–10.5)
Creatinine, Ser: 0.67 mg/dL (ref 0.50–1.10)
GFR calc Af Amer: >90 mL/min (ref >90)
GFR calc non Af Amer: >90 mL/min (ref >90)
Glucose, Bld: 104 mg/dL — ABNORMAL HIGH (ref 70–99)

## 2013-01-19 LAB — CBC
Hemoglobin: 13.2 g/dL (ref 12.0–15.0)
MCH: 29.4 pg (ref 26.0–34.0)
MCV: 88.9 fL (ref 78.0–100.0)
Platelets: 258 10*3/uL (ref 150–400)
RBC: 4.49 MIL/uL (ref 3.87–5.11)

## 2013-01-19 LAB — AMYLASE: Amylase: 158 U/L — ABNORMAL HIGH (ref 0–105)

## 2013-01-19 NOTE — Progress Notes (Signed)
[facility] PROGRESS NOTE  [name] [name] ZOX:[id number] DOB: 03/01/1956 DOA: 01/04/2013 PCP: Kaleen Mask, MD  Brief narrative 57 year old female patient with history of arthritis, asthma and neuromuscular disorder was admitted on 01/04/13 with history of abdominal pain, nausea, vomiting, subjective fever and chills. Evaluation in the ED was consistent with acute biliary pancreatitis. GI and surgery consulted. She was initially treated supportively with hydration, n.p.o. status and analgesics. She developed acute hypoxic respiratory failure on 1/13 secondary to narcotics, benzodiazepines, bilateral atelectasis and +/-ALI. She was transferred to ICU with critical care consultation. She did not require intubation and improved with supportive treatment. She is S/P laparoscopic cholecystectomy with IOC 1/16. Choledocholithiasis suggested an IOC-being evaluated. She was transferred to regular floor.  Assessment/Plan  Acute pancreatitis secondary to gallstones S/P laparoscopic cholecystectomy with IOC 1/16. Choledocholithiasis suggested on IOC. Unable to fit in machine to do MRCP. EUS on hold. GI (signed off) and surgery are following. CT abdomen from 1/20 shows complex pseudocyst surrounding the pancreas and spleen and extending down into the small bowel recently. No definite findings for abscess or pancreatic necrosis. Tolerating full liquid diet without worsening abdominal pain. Advancing to low-fat diet today. If no worsening symptoms and amylase and lipase don't increase, possible DC home on 1/25. Otherwise, n.p.o., bowel rest and TPN. Amylase unchanged and lipase minimally elevated from before.  Choledocholithiasis Evaluation and management as above. May consider outpatient EUS  Leukocytosis  Resolved. Likely secondary to abdominal process. Does not clinically look septic or toxic. Apart from large pseudocyst, no complicating features on CT abdomen. No indication for antibiotics at this  time.  Hypoxic and hypercarbic respiratory failure Multifactorial secondary to opioids, benzodiazepines, atelectasis, +/- ALI. Resolved. Continue incentive spirometry. CTA chest on 01/07/13 negative for PE.  Hyperlipidemia On admission her total cholesterol was elevated at 225, LDL at 143. She will likely benefit from starting a lipid-lowering agent when her acute pancreatitis has completely resolved.  Code Status: Full Family Communication: Discussed with patient Disposition Plan: Not medically ready for discharge.  Consultants:  Surgery  GI  Procedures:  laparoscopic cholecystectomy with IOC 1/16  Antibiotics:  Piptazo (stopped 1.14.14)  HPI/Subjective: Complains of flanks being sore. No abdominal pain after eating full liquids. Ambulating halls comfortably  Objective: Filed Vitals:   01/18/13 1300 01/18/13 2122 01/19/13 0505 01/19/13 1406  BP: 131/71 122/69 126/61 125/56  Pulse: 92 75 67   Temp: 99.1 F (37.3 C) 98.1 F (36.7 C) 98.2 F (36.8 C) 97.9 F (36.6 C)  TempSrc: Oral   Oral  Resp: 16 20 17 18   Height:      Weight:      SpO2:  93% 95% 95%    Intake/Output Summary (Last 24 hours) at 01/19/13 1416 Last data filed at 01/19/13 0525  Gross per 24 hour  Intake 952.17 ml  Output   1550 ml  Net -597.83 ml   Filed Weights   01/05/13 0001 01/08/13 1214  Weight: 125.646 kg (277 lb) 138.7 kg (305 lb 12.5 oz)    Exam:  General:  NAD. Morbidly obese.  Cardiovascular: RRR, no MRG.  Respiratory: Slightly decreased breath sounds in bases but otherwise clear to auscultation. No increased work of breathing.  Abdomen: Nondistended. Soft and nontender. Normal bowel sounds heard.  Extremities: Symmetric 5 x 5 power.  CNS: Alert and oriented. No focal neurological deficits.  Data Reviewed: Basic Metabolic Panel:  Lab 01/19/13 8119 01/18/13 0535 01/17/13 0555 01/16/13 0920 01/16/13 0600  NA 139 138 138 139 137  K 4.1 3.9 4.3 4.5 4.5  CL 99 99 101 99 98   CO2 31 26 30 30 30   GLUCOSE 104* 96 98 109* 167*  BUN 9 8 8 11 11   CREATININE 0.67 0.54 0.60 0.54 0.62  CALCIUM 9.5 9.1 9.0 9.0 8.8  MG -- 2.3 -- -- --  PHOS -- 4.6 -- -- --   Liver Function Tests:  Lab 01/19/13 0728 01/18/13 0535 01/17/13 0555 01/16/13 0920 01/16/13 0600  AST 17 19 24 21 19   ALT 19 23 25 26 24   ALKPHOS 143* 159* 179* 160* 147*  BILITOT 0.2* 0.3 0.3 0.2* 0.2*  PROT 6.7 6.4 5.9* 6.4 6.0  ALBUMIN 2.6* 2.4* 2.1* 2.3* 2.0*    Lab 01/19/13 0728 01/18/13 0535 01/17/13 0900 01/16/13 0920 01/15/13 1458  LIPASE 133* 116* 116* 107* 102*  AMYLASE 158* -- 161* 138* 132*   No results found for this basename: AMMONIA:5 in the last 168 hours CBC:  Lab 01/19/13 0728 01/18/13 0535 01/17/13 0555 01/16/13 0920 01/16/13 0600  WBC 10.4 12.8* 13.6* 18.2* 15.5*  NEUTROABS -- 9.8* -- -- --  HGB 13.2 12.9 12.2 13.0 12.3  HCT 39.9 39.0 37.5 39.5 38.3  MCV 88.9 88.2 88.7 89.8 89.5  PLT 258 237 220 262 241   CBG:  Lab 01/17/13 0912  GLUCAP 96   No results found for this or any previous visit (from the past 240 hour(s)). Studies CT abdomen on 01/15/13  IMPRESSION:  1. 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  2. No definite findings for abscess and no evidence of pancreatic necrosis.  3. New bilateral pleural effusions, left greater than right with overlying atelectasis.  4. Stable biliary dilatation.  5. Large abdominal wall hernia.  6. New subcutaneous edema could suggest anasarca or cellulitis.    Scheduled Meds:    . feeding supplement  237 mL Oral BID BM  . pantoprazole  40 mg Oral Q1200  . polyethylene glycol  17 g Oral Daily   Continuous Infusions:    . sodium chloride 10 mL/hr at 01/19/13 0106   Principal Problem:  *Gallstone pancreatitis Active Problems:  Common biliary duct obstruction  Leukocytosis  Acute respiratory failure: hypoxic and hypercarbic  Atelectasis  Pleural  effusion  Hypotension  Choledocholithiasis   [facility]  [facility] Pager [phone]. If 7PM-7AM, please contact night-coverage at [email or web address], password [facility] 01/19/2013, 2:16 PM  LOS: 15 days

## 2013-01-19 NOTE — Progress Notes (Signed)
8 Days Post-Op  Subjective: No epigastric pain.  Some muscular soreness in flank area.  Objective: Vital signs in last 24 hours: Temp:  [98.1 F (36.7 C)-99.1 F (37.3 C)] 98.2 F (36.8 C) (01/24 0505) Pulse Rate:  [67-92] 67  (01/24 0505) Resp:  [16-20] 17  (01/24 0505) BP: (122-143)/(61-71) 126/61 mmHg (01/24 0505) SpO2:  [93 %-95 %] 95 % (01/24 0505) Last BM Date: 01/18/13  Intake/Output from previous day: 01/23 0701 - 01/24 0700 In: 1945.2 [P.O.:1520; I.V.:425.2] Out: 1850 [Urine:1850] Intake/Output this shift:    PE: General- In NAD Abdomen-soft, mild epigastric tenderness, large lower ventral hernia, incisions clean and intact  Lab Results:   Basename 01/19/13 0728 01/18/13 0535  WBC 10.4 12.8*  HGB 13.2 12.9  HCT 39.9 39.0  PLT 258 237   BMET  Basename 01/19/13 0728 01/18/13 0535  NA 139 138  K 4.1 3.9  CL 99 99  CO2 31 26  GLUCOSE 104* 96  BUN 9 8  CREATININE 0.67 0.54  CALCIUM 9.5 9.1   PT/INR No results found for this basename: LABPROT:2,INR:2 in the last 72 hours Comprehensive Metabolic Panel:    Component Value Date/Time   NA 139 01/19/2013 0728   K 4.1 01/19/2013 0728   CL 99 01/19/2013 0728   CO2 31 01/19/2013 0728   BUN 9 01/19/2013 0728   CREATININE 0.67 01/19/2013 0728   GLUCOSE 104* 01/19/2013 0728   CALCIUM 9.5 01/19/2013 0728   AST 17 01/19/2013 0728   ALT 19 01/19/2013 0728   ALKPHOS 143* 01/19/2013 0728   BILITOT 0.2* 01/19/2013 0728   PROT 6.7 01/19/2013 0728   ALBUMIN 2.6* 01/19/2013 0728     Studies/Results: No results found.  Anti-infectives: Anti-infectives     Start     Dose/Rate Route Frequency Ordered Stop   01/17/13 1400   piperacillin-tazobactam (ZOSYN) IVPB 3.375 g  Status:  Discontinued        3.375 g 12.5 mL/hr over 240 Minutes Intravenous Every 8 hours 01/17/13 0720 01/17/13 1232   01/17/13 0730  piperacillin-tazobactam (ZOSYN) IVPB 3.375 g       3.375 g 100 mL/hr over 30 Minutes Intravenous  Once 01/17/13 0720  01/17/13 0927   01/06/13 1100   piperacillin-tazobactam (ZOSYN) IVPB 3.375 g  Status:  Discontinued        3.375 g 12.5 mL/hr over 240 Minutes Intravenous 3 times per day 01/06/13 1045 01/09/13 0930          Assessment Principal Problem:  *Gallstone pancreatitis-on full liquids; amylase down, lipase up slightly; may have an envolving pseudocyst Active Problems:  Leukocytosis-resolved   ?Choledocholithiasis    LOS: 15 days   Plan: Try lowfat diet.  If she has more pain and increase in pancreatic enzymes, would need TPN and bowel rest.  If she does well with diet, could be discharged tomorrow on a lowfat diet and follow up with Dr. Carolynne Edouard in the office.   Ronny Korff J 01/19/2013

## 2013-01-20 LAB — COMPREHENSIVE METABOLIC PANEL
Alkaline Phosphatase: 121 U/L — ABNORMAL HIGH (ref 39–117)
BUN: 13 mg/dL (ref 6–23)
Creatinine, Ser: 0.63 mg/dL (ref 0.50–1.10)
GFR calc Af Amer: >90 mL/min (ref >90)
Glucose, Bld: 103 mg/dL — ABNORMAL HIGH (ref 70–99)
Potassium: 4.6 mEq/L (ref 3.5–5.1)
Total Bilirubin: 0.2 mg/dL — ABNORMAL LOW (ref 0.3–1.2)
Total Protein: 6.6 g/dL (ref 6.0–8.3)

## 2013-01-20 LAB — CBC
Platelets: 263 10*3/uL (ref 150–400)
RDW: 13.8 % (ref 11.5–15.5)
WBC: 10.7 10*3/uL — ABNORMAL HIGH (ref 4.0–10.5)

## 2013-01-20 LAB — LIPASE, BLOOD: Lipase: 115 U/L — ABNORMAL HIGH (ref 11–59)

## 2013-01-20 MED ORDER — POLYETHYLENE GLYCOL 3350 17 G PO PACK: 17.0000 g | PACK | Freq: Every day | ORAL | Status: DC

## 2013-01-20 NOTE — Progress Notes (Signed)
Pt discharged to home accomp by family.  No Rx given as per Dr.  Cheri Kearns of discharge instructions given to pt and FU appts.  Reviewed.  Pt is to FU with Dr. Jeannetta Nap and [facility] post discharge, #'s given.

## 2013-01-20 NOTE — Progress Notes (Signed)
9 Days Post-Op  Subjective: Some mild pain, tolerating diet, wants to go home  Objective: Vital signs in last 24 hours: Temp:  [97.9 F (36.6 C)-98.3 F (36.8 C)] 98.2 F (36.8 C) (01/25 0520) Pulse Rate:  [77] 77  (01/25 0520) Resp:  [18] 18  (01/25 0520) BP: (120-125)/(56-65) 122/64 mmHg (01/25 0520) SpO2:  [95 %-98 %] 97 % (01/25 0520) Last BM Date: 01/19/13  Intake/Output from previous day:   Intake/Output this shift:    GI: nontender on exam today, bs present  Lab Results:   [facility] 01/20/13 0455 01/19/13 0728  WBC 10.7* 10.4  HGB 13.2 13.2  HCT 41.5 39.9  PLT 263 258   BMET  Basename 01/20/13 0455 01/19/13 0728  NA 138 139  K 4.6 4.1  CL 99 99  CO2 30 31  GLUCOSE 103* 104*  BUN 13 9  CREATININE 0.63 0.67  CALCIUM 9.4 9.5    Anti-infectives: Anti-infectives     Start     Dose/Rate Route Frequency Ordered Stop   01/17/13 1400   piperacillin-tazobactam (ZOSYN) IVPB 3.375 g  Status:  Discontinued        3.375 g 12.5 mL/hr over 240 Minutes Intravenous Every 8 hours 01/17/13 0720 01/17/13 1232   01/17/13 0730   piperacillin-tazobactam (ZOSYN) IVPB 3.375 g        3.375 g 100 mL/hr over 30 Minutes Intravenous  Once 01/17/13 0720 01/17/13 0927   01/06/13 1100   piperacillin-tazobactam (ZOSYN) IVPB 3.375 g  Status:  Discontinued        3.375 g 12.5 mL/hr over 240 Minutes Intravenous 3 times per day 01/06/13 1045 01/09/13 0930          Assessment/Plan: S/p lap chole Pancreatitis  Lipase down, really not symptomatic, feels well now.  This all appears to be resolving.   I think reasonable to send home on current diet.  Will have her f/u with Dr. Carolynne Edouard in next 2 weeks Would hold on further evaluation now.  He can decide when he sees her about repeat ct scan.  [facility] 01/20/2013

## 2013-01-20 NOTE — Discharge Summary (Signed)
Physician Discharge Summary  [name] [name] VWU:[id number] DOB: [birth year] DOA: 01/04/2013  PCP: Kaleen Mask, MD  Admit date: 01/04/2013 Discharge date: 01/20/2013  Time spent: Greater than 30 minutes  Recommendations for Outpatient Follow-up:  1. With Dr. Carolynne Edouard, Surgeon in 2 weeks from hospital discharge. 2. With Dr. Windle Guard, PCP in 1 week from hospital discharge.  Discharge Diagnoses:  Principal Problem:  *Gallstone pancreatitis Active Problems:  Common biliary duct obstruction  Leukocytosis  Acute respiratory failure: hypoxic and hypercarbic  Atelectasis  Pleural effusion  Hypotension  Choledocholithiasis   Discharge Condition: Improved and stable.  Diet recommendation: Bland diet.  Filed Weights   01/05/13 0001 01/08/13 1214  Weight: 125.646 kg (277 lb) 138.7 kg (305 lb 12.5 oz)    History of present illness:  57 year old female patient with history of arthritis, asthma and neuromuscular disorder was admitted on 01/04/13 with history of abdominal pain, nausea, vomiting, subjective fever and chills. Evaluation in the ED was consistent with acute biliary pancreatitis (lipase > 3000). GI and surgery consulted. She was initially treated supportively with hydration, n.p.o. status and analgesics. She developed acute hypoxic respiratory failure on 1/13 secondary to narcotics, benzodiazepines, bilateral atelectasis and +/-ALI. She was transferred to ICU with critical care consultation. She did not require intubation and improved with supportive treatment. She is S/P laparoscopic cholecystectomy with IOC 1/16. Choledocholithiasis suggested an IOC. She was transferred to regular floor.  Hospital Course:   Acute biliary pancreatitis secondary to gallstones  GI and surgery consulted. She was initially treated supportively with hydration, n.p.o. status and analgesics. S/P laparoscopic cholecystectomy with IOC 1/16. Choledocholithiasis suggested on IOC. Unable to fit in machine to  do MRCP. EUS was considered but not done. CT abdomen from 1/20 shows complex pseudocyst surrounding the pancreas and spleen and extending down into the small bowel recently. No definite findings for abscess or pancreatic necrosis. Advanced gradually to bland diet which she has tolerated without worsening symptoms or Lipase/Amylase. Surgeons have seen her today and cleared for d/c home and OP follow up. Estradiol will be held due to rare side effect of pancreatitis. May consider further work up as OP- viz repeat CT abdomen & EUS etc.  Choledocholithiasis  Evaluation and management as above. May consider outpatient EUS.   Leukocytosis  Resolved. Likely secondary to abdominal process. Does not clinically look septic or toxic. Apart from large pseudocyst (truly not pseudocyst given short temporal relationship to acute pancreatitis), no complicating features on CT abdomen. No indication for antibiotics at this time.   Hypoxic and hypercarbic respiratory failure  She developed acute hypoxic respiratory failure on 1/13 secondary to narcotics, benzodiazepines, bilateral atelectasis and +/-ALI. She was transferred to ICU with critical care consultation. She did not require intubation and improved with supportive treatment.. Continue incentive spirometry. CTA chest on 01/07/13 negative for PE.   Hyperlipidemia  On admission her total cholesterol was elevated at 225, LDL at 143. She will likely benefit from starting a lipid-lowering agent when her acute pancreatitis has completely resolved.   Consultants:  Surgery  GI  Procedures:  laparoscopic cholecystectomy with [facility] 1/16  Discharge Exam:  Complaints Denies complaints. Wants to go home. Tolerating diet without abdominal pain. Had BM today. Ambulating comfortably in halls.  Filed Vitals:   01/19/13 0505 01/19/13 1406 01/19/13 2250 01/20/13 0520  BP: 126/61 125/56 120/65 122/64  Pulse: 67  77 77  Temp: 98.2 F (36.8 C) 97.9 F (36.6 C) 98.3 F  (36.8 C) 98.2 F (36.8 C)  TempSrc:  Oral    Resp: 17 18 18 18   Height:      Weight:      SpO2: 95% 95% 98% 97%     General: NAD. Morbidly obese.   Cardiovascular: RRR, no MRG.   Respiratory: clear to auscultation. No increased work of breathing.   Abdomen: Nondistended. Soft and nontender. Normal bowel sounds heard.   Extremities: Symmetric 5 x 5 power.   CNS: Alert and oriented. No focal neurological deficits   Discharge Instructions      Discharge Orders    Future Orders Please Complete By Expires   Increase activity slowly      Discharge instructions      Comments:   DIET: Bland diet.   Call MD for:  temperature >100.4      Call MD for:  persistant nausea and vomiting      Call MD for:  severe uncontrolled pain      Call MD for:  redness, tenderness, or signs of infection (pain, swelling, redness, odor or green/yellow discharge around incision site)      Call MD for:  difficulty breathing, headache or visual disturbances      Call MD for:  extreme fatigue          Medication List     As of 01/20/2013  2:42 PM    STOP taking these medications         BISACODYL CO      estradiol 2 MG tablet   Commonly known as: ESTRACE      TAKE these medications         beta carotene w/minerals tablet   Take 1 tablet by mouth daily.      bisacodyl 5 MG EC tablet   Generic drug: bisacodyl   Take 5 mg by mouth daily as needed. For constipation      cholecalciferol 1000 UNITS tablet   Commonly known as: VITAMIN D   Take 1,000 Units by mouth daily.      FLAX SEEDS PO   Take 1 capsule by mouth daily.      gabapentin 300 MG capsule   Commonly known as: NEURONTIN   Take 300 mg by mouth 3 (three) times daily.      glucosamine-chondroitin 500-400 MG tablet   Take 1 tablet by mouth 2 (two) times daily.      HYDROcodone-acetaminophen 7.5-325 MG per tablet   Commonly known as: NORCO   Take 1 tablet by mouth every 6 (six) hours as needed. For pain       methocarbamol 750 MG tablet   Commonly known as: ROBAXIN   Take 750 mg by mouth 2 (two) times daily.      polyethylene glycol packet   Commonly known as: MIRALAX / GLYCOLAX   Take 17 g by mouth daily.      STOOL SOFTENER PO   Take 1 capsule by mouth daily as needed. For constipation         Follow-up Information    Follow up with TOTH III,PAUL S, MD. In 2 weeks. (APPT TIME PENDING)    Contact information:   [address] [place] [address] [phone]       Follow up with Kaleen Mask, MD. Schedule an appointment as soon as possible for a visit in 1 week.   Contact information:   [address] [place] [address] [phone]           The results of significant  diagnostics from this hospitalization (including imaging, microbiology, ancillary and laboratory) are listed below for reference.    Significant Diagnostic Studies: Dg Cholangiogram Operative  01/11/2013  *RADIOLOGY REPORT*  Clinical Data:   Cholecystectomy.  INTRAOPERATIVE CHOLANGIOGRAM  Technique:  Cholangiographic images from the C-arm fluoroscopic device were submitted for interpretation post-operatively.  Please see the procedural report for the amount of contrast and the fluoroscopy time utilized.  Comparison:  Abdominal CT 01/06/2012  Findings:  The intrahepatic and extrahepatic bile ducts are dilated.  No contrast is identified within the duodenum.  There may be small filling defects within the common bile duct.  IMPRESSION: The biliary system is dilated and there is concern for a distal obstruction since no contrast is identified within the duodenum.  Small filling defects within the common bile duct may represent sludge or debris.   Original Report Authenticated By: Richarda Overlie, M.D.    Ct Angio Chest Pe W/cm &/or Wo Cm  01/07/2013  *RADIOLOGY REPORT*  Clinical Data: Left-sided flank pain.  Pain with inspiration.  CT ANGIOGRAPHY CHEST  Technique:  Multidetector CT imaging of  the chest using the standard protocol during bolus administration of intravenous contrast. Multiplanar reconstructed images including MIPs were obtained and reviewed to evaluate the vascular anatomy.  Contrast: 80mL OMNIPAQUE IOHEXOL 350 MG/ML SOLN  Comparison: Chest CT 09/03/2011.  Findings:  Mediastinum: Study is limited by large amount of patient respiratory motion.  With these limitations in mind, there is no evidence of central, lobar or proximal segmental sized pulmonary embolism.  Smaller distal segmental and subsegmental sized emboli cannot be completely excluded secondary to respiratory motion. Heart size is normal. There is no significant pericardial fluid, thickening or pericardial calcification.  No pathologically enlarged mediastinal or hilar lymph nodes. Esophagus is unremarkable in appearance.  Lungs/Pleura: Moderate left and trace right-sided pleural effusions.  Passive atelectasis in the dependent portion of the left lower lobe.  In addition, there is diffuse bronchial wall thickening with some thickening of the peribronchovascular interstitium and some patchy air space disease throughout the lungs bilaterally, predominately ground-glass in attenuation, suspicious for multifocal infection and/or inflammation.  This is relatively asymmetrically distributed.  Upper Abdomen: Perisplenic ascites.  Large amount of inflammatory changes in the retroperitoneum in the expected location of the tail of the pancreas, incompletely visualized.  Musculoskeletal: There are no aggressive appearing lytic or blastic lesions noted in the visualized portions of the skeleton.  IMPRESSION: 1.  Limited examination secondary to respiratory motion demonstrating no central, lobar or proximal segmental sized pulmonary embolism. 2.  The appearance of the lung parenchyma, as above, suggests multifocal infection and/or inflammation.  Specifically, the findings are most concerning for early changes of ARDS related to underlying  pancreatitis. 3.  Moderate left and trace right-sided pleural effusions. 4.  Changes of pancreatitis incompletely visualized in the upper abdomen.   Original Report Authenticated By: Trudie Reed, M.D.    US Abdomen Complete  01/04/2013  *RADIOLOGY REPORT*  Clinical Data:  Abdominal pain.  Nausea and vomiting.  Elevated liver function test.  Elevated lipase.  COMPLETE ABDOMINAL ULTRASOUND  Comparison:  01/09/2009 CT.  Findings:  Gallbladder:  Multiple gallstones are present.  Largest measures 9 mm.  These demonstrate typical posterior acoustic shadowing and increased echogenicity. There is no wall thickening.  No sonographic Murphy's sign.  Common bile duct:  Enlarged, measuring between 12 mm and 13 mm. There is no common duct stone identified.  Liver:  Mild intrahepatic biliary ductal dilation is present.  No focal  mass lesion.  IVC:  Appears normal.  Pancreas:  Enlargement of the pancreatic head.  Again, no common duct stone is identified in the intrapancreatic distal common bile duct.  Spleen:  8.6 cm.  Normal echotexture.  Right Kidney:  12.2 cm. Normal echotexture.  Normal central sinus echo complex.  No calculi or hydronephrosis.  Left Kidney:  12.3 cm. Normal echotexture.  Normal central sinus echo complex.  No calculi or hydronephrosis.  Abdominal aorta:  2.4 cm.  IMPRESSION: 1.  Cholelithiasis without findings of acute cholecystitis. 2.  Dilated common bile duct with mild intrahepatic biliary ductal dilation.  There is no visualized common duct stone however with intra and extrahepatic biliary ductal dilation; findings suspicious for a distal common duct stone.  In the setting of elevated lipase, gallstone pancreatitis is likely. 3.  Enlargement of pancreatic head compatible with pancreatitis.   Original Report Authenticated By: Andreas Newport, M.D.    Ct Abdomen Pelvis W Contrast  01/15/2013  *RADIOLOGY REPORT*  Clinical Data: Acute pancreatitis.  CT ABDOMEN AND PELVIS WITH CONTRAST  Technique:   Multidetector CT imaging of the abdomen and pelvis was performed following the standard protocol during bolus administration of intravenous contrast.  Contrast: OMNIPAQUE IOHEXOL 300 MG/ML  SOLN  Comparison: 01/05/2013.  Findings: The lung bases demonstrate bilateral pleural effusions, left greater than right with overlying atelectasis.  These are new.  The liver demonstrates stable intrahepatic biliary dilatation.  No focal hepatic lesions.  The gallbladder is surgically absent. Stable common bile duct dilatation.  The spleen is mildly enlarged but but no focal lesions.  There is a large complex fluid collection surrounding the spleen with a thin enhancing membrane. This is most likely a pseudocyst.  There are also multiple complex pseudocyst surrounding the pancreas and in the splenic hilum and continuing down into the small bowel mesentery.  The pancreas demonstrates normal enhancement.  No findings to suggest pancreatic necrosis.  No hemorrhage or pseudoaneurysm.  The pancreatic duct is normal in caliber.  The adrenal glands and kidneys are unremarkable.  The stomach, duodenum, small bowel and colon grossly normal.  No findings for small bowel obstruction.  There is a large abdominal wall hernia containing small bowel and colon but no findings for obstruction/ incarceration.  There is extensive subcutaneous soft tissue swelling/edema which could suggest anasarca or cellulitis.  The bladder is unremarkable.  No pelvic mass or significant free pelvic fluid collection.  Surgical changes involving the lumbar spine are again demonstrated.  IMPRESSION:  1.  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 2.  No definite findings for abscess and no evidence of pancreatic necrosis. 3.  New bilateral pleural effusions, left greater than right with overlying atelectasis. 4.  Stable biliary dilatation. 5.  Large abdominal wall hernia. 6.  New  subcutaneous edema could suggest anasarca or cellulitis.   Original Report Authenticated By: Rudie Meyer, M.D.    Ct Abdomen Pelvis W Contrast  01/05/2013  *RADIOLOGY REPORT*  Clinical Data: Abdominal pain.  Nausea and vomiting.  CT ABDOMEN AND PELVIS WITH CONTRAST  Technique:  Multidetector CT imaging of the abdomen and pelvis was performed following the standard protocol during bolus administration of intravenous contrast.  Contrast: OMNIPAQUE IOHEXOL 300 MG/ML  SOLN  Comparison: CT of the abdomen and pelvis 01/09/2009.  Findings:  Lung Bases: Unremarkable.  Abdomen/Pelvis:  Gallbladder appears moderately distended and there is some pericholecystic fluid and stranding.  Mild intrahepatic  biliary ductal dilatation.  Common bile duct also appears dilated measuring up to 15 mm in diameter in the porta hepatis.  No definite radiopaque stone is identified within the common bile duct, or within the lumen of the gallbladder.  The appearance of the liver is otherwise unremarkable.  Peripancreatic stranding is noted diffusely, suggesting pancreatitis.  There is a small amount of fluid surrounding the spleen.  The adrenal glands and kidneys are unremarkable in appearance bilaterally.  Atherosclerosis throughout the abdominal and pelvic vasculature, without definite aneurysm or dissection.  Numerous colonic diverticula are noted, without definite surrounding inflammatory changes to strongly suggest acute diverticulitis at this time.  The patient has a large ventral hernia inferiorly which contains a portion of the colon and multiple loops of small bowel.  No definite signs to suggest bowel incarceration or obstruction at this time.  Low-lying rectum well below the level of the pubococcygeal line, which could suggest rectal prolapse.  Status post hysterectomy.  Ovaries are atrophic. The urinary bladder is unremarkable in appearance.  Musculoskeletal: There are no aggressive appearing lytic or blastic lesions noted  in the visualized portions of the skeleton. Anterior lumbar fixation at L4-L5 and L5-S1.  Status post laminectomy at L2, L3-L4 with PLIF from L2-L4.  Interbody grafts are present at L2-L3, L3-L4, L4-L5 and L5 - S1.  5 mm of retrolisthesis of L1 upon L2 is noted.  Alignment is otherwise anatomic.  IMPRESSION: 1.  Findings, as above, concerning for a the distal obstruction of the common bile duct, and possibly the pancreatic duct, with acute pancreatitis and possible acute cholecystitis. This could represent a stricture, recently passed a ductal stone, or a nonradiopaque stone in the distal common bile duct.  Clinical correlation is recommended. 2.  Extensive colonic diverticulosis without findings to suggest acute diverticulitis at this time.  3.  Large inferior ventral hernia containing portions of the colon and small bowel, without signs to suggest bowel obstruction at this time. 4.  Atherosclerosis. 5.  Extensive postoperative changes in the spine, as above, with 5 mm of retrolisthesis of L1 upon L2. 6.  Findings suggestive of rectal prolapse.  Clinical correlation may be warranted.   Original Report Authenticated By: Trudie Reed, M.D.    Dg Chest Port 1 View  01/10/2013  *RADIOLOGY REPORT*  Clinical Data: Follow-up  PORTABLE CHEST - 1 VIEW  Comparison: 01/09/2013  Findings: .  Lung volumes remain low.  To cardiac collapse / consolidation persists with left pleural effusion.  Vascular congestion again noted.  The radius trace amount of fluid in the minor fissure.  IMPRESSION: Slight improvement in overall aeration with persistent retrocardiac collapse / consolidation with left effusion.   Original Report Authenticated By: Kennith Center, M.D.    Dg Chest Port 1 View  01/09/2013  *RADIOLOGY REPORT*  Clinical Data: Follow up  PORTABLE CHEST - 1 VIEW  Comparison: None.  Findings: Cardiomegaly with mild to moderate interstitial edema, increased.  Worsening opacification of the left mid lung, underlying  pneumonia not excluded.  Moderate layering left pleural effusion.  No pneumothorax.  IMPRESSION: Cardiomegaly with mild to moderate interstitial edema, increased.  Moderate layering left pleural effusion.  Underlying left upper lobe pneumonia not excluded.   Original Report Authenticated By: Charline Bills, M.D.    Dg Chest Port 1 View  01/08/2013  *RADIOLOGY REPORT*  Clinical Data: Hypoxia.  PORTABLE CHEST - 1 VIEW  Comparison: CT chest 01/07/2013 and chest radiograph 01/01/2011.  Findings: Trachea is midline.  Heart size is  accentuated by AP semi upright technique and low lung volumes.  There is central pulmonary vascular congestion with left perihilar and left lower lobe air space disease.  Mild diffuse interstitial prominence.  Small left pleural effusion.  IMPRESSION: Bilateral air space disease, left greater than right.  Left upper/left lower lobe pneumonia is queried. Probable underlying edema.   Original Report Authenticated By: Leanna Battles, M.D.    Dg Abd Portable 1v  01/08/2013  *RADIOLOGY REPORT*  Clinical Data: Pancreatitis, abdominal pain and vomiting.  PORTABLE ABDOMEN - 1 VIEW  Comparison: CT of the abdomen and pelvis from 01/05/2013.  Findings: There is no evidence of bowel obstruction or significant ileus.  No gross signs of free air.  Extensive lumbar fusion hardware noted.  No abnormal calcifications identified.  IMPRESSION: No acute findings.   Original Report Authenticated By: Irish Lack, M.D.     Microbiology: No results found for this or any previous visit (from the past 240 hour(s)).   Labs: Basic Metabolic Panel:  Lab 01/20/13 5638 01/19/13 0728 01/18/13 0535 01/17/13 0555 01/16/13 0920  NA 138 139 138 138 139  K 4.6 4.1 3.9 4.3 4.5  CL 99 99 99 101 99  CO2 30 31 26 30 30   GLUCOSE 103* 104* 96 98 109*  BUN 13 9 8 8 11   CREATININE 0.63 0.67 0.54 0.60 0.54  CALCIUM 9.4 9.5 9.1 9.0 9.0  MG -- -- 2.3 -- --  PHOS -- -- 4.6 -- --   Liver Function Tests:  Lab  01/20/13 0455 01/19/13 0728 01/18/13 0535 01/17/13 0555 01/16/13 0920  AST 19 17 19 24 21   ALT 20 19 23 25 26   ALKPHOS 121* 143* 159* 179* 160*  BILITOT 0.2* 0.2* 0.3 0.3 0.2*  PROT 6.6 6.7 6.4 5.9* 6.4  ALBUMIN 2.6* 2.6* 2.4* 2.1* 2.3*    Lab 01/20/13 0455 01/19/13 0728 01/18/13 0535 01/17/13 0900 01/16/13 0920 01/15/13 1458  LIPASE 115* 133* 116* 116* 107* --  AMYLASE 139* 158* -- 161* 138* 132*   No results found for this basename: AMMONIA:5 in the last 168 hours CBC:  Lab 01/20/13 0455 01/19/13 0728 01/18/13 0535 01/17/13 0555 01/16/13 0920  WBC 10.7* 10.4 12.8* 13.6* 18.2*  NEUTROABS -- -- 9.8* -- --  HGB 13.2 13.2 12.9 12.2 13.0  HCT 41.5 39.9 39.0 37.5 39.5  MCV 89.8 88.9 88.2 88.7 89.8  PLT 263 258 237 220 262   Cardiac Enzymes: No results found for this basename: CKTOTAL:5,CKMB:5,CKMBINDEX:5,TROPONINI:5 in the last 168 hours BNP: BNP (last 3 results) No results found for this basename: PROBNP:3 in the last 8760 hours CBG:  Lab 01/17/13 0912  GLUCAP 96   Additional Labs   TSH 3.425   Signed:  Odelia Graciano  [facility] 01/20/2013, 2:42 PM

## 2013-01-23 ENCOUNTER — Telehealth (INDEPENDENT_AMBULATORY_CARE_PROVIDER_SITE_OTHER): Payer: Self-pay

## 2013-01-23 NOTE — Telephone Encounter (Signed)
LMOM to call back for f/u appt.

## 2013-01-26 ENCOUNTER — Inpatient Hospital Stay (HOSPITAL_COMMUNITY)
Admission: EM | Admit: 2013-01-26 | Discharge: 2013-02-06 | DRG: 439 | Disposition: A | Discharge status: Home Health | Payer: Medicare Other | Attending: Internal Medicine | Admitting: Internal Medicine

## 2013-01-26 ENCOUNTER — Encounter (HOSPITAL_COMMUNITY): Payer: Self-pay | Admitting: Emergency Medicine

## 2013-01-26 DIAGNOSIS — E669 Obesity, unspecified: Secondary | ICD-10-CM

## 2013-01-26 DIAGNOSIS — I82619 Acute embolism and thrombosis of superficial veins of unspecified upper extremity: Secondary | ICD-10-CM | POA: Diagnosis not present

## 2013-01-26 DIAGNOSIS — E7800 Pure hypercholesterolemia, unspecified: Secondary | ICD-10-CM | POA: Diagnosis present

## 2013-01-26 DIAGNOSIS — J309 Allergic rhinitis, unspecified: Secondary | ICD-10-CM

## 2013-01-26 DIAGNOSIS — Z6841 Body Mass Index (BMI) 40.0 and over, adult: Secondary | ICD-10-CM

## 2013-01-26 DIAGNOSIS — M62838 Other muscle spasm: Secondary | ICD-10-CM | POA: Diagnosis present

## 2013-01-26 DIAGNOSIS — M549 Dorsalgia, unspecified: Secondary | ICD-10-CM

## 2013-01-26 DIAGNOSIS — Z9089 Acquired absence of other organs: Secondary | ICD-10-CM

## 2013-01-26 DIAGNOSIS — R52 Pain, unspecified: Secondary | ICD-10-CM

## 2013-01-26 DIAGNOSIS — E876 Hypokalemia: Secondary | ICD-10-CM

## 2013-01-26 DIAGNOSIS — K862 Cyst of pancreas: Secondary | ICD-10-CM | POA: Diagnosis present

## 2013-01-26 DIAGNOSIS — K863 Pseudocyst of pancreas: Secondary | ICD-10-CM

## 2013-01-26 DIAGNOSIS — R7402 Elevation of levels of lactic acid dehydrogenase (LDH): Secondary | ICD-10-CM | POA: Diagnosis present

## 2013-01-26 DIAGNOSIS — R109 Unspecified abdominal pain: Secondary | ICD-10-CM | POA: Diagnosis present

## 2013-01-26 DIAGNOSIS — R7401 Elevation of levels of liver transaminase levels: Secondary | ICD-10-CM | POA: Diagnosis present

## 2013-01-26 DIAGNOSIS — J9600 Acute respiratory failure, unspecified whether with hypoxia or hypercapnia: Secondary | ICD-10-CM

## 2013-01-26 DIAGNOSIS — J90 Pleural effusion, not elsewhere classified: Secondary | ICD-10-CM

## 2013-01-26 DIAGNOSIS — R0602 Shortness of breath: Secondary | ICD-10-CM

## 2013-01-26 DIAGNOSIS — K851 Biliary acute pancreatitis without necrosis or infection: Secondary | ICD-10-CM

## 2013-01-26 DIAGNOSIS — K831 Obstruction of bile duct: Secondary | ICD-10-CM

## 2013-01-26 DIAGNOSIS — K861 Other chronic pancreatitis: Secondary | ICD-10-CM | POA: Diagnosis present

## 2013-01-26 DIAGNOSIS — K8050 Calculus of bile duct without cholangitis or cholecystitis without obstruction: Secondary | ICD-10-CM

## 2013-01-26 DIAGNOSIS — R05 Cough: Secondary | ICD-10-CM

## 2013-01-26 DIAGNOSIS — IMO0002 Reserved for concepts with insufficient information to code with codable children: Secondary | ICD-10-CM

## 2013-01-26 DIAGNOSIS — Z9079 Acquired absence of other genital organ(s): Secondary | ICD-10-CM

## 2013-01-26 DIAGNOSIS — R0789 Other chest pain: Secondary | ICD-10-CM

## 2013-01-26 DIAGNOSIS — K859 Acute pancreatitis without necrosis or infection, unspecified: Principal | ICD-10-CM | POA: Diagnosis present

## 2013-01-26 DIAGNOSIS — R51 Headache: Secondary | ICD-10-CM

## 2013-01-26 DIAGNOSIS — M62830 Muscle spasm of back: Secondary | ICD-10-CM

## 2013-01-26 DIAGNOSIS — J9811 Atelectasis: Secondary | ICD-10-CM

## 2013-01-26 DIAGNOSIS — R748 Abnormal levels of other serum enzymes: Secondary | ICD-10-CM | POA: Diagnosis present

## 2013-01-26 DIAGNOSIS — K5900 Constipation, unspecified: Secondary | ICD-10-CM | POA: Diagnosis not present

## 2013-01-26 DIAGNOSIS — I959 Hypotension, unspecified: Secondary | ICD-10-CM

## 2013-01-26 DIAGNOSIS — D72829 Elevated white blood cell count, unspecified: Secondary | ICD-10-CM | POA: Diagnosis present

## 2013-01-26 LAB — COMPREHENSIVE METABOLIC PANEL
ALT: 61 U/L — ABNORMAL HIGH (ref 0–35)
AST: 117 U/L — ABNORMAL HIGH (ref 0–37)
CO2: 28 mEq/L (ref 19–32)
Calcium: 9.9 mg/dL (ref 8.4–10.5)
Chloride: 99 mEq/L (ref 96–112)
GFR calc non Af Amer: >90 mL/min (ref >90)
Potassium: 4 mEq/L (ref 3.5–5.1)
Sodium: 137 mEq/L (ref 135–145)
Total Bilirubin: 0.8 mg/dL (ref 0.3–1.2)

## 2013-01-26 LAB — CBC WITH DIFFERENTIAL/PLATELET
Basophils Absolute: 0 10*3/uL (ref 0.0–0.1)
Lymphocytes Relative: 11 % — ABNORMAL LOW (ref 12–46)
Neutro Abs: 10 10*3/uL — ABNORMAL HIGH (ref 1.7–7.7)
Neutrophils Relative %: 81 % — ABNORMAL HIGH (ref 43–77)
Platelets: 293 10*3/uL (ref 150–400)
RDW: 13.1 % (ref 11.5–15.5)
WBC: 12.3 10*3/uL — ABNORMAL HIGH (ref 4.0–10.5)

## 2013-01-26 LAB — URINALYSIS, ROUTINE W REFLEX MICROSCOPIC
Glucose, UA: NEGATIVE mg/dL
Ketones, ur: NEGATIVE mg/dL
Leukocytes, UA: NEGATIVE
pH: 6 (ref 5.0–8.0)

## 2013-01-26 LAB — GLUCOSE, CAPILLARY: Glucose-Capillary: 107 mg/dL — ABNORMAL HIGH (ref 70–99)

## 2013-01-26 MED ORDER — ONDANSETRON 8 MG PO TBDP
8.0000 mg | ORAL_TABLET | Freq: Once | ORAL | Status: AC
  Administered 2013-01-26: 8 mg via ORAL
  Filled 2013-01-26: qty 1

## 2013-01-26 MED ORDER — HYDROMORPHONE HCL PF 1 MG/ML IJ SOLN
2.0000 mg | Freq: Once | INTRAMUSCULAR | Status: AC
  Administered 2013-01-26: 2 mg via INTRAVENOUS
  Filled 2013-01-26: qty 2

## 2013-01-26 MED ORDER — SODIUM CHLORIDE 0.9 % IV SOLN: INTRAVENOUS | Status: AC

## 2013-01-26 MED ORDER — HYDROMORPHONE HCL PF 2 MG/ML IJ SOLN
2.0000 mg | Freq: Once | INTRAMUSCULAR | Status: AC
  Administered 2013-01-26: 2 mg via INTRAMUSCULAR
  Filled 2013-01-26: qty 1

## 2013-01-26 MED ORDER — ONDANSETRON HCL 4 MG/2ML IJ SOLN
4.0000 mg | Freq: Four times a day (QID) | INTRAMUSCULAR | Status: DC | PRN
  Administered 2013-02-03: 4 mg via INTRAVENOUS
  Filled 2013-01-26: qty 2

## 2013-01-26 MED ORDER — HYDROMORPHONE HCL PF 1 MG/ML IJ SOLN
0.5000 mg | INTRAMUSCULAR | Status: DC | PRN
  Administered 2013-01-27: 0.5 mg via INTRAMUSCULAR
  Filled 2013-01-26: qty 1

## 2013-01-26 MED ORDER — ONDANSETRON HCL 4 MG PO TABS: 4.0000 mg | ORAL_TABLET | Freq: Four times a day (QID) | ORAL | Status: DC | PRN

## 2013-01-26 MED ORDER — SODIUM CHLORIDE 0.9 % IJ SOLN
3.0000 mL | Freq: Two times a day (BID) | INTRAMUSCULAR | Status: DC
  Administered 2013-01-28: 3 mL via INTRAVENOUS

## 2013-01-26 MED ORDER — ACETAMINOPHEN 650 MG RE SUPP: 650.0000 mg | Freq: Four times a day (QID) | RECTAL | Status: DC | PRN

## 2013-01-26 MED ORDER — ACETAMINOPHEN 325 MG PO TABS: 650.0000 mg | ORAL_TABLET | Freq: Four times a day (QID) | ORAL | Status: DC | PRN

## 2013-01-26 NOTE — ED Notes (Signed)
HQI:[id number]<XB> Expected date:<BR> Expected time:<BR> Means of arrival:<BR> Comments:<BR> 56 yof pancreatitis

## 2013-01-26 NOTE — ED Notes (Signed)
Patient had a cholecystectomy 2 weeks ago at [facility].  She has been having upper abdominal pain since her surgery.  She went to her PCP today to get blood work and pain worsened after appointment.  She was told to come to ED if pain worsened by her surgeon.  Patient was nauseated but no vomiting per EMS.

## 2013-01-26 NOTE — ED Notes (Signed)
Attempted to call report to 4th floor.  Nurse in isolation room, will call back.

## 2013-01-26 NOTE — ED Notes (Signed)
Attempted IV start.  Unsuccessful.  Dr. Preston Fleeting notified.  Blood samples obtained earlier by phlebotomy.

## 2013-01-26 NOTE — ED Notes (Signed)
Patient started with upper abdominal pain on January 5th, diagnosed with pancreatitis, then had surgery for gallbladder removal on Jan. 16th.  Patient has continued with same pain, worse after eating.  She was tolerating pain until today when pain became suddenly unbearable.  Pt broke out in a sweat became cold and clammy and had dry heaves.
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
## 2013-01-27 ENCOUNTER — Inpatient Hospital Stay (HOSPITAL_COMMUNITY): Payer: Medicare Other

## 2013-01-27 ENCOUNTER — Encounter (HOSPITAL_COMMUNITY): Payer: Self-pay | Admitting: Radiology

## 2013-01-27 DIAGNOSIS — M5380 Other specified dorsopathies, site unspecified: Secondary | ICD-10-CM

## 2013-01-27 DIAGNOSIS — K859 Acute pancreatitis without necrosis or infection, unspecified: Principal | ICD-10-CM

## 2013-01-27 DIAGNOSIS — M62830 Muscle spasm of back: Secondary | ICD-10-CM | POA: Diagnosis present

## 2013-01-27 LAB — CBC
MCH: 28.5 pg (ref 26.0–34.0)
MCHC: 31.8 g/dL (ref 30.0–36.0)
MCV: 89.6 fL (ref 78.0–100.0)
Platelets: 301 10*3/uL (ref 150–400)

## 2013-01-27 LAB — HEPATIC FUNCTION PANEL
ALT: 97 U/L — ABNORMAL HIGH (ref 0–35)
AST: 98 U/L — ABNORMAL HIGH (ref 0–37)
Albumin: 3.1 g/dL — ABNORMAL LOW (ref 3.5–5.2)
Alkaline Phosphatase: 157 U/L — ABNORMAL HIGH (ref 39–117)
Bilirubin, Direct: 0.1 mg/dL (ref 0.0–0.3)
Total Bilirubin: 0.4 mg/dL (ref 0.3–1.2)

## 2013-01-27 LAB — BASIC METABOLIC PANEL
BUN: 13 mg/dL (ref 6–23)
CO2: 32 mEq/L (ref 19–32)
Chloride: 99 mEq/L (ref 96–112)
GFR calc non Af Amer: >90 mL/min (ref >90)
Glucose, Bld: 108 mg/dL — ABNORMAL HIGH (ref 70–99)
Potassium: 4.9 mEq/L (ref 3.5–5.1)

## 2013-01-27 LAB — LIPASE, BLOOD: Lipase: 195 U/L — ABNORMAL HIGH (ref 11–59)

## 2013-01-27 MED ORDER — METHOCARBAMOL 100 MG/ML IJ SOLN
750.0000 mg | Freq: Four times a day (QID) | INTRAVENOUS | Status: AC | PRN
  Administered 2013-01-27 – 2013-01-30 (×8): 750 mg via INTRAVENOUS
  Filled 2013-01-27 (×8): qty 7.5

## 2013-01-27 MED ORDER — IOHEXOL 300 MG/ML  SOLN
100.0000 mL | Freq: Once | INTRAMUSCULAR | Status: AC | PRN
  Administered 2013-01-27: 100 mL via INTRAVENOUS

## 2013-01-27 MED ORDER — HYDROMORPHONE HCL PF 1 MG/ML IJ SOLN
1.0000 mg | INTRAMUSCULAR | Status: DC | PRN
  Administered 2013-01-27: 1 mg via INTRAMUSCULAR
  Filled 2013-01-27: qty 1

## 2013-01-27 MED ORDER — SODIUM CHLORIDE 0.9 % IJ SOLN
10.0000 mL | Freq: Two times a day (BID) | INTRAMUSCULAR | Status: DC
  Administered 2013-01-28 – 2013-02-01 (×3): 10 mL

## 2013-01-27 MED ORDER — IOHEXOL 300 MG/ML  SOLN
25.0000 mL | INTRAMUSCULAR | Status: AC
  Administered 2013-01-27 (×2): 25 mL via ORAL

## 2013-01-27 MED ORDER — IOHEXOL 300 MG/ML  SOLN
25.0000 mL | Freq: Once | INTRAMUSCULAR | Status: AC | PRN
  Administered 2013-01-27: 25 mL via ORAL

## 2013-01-27 MED ORDER — SODIUM CHLORIDE 0.9 % IJ SOLN
10.0000 mL | INTRAMUSCULAR | Status: DC | PRN
  Administered 2013-01-27 – 2013-01-30 (×2): 10 mL

## 2013-01-27 MED ORDER — HYDROMORPHONE HCL PF 1 MG/ML IJ SOLN
1.0000 mg | INTRAMUSCULAR | Status: DC | PRN
  Administered 2013-01-27 – 2013-02-02 (×32): 1 mg via INTRAVENOUS
  Filled 2013-01-27 (×33): qty 1

## 2013-01-27 NOTE — Progress Notes (Signed)
[name] [name] 11:10 AM  Subjective: Patient well-known to our team from a recent bout of gallstone pancreatitis and residual pseudocyst and questionable residual CBD stone who has had pain since discharge but seemed to get worse yesterday and she presented back to the emergency room and was admitted for further workup and plans  Objective: Vital signs stable afebrile no acute distress abdomen is soft good bowel sound minimal midepigastric and left upper quadrant discomfort white count decreased today compared to yesterday as has lipase and liver tests are minimally elevated Assessment: Pancreatitis with pseudocyst questionable residual CBD stone  Plan: Await CT scan and if no significant change consider EUS on Monday to rule out CBD stones and call me sooner when necessary and the case was discussed with the patient and her family and the hospital team  [facility]

## 2013-01-27 NOTE — Progress Notes (Signed)
Utilization review completed.  

## 2013-01-27 NOTE — Progress Notes (Signed)
Peripherally Inserted Central Catheter/Midline Placement  The IV Nurse has discussed with the patient and/or persons authorized to consent for the patient, the purpose of this procedure and the potential benefits and risks involved with this procedure.  The benefits include less needle sticks, lab draws from the catheter and patient may be discharged home with the catheter.  Risks include, but not limited to, infection, bleeding, blood clot (thrombus formation), and puncture of an artery; nerve damage and irregular heat beat.  Alternatives to this procedure were also discussed.  PICC/Midline Placement Documentation  PICC / Midline Double Lumen 01/27/13 PICC Right Basilic (Active)       Ethelda Chick 01/27/2013, 9:32 AM

## 2013-01-27 NOTE — Progress Notes (Signed)
Pt refused to have cbg  done

## 2013-01-27 NOTE — Progress Notes (Addendum)
[facility] PROGRESS NOTE  [name] [name] ZOX:[id number] DOB: [birth year] DOA: 01/26/2013 PCP: [name] Mask, MD  Brief narrative 57 year-old female who was recently discharged after being treated for gallstone pancreatitis , had laparoscopic cholecystectomy and cholangiogram  showing possibility of choledocholithiasis. MRCP was unable to be done due to the patient's weight. EUS was planned but not done and eventually planned to be done as outpatient. Patient had a CT scan of the abdomen which showed a tear take pseudocyst formation. Patient discharge home felt better for few days however her epigastric pain recurred one day prior to admission.  Assessment/Plan: Abdominal pain - in the setting of recent gallstone pancreatitis with possible choledocholithiasis on cholangiogram. -Mild transaminitis and elevated lipase.  -A PICC line has been placed for poor IV access and patient awaiting for a CT scan on the abdomen to evaluate the pseudocyst further dosing on last CT. Continue with pain control and IV fluids. -Seen by Dr. Ewing [name] with [name] and recommends following CT scan to evaluate for pseudocyst if this is stable we'll plan for EUS on 2/3. -Continue antiemetic for nausea  Back spasms Patient is on Robaxin as outpatient and is in a lot of distress currently. Will order  DVT prophylaxis    Code Status: Full Family Communication: Family at bedside Disposition Plan: Home once stable   Consultants:  [facility]  Procedures:  CT of the abdomen  Antibiotics:  None  HPI/Subjective: Complaints of some epigastric pain with nausea. Has some worsening muscle spasms with back.  Objective: Filed Vitals:   01/26/13 2223 01/26/13 2232 01/26/13 2328 01/27/13 0446  BP: 106/63 106/63 136/85 111/65  Pulse: 87 73 81 75  Temp: 98 F (36.7 C) 97.8 F (36.6 C) 97.6 F (36.4 C) 98.1 F (36.7 C)  TempSrc:  Oral Oral Oral  Resp: 20 18 18 18   Height:   5\' 5"  (1.651 m)   Weight:    118.616 kg (261 lb 8 oz)   SpO2: 97% 96% 96% 94%   No intake or output data in the 24 hours ending 01/27/13 1121 Filed Weights   01/26/13 1650 01/26/13 2328  Weight: 117.935 kg (260 lb) 118.616 kg (261 lb 8 oz)    Exam:   General:  Middle aged obese female lying in bed in some discomfort with back spasms  HEENT: No pallor, no icterus, moist oral mucosa  Cardiovascular: Normal S1 and S2, no murmurs rub or gallop  Respiratory: Clear to auscultation bilaterally no added sounds   Abdomen: Soft, nondistended, epigastric tenderness to palpation, bowel sounds present  Extremities: Warm, no edema  CNS: AAO x3  Data Reviewed: Basic Metabolic Panel:  Lab 01/27/13 8119 01/26/13 1750  NA 138 137  K 4.9 4.0  CL 99 99  CO2 32 28  GLUCOSE 108* 126*  BUN 13 12  CREATININE 0.77 0.60  CALCIUM 9.7 9.9  MG -- --  PHOS -- --   Liver Function Tests:  Lab 01/27/13 0511 01/26/13 1750  AST 98* 117*  ALT 97* 61*  ALKPHOS 157* 133*  BILITOT 0.4 0.8  PROT 6.9 7.0  ALBUMIN 3.1* 3.2*    Lab 01/27/13 0511 01/26/13 1750  LIPASE 195* 277*  AMYLASE -- 226*   No results found for this basename: AMMONIA:5 in the last 168 hours CBC:  Lab 01/27/13 0511 01/26/13 1750  WBC 7.3 12.3*  NEUTROABS -- 10.0*  HGB 13.5 14.6  HCT 42.4 43.9  MCV 89.6 88.2  PLT 301 293  Cardiac Enzymes: No results found for this basename: CKTOTAL:5,CKMB:5,CKMBINDEX:5,TROPONINI:5 in the last 168 hours BNP (last 3 results) No results found for this basename: PROBNP:3 in the last 8760 hours CBG:  Lab 01/27/13 0544 01/26/13 2336  GLUCAP 110* 107*    No results found for this or any previous visit (from the past 240 hour(s)).   Studies: No results found.  Scheduled Meds:   . sodium chloride  10-40 mL Intracatheter Q12H  . sodium chloride  3 mL Intravenous Q12H   Continuous Infusions:   . sodium chloride         Time spent: 25 minutes    [name] [name]  [facility] Pager  [phone] If 8PM-8AM, please contact night-coverage at [email or web address], password [facility] 01/27/2013, 11:21 AM  LOS: 1 day

## 2013-01-28 DIAGNOSIS — K863 Pseudocyst of pancreas: Secondary | ICD-10-CM

## 2013-01-28 DIAGNOSIS — K862 Cyst of pancreas: Secondary | ICD-10-CM

## 2013-01-28 LAB — BASIC METABOLIC PANEL
BUN: 9 mg/dL (ref 6–23)
Calcium: 9.2 mg/dL (ref 8.4–10.5)
GFR calc Af Amer: >90 mL/min (ref >90)
GFR calc non Af Amer: >90 mL/min (ref >90)
Potassium: 4.1 mEq/L (ref 3.5–5.1)

## 2013-01-28 LAB — GLUCOSE, CAPILLARY
Glucose-Capillary: 106 mg/dL — ABNORMAL HIGH (ref 70–99)
Glucose-Capillary: 95 mg/dL (ref 70–99)

## 2013-01-28 LAB — LIPASE, BLOOD: Lipase: 104 U/L — ABNORMAL HIGH (ref 11–59)

## 2013-01-28 LAB — HEPATIC FUNCTION PANEL
AST: 51 U/L — ABNORMAL HIGH (ref 0–37)
Albumin: 2.9 g/dL — ABNORMAL LOW (ref 3.5–5.2)
Bilirubin, Direct: 0.1 mg/dL (ref 0.0–0.3)

## 2013-01-28 MED ORDER — INSULIN ASPART 100 UNIT/ML ~~LOC~~ SOLN
0.0000 [IU] | Freq: Four times a day (QID) | SUBCUTANEOUS | Status: DC
  Administered 2013-01-30 – 2013-02-02 (×6): 1 [IU] via SUBCUTANEOUS
  Administered 2013-02-03: 2 [IU] via SUBCUTANEOUS
  Administered 2013-02-04 – 2013-02-05 (×2): 1 [IU] via SUBCUTANEOUS

## 2013-01-28 MED ORDER — PANTOPRAZOLE SODIUM 40 MG IV SOLR
40.0000 mg | INTRAVENOUS | Status: DC
  Administered 2013-01-28 – 2013-02-06 (×10): 40 mg via INTRAVENOUS
  Filled 2013-01-28 (×10): qty 40

## 2013-01-28 MED ORDER — PANCRELIPASE (LIP-PROT-AMYL) 12000-38000 UNITS PO CPEP
2.0000 | ORAL_CAPSULE | Freq: Three times a day (TID) | ORAL | Status: DC
  Administered 2013-01-28 – 2013-02-06 (×26): 2 via ORAL
  Filled 2013-01-28 (×30): qty 2

## 2013-01-28 MED ORDER — CLINIMIX E/DEXTROSE (5/15) 5 % IV SOLN
INTRAVENOUS | Status: AC
  Administered 2013-01-28: 18:00:00 via INTRAVENOUS
  Filled 2013-01-28: qty 1000

## 2013-01-28 MED ORDER — SODIUM CHLORIDE 0.9 % IV SOLN
INTRAVENOUS | Status: DC
  Administered 2013-01-29 – 2013-02-04 (×5): via INTRAVENOUS
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
## 2013-01-28 NOTE — Progress Notes (Signed)
Subjective: She is postop day #17 from laparoscopic cholecystectomy with IOC that suggested a CBD stone.  She was too large to do an MRCP.  She had some recurrence of the pancreatitis postop which improved clinically.  2 days ago, she had worsening pain after eating lunch.  Labs and CT reviewed.  Objective: Vital signs in last 24 hours: Temp:  [98.2 F (36.8 C)-98.8 F (37.1 C)] 98.8 F (37.1 C) (02/02 0517) Pulse Rate:  [69-70] 70  (02/02 0517) Resp:  [16-18] 18  (02/02 0517) BP: (105-129)/(50-70) 129/70 mmHg (02/02 0517) SpO2:  [96 %-97 %] 96 % (02/02 0517) Last BM Date: 01/26/13  Intake/Output from previous day: 02/01 0701 - 02/02 0700 In: 480 [P.O.:480] Out: 1300 [Urine:1300] Intake/Output this shift: Total I/O In: -  Out: 401 [Urine:400; Stool:1]  PE: General- In NAD Abdomen-soft, tender in epigastrium, incisions clean and intact  Lab Results:   Basename 01/27/13 0511 01/26/13 1750  WBC 7.3 12.3*  HGB 13.5 14.6  HCT 42.4 43.9  PLT 301 293   BMET  Basename 01/27/13 0511 01/26/13 1750  NA 138 137  K 4.9 4.0  CL 99 99  CO2 32 28  GLUCOSE 108* 126*  BUN 13 12  CREATININE 0.77 0.60  CALCIUM 9.7 9.9   PT/INR No results found for this basename: LABPROT:2,INR:2 in the last 72 hours Comprehensive Metabolic Panel:    Component Value Date/Time   NA 138 01/27/2013 0511   K 4.9 01/27/2013 0511   CL 99 01/27/2013 0511   CO2 32 01/27/2013 0511   BUN 13 01/27/2013 0511   CREATININE 0.77 01/27/2013 0511   GLUCOSE 108* 01/27/2013 0511   CALCIUM 9.7 01/27/2013 0511   AST 51* 01/28/2013 0500   ALT 76* 01/28/2013 0500   ALKPHOS 151* 01/28/2013 0500   BILITOT 0.4 01/28/2013 0500   PROT 6.4 01/28/2013 0500   ALBUMIN 2.9* 01/28/2013 0500     Studies/Results: Ct Abdomen Pelvis W Contrast  01/27/2013  *RADIOLOGY REPORT*  Clinical Data: Status post cholecystectomy with postop choledocholithiasis.  Gallstone pancreatitis with known pseudocysts.  CT ABDOMEN AND PELVIS WITH CONTRAST  Technique:   Multidetector CT imaging of the abdomen and pelvis was performed following the standard protocol during bolus administration of intravenous contrast.  Contrast:  100 ml Omnipaque-300 IV  Comparison: 01/15/2013  Findings: Linear scarring versus atelectasis at the left lung base. Trace left pleural effusion, improved.  Liver is within normal limits.  Spleen is notable for small volume perisplenic fluid, likely reflecting a pseudocyst, improved.  In addition, at least four distinct peripancreatic pseudocysts are present: --9.4 x 4.9 cm collection adjacent to the pancreatic tail (series 2/image 22), previously 10.9 x 5.6 cm --4.7 x 7.8 cm collection anterior to the pancreatic body (series 2/image 32), previously well defined, measuring 5.8 x 9.0 cm --2.9 x 2.5 cm collection inferior to the pancreatic head (series 2/image 46), previously 3.5 x 2.9 cm --4.7 x 4.0 cm collection in the right mid abdomen (series 2/image 5), previously 4.9 x 4.9 cm  While all of these collections have a thin rim, compatible with pseudocysts, none of them demonstrate a thickened rim to suggest abscess.  No evidence of pancreatic necrosis.  Status post cholecystectomy.  No intrahepatic ductal dilatation. Stable common bile duct, measuring up to 2.0 cm in diameter, but tapering at the ampulla.  Left adrenal gland is mildly nodular/thickened, unchanged.  Right adrenal gland is unremarkable.  Kidneys are within normal limits.  No hydronephrosis.  No evidence of  bowel obstruction.  Large abdominal hernia containing multiple loops of small and large bowel, unchanged. Colonic diverticulosis, without associated inflammatory changes.  Atherosclerotic calcifications of the abdominal aorta and branch vessels.  No abdominopelvic ascites.  No suspicious abdominopelvic lymphadenopathy.  Status post hysterectomy.  Bilateral ovaries are unremarkable.  Bladder is within normal limits.  Degenerative changes of the lumbar spine.  Status post PLIF from L2- 4.   Status post ALIF at L4-5 and L5-S1.  IMPRESSION: Multiple peripancreatic and perisplenic pseudocysts, as described above, mildly improved.  No evidence of abscess.  Status post cholecystectomy.  Stable common bile duct.  No intrahepatic ductal dilatation.  Large abdominal hernia containing multiple loops of small and large bowel.  No evidence of bowel obstruction.   Original Report Authenticated By: Charline Bills, M.D.     Anti-infectives: Anti-infectives    None      Assessment Principal Problem:  Acute pancreatitis complicated by recurrence and evolving pseudocyst,most of which are getting smaller. Active Problems:   Suspicion of Choledocholithiasis      LOS: 2 days   Plan: Bowel rest.  TPN. EUS.   Kemani Demarais J 01/28/2013

## 2013-01-28 NOTE — Progress Notes (Signed)
[facility] PROGRESS NOTE  [name] [name]:[id number] DOB: 1956-09-24 DOA: 01/26/2013 PCP: [name] Mask, MD  Brief narrative  57 year-old female who was recently discharged after being treated for gallstone pancreatitis , had laparoscopic cholecystectomy and cholangiogram showing possibility of choledocholithiasis. MRCP was unable to be done due to the patient's weight. EUS was planned but not done and eventually planned to be done as outpatient. Patient had a CT scan of the abdomen which showed  pseudocyst formation. Patient discharge home felt better for few days however her epigastric pain recurred one day prior to admission.   Assessment/Plan:  Abdominal pain  - in the setting of recent gallstone pancreatitis with possible choledocholithiasis on cholangiogram.  -Mild transaminitis and elevated lipase.  -A PICC line has been placed for poor IV access. The CT of the abdomen shows multiple pancreatic pseudocyst however appears to be improved compared to previous imaging . Continue with pain control and IV fluids.  -Seen by Dr. Ewing Schlein with [name] and recommends following CT scan to evaluate for pseudocyst if this is stable we'll plan for EUS on 2/3.  -Continue antiemetic for nausea   Back spasms  Resume outpatient [name]. Back spasm seems to have improved  DVT prophylaxis   Code Status: Full  Family Communication: None at bedside Disposition Plan: Home once stable  Consultants:  [facility] Procedures:  CT of the abdomen US plan Antibiotics:  None   HPI/Subjective:  Complaints of persistent  epigastric pain with nausea.   Objective: Filed Vitals:   01/27/13 0446 01/27/13 1409 01/27/13 2148 01/28/13 0517  BP: 111/65 105/50 120/68 129/70  Pulse: 75 69 70 70  Temp: 98.1 F (36.7 C) 98.3 F (36.8 C) 98.2 F (36.8 C) 98.8 F (37.1 C)  TempSrc: Oral Oral Oral Oral  Resp: 18 16 18 18   Height:      Weight:      SpO2: 94% 97% 96% 96%    Intake/Output  Summary (Last 24 hours) at 01/28/13 1017 Last data filed at 01/28/13 0900  Gross per 24 hour  Intake    480 ml  Output   1701 ml  Net  -1221 ml   Filed Weights   01/26/13 1650 01/26/13 2328  Weight: 117.935 kg (260 lb) 118.616 kg (261 lb 8 oz)    Exam:  General: Middle aged obese female lying in bed in some discomfort with epigastric pain HEENT: No pallor, no icterus, moist oral mucosa  Cardiovascular: Normal S1 and S2, no murmurs rub or gallop  Respiratory: Clear to auscultation bilaterally no added sounds  Abdomen: Soft, nondistended, epigastric tenderness to palpation, bowel sounds present  Extremities: Warm, no edema  CNS: AAO x3  Data Reviewed: Basic Metabolic Panel:  Lab 01/27/13 8119 01/26/13 1750  NA 138 137  K 4.9 4.0  CL 99 99  CO2 32 28  GLUCOSE 108* 126*  BUN 13 12  CREATININE 0.77 0.60  CALCIUM 9.7 9.9  MG -- --  PHOS -- --   Liver Function Tests:  Lab 01/28/13 0500 01/27/13 0511 01/26/13 1750  AST 51* 98* 117*  ALT 76* 97* 61*  ALKPHOS 151* 157* 133*  BILITOT 0.4 0.4 0.8  PROT 6.4 6.9 7.0  ALBUMIN 2.9* 3.1* 3.2*    Lab 01/28/13 0500 01/27/13 0511 01/26/13 1750  LIPASE 104* 195* 277*  AMYLASE -- -- 226*   No results found for this basename: AMMONIA:5 in the last 168 hours CBC:  Lab 01/27/13 0511 01/26/13 1750  WBC 7.3  12.3*  NEUTROABS -- 10.0*  HGB 13.5 14.6  HCT 42.4 43.9  MCV 89.6 88.2  PLT 301 293   Cardiac Enzymes: No results found for this basename: CKTOTAL:5,CKMB:5,CKMBINDEX:5,TROPONINI:5 in the last 168 hours BNP (last 3 results) No results found for this basename: PROBNP:3 in the last 8760 hours CBG:  Lab 01/28/13 0627 01/28/13 0010 01/27/13 0544 01/26/13 2336  GLUCAP 106* 86 110* 107*    No results found for this or any previous visit (from the past 240 hour(s)).   Studies: Ct Abdomen Pelvis W Contrast  01/27/2013  *RADIOLOGY REPORT*  Clinical Data: Status post cholecystectomy with postop choledocholithiasis.  Gallstone  pancreatitis with known pseudocysts.  CT ABDOMEN AND PELVIS WITH CONTRAST  Technique:  Multidetector CT imaging of the abdomen and pelvis was performed following the standard protocol during bolus administration of intravenous contrast.  Contrast:  100 ml Omnipaque-300 IV  Comparison: 01/15/2013  Findings: Linear scarring versus atelectasis at the left lung base. Trace left pleural effusion, improved.  Liver is within normal limits.  Spleen is notable for small volume perisplenic fluid, likely reflecting a pseudocyst, improved.  In addition, at least four distinct peripancreatic pseudocysts are present: --9.4 x 4.9 cm collection adjacent to the pancreatic tail (series 2/image 22), previously 10.9 x 5.6 cm --4.7 x 7.8 cm collection anterior to the pancreatic body (series 2/image 32), previously well defined, measuring 5.8 x 9.0 cm --2.9 x 2.5 cm collection inferior to the pancreatic head (series 2/image 46), previously 3.5 x 2.9 cm --4.7 x 4.0 cm collection in the right mid abdomen (series 2/image 5), previously 4.9 x 4.9 cm  While all of these collections have a thin rim, compatible with pseudocysts, none of them demonstrate a thickened rim to suggest abscess.  No evidence of pancreatic necrosis.  Status post cholecystectomy.  No intrahepatic ductal dilatation. Stable common bile duct, measuring up to 2.0 cm in diameter, but tapering at the ampulla.  Left adrenal gland is mildly nodular/thickened, unchanged.  Right adrenal gland is unremarkable.  Kidneys are within normal limits.  No hydronephrosis.  No evidence of bowel obstruction.  Large abdominal hernia containing multiple loops of small and large bowel, unchanged. Colonic diverticulosis, without associated inflammatory changes.  Atherosclerotic calcifications of the abdominal aorta and branch vessels.  No abdominopelvic ascites.  No suspicious abdominopelvic lymphadenopathy.  Status post hysterectomy.  Bilateral ovaries are unremarkable.  Bladder is within  normal limits.  Degenerative changes of the lumbar spine.  Status post PLIF from L2- 4.  Status post ALIF at L4-5 and L5-S1.  IMPRESSION: Multiple peripancreatic and perisplenic pseudocysts, as described above, mildly improved.  No evidence of abscess.  Status post cholecystectomy.  Stable common bile duct.  No intrahepatic ductal dilatation.  Large abdominal hernia containing multiple loops of small and large bowel.  No evidence of bowel obstruction.   Original Report Authenticated By: Charline Bills, M.D.     Scheduled Meds:   . sodium chloride  10-40 mL Intracatheter Q12H  . sodium chloride  3 mL Intravenous Q12H   Continuous Infusions:   . sodium chloride        Time spent: 25 minutes    Anil Havard  [facility] Pager [phone]. If 8PM-8AM, please contact night-coverage at [email or web address], password [facility] 01/28/2013, 10:17 AM  LOS: 2 days
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
## 2013-01-28 NOTE — Progress Notes (Signed)
[name] [name] 11:47 AM  Subjective: Patient is doing better overall but still had a little midepigastric discomfort with breakfast but no new complaints and her CT was reviewed and we discussed pseudocysts  Objective: Vital signs stable afebrile no acute distress abdomen is soft essentially nontender liver tests slightly decreased CT shows pseudocyst slightly decreased without any new worrisome problem  Assessment: Gallstone pancreatitis and pseudocysts questionable CBD stones on IOC  Plan: The risks benefits methods of EUS was discussed with the patient and my partner Dr. Dulce Sellar will proceed tomorrow late morning with further workup and plans pending those findings and consider cyst drainage in 3 weeks unless they have decreased significantly and will go ahead and add pancreatic enzymes and if cyst need to be drained consider surgical drainage and fixing her hernias at the same time [facility] E
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
## 2013-01-28 NOTE — ED Provider Notes (Signed)
Medical screening examination/treatment/procedure(s) were conducted as a shared visit with non-physician practitioner(s) and myself.  I personally evaluated the patient during the encounter. Abdominal pain. Pancreatitis. Has pseudocysts. Unable to manage at home and will be admitted  Juliet Rude. Rubin Payor, MD 01/28/13 [phone]

## 2013-01-29 ENCOUNTER — Inpatient Hospital Stay (HOSPITAL_COMMUNITY): Payer: Medicare Other | Admitting: Certified Registered Nurse Anesthetist

## 2013-01-29 ENCOUNTER — Encounter (HOSPITAL_COMMUNITY): Payer: Self-pay | Admitting: *Deleted

## 2013-01-29 ENCOUNTER — Encounter (HOSPITAL_COMMUNITY): Payer: Self-pay | Admitting: Certified Registered Nurse Anesthetist

## 2013-01-29 ENCOUNTER — Encounter (HOSPITAL_COMMUNITY)
Admission: EM | Disposition: A | Discharge status: Home Health | Payer: Self-pay | Source: Home / Self Care | Attending: Internal Medicine

## 2013-01-29 DIAGNOSIS — E876 Hypokalemia: Secondary | ICD-10-CM | POA: Diagnosis not present

## 2013-01-29 DIAGNOSIS — K863 Pseudocyst of pancreas: Secondary | ICD-10-CM | POA: Diagnosis present

## 2013-01-29 DIAGNOSIS — K862 Cyst of pancreas: Secondary | ICD-10-CM

## 2013-01-29 HISTORY — PX: EUS: SHX5427

## 2013-01-29 LAB — DIFFERENTIAL
Basophils Absolute: 0 10*3/uL (ref 0.0–0.1)
Eosinophils Absolute: 0.4 10*3/uL (ref 0.0–0.7)
Lymphocytes Relative: 26 % (ref 12–46)
Lymphs Abs: 1.4 10*3/uL (ref 0.7–4.0)
Neutrophils Relative %: 56 % (ref 43–77)

## 2013-01-29 LAB — CBC
MCH: 28.9 pg (ref 26.0–34.0)
Platelets: 214 10*3/uL (ref 150–400)
RBC: 4.36 MIL/uL (ref 3.87–5.11)
WBC: 5.4 10*3/uL (ref 4.0–10.5)

## 2013-01-29 LAB — GLUCOSE, CAPILLARY
Glucose-Capillary: 98 mg/dL (ref 70–99)
Glucose-Capillary: 104 mg/dL — ABNORMAL HIGH (ref 70–99)
Glucose-Capillary: 112 mg/dL — ABNORMAL HIGH (ref 70–99)

## 2013-01-29 LAB — LIPASE, BLOOD: Lipase: 97 U/L — ABNORMAL HIGH (ref 11–59)

## 2013-01-29 LAB — COMPREHENSIVE METABOLIC PANEL
Albumin: 2.9 g/dL — ABNORMAL LOW (ref 3.5–5.2)
BUN: 9 mg/dL (ref 6–23)
Calcium: 9 mg/dL (ref 8.4–10.5)
Creatinine, Ser: 0.57 mg/dL (ref 0.50–1.10)
Total Protein: 6.1 g/dL (ref 6.0–8.3)

## 2013-01-29 LAB — TRIGLYCERIDES: Triglycerides: 153 mg/dL — ABNORMAL HIGH (ref <150)

## 2013-01-29 LAB — CHOLESTEROL, TOTAL: Cholesterol: 158 mg/dL (ref 0–200)

## 2013-01-29 LAB — MAGNESIUM: Magnesium: 2 mg/dL (ref 1.5–2.5)

## 2013-01-29 SURGERY — ESOPHAGEAL ENDOSCOPIC ULTRASOUND (EUS) RADIAL
Anesthesia: Monitor Anesthesia Care

## 2013-01-29 MED ORDER — SODIUM CHLORIDE 0.9 % IV SOLN: INTRAVENOUS | Status: DC

## 2013-01-29 MED ORDER — ONDANSETRON HCL 4 MG/2ML IJ SOLN
INTRAMUSCULAR | Status: DC | PRN
  Administered 2013-01-29: 4 mg via INTRAVENOUS

## 2013-01-29 MED ORDER — TRACE MINERALS CR-CU-F-FE-I-MN-MO-SE-ZN IV SOLN
INTRAVENOUS | Status: AC
  Administered 2013-01-29: 18:00:00 via INTRAVENOUS
  Filled 2013-01-29: qty 2000

## 2013-01-29 MED ORDER — FENTANYL CITRATE 0.05 MG/ML IJ SOLN
INTRAMUSCULAR | Status: DC | PRN
  Administered 2013-01-29: 50 ug via INTRAVENOUS

## 2013-01-29 MED ORDER — PROPOFOL INFUSION 10 MG/ML OPTIME
INTRAVENOUS | Status: DC | PRN
  Administered 2013-01-29: 160 ug/kg/min via INTRAVENOUS

## 2013-01-29 MED ORDER — FAT EMULSION 20 % IV EMUL
250.0000 mL | INTRAVENOUS | Status: AC
  Administered 2013-01-29: 250 mL via INTRAVENOUS
  Filled 2013-01-29: qty 250

## 2013-01-29 MED ORDER — LACTATED RINGERS IV SOLN
INTRAVENOUS | Status: DC | PRN
  Administered 2013-01-29: 10:00:00 via INTRAVENOUS

## 2013-01-29 MED ORDER — LACTATED RINGERS IV SOLN: INTRAVENOUS | Status: DC

## 2013-01-29 MED ORDER — KETAMINE HCL 10 MG/ML IJ SOLN
INTRAMUSCULAR | Status: DC | PRN
  Administered 2013-01-29: 20 mg via INTRAVENOUS

## 2013-01-29 MED ORDER — PROMETHAZINE HCL 25 MG/ML IJ SOLN: 6.2500 mg | INTRAMUSCULAR | Status: DC | PRN

## 2013-01-29 MED ORDER — MIDAZOLAM HCL 5 MG/5ML IJ SOLN
INTRAMUSCULAR | Status: DC | PRN
  Administered 2013-01-29: 2 mg via INTRAVENOUS

## 2013-01-29 MED ORDER — BUTAMBEN-TETRACAINE-BENZOCAINE 2-2-14 % EX AERO
INHALATION_SPRAY | CUTANEOUS | Status: DC | PRN
  Administered 2013-01-29: 2 via TOPICAL

## 2013-01-29 NOTE — Progress Notes (Addendum)
57 yoF with morbid obesity and severe malnutrition in setting of biliary pancreatitis, pseudocysts, and ? CBD stones on TPN.  S/p cholecystecomy 1/16 with continued abdominal pain, noted plans for ERCP today. Pt reported 28 lb weight lost in last month.  Per RN, pt tolerated 1st day of TPN without any issues.    Insulin Requirements in the past 24 hours: 0 units  Nutritional Goals:  RD recs:  Kcal: 2000-2150 kcal, Protein: 110-120 g, Fluid: 2.0 L  Clinimix E5/20  at a goal rate of 83 ml/hr + IVFE 20% at 10 ml/hr on MWF to provide: 100 g/day protein, 1959 Kcal/day avg.(2233 Kcal/day MWF, 1753 Kcal/day STTHS).  Current Nutrition: Clear liquids (75%)  IVF: NS @ 35 ml/hr  Assessment:   Electrolytes: K 3.4, all others WNL  LFTs:  Improving, Alk phos and ALT still slightly elevated  TGs: (2/3): 153 ok  Prealbumin: pending  Renal: SCr WNL, CrCl > 100 ml/min  Plan:   At 1800 today:  Change to Clinimix E5/20 at 60 ml/hr  Fat emulsion at 10 ml/hr(MWF only due to ongoing shortage).  Plan to advance Clinimix as tolerated to a goal rate of 83 ml/hr: 100g/day protein and 2233 Kcal/day MWF, 1753 Kcal/day STTHS(Avg. 1959 Kcal/day weekly).  TNA to contain standard multivitamins and trace elements(MWF only due to ongoing shortage).  Maintain IVF @ 35 ml/hr.  Continue SSI q 4 h  TNA lab panels on Mondays & Thursdays.  F/u daily.

## 2013-01-29 NOTE — Progress Notes (Signed)
Patient ID: [name] [name], female   DOB: 14-Apr-1956, 57 y.o.   MRN: [id number] [facility] Surgery Progress Note:   * No surgery date entered *  Subjective: Mental status is clear. Awaiting ERCP Objective: Vital signs in last 24 hours: Temp:  [97.8 F (36.6 C)-98 F (36.7 C)] 97.8 F (36.6 C) (02/03 0636) Pulse Rate:  [61-86] 64  (02/03 0636) Resp:  [16-18] 16  (02/03 0636) BP: (112-126)/(69-89) 123/69 mmHg (02/03 0636) SpO2:  [97 %] 97 % (02/03 0636)  Intake/Output from previous day: 02/02 0701 - 02/03 0700 In: 1397.8 [I.V.:655; IV Piggyback:297.5; TPN:445.3] Out: 1301 [Urine:1300; Stool:1] Intake/Output this shift:    Physical Exam: Work of breathing is normal  Lab Results:  Results for orders placed during the hospital encounter of 01/26/13 (from the past 48 hour(s))  GLUCOSE, CAPILLARY     Status: Normal   Collection Time   01/28/13 12:10 AM      Component Value Range Comment   Glucose-Capillary 86  70 - 99 mg/dL    Comment 1 Notify RN     LIPASE, BLOOD     Status: Abnormal   Collection Time   01/28/13  5:00 AM      Component Value Range Comment   Lipase 104 (*) 11 - 59 U/L   HEPATIC FUNCTION PANEL     Status: Abnormal   Collection Time   01/28/13  5:00 AM      Component Value Range Comment   Total Protein 6.4  6.0 - 8.3 g/dL    Albumin 2.9 (*) 3.5 - 5.2 g/dL    AST 51 (*) 0 - 37 U/L    ALT 76 (*) 0 - 35 U/L    Alkaline Phosphatase 151 (*) 39 - 117 U/L    Total Bilirubin 0.4  0.3 - 1.2 mg/dL    Bilirubin, Direct 0.1  0.0 - 0.3 mg/dL    Indirect Bilirubin 0.3  0.3 - 0.9 mg/dL   BASIC METABOLIC PANEL     Status: Abnormal   Collection Time   01/28/13  5:00 AM      Component Value Range Comment   Sodium 134 (*) 135 - 145 mEq/L    Potassium 4.1  3.5 - 5.1 mEq/L    Chloride 96  96 - 112 mEq/L    CO2 29  19 - 32 mEq/L    Glucose, Bld 98  70 - 99 mg/dL    BUN 9  6 - 23 mg/dL    Creatinine, Ser 6.57  0.50 - 1.10 mg/dL    Calcium 9.2  8.4 - 84.6 mg/dL    GFR calc non  Af Amer >90  >90 mL/min    GFR calc Af Amer >90  >90 mL/min   MAGNESIUM     Status: Normal   Collection Time   01/28/13  5:00 AM      Component Value Range Comment   Magnesium 2.1  1.5 - 2.5 mg/dL   PHOSPHORUS     Status: Normal   Collection Time   01/28/13  5:00 AM      Component Value Range Comment   Phosphorus 3.7  2.3 - 4.6 mg/dL   GLUCOSE, CAPILLARY     Status: Abnormal   Collection Time   01/28/13  6:27 AM      Component Value Range Comment   Glucose-Capillary 106 (*) 70 - 99 mg/dL    Comment 1 Notify RN     GLUCOSE, CAPILLARY  Status: Normal   Collection Time   01/28/13 12:20 PM      Component Value Range Comment   Glucose-Capillary 95  70 - 99 mg/dL    Comment 1 Notify RN     GLUCOSE, CAPILLARY     Status: Normal   Collection Time   01/28/13  5:19 PM      Component Value Range Comment   Glucose-Capillary 92  70 - 99 mg/dL   GLUCOSE, CAPILLARY     Status: Normal   Collection Time   01/28/13 11:01 PM      Component Value Range Comment   Glucose-Capillary 98  70 - 99 mg/dL    Comment 1 Notify RN     COMPREHENSIVE METABOLIC PANEL     Status: Abnormal   Collection Time   01/29/13  5:00 AM      Component Value Range Comment   Sodium 138  135 - 145 mEq/L    Potassium 3.4 (*) 3.5 - 5.1 mEq/L    Chloride 99  96 - 112 mEq/L    CO2 28  19 - 32 mEq/L    Glucose, Bld 113 (*) 70 - 99 mg/dL    BUN 9  6 - 23 mg/dL    Creatinine, Ser 1.61  0.50 - 1.10 mg/dL    Calcium 9.0  8.4 - 09.6 mg/dL    Total Protein 6.1  6.0 - 8.3 g/dL    Albumin 2.9 (*) 3.5 - 5.2 g/dL    AST 24  0 - 37 U/L    ALT 52 (*) 0 - 35 U/L    Alkaline Phosphatase 124 (*) 39 - 117 U/L    Total Bilirubin 0.4  0.3 - 1.2 mg/dL    GFR calc non Af Amer >90  >90 mL/min    GFR calc Af Amer >90  >90 mL/min   LIPASE, BLOOD     Status: Abnormal   Collection Time   01/29/13  5:00 AM      Component Value Range Comment   Lipase 97 (*) 11 - 59 U/L   MAGNESIUM     Status: Normal   Collection Time   01/29/13  5:00 AM       Component Value Range Comment   Magnesium 2.0  1.5 - 2.5 mg/dL   PHOSPHORUS     Status: Normal   Collection Time   01/29/13  5:00 AM      Component Value Range Comment   Phosphorus 3.6  2.3 - 4.6 mg/dL   CBC     Status: Normal   Collection Time   01/29/13  5:00 AM      Component Value Range Comment   WBC 5.4  4.0 - 10.5 K/uL    RBC 4.36  3.87 - 5.11 MIL/uL    Hemoglobin 12.6  12.0 - 15.0 g/dL    HCT 04.5  40.9 - 81.1 %    MCV 89.0  78.0 - 100.0 fL    MCH 28.9  26.0 - 34.0 pg    MCHC 32.5  30.0 - 36.0 g/dL    RDW 91.4  78.2 - 95.6 %    Platelets 214  150 - 400 K/uL   DIFFERENTIAL     Status: Abnormal   Collection Time   01/29/13  5:00 AM      Component Value Range Comment   Neutrophils Relative 56  43 - 77 %    Neutro Abs 3.0  1.7 - 7.7 K/uL  Lymphocytes Relative 26  12 - 46 %    Lymphs Abs 1.4  0.7 - 4.0 K/uL    Monocytes Relative 9  3 - 12 %    Monocytes Absolute 0.5  0.1 - 1.0 K/uL    Eosinophils Relative 8 (*) 0 - 5 %    Eosinophils Absolute 0.4  0.0 - 0.7 K/uL    Basophils Relative 1  0 - 1 %    Basophils Absolute 0.0  0.0 - 0.1 K/uL   CHOLESTEROL, TOTAL     Status: Normal   Collection Time   01/29/13  5:00 AM      Component Value Range Comment   Cholesterol 158  0 - 200 mg/dL   TRIGLYCERIDES     Status: Abnormal   Collection Time   01/29/13  5:00 AM      Component Value Range Comment   Triglycerides 153 (*) <150 mg/dL   GLUCOSE, CAPILLARY     Status: Abnormal   Collection Time   01/29/13  6:39 AM      Component Value Range Comment   Glucose-Capillary 118 (*) 70 - 99 mg/dL     Radiology/Results: Ct Abdomen Pelvis W Contrast  01/27/2013  *RADIOLOGY REPORT*  Clinical Data: Status post cholecystectomy with postop choledocholithiasis.  Gallstone pancreatitis with known pseudocysts.  CT ABDOMEN AND PELVIS WITH CONTRAST  Technique:  Multidetector CT imaging of the abdomen and pelvis was performed following the standard protocol during bolus administration of intravenous  contrast.  Contrast:  100 ml Omnipaque-300 IV  Comparison: 01/15/2013  Findings: Linear scarring versus atelectasis at the left lung base. Trace left pleural effusion, improved.  Liver is within normal limits.  Spleen is notable for small volume perisplenic fluid, likely reflecting a pseudocyst, improved.  In addition, at least four distinct peripancreatic pseudocysts are present: --9.4 x 4.9 cm collection adjacent to the pancreatic tail (series 2/image 22), previously 10.9 x 5.6 cm --4.7 x 7.8 cm collection anterior to the pancreatic body (series 2/image 32), previously well defined, measuring 5.8 x 9.0 cm --2.9 x 2.5 cm collection inferior to the pancreatic head (series 2/image 46), previously 3.5 x 2.9 cm --4.7 x 4.0 cm collection in the right mid abdomen (series 2/image 5), previously 4.9 x 4.9 cm  While all of these collections have a thin rim, compatible with pseudocysts, none of them demonstrate a thickened rim to suggest abscess.  No evidence of pancreatic necrosis.  Status post cholecystectomy.  No intrahepatic ductal dilatation. Stable common bile duct, measuring up to 2.0 cm in diameter, but tapering at the ampulla.  Left adrenal gland is mildly nodular/thickened, unchanged.  Right adrenal gland is unremarkable.  Kidneys are within normal limits.  No hydronephrosis.  No evidence of bowel obstruction.  Large abdominal hernia containing multiple loops of small and large bowel, unchanged. Colonic diverticulosis, without associated inflammatory changes.  Atherosclerotic calcifications of the abdominal aorta and branch vessels.  No abdominopelvic ascites.  No suspicious abdominopelvic lymphadenopathy.  Status post hysterectomy.  Bilateral ovaries are unremarkable.  Bladder is within normal limits.  Degenerative changes of the lumbar spine.  Status post PLIF from L2- 4.  Status post ALIF at L4-5 and L5-S1.  IMPRESSION: Multiple peripancreatic and perisplenic pseudocysts, as described above, mildly improved.  No  evidence of abscess.  Status post cholecystectomy.  Stable common bile duct.  No intrahepatic ductal dilatation.  Large abdominal hernia containing multiple loops of small and large bowel.  No evidence of bowel obstruction.   Original  Report Authenticated By: Charline Bills, M.D.     Anti-infectives: Anti-infectives    None      Assessment/Plan: Problem List: Patient Active Problem List  Diagnosis  . OBESITY  . ALLERGIC RHINITIS  . DEGENERATIVE DISC DISEASE  . HEADACHE, CHRONIC  . DYSPNEA  . COUGH, CHRONIC  . HYSTERECTOMY, HX OF  . Gallstone pancreatitis  . Common biliary duct obstruction  . Leukocytosis  . Acute respiratory failure: hypoxic and hypercarbic  . Atelectasis  . Pleural effusion  . Hypotension  . Choledocholithiasis  . Abdominal pain  . Muscle spasm of back    Retained CBD stone with recurrent pancreatitis for ERCP today.  * No surgery date entered *    LOS: 3 days   Matt B. Daphine Deutscher, MD, [facility] [phone] beeper [phone]  01/29/2013 8:55 AM

## 2013-01-29 NOTE — Transfer of Care (Signed)
Immediate Anesthesia Transfer of Care Note  Patient: [name] [name]  Procedure(s) Performed: Procedure(s) (LRB): ESOPHAGEAL ENDOSCOPIC ULTRASOUND (EUS) RADIAL (N/A)  Patient Location: PACU  Anesthesia Type: MAC  Level of Consciousness: sedated, patient cooperative and responds to stimulaton  Airway & Oxygen Therapy: Patient Spontanous Breathing and Patient connected to face mask oxgen  Post-op Assessment: Report given to PACU RN and Post -op Vital signs reviewed and stable  Post vital signs: Reviewed and stable  Complications: No apparent anesthesia complications

## 2013-01-29 NOTE — Anesthesia Preprocedure Evaluation (Signed)
Anesthesia Evaluation  Patient identified by MRN, date of birth, ID band Patient awake    Reviewed: Allergy & Precautions, H&P , NPO status , Patient's Chart, lab work & pertinent test results, reviewed documented beta blocker date and time   History of Anesthesia Complications Negative for: history of anesthetic complications  Airway Mallampati: I TM Distance: >3 FB Neck ROM: Full    Dental  (+) Edentulous Upper and Dental Advisory Given   Pulmonary shortness of breath and at rest, asthma , pneumonia -, resolved,  breath sounds clear to auscultation  Pulmonary exam normal       Cardiovascular negative cardio ROS  Rhythm:Regular     Neuro/Psych  Headaches,  Neuromuscular disease    GI/Hepatic negative GI ROS, Pancreatitis   Endo/Other  Morbid obesity  Renal/GU negative Renal ROS     Musculoskeletal   Abdominal (+)  Abdomen: soft. Bowel sounds: normal.  Peds  Hematology   Anesthesia Other Findings   Reproductive/Obstetrics                           Anesthesia Physical Anesthesia Plan  ASA: III  Anesthesia Plan: MAC   Post-op Pain Management:    Induction:   Airway Management Planned: Nasal Cannula  Additional Equipment:   Intra-op Plan:   Post-operative Plan:   Informed Consent: I have reviewed the patients History and Physical, chart, labs and discussed the procedure including the risks, benefits and alternatives for the proposed anesthesia with the patient or authorized representative who has indicated his/her understanding and acceptance.   Dental advisory given  Plan Discussed with:   Anesthesia Plan Comments:         Anesthesia Quick Evaluation

## 2013-01-29 NOTE — Preoperative (Signed)
Beta Blockers   Reason not to administer Beta Blockers:Not Applicable 

## 2013-01-29 NOTE — Anesthesia Postprocedure Evaluation (Signed)
Anesthesia Post Note  Patient: [name] [name]  Procedure(s) Performed: Procedure(s) (LRB): ESOPHAGEAL ENDOSCOPIC ULTRASOUND (EUS) RADIAL (N/A)  Anesthesia type: MAC  Patient location: PACU  Post pain: Pain level controlled  Post assessment: Post-op Vital signs reviewed  Last Vitals:  Filed Vitals:   01/29/13 1040  BP: 111/70  Pulse:   Temp:   Resp: 20    Post vital signs: Reviewed  Level of consciousness: sedated  Complications: No apparent anesthesia complications

## 2013-01-29 NOTE — Progress Notes (Signed)
[facility] PROGRESS NOTE  [name] [name]:[id number] DOB: 1956-06-06 DOA: 01/26/2013 PCP: Kaleen Mask, MD  Brief narrative  57 year-old female who was recently discharged after being treated for gallstone pancreatitis , had laparoscopic cholecystectomy and cholangiogram showing possibility of choledocholithiasis. MRCP was unable to be done due to the patient's weight. EUS was planned but not done and eventually planned to be done as outpatient. Patient had a CT scan of the abdomen which showed pseudocyst formation. Patient discharge home felt better for few days however her epigastric pain recurred one day prior to admission.   Assessment/Plan:  Abdominal pain  - in the setting of recent gallstone pancreatitis with possible choledocholithiasis on cholangiogram.  -Mild transaminitis and elevated lipase.  -A PICC line has been placed for poor IV access. The CT of the abdomen shows multiple pancreatic pseudocyst however appears to be improved compared to previous imaging . Continue with pain control and IV fluids.  -Seen by [facility]. EUS done today showing scattered peripancreatic edema and cystic collections. No obvious mass noted. No evidence of bile stones or thickening noted with normal ampulla. MRCP not done. -GI recommend supportive care for pancreatitis. Surgery following and have made patient n.p.o. and started on TPN. Will decide on the need for pseudocyst drainage. -Continue antiemetic for nausea.   Back spasms  On by mouth Robaxin  Hypokalemia Mild Replenish on TPN  DVT prophylaxis   Code Status: Full  Family Communication: None at bedside  Disposition Plan: Home once stable   Consultants:  [facility] [place] surgery Procedures:  CT of the abdomen  EUS   Antibiotics:  None  HPI/Subjective: Still has some epigastric pain. No nausea vomiting. Patient made n.p.o. and started on TPN by surgery. US done today showing  scattered peripancreatic edema and  cystic collections. No bile duct stones noted.  Objective: Filed Vitals:   01/29/13 1044 01/29/13 1050 01/29/13 1100 01/29/13 1110  BP: 111/70 112/71 112/71 120/61  Pulse:      Temp: 98.2 F (36.8 C)     TempSrc:      Resp: 15 13 12 14   Height:      Weight:      SpO2: 96% 96% 98% 96%    Intake/Output Summary (Last 24 hours) at 01/29/13 1318 Last data filed at 01/29/13 1040  Gross per 24 hour  Intake 3297.83 ml  Output   1050 ml  Net 2247.83 ml   Filed Weights   01/26/13 1650 01/26/13 2328  Weight: 117.935 kg (260 lb) 118.616 kg (261 lb 8 oz)    Exam:   General:  He lives alone his female lying in bed in no acute distress  HEENT: No pallor, moist oral mucosa  Cardiovascular: Normal S1 and S2, no murmurs rub or gallop  Respiratory: Clear to auscultation bilaterally, no added sounds  Abdomen: Soft, epigastric tenderness,  bowel sounds present  Extremities: Warm, no edema,   CNS: AAO x3  Data Reviewed: Basic Metabolic Panel:  Lab 01/29/13 8119 01/28/13 0500 01/27/13 0511 01/26/13 1750  NA 138 134* 138 137  K 3.4* 4.1 4.9 4.0  CL 99 96 99 99  CO2 28 29 32 28  GLUCOSE 113* 98 108* 126*  BUN 9 9 13 12   CREATININE 0.57 0.65 0.77 0.60  CALCIUM 9.0 9.2 9.7 9.9  MG 2.0 2.1 -- --  PHOS 3.6 3.7 -- --   Liver Function Tests:  Lab 01/29/13 0500 01/28/13 0500 01/27/13 0511 01/26/13 1750  AST 24 51*  98* 117*  ALT 52* 76* 97* 61*  ALKPHOS 124* 151* 157* 133*  BILITOT 0.4 0.4 0.4 0.8  PROT 6.1 6.4 6.9 7.0  ALBUMIN 2.9* 2.9* 3.1* 3.2*    Lab 01/29/13 0500 01/28/13 0500 01/27/13 0511 01/26/13 1750  LIPASE 97* 104* 195* 277*  AMYLASE -- -- -- 226*   No results found for this basename: AMMONIA:5 in the last 168 hours CBC:  Lab 01/29/13 0500 01/27/13 0511 01/26/13 1750  WBC 5.4 7.3 12.3*  NEUTROABS 3.0 -- 10.0*  HGB 12.6 13.5 14.6  HCT 38.8 42.4 43.9  MCV 89.0 89.6 88.2  PLT 214 301 293   Cardiac Enzymes: No results found for this basename:  CKTOTAL:5,CKMB:5,CKMBINDEX:5,TROPONINI:5 in the last 168 hours BNP (last 3 results) No results found for this basename: PROBNP:3 in the last 8760 hours CBG:  Lab 01/29/13 1141 01/29/13 0639 01/28/13 2301 01/28/13 1719 01/28/13 1220  GLUCAP 104* 118* 98 92 95    No results found for this or any previous visit (from the past 240 hour(s)).   Studies: Ct Abdomen Pelvis W Contrast  01/27/2013  *RADIOLOGY REPORT*  Clinical Data: Status post cholecystectomy with postop choledocholithiasis.  Gallstone pancreatitis with known pseudocysts.  CT ABDOMEN AND PELVIS WITH CONTRAST  Technique:  Multidetector CT imaging of the abdomen and pelvis was performed following the standard protocol during bolus administration of intravenous contrast.  Contrast:  100 ml Omnipaque-300 IV  Comparison: 01/15/2013  Findings: Linear scarring versus atelectasis at the left lung base. Trace left pleural effusion, improved.  Liver is within normal limits.  Spleen is notable for small volume perisplenic fluid, likely reflecting a pseudocyst, improved.  In addition, at least four distinct peripancreatic pseudocysts are present: --9.4 x 4.9 cm collection adjacent to the pancreatic tail (series 2/image 22), previously 10.9 x 5.6 cm --4.7 x 7.8 cm collection anterior to the pancreatic body (series 2/image 32), previously well defined, measuring 5.8 x 9.0 cm --2.9 x 2.5 cm collection inferior to the pancreatic head (series 2/image 46), previously 3.5 x 2.9 cm --4.7 x 4.0 cm collection in the right mid abdomen (series 2/image 5), previously 4.9 x 4.9 cm  While all of these collections have a thin rim, compatible with pseudocysts, none of them demonstrate a thickened rim to suggest abscess.  No evidence of pancreatic necrosis.  Status post cholecystectomy.  No intrahepatic ductal dilatation. Stable common bile duct, measuring up to 2.0 cm in diameter, but tapering at the ampulla.  Left adrenal gland is mildly nodular/thickened, unchanged.  Right  adrenal gland is unremarkable.  Kidneys are within normal limits.  No hydronephrosis.  No evidence of bowel obstruction.  Large abdominal hernia containing multiple loops of small and large bowel, unchanged. Colonic diverticulosis, without associated inflammatory changes.  Atherosclerotic calcifications of the abdominal aorta and branch vessels.  No abdominopelvic ascites.  No suspicious abdominopelvic lymphadenopathy.  Status post hysterectomy.  Bilateral ovaries are unremarkable.  Bladder is within normal limits.  Degenerative changes of the lumbar spine.  Status post PLIF from L2- 4.  Status post ALIF at L4-5 and L5-S1.  IMPRESSION: Multiple peripancreatic and perisplenic pseudocysts, as described above, mildly improved.  No evidence of abscess.  Status post cholecystectomy.  Stable common bile duct.  No intrahepatic ductal dilatation.  Large abdominal hernia containing multiple loops of small and large bowel.  No evidence of bowel obstruction.   Original Report Authenticated By: Charline Bills, M.D.     Scheduled Meds:   . insulin aspart  0-9 Units  Subcutaneous Q6H  . lipase/protease/amylase  2 capsule Oral TID AC  . pantoprazole (PROTONIX) IV  40 mg Intravenous Q24H  . sodium chloride  10-40 mL Intracatheter Q12H  . sodium chloride  3 mL Intravenous Q12H   Continuous Infusions:   . sodium chloride    . TPN (CLINIMIX) +/- additives     And  . fat emulsion    . lactated ringers    . TPN (CLINIMIX) +/- additives 40 mL/hr at 01/29/13 0608      Time spent: 25 minutes    Eddie North  [facility] Pager [phone] If 8PM-8AM, please contact night-coverage at [email or web address], password [facility] 01/29/2013, 1:18 PM  LOS: 3 days

## 2013-01-29 NOTE — Op Note (Signed)
[facility] [address] [place], [address]   ENDOSCOPIC ULTRASOUND PROCEDURE REPORT  PATIENT: [name] [name], [name] [name]  MR#: [id number] BIRTHDATE: May 04, 1956  GENDER: Female ENDOSCOPIST: Willis Modena, MD REFERRED BY:  [facility] PROCEDURE DATE:  01/29/2013 PROCEDURE:   Upper EUS ASA CLASS:      Class III INDICATIONS:   1.  abdominal pain, pancreatitis, elevated LFTs, abnormal IOC. MEDICATIONS: Cetacaine spray x 2 and MAC sedation, administered by CRNA  DESCRIPTION OF PROCEDURE:   After the risks benefits and alternatives of the procedure were  explained, informed consent was obtained. The patient was then placed in the left, lateral, decubitus postion and IV sedation was administered. Throughout the procedure, the patients blood pressure, pulse and oxygen saturations were monitored continuously.  Under direct visualization, the Pentax Radial EUS [id number]  endoscope was introduced through the mouth  and advanced to the second portion of the duodenum .  Water was used as necessary to provide an acoustic interface.  Upon completion of the imaging, water was removed and the patient was sent to the recovery room in satisfactory condition.    FINDINGS:      Extensive changes of pancreatitis (hard to discern acute versus chronic), with scattered peripancreatic edema and cystic collections.   No obvious mass, but this could not be readily discerned in setting of such extensive pancreatitis.  Bile duct is about 10mm maximally dilated; there is no evidence of bile duct stones or bile duct wall thickening.  Ampulla normal via EUS.   IMPRESSION:     Extensive changes of pancreatitis.  No evidence of bile duct stones.  RECOMMENDATIONS:     1.  Watch for potential complications of procedure. 2.  Supportive management of pancreatitis. 3.  No need for ERCP. 4.  [facility] inpatient team to follow.   _______________________________ Rosalie DoctorWillis Modena, MD  01/29/2013 10:54 AM   CC:

## 2013-01-29 NOTE — Clinical Documentation Improvement (Signed)
[facility] DOCUMENTATION CLARIFICATION  THIS DOCUMENT IS NOT A PERMANENT PART OF THE MEDICAL RECORD  TO RESPOND TO THE THIS QUERY, FOLLOW THE INSTRUCTIONS BELOW:  1. If needed, update documentation for the patient's encounter via the notes activity.  2. Access this query again and click edit on the In [employer].  3. After updating, or not, click F2 to complete all highlighted (required) fields concerning your review. Select "additional documentation in the medical record" OR "no additional documentation provided".  4. Click Sign note button.  5. The deficiency will fall out of your In Basket *Please let us know if you are not able to complete this workflow by phone or e-mail (listed below).  Please update your documentation within the medical record to reflect your response to this query.                                                                                        01/29/13   Dear Dr. [facility],  In a better effort to capture your patient's severity of illness, reflect appropriate length of stay and utilization of resources, a review of the patient medical record has revealed the following indicators.    Based on your clinical judgment, please clarify and document in a progress note and/or discharge summary the clinical condition associated with the following supporting information:  In responding to this query please exercise your independent judgment.  The fact that a query is asked, does not imply that any particular answer is desired or expected.  01/28/13 Nutr Eval noted w/ nutrition documentation criteria for "-Severe malnutrition in the context of acute illness or injury -Morbid Obesity"... For accurate Dx severity & specificity please help validate nutr documentation for clinical cond being eval'd, mon'd & tx'd. Thank you    Possible Clinical Conditions? . Mild Malnutrition  . Moderate Malnutrition . Severe Malnutrition   . Protein Calorie  Malnutrition . Severe Protein Calorie Malnutrition  . Emaciation  . Cachexia   Other Condition (please specify) Cannot Clinically Determine   Supporting Information: Risk Factors: See Nutrition Eval 01/28/13  Signs & Symptoms: See Nutrition Eval 01/28/13  -Diagnostics: See Nutrition Eval 01/28/13  Treatments: See Nutrition Eval 01/28/13 -Medications:  -Nutrition Consult: See Nutrition Eval 01/28/13   You may use possible, probable, or suspect with inpatient documentation. possible, probable, suspected diagnoses MUST be documented at the time of discharge  Reviewed:  no additional documentation provided; 02/02/13 chart rev'x 3, no add doc noted. orm  Thank You,  Toribio Harbour, RN, BSN, CCDS Certified Clinical Documentation Specialist Pager: [phone]  Health Information Management [employer]

## 2013-01-29 NOTE — Interval H&P Note (Signed)
History and Physical Interval Note:  01/29/2013 10:08 AM  [name] [name]  has presented today for surgery, with the diagnosis of possible CBD stones  The various methods of treatment have been discussed with the patient and family. After consideration of risks, benefits and other options for treatment, the patient has consented to  Procedure(s) (LRB) with comments: ESOPHAGEAL ENDOSCOPIC ULTRASOUND (EUS) RADIAL (N/A) as a surgical intervention .  The patient's history has been reviewed, patient examined, no change in status, stable for surgery.  I have reviewed the patient's chart and labs.  Questions were answered to the patient's satisfaction.     [name] [name] M  Assessment:  1.  Abdominal pain. 2.  Dilated bile duct. 3.  Mildly elevated LFTs.  Plan:  1.  Endoscopic ultrasound to better define whether or not patient has bile duct stone. 2.  Risks (bleeding, infection, bowel perforation that could require surgery, sedation-related changes in cardiopulmonary systems), benefits (identification and possible treatment of source of symptoms, exclusion of certain causes of symptoms), and alternatives (watchful waiting, radiographic imaging studies, empiric medical treatment) of upper endoscopy with ultrasound (EUS) were explained to patient/family in detail and patient wishes to proceed.  P

## 2013-01-30 ENCOUNTER — Encounter (HOSPITAL_COMMUNITY): Payer: Self-pay | Admitting: Gastroenterology

## 2013-01-30 ENCOUNTER — Inpatient Hospital Stay (HOSPITAL_COMMUNITY): Payer: Medicare Other

## 2013-01-30 LAB — BASIC METABOLIC PANEL
CO2: 29 mEq/L (ref 19–32)
Chloride: 101 mEq/L (ref 96–112)
Creatinine, Ser: 0.58 mg/dL (ref 0.50–1.10)
Potassium: 3.8 mEq/L (ref 3.5–5.1)

## 2013-01-30 LAB — GLUCOSE, CAPILLARY
Glucose-Capillary: 110 mg/dL — ABNORMAL HIGH (ref 70–99)
Glucose-Capillary: 115 mg/dL — ABNORMAL HIGH (ref 70–99)

## 2013-01-30 MED ORDER — METHOCARBAMOL 100 MG/ML IJ SOLN
1000.0000 mg | Freq: Three times a day (TID) | INTRAVENOUS | Status: DC | PRN
  Administered 2013-01-30 – 2013-02-01 (×4): 1000 mg via INTRAVENOUS
  Filled 2013-01-30 (×4): qty 10

## 2013-01-30 MED ORDER — TRACE MINERALS CR-CU-F-FE-I-MN-MO-SE-ZN IV SOLN
INTRAVENOUS | Status: DC
  Administered 2013-01-30: 18:00:00 via INTRAVENOUS
  Filled 2013-01-30: qty 2000

## 2013-01-30 NOTE — Progress Notes (Signed)
NGT placed for potential tube feedings. PT unable to tolerate. NGT removed and MD notified. Julio Sicks RN

## 2013-01-30 NOTE — Progress Notes (Signed)
Nutrition Brief Note   Intervention: - Once nasojejunal tube placed and ready for use, recommend Osmolite 1.2 with goal rate of 9ml/hr. This will provide 1872 calories, 87g protein, free water, meeting 110% estimated calorie needs, 102% estimated protein needs. If IVF d/c, recommend water flushes q6hr.  - TPN per pharmacy, will adjust and initiate TF when nasojejunal tube ready for use and as TPN weaned - RD to monitor for TF initiation and tolerance   Diet: NPO  TPN: Clinimix E 5/20 @ 60 ml/hr.  Lipids (20% IVFE @ 10 ml/hr), multivitamins, and trace elements are provided 3 times weekly (MWF) due to national backorder.  Provides 1473 kcal and 72 grams protein daily (based on weekly average).  Meets 87% minimum estimated kcal and 85% minimum estimated protein needs.  Additional IVF with NS @ 35 ml/hr.  Re-estimated pt's nutritional needs to be: 1700-2000 calories 85-95g protein  Pt had esophageal endoscopic ultrasound yesterday which showed extensive changes of pancreatitis without evidence of bile duct stones per MD notes. Pt with persistent abdominal pain. Lipase and ALT remain elevated but trending down. Triglycerides slightly elevated. Agree with GI recommendation for nasojejunal enteric feeds via dobhoff tube versus TPN. Plan is for possible placement of this tube today. Met with pt who reports slight improvement in abdominal pain and states that her nausea is under control with current medication regimen.    Levon Hedger MS, RD, LDN [phone] Pager [phone] After Hours Pager

## 2013-01-30 NOTE — Progress Notes (Signed)
Subjective: Abdominal pain persists.  Objective: Vital signs in last 24 hours: Temp:  [97.9 F (36.6 C)-98.4 F (36.9 C)] 98.1 F (36.7 C) (02/04 0613) Pulse Rate:  [63-71] 64  (02/04 0613) Resp:  [12-20] 18  (02/04 0613) BP: (107-120)/(61-79) 109/61 mmHg (02/04 0613) SpO2:  [94 %-98 %] 97 % (02/04 0613) Weight:  [119.5 kg (263 lb 7.2 oz)] 119.5 kg (263 lb 7.2 oz) (02/04 0613) Weight change:  Last BM Date: 01/28/13  PE: GEN:  Overweight, NAD ABD:  Soft, mild generalized tenderness, bowel sounds present  Lab Results: CBC    Component Value Date/Time   WBC 5.4 01/29/2013 0500   RBC 4.36 01/29/2013 0500   HGB 12.6 01/29/2013 0500   HCT 38.8 01/29/2013 0500   PLT 214 01/29/2013 0500   MCV 89.0 01/29/2013 0500   MCH 28.9 01/29/2013 0500   MCHC 32.5 01/29/2013 0500   RDW 13.3 01/29/2013 0500   LYMPHSABS 1.4 01/29/2013 0500   MONOABS 0.5 01/29/2013 0500   EOSABS 0.4 01/29/2013 0500   BASOSABS 0.0 01/29/2013 0500   CMP     Component Value Date/Time   NA 137 01/30/2013 0515   K 3.8 01/30/2013 0515   CL 101 01/30/2013 0515   CO2 29 01/30/2013 0515   GLUCOSE 156* 01/30/2013 0515   BUN 10 01/30/2013 0515   CREATININE 0.58 01/30/2013 0515   CALCIUM 8.8 01/30/2013 0515   PROT 6.1 01/29/2013 0500   ALBUMIN 2.9* 01/29/2013 0500   AST 24 01/29/2013 0500   ALT 52* 01/29/2013 0500   ALKPHOS 124* 01/29/2013 0500   BILITOT 0.4 01/29/2013 0500   GFRNONAA >90 01/30/2013 0515   GFRAA >90 01/30/2013 0515   Studies/Results: Endoscopic ultrasound:  No evidence of bile duct stones.  Assessment:  1.  Abdominal pain.  Likely in large part due to ongoing acute on chronic pancreatitis. 2.  Elevated LFTs, with no evidence of bile duct stones on yesterday's endoscopic ultrasounds. 3.  Prior pancreatitis with pseudocysts.  Plan:  1.  Patient needs nutrition, but is unable to gain much perorally (due to post-prandial pain).  Options in this situation include TPN (which she is on), but more ideally would be nasojejunal enteric feeds (via  dobhoff tube) due to decreased risk of line infection, further escalation of her elevated LFTs, among other reasons. 2. I suspect patient will need to be either on TPN or nasojejunal feeds for the next few weeks, and will likely not be able to tolerate much more than sips of clear liquids by mouth over this time frame. 3.  Another discharge-limiting step is pain control; might need to consider pain management consult (a service which I believe Palliative care provides, even in non-palliative care patients such as Mrs. Sabino Gasser). 4.  Patient likely would benefit from PT/OT consult, and might benefit from either home health services or even short-term rehabilitation stay at outside facility. 5.  Will follow.   Freddy Jaksch 01/30/2013, 7:53 AM

## 2013-01-30 NOTE — Progress Notes (Signed)
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  S/p cholecystecomy 1/16 with continued abdominal pain.  EUS performed 2/3 - no evidence of bile duct stones.  Pt reported 28 lb weight lost in last month. Pt unable to tolerate clear liquids - has not had any oral liquids since 2/2.  Noted GI, surgery, and [facility] MD all recommend nasojejunal enteric feeds > TPN.  Plan is for placement of PANDA tube today - if unable to place, then will continue TPN, though not optimal choice for this patient.    Insulin Requirements in the past 24 hours: 0 units  Nutritional Goals:  RD recs:  Kcal: 2000-2150 kcal, Protein: 110-120 g, Fluid: 2.0 L  Clinimix E5/20  at a goal rate of 83 ml/hr + IVFE 20% at 10 ml/hr on MWF to provide: 100 g/day protein, 1959 Kcal/day avg.(2233 Kcal/day MWF, 1753 Kcal/day STTHS).  Current Nutrition: NPO  IVF: NS @ 35 ml/hr  Assessment:   Electrolytes: WNL  LFTs:  Improving, Alk phos and ALT still slightly elevated  TGs: (2/3): 153 slightly elevated  Prealbumin: (2/3): 24 ok  Renal: SCr WNL, CrCl > 100 ml/min  Glucose: 98-156 -slightly elevated  Plan:   **F/u placement of nasojejunal PANDA tube.  If placed, will d/c TPN.  ** If unable to place, then at 1800 today:  Increase Clinimix E5/20 to goal 83 ml/hr (100g/day protein and 2233 Kcal/day MWF, 1753 Kcal/day STTHS(Avg. 1959 Kcal/day weekly))  Fat emulsion at 10 ml/hr(MWF only due to ongoing shortage)..  TNA to contain standard multivitamins and trace elements(MWF only due to ongoing shortage).  Maintain IVF @ 35 ml/hr.  Continue SSI q 4 h - f/u cBGs closely.    TNA lab panels on Mondays & Thursdays.  F/u daily.

## 2013-01-30 NOTE — Progress Notes (Signed)
[facility] PROGRESS NOTE  [name] [name] WUJ:[id number] DOB: [birth year] DOA: 01/26/2013 PCP: Kaleen Mask, MD  Brief narrative  57 year-old female who was recently discharged after being treated for gallstone pancreatitis , had laparoscopic cholecystectomy and cholangiogram showing possibility of choledocholithiasis. MRCP was unable to be done due to the patient's weight. EUS was planned but not done and eventually planned to be done as outpatient. Patient had a CT scan of the abdomen which showed pseudocyst formation. Patient discharge home felt better for few days however her epigastric pain recurred one day prior to admission.   Assessment/Plan:  Abdominal pain  - in the setting of recent gallstone pancreatitis with possible choledocholithiasis on cholangiogram.  -Mild transaminitis and elevated lipase.  -A PICC line has been placed for poor IV access. The CT of the abdomen shows multiple pancreatic pseudocyst however appears to be improved compared to previous imaging . Continue with pain control and IV fluids.  -Seen by [facility]. EUS done on 2/3 showing scattered peripancreatic edema and cystic collections. No obvious mass noted. No evidence of bile stones or thickening noted with normal ampulla. MRCP not done.  -GI recommend supportive care for pancreatitis. Surgery following and have made patient n.p.o. and started on TPN. Now Recommended for placing panda tube with enteral feeding. Patient agrees for it. IR consult placed. -no plans for surgical drainage of the cyst hoping it will heal on its own.  -Continue pain meds and antiemetic.  Back spasms  On IV  Robaxin . Switch to po once panda placed in.  Hypokalemia  Mild  Replenish on TPN   DVT prophylaxis  Code Status: Full  Family Communication: None at bedside  Disposition Plan: Home once pain stable with  TNA vs enteral feed    Consultants:  [facility]  [place] surgery Procedures:  CT of the abdomen   EUS Antibiotics:  None   HPI/Subjective: Has epigastric pain off and on. No nausea or vomiting.  Objective: Filed Vitals:   01/29/13 1321 01/29/13 2101 01/30/13 0613 01/30/13 1354  BP: 110/61 120/79 109/61 117/68  Pulse: 71 63 64 67  Temp: 97.9 F (36.6 C) 98.4 F (36.9 C) 98.1 F (36.7 C) 98.3 F (36.8 C)  TempSrc: Oral Oral Oral Oral  Resp: 16 18 18 17   Height:      Weight:   119.5 kg (263 lb 7.2 oz)   SpO2: 95% 98% 97% 98%    Intake/Output Summary (Last 24 hours) at 01/30/13 1407 Last data filed at 01/30/13 1100  Gross per 24 hour  Intake 2232.91 ml  Output   1675 ml  Net 557.91 ml   Filed Weights   01/26/13 1650 01/26/13 2328 01/30/13 0613  Weight: 117.935 kg (260 lb) 118.616 kg (261 lb 8 oz) 119.5 kg (263 lb 7.2 oz)    Exam:  General: He lives alone his female lying in bed in no acute distress  HEENT: No pallor, moist oral mucosa  Cardiovascular: Normal S1 and S2, no murmurs rub or gallop  Respiratory: Clear to auscultation bilaterally, no added sounds  Abdomen: Soft, epigastric tenderness, bowel sounds present  Extremities: Warm, no edema,  CNS: AAO x3  Data Reviewed: Basic Metabolic Panel:  Lab 01/30/13 9562 01/29/13 0500 01/28/13 0500 01/27/13 0511 01/26/13 1750  NA 137 138 134* 138 137  K 3.8 3.4* 4.1 4.9 4.0  CL 101 99 96 99 99  CO2 29 28 29  32 28  GLUCOSE 156* 113* 98 108* 126*  BUN 10  9 9 13 12   CREATININE 0.58 0.57 0.65 0.77 0.60  CALCIUM 8.8 9.0 9.2 9.7 9.9  MG -- 2.0 2.1 -- --  PHOS -- 3.6 3.7 -- --   Liver Function Tests:  Lab 01/29/13 0500 01/28/13 0500 01/27/13 0511 01/26/13 1750  AST 24 51* 98* 117*  ALT 52* 76* 97* 61*  ALKPHOS 124* 151* 157* 133*  BILITOT 0.4 0.4 0.4 0.8  PROT 6.1 6.4 6.9 7.0  ALBUMIN 2.9* 2.9* 3.1* 3.2*    Lab 01/29/13 0500 01/28/13 0500 01/27/13 0511 01/26/13 1750  LIPASE 97* 104* 195* 277*  AMYLASE -- -- -- 226*   No results found for this basename: AMMONIA:5 in the last 168 hours CBC:  Lab  01/29/13 0500 01/27/13 0511 01/26/13 1750  WBC 5.4 7.3 12.3*  NEUTROABS 3.0 -- 10.0*  HGB 12.6 13.5 14.6  HCT 38.8 42.4 43.9  MCV 89.0 89.6 88.2  PLT 214 301 293   Cardiac Enzymes: No results found for this basename: CKTOTAL:5,CKMB:5,CKMBINDEX:5,TROPONINI:5 in the last 168 hours BNP (last 3 results) No results found for this basename: PROBNP:3 in the last 8760 hours CBG:  Lab 01/30/13 1138 01/30/13 0627 01/30/13 0010 01/29/13 1717 01/29/13 1141  GLUCAP 115* 110* 113* 112* 104*    No results found for this or any previous visit (from the past 240 hour(s)).   Studies: No results found.  Scheduled Meds:   . insulin aspart  0-9 Units Subcutaneous Q6H  . lipase/protease/amylase  2 capsule Oral TID AC  . pantoprazole (PROTONIX) IV  40 mg Intravenous Q24H  . sodium chloride  10-40 mL Intracatheter Q12H  . sodium chloride  3 mL Intravenous Q12H   Continuous Infusions:   . sodium chloride 35 mL/hr at 01/29/13 2059  . TPN (CLINIMIX) +/- additives 60 mL/hr at 01/29/13 1730   And  . fat emulsion 250 mL (01/29/13 1729)  . lactated ringers        Time spent: 25 minutes    Danna Sewell  [facility] Pager [phone] If 8PM-8AM, please contact night-coverage at [email or web address], password [facility] 01/30/2013, 2:07 PM  LOS: 4 days

## 2013-01-30 NOTE — Progress Notes (Signed)
Patient ID: [name] [name], female   DOB: [birth year], 57 y.o.   MRN: [id number] 1 Day Post-Op  Subjective: Pt c/o LUQ abdominal pain.  Otherwise she's ok.  Objective: Vital signs in last 24 hours: Temp:  [97.9 F (36.6 C)-98.4 F (36.9 C)] 98.1 F (36.7 C) (02/04 7829) Pulse Rate:  [63-71] 64  (02/04 0613) Resp:  [12-20] 18  (02/04 0613) BP: (109-120)/(61-79) 109/61 mmHg (02/04 0613) SpO2:  [94 %-98 %] 97 % (02/04 0613) Weight:  [263 lb 7.2 oz (119.5 kg)] 263 lb 7.2 oz (119.5 kg) (02/04 0613) Last BM Date: 01/28/13  Intake/Output from previous day: 02/03 0701 - 02/04 0700 In: 4132.9 [I.V.:2653.1; IV Piggyback:115; TPN:1364.8] Out: 1475 [Urine:1475] Intake/Output this shift:    PE: Abd: soft, tender in epigastrium and LUQ.  Few BS, obese, ND  Lab Results:   Basename 01/29/13 0500  WBC 5.4  HGB 12.6  HCT 38.8  PLT 214   BMET  Basename 01/30/13 0515 01/29/13 0500  NA 137 138  K 3.8 3.4*  CL 101 99  CO2 29 28  GLUCOSE 156* 113*  BUN 10 9  CREATININE 0.58 0.57  CALCIUM 8.8 9.0   PT/INR No results found for this basename: LABPROT:2,INR:2 in the last 72 hours CMP     Component Value Date/Time   NA 137 01/30/2013 0515   K 3.8 01/30/2013 0515   CL 101 01/30/2013 0515   CO2 29 01/30/2013 0515   GLUCOSE 156* 01/30/2013 0515   BUN 10 01/30/2013 0515   CREATININE 0.58 01/30/2013 0515   CALCIUM 8.8 01/30/2013 0515   PROT 6.1 01/29/2013 0500   ALBUMIN 2.9* 01/29/2013 0500   AST 24 01/29/2013 0500   ALT 52* 01/29/2013 0500   ALKPHOS 124* 01/29/2013 0500   BILITOT 0.4 01/29/2013 0500   GFRNONAA >90 01/30/2013 0515   GFRAA >90 01/30/2013 0515   Lipase     Component Value Date/Time   LIPASE 97* 01/29/2013 0500       Studies/Results: No results found.  Anti-infectives: Anti-infectives    None       Assessment/Plan  1. Pancreatitis with pseudocysts s/p gallstone pancreatitis and lap chole  Plan: 1. I have spoken with Dr. Dulce Sellar, Dr. Gonzella Lex, and the patient who are all in agreeance  with placement of a PANDA tube for post pyloric feedings which is standard of care in this situation vs TNA.  If radiology is for some reason unable to do this then we will have no choice but to use TNA. 2. Right now these pseudocysts are not infected.  They will still need to mature before any type of intervention could be done; however, the hope would be that they would resolve prior to needing to proceed with any type of intervention. 3. Cont NPO 4. Will follow   LOS: 4 days    Aradia Estey E 01/30/2013, 10:13 AM Pager: [phone]

## 2013-01-30 NOTE — Progress Notes (Addendum)
Patient wanted to get up and walk in the hall. She thought her inactivity was making her back spasms worse. RN informed patient of bed rest privileges but she still wanted to walk. Patient tolerated well and got tired towards the end. Pt stated when she stands up, her hernia pulls a bit and can make it worse but other than that she enjoyed getting up to walk.

## 2013-01-31 ENCOUNTER — Inpatient Hospital Stay (HOSPITAL_COMMUNITY): Payer: Medicare Other

## 2013-01-31 DIAGNOSIS — R52 Pain, unspecified: Secondary | ICD-10-CM

## 2013-01-31 LAB — GLUCOSE, CAPILLARY
Glucose-Capillary: 135 mg/dL — ABNORMAL HIGH (ref 70–99)
Glucose-Capillary: 115 mg/dL — ABNORMAL HIGH (ref 70–99)

## 2013-01-31 MED ORDER — FAT EMULSION 20 % IV EMUL
250.0000 mL | INTRAVENOUS | Status: AC
  Administered 2013-01-31: 250 mL via INTRAVENOUS
  Filled 2013-01-31: qty 250

## 2013-01-31 MED ORDER — LORAZEPAM 0.5 MG PO TABS
0.5000 mg | ORAL_TABLET | Freq: Three times a day (TID) | ORAL | Status: DC | PRN
  Administered 2013-01-31 – 2013-02-04 (×7): 0.5 mg via ORAL
  Filled 2013-01-31 (×7): qty 1

## 2013-01-31 MED ORDER — OSMOLITE 1.2 CAL PO LIQD
1000.0000 mL | ORAL | Status: DC
  Filled 2013-01-31: qty 1000

## 2013-01-31 MED ORDER — M.V.I. ADULT IV INJ
INJECTION | INTRAVENOUS | Status: DC
  Administered 2013-01-31: 18:00:00 via INTRAVENOUS
  Filled 2013-01-31: qty 2000

## 2013-01-31 MED ORDER — TRACE MINERALS CR-CU-F-FE-I-MN-MO-SE-ZN IV SOLN
INTRAVENOUS | Status: AC
  Administered 2013-01-31: 18:00:00 via INTRAVENOUS
  Filled 2013-01-31: qty 2000

## 2013-01-31 NOTE — Progress Notes (Signed)
Patient ID: [name] [name], female   DOB: 1956/08/18, 57 y.o.   MRN: [id number] 2 Days Post-Op  Subjective: Pt feels ok this morning.  Frustrated with events of yesterday, having NGT placed when that was not the correct tube to be placed.  She now feels very anxious about potentially getting a PANDA tube.  She is unsure whether she wants to go through with this or not.  Objective: Vital signs in last 24 hours: Temp:  [97.8 F (36.6 C)-98.3 F (36.8 C)] 97.8 F (36.6 C) (02/05 0507) Pulse Rate:  [62-67] 62  (02/05 0507) Resp:  [17] 17  (02/04 1354) BP: (99-119)/(60-71) 99/60 mmHg (02/05 0507) SpO2:  [97 %-98 %] 98 % (02/05 0507) Last BM Date: 01/30/13 (pt says "smear")  Intake/Output from previous day: 02/04 0701 - 02/05 0700 In: 2679.4 [I.V.:822.5; IV Piggyback:60; TPN:1796.9] Out: 1750 [Urine:1750] Intake/Output this shift:    PE: Abd: soft, tender in LUQ, +BS, obese  Lab Results:   Basename 01/29/13 0500  WBC 5.4  HGB 12.6  HCT 38.8  PLT 214   BMET  Basename 01/30/13 0515 01/29/13 0500  NA 137 138  K 3.8 3.4*  CL 101 99  CO2 29 28  GLUCOSE 156* 113*  BUN 10 9  CREATININE 0.58 0.57  CALCIUM 8.8 9.0   PT/INR No results found for this basename: LABPROT:2,INR:2 in the last 72 hours CMP     Component Value Date/Time   NA 137 01/30/2013 0515   K 3.8 01/30/2013 0515   CL 101 01/30/2013 0515   CO2 29 01/30/2013 0515   GLUCOSE 156* 01/30/2013 0515   BUN 10 01/30/2013 0515   CREATININE 0.58 01/30/2013 0515   CALCIUM 8.8 01/30/2013 0515   PROT 6.1 01/29/2013 0500   ALBUMIN 2.9* 01/29/2013 0500   AST 24 01/29/2013 0500   ALT 52* 01/29/2013 0500   ALKPHOS 124* 01/29/2013 0500   BILITOT 0.4 01/29/2013 0500   GFRNONAA >90 01/30/2013 0515   GFRAA >90 01/30/2013 0515   Lipase     Component Value Date/Time   LIPASE 97* 01/29/2013 0500       Studies/Results: Dg Abd Portable 1v  01/30/2013  *RADIOLOGY REPORT*  Clinical Data: Nasogastric tube placement.  PORTABLE ABDOMEN - 1 VIEW  Comparison:  CT exam 01/27/2013  Findings: Nasogastric tube is in place, tip overlying the level of the distal stomach.  Bowel gas pattern is nonobstructive.  There is residual contrast in the colonic loops and rectosigmoid colon. Multiple colonic diverticula are present. Bowel loops overlie the left hip, consistent with abdominal wall laxity.  The patient has had previous spinal surgery.  Surgical clips are present in the right upper quadrant of the abdomen.  IMPRESSION: Nasogastric tube tip overlying the level of the distal stomach.   Original Report Authenticated By: Norva Pavlov, M.D.     Anti-infectives: Anti-infectives    None       Assessment/Plan  1. Pancreatitis with evolving pseudocysts 2. Morbid obesity  Plan: 1. I had a long discussion with the patient about the plan.  I let her know that I have spoken with GI and medicine and we are all on the same page as far as treatment plan.  She asked if she could stay on TNA and keep her PICC.  I let her know that is her option, but there are some major risks for staying on long term TNA such as liver dysfunction and infection of PICC, nevermind the enormous cost of TNA versus  placement of PANDA and tube feeds.  She is going to decide which way to pursue and let us know.   LOS: 5 days    Lamark Schue E 01/31/2013, 8:39 AM Pager: [phone]

## 2013-01-31 NOTE — Progress Notes (Signed)
Nutrition Brief Note  Intervention: - Once nasojejunal tube placed and ready for use, initiate Osmolite 1.2 at 87ml/hr which will provide 576 calories, 27g protein. This rate combined with current TPN rate will provide a total of 2083 calories, 99g protein, meeting around 100% of pt's estimated calorie/protein needs.  - RD to monitor TF initiation and tolerance and adjust TF rate as TPN weaned    Previous TPN order today:  Clinimix E 5/20 @ 83 ml/hr.  Lipids (20% IVFE @ 10 ml/hr), multivitamins, and trace elements are provided 3 times weekly (MWF) due to national backorder.  Provides 1959 kcal and 100 grams protein daily (based on weekly average).  Meets 115% minimum estimated kcal and 118% minimum estimated protein needs.  Additional IVF with NS @ 35 ml/hr.  TPN changed this afternoon to: Clinimix E 5/20 @ 60 ml/hr.  Lipids (20% IVFE @ 10 ml/hr), multivitamins, and trace elements are provided 3 times weekly (MWF) due to national backorder.  Provides 1507 kcal and 72 grams protein daily (based on weekly average).  Meets 89% minimum estimated kcal and 85% minimum estimated protein needs.  Pt scheduled to have nasojejunal tube placed this afternoon. Discussed nutrition with pharmacist who adjusted TPN according to what pt will be getting with TF initiated. Met with pt to discuss TF and answered her questions. Pt appreciative of visit.   Levon Hedger MS, RD, LDN [phone] Pager [phone] After Hours Pager

## 2013-01-31 NOTE — Progress Notes (Signed)
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  S/p cholecystecomy 1/16 with continued abdominal pain.  EUS performed 2/3 - no evidence of bile duct stones.  Pt reported 28 lb weight lost in last month. Pt unable to tolerate clear liquids - has not had any oral liquids since 2/2.  Noted GI, surgery, and [facility] MD all recommend nasojejunal enteric feeds > TPN.  Unfortunately, NGT placed mistakenly last night, then removed.  Plan today is for placement of PANDA tube today  IR at 1:30.  Will continue TPN until TFs are tolerated, then will wean off.     Insulin Requirements in the past 24 hours: 2units  Nutritional Goals:  Updated RD recommendations:  1700-2000 calories, 85-95g protein  Clinimix E5/20  at a goal rate of 83 ml/hr + IVFE 20% at 10 ml/hr on MWF to provide: 100 g/day protein, 1959 Kcal/day avg.(2233 Kcal/day MWF, 1753 Kcal/day STTHS).  Current Nutrition: NPO  IVF: NS @ 35 ml/hr  Assessment:   Electrolytes: WNL  LFTs:  Improving, Alk phos and ALT still slightly elevated  TGs: (2/3): 153 slightly elevated  Prealbumin: (2/3): 24 ok  Renal: SCr WNL, CrCl > 100 ml/min  Glucose: 98-156 -slightly elevated  Plan:   **F/u placement of nasojejunal PANDA tube and tolerance of TFs  Continue for Clinimix E5/20, but reduce rate to 60 ml/hr to avoid giving pt > 100% of protein and calorie needs from TNA and TFs together.    Fat emulsion at 10 ml/hr(MWF only due to ongoing shortage)..  TNA to contain standard multivitamins and trace elements(MWF only due to ongoing shortage).  F/u TF tolerance  Maintain IVF @ 35 ml/hr.  Continue SSI q 4 h - f/u cBGs closely.    TNA lab panels on Mondays & Thursdays.  F/u daily.

## 2013-01-31 NOTE — Progress Notes (Signed)
[facility] PROGRESS NOTE  [name] [name] LKG:[id number] DOB: December 27, 1956 DOA: 01/26/2013 PCP: Kaleen Mask, MD  Assessment/Plan: Subacute pancreatitis/pancreatic pseudocyst/abdominal pain -In the setting of recent gallstone pancreatitis -Endoscopy ultrasound 01/29/13 showing multiple pancreatic pseudocyst without any CBD obstruction--scattered peripancreatic edema and cystic collections. No obvious mass noted. No evidence of bile stones or thickening noted with normal ampulla -Appreciate GI followup -dobbhoff tube today, start TF thereafter -Continue intravenous hydromorphone for now -Necessity for Dobbhoff tube may preclude use of long-acting opioids due to the need of crushing the medications -Continue when necessary antiemetics -Increase activity -Nutrition will help assist in determining best tube feed regimen Hypokalemia -Replete -Check magnesium Back spasms -Convert to oral Robaxin once Dobbhoff tube was placed     Family Communication:   sister at beside Disposition Plan:   Home when medically stable     Procedures/Studies: Dg Cholangiogram Operative  01/11/2013  *RADIOLOGY REPORT*  Clinical Data:   Cholecystectomy.  INTRAOPERATIVE CHOLANGIOGRAM  Technique:  Cholangiographic images from the C-arm fluoroscopic device were submitted for interpretation post-operatively.  Please see the procedural report for the amount of contrast and the fluoroscopy time utilized.  Comparison:  Abdominal CT 01/06/2012  Findings:  The intrahepatic and extrahepatic bile ducts are dilated.  No contrast is identified within the duodenum.  There may be small filling defects within the common bile duct.  IMPRESSION: The biliary system is dilated and there is concern for a distal obstruction since no contrast is identified within the duodenum.  Small filling defects within the common bile duct may represent sludge or debris.   Original Report Authenticated By: Richarda Overlie, M.D.    Ct Angio  Chest Pe W/cm &/or Wo Cm  01/07/2013  *RADIOLOGY REPORT*  Clinical Data: Left-sided flank pain.  Pain with inspiration.  CT ANGIOGRAPHY CHEST  Technique:  Multidetector CT imaging of the chest using the standard protocol during bolus administration of intravenous contrast. Multiplanar reconstructed images including MIPs were obtained and reviewed to evaluate the vascular anatomy.  Contrast: 80mL OMNIPAQUE IOHEXOL 350 MG/ML SOLN  Comparison: Chest CT 09/03/2011.  Findings:  Mediastinum: Study is limited by large amount of patient respiratory motion.  With these limitations in mind, there is no evidence of central, lobar or proximal segmental sized pulmonary embolism.  Smaller distal segmental and subsegmental sized emboli cannot be completely excluded secondary to respiratory motion. Heart size is normal. There is no significant pericardial fluid, thickening or pericardial calcification.  No pathologically enlarged mediastinal or hilar lymph nodes. Esophagus is unremarkable in appearance.  Lungs/Pleura: Moderate left and trace right-sided pleural effusions.  Passive atelectasis in the dependent portion of the left lower lobe.  In addition, there is diffuse bronchial wall thickening with some thickening of the peribronchovascular interstitium and some patchy air space disease throughout the lungs bilaterally, predominately ground-glass in attenuation, suspicious for multifocal infection and/or inflammation.  This is relatively asymmetrically distributed.  Upper Abdomen: Perisplenic ascites.  Large amount of inflammatory changes in the retroperitoneum in the expected location of the tail of the pancreas, incompletely visualized.  Musculoskeletal: There are no aggressive appearing lytic or blastic lesions noted in the visualized portions of the skeleton.  IMPRESSION: 1.  Limited examination secondary to respiratory motion demonstrating no central, lobar or proximal segmental sized pulmonary embolism. 2.  The appearance  of the lung parenchyma, as above, suggests multifocal infection and/or inflammation.  Specifically, the findings are most concerning for early changes of ARDS related to underlying pancreatitis. 3.  Moderate left and trace right-sided  pleural effusions. 4.  Changes of pancreatitis incompletely visualized in the upper abdomen.   Original Report Authenticated By: Trudie Reed, M.D.    US Abdomen Complete  01/04/2013  *RADIOLOGY REPORT*  Clinical Data:  Abdominal pain.  Nausea and vomiting.  Elevated liver function test.  Elevated lipase.  COMPLETE ABDOMINAL ULTRASOUND  Comparison:  01/09/2009 CT.  Findings:  Gallbladder:  Multiple gallstones are present.  Largest measures 9 mm.  These demonstrate typical posterior acoustic shadowing and increased echogenicity. There is no wall thickening.  No sonographic Murphy's sign.  Common bile duct:  Enlarged, measuring between 12 mm and 13 mm. There is no common duct stone identified.  Liver:  Mild intrahepatic biliary ductal dilation is present.  No focal mass lesion.  IVC:  Appears normal.  Pancreas:  Enlargement of the pancreatic head.  Again, no common duct stone is identified in the intrapancreatic distal common bile duct.  Spleen:  8.6 cm.  Normal echotexture.  Right Kidney:  12.2 cm. Normal echotexture.  Normal central sinus echo complex.  No calculi or hydronephrosis.  Left Kidney:  12.3 cm. Normal echotexture.  Normal central sinus echo complex.  No calculi or hydronephrosis.  Abdominal aorta:  2.4 cm.  IMPRESSION: 1.  Cholelithiasis without findings of acute cholecystitis. 2.  Dilated common bile duct with mild intrahepatic biliary ductal dilation.  There is no visualized common duct stone however with intra and extrahepatic biliary ductal dilation; findings suspicious for a distal common duct stone.  In the setting of elevated lipase, gallstone pancreatitis is likely. 3.  Enlargement of pancreatic head compatible with pancreatitis.   Original Report Authenticated  By: Andreas Newport, M.D.    Ct Abdomen Pelvis W Contrast  01/27/2013  *RADIOLOGY REPORT*  Clinical Data: Status post cholecystectomy with postop choledocholithiasis.  Gallstone pancreatitis with known pseudocysts.  CT ABDOMEN AND PELVIS WITH CONTRAST  Technique:  Multidetector CT imaging of the abdomen and pelvis was performed following the standard protocol during bolus administration of intravenous contrast.  Contrast:  100 ml Omnipaque-300 IV  Comparison: 01/15/2013  Findings: Linear scarring versus atelectasis at the left lung base. Trace left pleural effusion, improved.  Liver is within normal limits.  Spleen is notable for small volume perisplenic fluid, likely reflecting a pseudocyst, improved.  In addition, at least four distinct peripancreatic pseudocysts are present: --9.4 x 4.9 cm collection adjacent to the pancreatic tail (series 2/image 22), previously 10.9 x 5.6 cm --4.7 x 7.8 cm collection anterior to the pancreatic body (series 2/image 32), previously well defined, measuring 5.8 x 9.0 cm --2.9 x 2.5 cm collection inferior to the pancreatic head (series 2/image 46), previously 3.5 x 2.9 cm --4.7 x 4.0 cm collection in the right mid abdomen (series 2/image 5), previously 4.9 x 4.9 cm  While all of these collections have a thin rim, compatible with pseudocysts, none of them demonstrate a thickened rim to suggest abscess.  No evidence of pancreatic necrosis.  Status post cholecystectomy.  No intrahepatic ductal dilatation. Stable common bile duct, measuring up to 2.0 cm in diameter, but tapering at the ampulla.  Left adrenal gland is mildly nodular/thickened, unchanged.  Right adrenal gland is unremarkable.  Kidneys are within normal limits.  No hydronephrosis.  No evidence of bowel obstruction.  Large abdominal hernia containing multiple loops of small and large bowel, unchanged. Colonic diverticulosis, without associated inflammatory changes.  Atherosclerotic calcifications of the abdominal aorta and  branch vessels.  No abdominopelvic ascites.  No suspicious abdominopelvic lymphadenopathy.  Status post  hysterectomy.  Bilateral ovaries are unremarkable.  Bladder is within normal limits.  Degenerative changes of the lumbar spine.  Status post PLIF from L2- 4.  Status post ALIF at L4-5 and L5-S1.  IMPRESSION: Multiple peripancreatic and perisplenic pseudocysts, as described above, mildly improved.  No evidence of abscess.  Status post cholecystectomy.  Stable common bile duct.  No intrahepatic ductal dilatation.  Large abdominal hernia containing multiple loops of small and large bowel.  No evidence of bowel obstruction.   Original Report Authenticated By: Charline Bills, M.D.    Ct Abdomen Pelvis W Contrast  01/15/2013  *RADIOLOGY REPORT*  Clinical Data: Acute pancreatitis.  CT ABDOMEN AND PELVIS WITH CONTRAST  Technique:  Multidetector CT imaging of the abdomen and pelvis was performed following the standard protocol during bolus administration of intravenous contrast.  Contrast: OMNIPAQUE IOHEXOL 300 MG/ML  SOLN  Comparison: 01/05/2013.  Findings: The lung bases demonstrate bilateral pleural effusions, left greater than right with overlying atelectasis.  These are new.  The liver demonstrates stable intrahepatic biliary dilatation.  No focal hepatic lesions.  The gallbladder is surgically absent. Stable common bile duct dilatation.  The spleen is mildly enlarged but but no focal lesions.  There is a large complex fluid collection surrounding the spleen with a thin enhancing membrane. This is most likely a pseudocyst.  There are also multiple complex pseudocyst surrounding the pancreas and in the splenic hilum and continuing down into the small bowel mesentery.  The pancreas demonstrates normal enhancement.  No findings to suggest pancreatic necrosis.  No hemorrhage or pseudoaneurysm.  The pancreatic duct is normal in caliber.  The adrenal glands and kidneys are unremarkable.  The stomach, duodenum,  small bowel and colon grossly normal.  No findings for small bowel obstruction.  There is a large abdominal wall hernia containing small bowel and colon but no findings for obstruction/ incarceration.  There is extensive subcutaneous soft tissue swelling/edema which could suggest anasarca or cellulitis.  The bladder is unremarkable.  No pelvic mass or significant free pelvic fluid collection.  Surgical changes involving the lumbar spine are again demonstrated.  IMPRESSION:  1.  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 2.  No definite findings for abscess and no evidence of pancreatic necrosis. 3.  New bilateral pleural effusions, left greater than right with overlying atelectasis. 4.  Stable biliary dilatation. 5.  Large abdominal wall hernia. 6.  New subcutaneous edema could suggest anasarca or cellulitis.   Original Report Authenticated By: Rudie Meyer, M.D.    Ct Abdomen Pelvis W Contrast  01/05/2013  *RADIOLOGY REPORT*  Clinical Data: Abdominal pain.  Nausea and vomiting.  CT ABDOMEN AND PELVIS WITH CONTRAST  Technique:  Multidetector CT imaging of the abdomen and pelvis was performed following the standard protocol during bolus administration of intravenous contrast.  Contrast: OMNIPAQUE IOHEXOL 300 MG/ML  SOLN  Comparison: CT of the abdomen and pelvis 01/09/2009.  Findings:  Lung Bases: Unremarkable.  Abdomen/Pelvis:  Gallbladder appears moderately distended and there is some pericholecystic fluid and stranding.  Mild intrahepatic biliary ductal dilatation.  Common bile duct also appears dilated measuring up to 15 mm in diameter in the porta hepatis.  No definite radiopaque stone is identified within the common bile duct, or within the lumen of the gallbladder.  The appearance of the liver is otherwise unremarkable.  Peripancreatic stranding is noted diffusely, suggesting pancreatitis.  There is a small amount of fluid  surrounding the spleen.  The adrenal glands and kidneys are unremarkable in appearance bilaterally.  Atherosclerosis throughout the abdominal and pelvic vasculature, without definite aneurysm or dissection.  Numerous colonic diverticula are noted, without definite surrounding inflammatory changes to strongly suggest acute diverticulitis at this time.  The patient has a large ventral hernia inferiorly which contains a portion of the colon and multiple loops of small bowel.  No definite signs to suggest bowel incarceration or obstruction at this time.  Low-lying rectum well below the level of the pubococcygeal line, which could suggest rectal prolapse.  Status post hysterectomy.  Ovaries are atrophic. The urinary bladder is unremarkable in appearance.  Musculoskeletal: There are no aggressive appearing lytic or blastic lesions noted in the visualized portions of the skeleton. Anterior lumbar fixation at L4-L5 and L5-S1.  Status post laminectomy at L2, L3-L4 with PLIF from L2-L4.  Interbody grafts are present at L2-L3, L3-L4, L4-L5 and L5 - S1.  5 mm of retrolisthesis of L1 upon L2 is noted.  Alignment is otherwise anatomic.  IMPRESSION: 1.  Findings, as above, concerning for a the distal obstruction of the common bile duct, and possibly the pancreatic duct, with acute pancreatitis and possible acute cholecystitis. This could represent a stricture, recently passed a ductal stone, or a nonradiopaque stone in the distal common bile duct.  Clinical correlation is recommended. 2.  Extensive colonic diverticulosis without findings to suggest acute diverticulitis at this time.  3.  Large inferior ventral hernia containing portions of the colon and small bowel, without signs to suggest bowel obstruction at this time. 4.  Atherosclerosis. 5.  Extensive postoperative changes in the spine, as above, with 5 mm of retrolisthesis of L1 upon L2. 6.  Findings suggestive of rectal prolapse.  Clinical correlation may be warranted.    Original Report Authenticated By: Trudie Reed, M.D.    Dg Chest Port 1 View  01/10/2013  *RADIOLOGY REPORT*  Clinical Data: Follow-up  PORTABLE CHEST - 1 VIEW  Comparison: 01/09/2013  Findings: .  Lung volumes remain low.  To cardiac collapse / consolidation persists with left pleural effusion.  Vascular congestion again noted.  The radius trace amount of fluid in the minor fissure.  IMPRESSION: Slight improvement in overall aeration with persistent retrocardiac collapse / consolidation with left effusion.   Original Report Authenticated By: Kennith Center, M.D.    Dg Chest Port 1 View  01/09/2013  *RADIOLOGY REPORT*  Clinical Data: Follow up  PORTABLE CHEST - 1 VIEW  Comparison: None.  Findings: Cardiomegaly with mild to moderate interstitial edema, increased.  Worsening opacification of the left mid lung, underlying pneumonia not excluded.  Moderate layering left pleural effusion.  No pneumothorax.  IMPRESSION: Cardiomegaly with mild to moderate interstitial edema, increased.  Moderate layering left pleural effusion.  Underlying left upper lobe pneumonia not excluded.   Original Report Authenticated By: Charline Bills, M.D.    Dg Chest Port 1 View  01/08/2013  *RADIOLOGY REPORT*  Clinical Data: Hypoxia.  PORTABLE CHEST - 1 VIEW  Comparison: CT chest 01/07/2013 and chest radiograph 01/01/2011.  Findings: Trachea is midline.  Heart size is accentuated by AP semi upright technique and low lung volumes.  There is central pulmonary vascular congestion with left perihilar and left lower lobe air space disease.  Mild diffuse interstitial prominence.  Small left pleural effusion.  IMPRESSION: Bilateral air space disease, left greater than right.  Left upper/left lower lobe pneumonia is queried. Probable underlying edema.   Original Report Authenticated By: Leanna Battles,  M.D.    Dg Abd Portable 1v  01/30/2013  *RADIOLOGY REPORT*  Clinical Data: Nasogastric tube placement.  PORTABLE ABDOMEN - 1 VIEW   Comparison: CT exam 01/27/2013  Findings: Nasogastric tube is in place, tip overlying the level of the distal stomach.  Bowel gas pattern is nonobstructive.  There is residual contrast in the colonic loops and rectosigmoid colon. Multiple colonic diverticula are present. Bowel loops overlie the left hip, consistent with abdominal wall laxity.  The patient has had previous spinal surgery.  Surgical clips are present in the right upper quadrant of the abdomen.  IMPRESSION: Nasogastric tube tip overlying the level of the distal stomach.   Original Report Authenticated By: Norva Pavlov, M.D.    Dg Abd Portable 1v  01/08/2013  *RADIOLOGY REPORT*  Clinical Data: Pancreatitis, abdominal pain and vomiting.  PORTABLE ABDOMEN - 1 VIEW  Comparison: CT of the abdomen and pelvis from 01/05/2013.  Findings: There is no evidence of bowel obstruction or significant ileus.  No gross signs of free air.  Extensive lumbar fusion hardware noted.  No abnormal calcifications identified.  IMPRESSION: No acute findings.   Original Report Authenticated By: Irish Lack, M.D.          Subjective: Patient has agreed to Dobbhoff tube placement. She states that the Dilaudid does help reduce her pain. Denies any vomiting, shortness breath, chest pain. Denies any diarrhea, fevers, chills, syncope.  Objective: Filed Vitals:   01/30/13 0613 01/30/13 1354 01/30/13 2041 01/31/13 0507  BP: 109/61 117/68 119/71 99/60  Pulse: 64 67 63 62  Temp: 98.1 F (36.7 C) 98.3 F (36.8 C) 98.2 F (36.8 C) 97.8 F (36.6 C)  TempSrc: Oral Oral Oral Oral  Resp: 18 17    Height:      Weight: 119.5 kg (263 lb 7.2 oz)     SpO2: 97% 98% 97% 98%    Intake/Output Summary (Last 24 hours) at 01/31/13 1037 Last data filed at 01/31/13 0600  Gross per 24 hour  Intake 2679.39 ml  Output   1750 ml  Net 929.39 ml   Weight change:  Exam:   General:  Pt is alert, follows commands appropriately, not in acute distress  HEENT: No icterus,  No thrush,  [place]/AT  Cardiovascular: RRR, S1/S2, no rubs, no gallops  Respiratory: CTA bilaterally, no wheezing, no crackles, no rhonchi  Abdomen: Soft/+BS, epigastric tenderness without any pertinent signs, non distended, no guarding  Extremities: 1+ edema, No lymphangitis, No petechiae, No rashes, no synovitis  Data Reviewed: Basic Metabolic Panel:  Lab 01/30/13 2841 01/29/13 0500 01/28/13 0500 01/27/13 0511 01/26/13 1750  NA 137 138 134* 138 137  K 3.8 3.4* 4.1 4.9 4.0  CL 101 99 96 99 99  CO2 29 28 29  32 28  GLUCOSE 156* 113* 98 108* 126*  BUN 10 9 9 13 12   CREATININE 0.58 0.57 0.65 0.77 0.60  CALCIUM 8.8 9.0 9.2 9.7 9.9  MG -- 2.0 2.1 -- --  PHOS -- 3.6 3.7 -- --   Liver Function Tests:  Lab 01/29/13 0500 01/28/13 0500 01/27/13 0511 01/26/13 1750  AST 24 51* 98* 117*  ALT 52* 76* 97* 61*  ALKPHOS 124* 151* 157* 133*  BILITOT 0.4 0.4 0.4 0.8  PROT 6.1 6.4 6.9 7.0  ALBUMIN 2.9* 2.9* 3.1* 3.2*    Lab 01/29/13 0500 01/28/13 0500 01/27/13 0511 01/26/13 1750  LIPASE 97* 104* 195* 277*  AMYLASE -- -- -- 226*   No results found for this basename:  AMMONIA:5 in the last 168 hours CBC:  Lab 01/29/13 0500 01/27/13 0511 01/26/13 1750  WBC 5.4 7.3 12.3*  NEUTROABS 3.0 -- 10.0*  HGB 12.6 13.5 14.6  HCT 38.8 42.4 43.9  MCV 89.0 89.6 88.2  PLT 214 301 293   Cardiac Enzymes: No results found for this basename: CKTOTAL:5,CKMB:5,CKMBINDEX:5,TROPONINI:5 in the last 168 hours BNP: No components found with this basename: POCBNP:5 CBG:  Lab 01/31/13 0541 01/31/13 0046 01/30/13 1759 01/30/13 1138 01/30/13 0627  GLUCAP 135* 109* 124* 115* 110*    No results found for this or any previous visit (from the past 240 hour(s)).   Scheduled Meds:   . insulin aspart  0-9 Units Subcutaneous Q6H  . lipase/protease/amylase  2 capsule Oral TID AC  . pantoprazole (PROTONIX) IV  40 mg Intravenous Q24H  . sodium chloride  10-40 mL Intracatheter Q12H  . sodium chloride  3 mL Intravenous  Q12H   Continuous Infusions:   . sodium chloride 35 mL/hr at 01/30/13 2258  . TPN (CLINIMIX) +/- additives 83 mL/hr at 01/30/13 1819     Kai Calico, DO  [facility] Pager [phone]  If 7PM-7AM, please contact night-coverage [email or web address] Password [facility] 01/31/2013, 10:37 AM   LOS: 5 days

## 2013-01-31 NOTE — Progress Notes (Signed)
Subjective: Frustrated (NGT placed yesterday instead of nasojejunal tube). Abdominal pain about the same.  Objective: Vital signs in last 24 hours: Temp:  [97.8 F (36.6 C)-98.3 F (36.8 C)] 97.8 F (36.6 C) (02/05 0507) Pulse Rate:  [62-67] 62  (02/05 0507) Resp:  [17] 17  (02/04 1354) BP: (99-119)/(60-71) 99/60 mmHg (02/05 0507) SpO2:  [97 %-98 %] 98 % (02/05 0507) Weight change:  Last BM Date: 01/30/13 (pt reports BM was 'smear')  PE: GEN:  Overweight, NAD  Lab Results: CBC    Component Value Date/Time   WBC 5.4 01/29/2013 0500   RBC 4.36 01/29/2013 0500   HGB 12.6 01/29/2013 0500   HCT 38.8 01/29/2013 0500   PLT 214 01/29/2013 0500   MCV 89.0 01/29/2013 0500   MCH 28.9 01/29/2013 0500   MCHC 32.5 01/29/2013 0500   RDW 13.3 01/29/2013 0500   LYMPHSABS 1.4 01/29/2013 0500   MONOABS 0.5 01/29/2013 0500   EOSABS 0.4 01/29/2013 0500   BASOSABS 0.0 01/29/2013 0500   CMP     Component Value Date/Time   NA 137 01/30/2013 0515   K 3.8 01/30/2013 0515   CL 101 01/30/2013 0515   CO2 29 01/30/2013 0515   GLUCOSE 156* 01/30/2013 0515   BUN 10 01/30/2013 0515   CREATININE 0.58 01/30/2013 0515   CALCIUM 8.8 01/30/2013 0515   PROT 6.1 01/29/2013 0500   ALBUMIN 2.9* 01/29/2013 0500   AST 24 01/29/2013 0500   ALT 52* 01/29/2013 0500   ALKPHOS 124* 01/29/2013 0500   BILITOT 0.4 01/29/2013 0500   GFRNONAA >90 01/30/2013 0515   GFRAA >90 01/30/2013 0515   Assessment:  1.  Pancreatitis, subacute, with pseudocysts. 2.  Elevated LFTs, mild, with no evidence of choledocholithiasis on yesterday's endoscopic ultrasound. 3.  Abdominal pain, worse after eating, likely reflective of subacute pancreatitis.  Plan:  1.  Pain control regimen paramount for discharge planning. 2.  Nasojejunal tube with tube feeds.  Once at goal, patient could be discharged with nasojejunal tube in place, with goal of 4-6 weeks of tube feeds. 3.  OOBTC, ambulate as tolerated. 4.  Hopefully home in a few days, once pain control and nutritive nasojejunal  tube feed goals achieved. 5.  Will follow.   Freddy Jaksch 01/31/2013, 10:05 AM

## 2013-02-01 ENCOUNTER — Inpatient Hospital Stay (HOSPITAL_COMMUNITY): Payer: Medicare Other

## 2013-02-01 DIAGNOSIS — E876 Hypokalemia: Secondary | ICD-10-CM

## 2013-02-01 LAB — COMPREHENSIVE METABOLIC PANEL
Albumin: 2.9 g/dL — ABNORMAL LOW (ref 3.5–5.2)
BUN: 13 mg/dL (ref 6–23)
Calcium: 8.9 mg/dL (ref 8.4–10.5)
Chloride: 102 mEq/L (ref 96–112)
Creatinine, Ser: 0.57 mg/dL (ref 0.50–1.10)
Total Bilirubin: 0.3 mg/dL (ref 0.3–1.2)
Total Protein: 6.5 g/dL (ref 6.0–8.3)

## 2013-02-01 LAB — PHOSPHORUS: Phosphorus: 3.7 mg/dL (ref 2.3–4.6)

## 2013-02-01 LAB — CBC
Hemoglobin: 12.9 g/dL (ref 12.0–15.0)
MCHC: 32.7 g/dL (ref 30.0–36.0)

## 2013-02-01 LAB — GLUCOSE, CAPILLARY
Glucose-Capillary: 103 mg/dL — ABNORMAL HIGH (ref 70–99)
Glucose-Capillary: 123 mg/dL — ABNORMAL HIGH (ref 70–99)

## 2013-02-01 LAB — MAGNESIUM: Magnesium: 1.9 mg/dL (ref 1.5–2.5)

## 2013-02-01 MED ORDER — VITAL 1.5 CAL PO LIQD
1000.0000 mL | ORAL | Status: DC
  Administered 2013-02-01: 10 mL
  Administered 2013-02-02 – 2013-02-05 (×4): 1000 mL
  Filled 2013-02-01 (×6): qty 1000

## 2013-02-01 MED ORDER — ALTEPLASE 2 MG IJ SOLR
2.0000 mg | Freq: Once | INTRAMUSCULAR | Status: DC
  Filled 2013-02-01: qty 2

## 2013-02-01 MED ORDER — IOHEXOL 300 MG/ML  SOLN
50.0000 mL | Freq: Once | INTRAMUSCULAR | Status: AC | PRN
  Administered 2013-02-01: 10 mL

## 2013-02-01 MED ORDER — CLINIMIX E/DEXTROSE (5/20) 5 % IV SOLN
INTRAVENOUS | Status: AC
  Administered 2013-02-01: 18:00:00 via INTRAVENOUS
  Filled 2013-02-01: qty 2000

## 2013-02-01 MED ORDER — M.V.I. ADULT IV INJ: INJECTION | INTRAVENOUS | Status: AC

## 2013-02-01 MED ORDER — VITAMINS A & D EX OINT
TOPICAL_OINTMENT | CUTANEOUS | Status: AC
  Administered 2013-02-01: 16:00:00
  Filled 2013-02-01: qty 10

## 2013-02-01 MED ORDER — LORAZEPAM 2 MG/ML IJ SOLN
1.0000 mg | Freq: Once | INTRAMUSCULAR | Status: AC
  Administered 2013-02-01: 1 mg via INTRAVENOUS
  Filled 2013-02-01: qty 1

## 2013-02-01 MED ORDER — METHOCARBAMOL 500 MG PO TABS
500.0000 mg | ORAL_TABLET | Freq: Four times a day (QID) | ORAL | Status: DC | PRN
  Administered 2013-02-01 – 2013-02-04 (×9): 500 mg via ORAL
  Filled 2013-02-01 (×10): qty 1

## 2013-02-01 NOTE — Progress Notes (Signed)
[facility] PROGRESS NOTE  [name] [name] ZOX:[id number] DOB: 02/19/1956 DOA: 01/26/2013 PCP: Kaleen Mask, MD  Assessment/Plan: Subacute pancreatitis/pancreatic pseudocyst/abdominal pain  -In the setting of recent gallstone pancreatitis  -Status post cholecystectomy 01/11/2013 secondary to gallstone pancreatitis -Intraoperative cholangiogram suggested choledocholithiasis -MRCP cannot be performed due to the patient's body habitus -CT abdomen and pelvis January 16th and 01/27/2013 revealed pancreatic pseudocysts -Patient readmitted 01/27/2013 due to increasing abdominal pain -Endoscopy ultrasound 01/29/13 showing multiple pancreatic pseudocyst without any CBD obstruction--scattered peripancreatic edema and cystic collections. No obvious mass noted. No evidence of bile stones or thickening noted with normal ampulla  -Appreciate GI followup  -dobbhoff tube placed today(02/01/13), start TF -Continue intravenous hydromorphone for now  -Necessity for Dobbhoff tube may preclude use of long-acting opioids due to the need of crushing the medications  -If patient is able to take pills with sips of water, long-acting opioids may be able to be used (MS Contin vs oxycontin) -I. have asked Dr. Ladona Ridgel for some assistance -Continue when necessary antiemetics  -Increase activity  -Nutrition will help assist in determining best tube feed regimen  -Case was discussed with Dr. Dulce Sellar today Hypokalemia  -Repleted -Check magnesium--1.9  Back spasms  -Convert to oral Robaxin once Dobbhoff tube was placed      Family Communication:   Sister in Social worker at beside Disposition Plan:   Home when medically stable   Procedures: EUS 01/29/13   Procedures/Studies: Dg Neck Soft Tissue  01/31/2013  *RADIOLOGY REPORT*  Clinical Data: The patient requires enteral feeding.  NECK SOFT TISSUES - 1+ VIEW  Comparison: None.  Findings: The patient was brought to radiology for fluoro guided feeding tube placement.   When the image intensifier was moved up over the region of the neck, the patient became extremely claustrophobic and was unable to continue with the procedure.  IMPRESSION: Unable to place a feeding tube secondary to the patient's claustrophobia.   Original Report Authenticated By: Kennith Center, M.D.    Dg Cholangiogram Operative  01/11/2013  *RADIOLOGY REPORT*  Clinical Data:   Cholecystectomy.  INTRAOPERATIVE CHOLANGIOGRAM  Technique:  Cholangiographic images from the C-arm fluoroscopic device were submitted for interpretation post-operatively.  Please see the procedural report for the amount of contrast and the fluoroscopy time utilized.  Comparison:  Abdominal CT 01/06/2012  Findings:  The intrahepatic and extrahepatic bile ducts are dilated.  No contrast is identified within the duodenum.  There may be small filling defects within the common bile duct.  IMPRESSION: The biliary system is dilated and there is concern for a distal obstruction since no contrast is identified within the duodenum.  Small filling defects within the common bile duct may represent sludge or debris.   Original Report Authenticated By: Richarda Overlie, M.D.    Ct Angio Chest Pe W/cm &/or Wo Cm  01/07/2013  *RADIOLOGY REPORT*  Clinical Data: Left-sided flank pain.  Pain with inspiration.  CT ANGIOGRAPHY CHEST  Technique:  Multidetector CT imaging of the chest using the standard protocol during bolus administration of intravenous contrast. Multiplanar reconstructed images including MIPs were obtained and reviewed to evaluate the vascular anatomy.  Contrast: 80mL OMNIPAQUE IOHEXOL 350 MG/ML SOLN  Comparison: Chest CT 09/03/2011.  Findings:  Mediastinum: Study is limited by large amount of patient respiratory motion.  With these limitations in mind, there is no evidence of central, lobar or proximal segmental sized pulmonary embolism.  Smaller distal segmental and subsegmental sized emboli cannot be completely excluded secondary to  respiratory motion. Heart size is normal.  There is no significant pericardial fluid, thickening or pericardial calcification.  No pathologically enlarged mediastinal or hilar lymph nodes. Esophagus is unremarkable in appearance.  Lungs/Pleura: Moderate left and trace right-sided pleural effusions.  Passive atelectasis in the dependent portion of the left lower lobe.  In addition, there is diffuse bronchial wall thickening with some thickening of the peribronchovascular interstitium and some patchy air space disease throughout the lungs bilaterally, predominately ground-glass in attenuation, suspicious for multifocal infection and/or inflammation.  This is relatively asymmetrically distributed.  Upper Abdomen: Perisplenic ascites.  Large amount of inflammatory changes in the retroperitoneum in the expected location of the tail of the pancreas, incompletely visualized.  Musculoskeletal: There are no aggressive appearing lytic or blastic lesions noted in the visualized portions of the skeleton.  IMPRESSION: 1.  Limited examination secondary to respiratory motion demonstrating no central, lobar or proximal segmental sized pulmonary embolism. 2.  The appearance of the lung parenchyma, as above, suggests multifocal infection and/or inflammation.  Specifically, the findings are most concerning for early changes of ARDS related to underlying pancreatitis. 3.  Moderate left and trace right-sided pleural effusions. 4.  Changes of pancreatitis incompletely visualized in the upper abdomen.   Original Report Authenticated By: Trudie Reed, M.D.    US Abdomen Complete  01/04/2013  *RADIOLOGY REPORT*  Clinical Data:  Abdominal pain.  Nausea and vomiting.  Elevated liver function test.  Elevated lipase.  COMPLETE ABDOMINAL ULTRASOUND  Comparison:  01/09/2009 CT.  Findings:  Gallbladder:  Multiple gallstones are present.  Largest measures 9 mm.  These demonstrate typical posterior acoustic shadowing and increased echogenicity.  There is no wall thickening.  No sonographic Murphy's sign.  Common bile duct:  Enlarged, measuring between 12 mm and 13 mm. There is no common duct stone identified.  Liver:  Mild intrahepatic biliary ductal dilation is present.  No focal mass lesion.  IVC:  Appears normal.  Pancreas:  Enlargement of the pancreatic head.  Again, no common duct stone is identified in the intrapancreatic distal common bile duct.  Spleen:  8.6 cm.  Normal echotexture.  Right Kidney:  12.2 cm. Normal echotexture.  Normal central sinus echo complex.  No calculi or hydronephrosis.  Left Kidney:  12.3 cm. Normal echotexture.  Normal central sinus echo complex.  No calculi or hydronephrosis.  Abdominal aorta:  2.4 cm.  IMPRESSION: 1.  Cholelithiasis without findings of acute cholecystitis. 2.  Dilated common bile duct with mild intrahepatic biliary ductal dilation.  There is no visualized common duct stone however with intra and extrahepatic biliary ductal dilation; findings suspicious for a distal common duct stone.  In the setting of elevated lipase, gallstone pancreatitis is likely. 3.  Enlargement of pancreatic head compatible with pancreatitis.   Original Report Authenticated By: Andreas Newport, M.D.    Ct Abdomen Pelvis W Contrast  01/27/2013  *RADIOLOGY REPORT*  Clinical Data: Status post cholecystectomy with postop choledocholithiasis.  Gallstone pancreatitis with known pseudocysts.  CT ABDOMEN AND PELVIS WITH CONTRAST  Technique:  Multidetector CT imaging of the abdomen and pelvis was performed following the standard protocol during bolus administration of intravenous contrast.  Contrast:  100 ml Omnipaque-300 IV  Comparison: 01/15/2013  Findings: Linear scarring versus atelectasis at the left lung base. Trace left pleural effusion, improved.  Liver is within normal limits.  Spleen is notable for small volume perisplenic fluid, likely reflecting a pseudocyst, improved.  In addition, at least four distinct peripancreatic  pseudocysts are present: --9.4 x 4.9 cm collection adjacent to the pancreatic  tail (series 2/image 22), previously 10.9 x 5.6 cm --4.7 x 7.8 cm collection anterior to the pancreatic body (series 2/image 32), previously well defined, measuring 5.8 x 9.0 cm --2.9 x 2.5 cm collection inferior to the pancreatic head (series 2/image 46), previously 3.5 x 2.9 cm --4.7 x 4.0 cm collection in the right mid abdomen (series 2/image 5), previously 4.9 x 4.9 cm  While all of these collections have a thin rim, compatible with pseudocysts, none of them demonstrate a thickened rim to suggest abscess.  No evidence of pancreatic necrosis.  Status post cholecystectomy.  No intrahepatic ductal dilatation. Stable common bile duct, measuring up to 2.0 cm in diameter, but tapering at the ampulla.  Left adrenal gland is mildly nodular/thickened, unchanged.  Right adrenal gland is unremarkable.  Kidneys are within normal limits.  No hydronephrosis.  No evidence of bowel obstruction.  Large abdominal hernia containing multiple loops of small and large bowel, unchanged. Colonic diverticulosis, without associated inflammatory changes.  Atherosclerotic calcifications of the abdominal aorta and branch vessels.  No abdominopelvic ascites.  No suspicious abdominopelvic lymphadenopathy.  Status post hysterectomy.  Bilateral ovaries are unremarkable.  Bladder is within normal limits.  Degenerative changes of the lumbar spine.  Status post PLIF from L2- 4.  Status post ALIF at L4-5 and L5-S1.  IMPRESSION: Multiple peripancreatic and perisplenic pseudocysts, as described above, mildly improved.  No evidence of abscess.  Status post cholecystectomy.  Stable common bile duct.  No intrahepatic ductal dilatation.  Large abdominal hernia containing multiple loops of small and large bowel.  No evidence of bowel obstruction.   Original Report Authenticated By: Charline Bills, M.D.    Ct Abdomen Pelvis W Contrast  01/15/2013  *RADIOLOGY REPORT*   Clinical Data: Acute pancreatitis.  CT ABDOMEN AND PELVIS WITH CONTRAST  Technique:  Multidetector CT imaging of the abdomen and pelvis was performed following the standard protocol during bolus administration of intravenous contrast.  Contrast: OMNIPAQUE IOHEXOL 300 MG/ML  SOLN  Comparison: 01/05/2013.  Findings: The lung bases demonstrate bilateral pleural effusions, left greater than right with overlying atelectasis.  These are new.  The liver demonstrates stable intrahepatic biliary dilatation.  No focal hepatic lesions.  The gallbladder is surgically absent. Stable common bile duct dilatation.  The spleen is mildly enlarged but but no focal lesions.  There is a large complex fluid collection surrounding the spleen with a thin enhancing membrane. This is most likely a pseudocyst.  There are also multiple complex pseudocyst surrounding the pancreas and in the splenic hilum and continuing down into the small bowel mesentery.  The pancreas demonstrates normal enhancement.  No findings to suggest pancreatic necrosis.  No hemorrhage or pseudoaneurysm.  The pancreatic duct is normal in caliber.  The adrenal glands and kidneys are unremarkable.  The stomach, duodenum, small bowel and colon grossly normal.  No findings for small bowel obstruction.  There is a large abdominal wall hernia containing small bowel and colon but no findings for obstruction/ incarceration.  There is extensive subcutaneous soft tissue swelling/edema which could suggest anasarca or cellulitis.  The bladder is unremarkable.  No pelvic mass or significant free pelvic fluid collection.  Surgical changes involving the lumbar spine are again demonstrated.  IMPRESSION:  1.  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 2.  No definite findings for abscess and no evidence of pancreatic necrosis. 3.  New bilateral pleural effusions, left greater than right with overlying  atelectasis. 4.  Stable biliary dilatation. 5.  Large abdominal wall hernia. 6.  New subcutaneous edema could suggest anasarca or cellulitis.   Original Report Authenticated By: Rudie Meyer, M.D.    Ct Abdomen Pelvis W Contrast  01/05/2013  *RADIOLOGY REPORT*  Clinical Data: Abdominal pain.  Nausea and vomiting.  CT ABDOMEN AND PELVIS WITH CONTRAST  Technique:  Multidetector CT imaging of the abdomen and pelvis was performed following the standard protocol during bolus administration of intravenous contrast.  Contrast: OMNIPAQUE IOHEXOL 300 MG/ML  SOLN  Comparison: CT of the abdomen and pelvis 01/09/2009.  Findings:  Lung Bases: Unremarkable.  Abdomen/Pelvis:  Gallbladder appears moderately distended and there is some pericholecystic fluid and stranding.  Mild intrahepatic biliary ductal dilatation.  Common bile duct also appears dilated measuring up to 15 mm in diameter in the porta hepatis.  No definite radiopaque stone is identified within the common bile duct, or within the lumen of the gallbladder.  The appearance of the liver is otherwise unremarkable.  Peripancreatic stranding is noted diffusely, suggesting pancreatitis.  There is a small amount of fluid surrounding the spleen.  The adrenal glands and kidneys are unremarkable in appearance bilaterally.  Atherosclerosis throughout the abdominal and pelvic vasculature, without definite aneurysm or dissection.  Numerous colonic diverticula are noted, without definite surrounding inflammatory changes to strongly suggest acute diverticulitis at this time.  The patient has a large ventral hernia inferiorly which contains a portion of the colon and multiple loops of small bowel.  No definite signs to suggest bowel incarceration or obstruction at this time.  Low-lying rectum well below the level of the pubococcygeal line, which could suggest rectal prolapse.  Status post hysterectomy.  Ovaries are atrophic. The urinary bladder is unremarkable in appearance.   Musculoskeletal: There are no aggressive appearing lytic or blastic lesions noted in the visualized portions of the skeleton. Anterior lumbar fixation at L4-L5 and L5-S1.  Status post laminectomy at L2, L3-L4 with PLIF from L2-L4.  Interbody grafts are present at L2-L3, L3-L4, L4-L5 and L5 - S1.  5 mm of retrolisthesis of L1 upon L2 is noted.  Alignment is otherwise anatomic.  IMPRESSION: 1.  Findings, as above, concerning for a the distal obstruction of the common bile duct, and possibly the pancreatic duct, with acute pancreatitis and possible acute cholecystitis. This could represent a stricture, recently passed a ductal stone, or a nonradiopaque stone in the distal common bile duct.  Clinical correlation is recommended. 2.  Extensive colonic diverticulosis without findings to suggest acute diverticulitis at this time.  3.  Large inferior ventral hernia containing portions of the colon and small bowel, without signs to suggest bowel obstruction at this time. 4.  Atherosclerosis. 5.  Extensive postoperative changes in the spine, as above, with 5 mm of retrolisthesis of L1 upon L2. 6.  Findings suggestive of rectal prolapse.  Clinical correlation may be warranted.   Original Report Authenticated By: Trudie Reed, M.D.    Dg Chest Port 1 View  02/01/2013  *RADIOLOGY REPORT*  Clinical Data: PICC line placement  PORTABLE CHEST - 1 VIEW  Comparison: 01/10/2013  Findings: Interval placement of a right PICC catheter with tip located over the lower SVC region.  No pneumothorax.  No pleural effusions.  Normal heart size and pulmonary vascularity.  No airspace disease or consolidation in the lungs.  IMPRESSION: PICC line appears to be in satisfactory position.  No pneumothorax. No active lung disease.   Original Report Authenticated By: Burman Nieves, M.D.  Dg Chest Port 1 View  01/10/2013  *RADIOLOGY REPORT*  Clinical Data: Follow-up  PORTABLE CHEST - 1 VIEW  Comparison: 01/09/2013  Findings: .  Lung volumes  remain low.  To cardiac collapse / consolidation persists with left pleural effusion.  Vascular congestion again noted.  The radius trace amount of fluid in the minor fissure.  IMPRESSION: Slight improvement in overall aeration with persistent retrocardiac collapse / consolidation with left effusion.   Original Report Authenticated By: Kennith Center, M.D.    Dg Chest Port 1 View  01/09/2013  *RADIOLOGY REPORT*  Clinical Data: Follow up  PORTABLE CHEST - 1 VIEW  Comparison: None.  Findings: Cardiomegaly with mild to moderate interstitial edema, increased.  Worsening opacification of the left mid lung, underlying pneumonia not excluded.  Moderate layering left pleural effusion.  No pneumothorax.  IMPRESSION: Cardiomegaly with mild to moderate interstitial edema, increased.  Moderate layering left pleural effusion.  Underlying left upper lobe pneumonia not excluded.   Original Report Authenticated By: Charline Bills, M.D.    Dg Chest Port 1 View  01/08/2013  *RADIOLOGY REPORT*  Clinical Data: Hypoxia.  PORTABLE CHEST - 1 VIEW  Comparison: CT chest 01/07/2013 and chest radiograph 01/01/2011.  Findings: Trachea is midline.  Heart size is accentuated by AP semi upright technique and low lung volumes.  There is central pulmonary vascular congestion with left perihilar and left lower lobe air space disease.  Mild diffuse interstitial prominence.  Small left pleural effusion.  IMPRESSION: Bilateral air space disease, left greater than right.  Left upper/left lower lobe pneumonia is queried. Probable underlying edema.   Original Report Authenticated By: Leanna Battles, M.D.    Dg Abd Portable 1v  01/30/2013  *RADIOLOGY REPORT*  Clinical Data: Nasogastric tube placement.  PORTABLE ABDOMEN - 1 VIEW  Comparison: CT exam 01/27/2013  Findings: Nasogastric tube is in place, tip overlying the level of the distal stomach.  Bowel gas pattern is nonobstructive.  There is residual contrast in the colonic loops and rectosigmoid  colon. Multiple colonic diverticula are present. Bowel loops overlie the left hip, consistent with abdominal wall laxity.  The patient has had previous spinal surgery.  Surgical clips are present in the right upper quadrant of the abdomen.  IMPRESSION: Nasogastric tube tip overlying the level of the distal stomach.   Original Report Authenticated By: Norva Pavlov, M.D.    Dg Abd Portable 1v  01/08/2013  *RADIOLOGY REPORT*  Clinical Data: Pancreatitis, abdominal pain and vomiting.  PORTABLE ABDOMEN - 1 VIEW  Comparison: CT of the abdomen and pelvis from 01/05/2013.  Findings: There is no evidence of bowel obstruction or significant ileus.  No gross signs of free air.  Extensive lumbar fusion hardware noted.  No abnormal calcifications identified.  IMPRESSION: No acute findings.   Original Report Authenticated By: Irish Lack, M.D.    Dg Intro Long Gi Tube  02/01/2013  *RADIOLOGY REPORT*  Clinical Data: Pancreatitis.  Panda tube placement  INTRO LONG GI TUBE  Technique: Under fluoroscopic guidance a feeding tube was introduced into the second portion of  the duodenum.  Placement was confirmed by injection of oral contrast.  Comparison:  None.  Findings: Under fluoroscopic guidance a feeding tube was introduced into the second portion of  the duodenum.  Placement was confirmed by injection of oral contrast. There is some redundant tube within the stomach. Stylet removed.  IMPRESSION: Placement of feeding tube within the second portion of the duodenum.   Original Report Authenticated By: Genevive Bi, M.D.  Subjective: Patient states that abdominal pain is controlled with hydromorphone IV. She denies any fevers, chills, chest pain, shortness breath, nausea, vomiting, diarrhea, dysuria.  Objective: Filed Vitals:   01/31/13 1429 01/31/13 2226 02/01/13 0521 02/01/13 1300  BP: 112/49 112/60 115/63 112/71  Pulse: 76 65 65 74  Temp: 98.3 F (36.8 C) 98.1 F (36.7 C) 98.2 F (36.8 C)  98.7 F (37.1 C)  TempSrc: Oral Oral Oral Oral  Resp: 20 18 20 17   Height:      Weight:      SpO2: 97% 97% 100% 100%    Intake/Output Summary (Last 24 hours) at 02/01/13 1500 Last data filed at 02/01/13 1440  Gross per 24 hour  Intake 2729.65 ml  Output   2500 ml  Net 229.65 ml   Weight change:  Exam:   General:  Pt is alert, follows commands appropriately, not in acute distress  HEENT: No icterus, No thrush,[place]/AT  Cardiovascular: RRR, S1/S2, no rubs, no gallops  Respiratory: CTA bilaterally, no wheezing, no crackles, no rhonchi  Abdomen: Soft/+BS, non distended, no guarding; epigastric tenderness without any guarding or peritoneal signs  Extremities: No edema, No lymphangitis, No petechiae, No rashes, no synovitis  Data Reviewed: Basic Metabolic Panel:  Lab 02/01/13 1610 01/30/13 0515 01/29/13 0500 01/28/13 0500 01/27/13 0511  NA 137 137 138 134* 138  K 3.9 3.8 3.4* 4.1 4.9  CL 102 101 99 96 99  CO2 28 29 28 29  32  GLUCOSE 120* 156* 113* 98 108*  BUN 13 10 9 9 13   CREATININE 0.57 0.58 0.57 0.65 0.77  CALCIUM 8.9 8.8 9.0 9.2 9.7  MG 1.9 -- 2.0 2.1 --  PHOS 3.7 -- 3.6 3.7 --   Liver Function Tests:  Lab 02/01/13 0850 01/29/13 0500 01/28/13 0500 01/27/13 0511 01/26/13 1750  AST 13 24 51* 98* 117*  ALT 22 52* 76* 97* 61*  ALKPHOS 97 124* 151* 157* 133*  BILITOT 0.3 0.4 0.4 0.4 0.8  PROT 6.5 6.1 6.4 6.9 7.0  ALBUMIN 2.9* 2.9* 2.9* 3.1* 3.2*    Lab 01/29/13 0500 01/28/13 0500 01/27/13 0511 01/26/13 1750  LIPASE 97* 104* 195* 277*  AMYLASE -- -- -- 226*   No results found for this basename: AMMONIA:5 in the last 168 hours CBC:  Lab 02/01/13 0850 01/29/13 0500 01/27/13 0511 01/26/13 1750  WBC 5.7 5.4 7.3 12.3*  NEUTROABS -- 3.0 -- 10.0*  HGB 12.9 12.6 13.5 14.6  HCT 39.5 38.8 42.4 43.9  MCV 89.8 89.0 89.6 88.2  PLT 186 214 301 293   Cardiac Enzymes: No results found for this basename: CKTOTAL:5,CKMB:5,CKMBINDEX:5,TROPONINI:5 in the last 168  hours BNP: No components found with this basename: POCBNP:5 CBG:  Lab 02/01/13 1203 02/01/13 0535 02/01/13 0019 01/31/13 1751 01/31/13 1143  GLUCAP 123* 95 103* 115* 105*    No results found for this or any previous visit (from the past 240 hour(s)).   Scheduled Meds:   . alteplase  2 mg Intracatheter Once  . feeding supplement (VITAL 1.5 CAL)  1,000 mL Per Tube Q24H  . insulin aspart  0-9 Units Subcutaneous Q6H  . lipase/protease/amylase  2 capsule Oral TID AC  . pantoprazole (PROTONIX) IV  40 mg Intravenous Q24H  . sodium chloride  10-40 mL Intracatheter Q12H  . sodium chloride  3 mL Intravenous Q12H   Continuous Infusions:   . sodium chloride 35 mL/hr at 02/01/13 0548  . fat emulsion 250 mL (01/31/13 1738)  . TPN (CLINIMIX) +/- additives    .  TPN [facility]) +/- additives       Thurl Boen, DO  [facility] Pager [phone]  If 7PM-7AM, please contact night-coverage [email or web address] Password [email or web address] 02/01/2013, 3:00 PM   LOS: 6 days

## 2013-02-01 NOTE — Progress Notes (Signed)
Subjective: Pain persists. Nasojejunal tube was not placed yesterday.  Objective: Vital signs in last 24 hours: Temp:  [98.1 F (36.7 C)-98.3 F (36.8 C)] 98.2 F (36.8 C) (02/06 0521) Pulse Rate:  [65-76] 65  (02/06 0521) Resp:  [18-20] 20  (02/06 0521) BP: (112-115)/(49-63) 115/63 mmHg (02/06 0521) SpO2:  [97 %-100 %] 100 % (02/06 0521) Weight change:  Last BM Date: 01/30/13 (pt reports BM was 'smear')  PE: GEN:  NAD, overweight ABD:  Soft, hypoactive bowel sounds, mild generalized tenderness  Assessment:  1.  Abdominal pain.  Likely from smoldering pancreatitis. 2.  Elevated LFTs, endoscopic ultrasound without evidence of choledocholithiasis.  Plan:  1.  Patient is going to need a maintenance pain medication regimen, at least over the next few weeks.  I don't think prn-only dosing will be viable over the short term. 2.  Patient would benefit from nasojejunal tube placement and nasojejunal tube feeds; I will discuss directly with radiologists. 3.  Will follow.   Freddy Jaksch 02/01/2013, 8:03 AM

## 2013-02-01 NOTE — Progress Notes (Signed)
NUTRITION FOLLOW UP  Intervention:   1.  Enteral nutrition; once tube placed and ready for use per IR, initiate Vital 1.5 @ 10 mL/hr.  Advance by 10 mL q 4 hrs to 55 mL/hr goal to provide 1980 kcal, 89g protein, 1008 mL free water. 2.  Fluid; once IVF discontinued.  Recommend 200 mL q 6 hrs if pt able to tolerate bolus volume. 3.  Parenteral nutrition; per PharmD management. Discussed transition period expected with initiation of enteral nutrition this evening.  Pt may be able to transition to cyclic feeds prior to discharge if she tolerates goal rate of 55 mL/hr.  If warranted, will assist pt in transitioning to goal of Vital 1.5 @ 80 mL/hr x17 hrs overnight.   Nutrition Dx:   Inadequate oral intake, ongoing  Goal:   Pt to meet >/=90% estimated needs  Monitor:   Enteral nutrition, parenteral nutrition, wt, labs, GI function  Assessment:   Pt admitted with abdominal pain.  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 Pt met criteria for for severe malnutrition of acute illness on admission.  She has been on TPN since (2/2) for nutrition support.  Patient is receiving TPN with Clinimix E 5/20 @ 83 ml/hr.  Lipids (20% IVFE @ 10 ml/hr), multivitamins, and trace elements are provided 3 times weekly (MWF) due to national backorder.  Provides 1959 kcal and 100 grams protein daily (based on weekly average).  Meets 98% minimum estimated kcal and 100% minimum estimated protein needs.  Additional IVF with NS @ 35 ml/hr.  Several attempts have been made for obtaining access for enteral feeds for pt.  She is scheduled for J-tube placement in IR today.  Consult for TF management has been received and RD to place orders in anticipation of initiation this afternoon after tube placement.  RD to order Vital 1.5 for pt. Elemental formula indicated due to increased abdominal pain with smoldering pancreatitis and pt with increased calorie needs who would  benefit from calorically dense formula if able to achieve cyclic feeds for home management.   Height: Ht Readings from Last 1 Encounters:  01/26/13 5\' 5"  (1.651 m)    Weight Status:   Wt Readings from Last 1 Encounters:  01/30/13 263 lb 7.2 oz (119.5 kg)    Re-estimated needs:  Kcal: 2000-2150 Protein: 110-120g Fluid: 2.0 L/day  Skin: upper and lower extremity edema  Diet Order: NPO   Intake/Output Summary (Last 24 hours) at 02/01/13 1115 Last data filed at 02/01/13 0645  Gross per 24 hour  Intake 2950.4 ml  Output   2500 ml  Net  450.4 ml    Last BM: 2/5   Labs:   Lab 02/01/13 0850 01/30/13 0515 01/29/13 0500 01/28/13 0500  NA 137 137 138 --  K 3.9 3.8 3.4* --  CL 102 101 99 --  CO2 28 29 28  --  BUN 13 10 9  --  CREATININE 0.57 0.58 0.57 --  CALCIUM 8.9 8.8 9.0 --  MG 1.9 -- 2.0 2.1  PHOS 3.7 -- 3.6 3.7  GLUCOSE 120* 156* 113* --    CBG (last 3)   Basename 02/01/13 0535 02/01/13 0019 01/31/13 1751  GLUCAP 95 103* 115*    Scheduled Meds:   . alteplase  2 mg Intracatheter Once  . insulin aspart  0-9 Units Subcutaneous Q6H  . lipase/protease/amylase  2 capsule Oral TID AC  . pantoprazole (PROTONIX) IV  40 mg Intravenous Q24H  . sodium chloride  10-40 mL Intracatheter Q12H  . sodium chloride  3 mL Intravenous Q12H    Continuous Infusions:   . sodium chloride 35 mL/hr at 02/01/13 0548  . fat emulsion 250 mL (01/31/13 1738)  . TPN (CLINIMIX) +/- additives    . TPN [facility]) +/- additives      Loyce Dys, MS RD LDN Clinical Inpatient Dietitian Pager: [phone] Weekend/After hours pager: [phone]

## 2013-02-01 NOTE — Progress Notes (Signed)
PARENTERAL NUTRITION CONSULT NOTE - FOLLOW UP  Pharmacy Consult for TNA Indication: Pancreatitis  Allergies  Allergen Reactions  . Ibuprofen Itching, Nausea And Vomiting and Swelling  . Other Hives    Mushrooms.   . Tape Rash    Patient Measurements: Height: 5\' 5"  (165.1 cm) Weight: 263 lb 7.2 oz (119.5 kg) IBW/kg (Calculated) : 57   Intake/Output from previous day: 02/05 0701 - 02/06 0700 In: 2950.4 [I.V.:835.3; IV Piggyback:60; TPN:2055.1] Out: 2750 [Urine:2750] Intake/Output from this shift:    Labs:  [facility] 02/01/13 0850  WBC 5.7  HGB 12.9  HCT 39.5  PLT 186  APTT --  INR --     Basename 02/01/13 0850 01/30/13 0515  NA 137 137  K 3.9 3.8  CL 102 101  CO2 28 29  GLUCOSE 120* 156*  BUN 13 10  CREATININE 0.57 0.58  LABCREA -- --  CREAT24HRUR -- --  CALCIUM 8.9 8.8  MG 1.9 --  PHOS 3.7 --  PROT 6.5 --  ALBUMIN 2.9* --  AST 13 --  ALT 22 --  ALKPHOS 97 --  BILITOT 0.3 --  BILIDIR -- --  IBILI -- --  PREALBUMIN -- --  TRIG -- --  CHOLHDL -- --  CHOL -- --   Estimated Creatinine Clearance: 104.1 ml/min (by C-G formula based on Cr of 0.57).    Basename 02/01/13 0535 02/01/13 0019 01/31/13 1751  GLUCAP 95 103* 115*   Insulin Requirements in the past 24 hours:  0  Nutritional Goals:  Updated RD recommendations: 1700-2000 calories, 85-95g protein   Clinimix E5/20 at a goal rate of 83 ml/hr + IVFE 20% at 10 ml/hr on MWF to provide: 100 g/day protein, 1959 Kcal/day avg.(2233 Kcal/day MWF, 1753 Kcal/day STTHS).  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 S/p cholecystecomy 1/16 with continued abdominal pain. EUS performed 2/3 - no evidence of  bile duct stones. Pt reported 28 lb weight lost in last month. Pt unable to tolerate clear liquids. Noted GI, surgery, and triad MD all recommend nasojejunal enteric feeds > TPN. Unable to place PANDA yesterday. Plan today is to reattempt placement of PANDA tube. Will continue TPN until TFs are tolerated, then will wean off. Discussed with RD, ok to continue at goal rate for now. Will adjust rate as needed once tube is placed TF is initiated.  Labs - CMET and CBC are wnl, except albumin low (2.9) CBGs at goal of <150 TGs: (2/3): 153 slightly elevated Prealbumin: (2/3): 24 ok  Plan:  1)  Increase Clinimix E5/20 back to goal rate of 83 ml/hr 2)  IV lipids, MVI, and TE on MWF only due to national backorder. 3) TNA labs every Mon and Thurs 4) F/U placement of PANDA and initiation/tolerance of TF. Hopefully can start to wean TNA soon if this occurs.  Darrol Angel, PharmD Pager: [phone] 02/01/2013,10:23 AM

## 2013-02-01 NOTE — Progress Notes (Signed)
Patient ID: [name] [name], female   DOB: July 28, 1956, 58 y.o.   MRN: [id number] [facility] Progress Note:   3 Days Post-Op  Subjective: Mental status is clear Objective: Vital signs in last 24 hours: Temp:  [98.1 F (36.7 C)-98.3 F (36.8 C)] 98.2 F (36.8 C) (02/06 0521) Pulse Rate:  [65-76] 65  (02/06 0521) Resp:  [18-20] 20  (02/06 0521) BP: (112-115)/(49-63) 115/63 mmHg (02/06 0521) SpO2:  [97 %-100 %] 100 % (02/06 0521)  Intake/Output from previous day: 02/05 0701 - 02/06 0700 In: 2950.4 [I.V.:835.3; IV Piggyback:60; TPN:2055.1] Out: 2750 [Urine:2750] Intake/Output this shift:    Physical Exam: Work of breathing is normal.  Minimal abdominal pain.  PIC line in place and TNA going.  Refused feeding tube yesterday.    Lab Results:  Results for orders placed during the hospital encounter of 01/26/13 (from the past 48 hour(s))  GLUCOSE, CAPILLARY     Status: Abnormal   Collection Time   01/30/13 11:38 AM      Component Value Range Comment   Glucose-Capillary 115 (*) 70 - 99 mg/dL   GLUCOSE, CAPILLARY     Status: Abnormal   Collection Time   01/30/13  5:59 PM      Component Value Range Comment   Glucose-Capillary 124 (*) 70 - 99 mg/dL   GLUCOSE, CAPILLARY     Status: Abnormal   Collection Time   01/31/13 12:46 AM      Component Value Range Comment   Glucose-Capillary 109 (*) 70 - 99 mg/dL   GLUCOSE, CAPILLARY     Status: Abnormal   Collection Time   01/31/13  5:41 AM      Component Value Range Comment   Glucose-Capillary 135 (*) 70 - 99 mg/dL   GLUCOSE, CAPILLARY     Status: Abnormal   Collection Time   01/31/13 11:43 AM      Component Value Range Comment   Glucose-Capillary 105 (*) 70 - 99 mg/dL   GLUCOSE, CAPILLARY     Status: Abnormal   Collection Time   01/31/13  5:51 PM      Component Value Range Comment   Glucose-Capillary 115 (*) 70 - 99 mg/dL   GLUCOSE, CAPILLARY     Status: Abnormal   Collection Time   02/01/13 12:19 AM      Component Value Range  Comment   Glucose-Capillary 103 (*) 70 - 99 mg/dL   GLUCOSE, CAPILLARY     Status: Normal   Collection Time   02/01/13  5:35 AM      Component Value Range Comment   Glucose-Capillary 95  70 - 99 mg/dL    Comment 1 Notify RN     CBC     Status: Normal   Collection Time   02/01/13  8:50 AM      Component Value Range Comment   WBC 5.7  4.0 - 10.5 K/uL    RBC 4.40  3.87 - 5.11 MIL/uL    Hemoglobin 12.9  12.0 - 15.0 g/dL    HCT 40.9  81.1 - 91.4 %    MCV 89.8  78.0 - 100.0 fL    MCH 29.3  26.0 - 34.0 pg    MCHC 32.7  30.0 - 36.0 g/dL    RDW 78.2  95.6 - 21.3 %    Platelets 186  150 - 400 K/uL     Radiology/Results: Dg Neck Soft Tissue  01/31/2013  *RADIOLOGY REPORT*  Clinical Data: The patient requires  enteral feeding.  NECK SOFT TISSUES - 1+ VIEW  Comparison: None.  Findings: The patient was brought to radiology for fluoro guided feeding tube placement.  When the image intensifier was moved up over the region of the neck, the patient became extremely claustrophobic and was unable to continue with the procedure.  IMPRESSION: Unable to place a feeding tube secondary to the patient's claustrophobia.   Original Report Authenticated By: Kennith Center, M.D.    Dg Chest Port 1 View  02/01/2013  *RADIOLOGY REPORT*  Clinical Data: PICC line placement  PORTABLE CHEST - 1 VIEW  Comparison: 01/10/2013  Findings: Interval placement of a right PICC catheter with tip located over the lower SVC region.  No pneumothorax.  No pleural effusions.  Normal heart size and pulmonary vascularity.  No airspace disease or consolidation in the lungs.  IMPRESSION: PICC line appears to be in satisfactory position.  No pneumothorax. No active lung disease.   Original Report Authenticated By: Burman Nieves, M.D.    Dg Abd Portable 1v  01/30/2013  *RADIOLOGY REPORT*  Clinical Data: Nasogastric tube placement.  PORTABLE ABDOMEN - 1 VIEW  Comparison: CT exam 01/27/2013  Findings: Nasogastric tube is in place, tip overlying the  level of the distal stomach.  Bowel gas pattern is nonobstructive.  There is residual contrast in the colonic loops and rectosigmoid colon. Multiple colonic diverticula are present. Bowel loops overlie the left hip, consistent with abdominal wall laxity.  The patient has had previous spinal surgery.  Surgical clips are present in the right upper quadrant of the abdomen.  IMPRESSION: Nasogastric tube tip overlying the level of the distal stomach.   Original Report Authenticated By: Norva Pavlov, M.D.     Anti-infectives: Anti-infectives    None      Assessment/Plan: Problem List: Patient Active Problem List  Diagnosis  . OBESITY  . ALLERGIC RHINITIS  . DEGENERATIVE DISC DISEASE  . HEADACHE, CHRONIC  . DYSPNEA  . COUGH, CHRONIC  . HYSTERECTOMY, HX OF  . Gallstone pancreatitis  . Common biliary duct obstruction  . Leukocytosis  . Acute respiratory failure: hypoxic and hypercarbic  . Atelectasis  . Pleural effusion  . Hypotension  . Choledocholithiasis  . Abdominal pain  . Muscle spasm of back  . Pseudocyst of pancreas  . Hypokalemia  . Uncontrolled pain    Biliary pancreatitis-hopefully not going to become chronic pancreatitis 3 Days Post-Op    LOS: 6 days   Matt B. Daphine Deutscher, MD, [facility] [phone] beeper [phone]  02/01/2013 9:26 AM

## 2013-02-01 NOTE — Progress Notes (Signed)
Patient [name] [name]      DOB: 31-Aug-1956      XBM:[id number]  Palliative Medicine has received your request and will see the patient as soon as possible in the am.   Xiadani Damman L. Ladona Ridgel, MD MBA The Palliative Medicine Team at [facility] Phone: [phone] Pager: [phone]

## 2013-02-02 DIAGNOSIS — M549 Dorsalgia, unspecified: Secondary | ICD-10-CM

## 2013-02-02 LAB — GLUCOSE, CAPILLARY
Glucose-Capillary: 125 mg/dL — ABNORMAL HIGH (ref 70–99)
Glucose-Capillary: 102 mg/dL — ABNORMAL HIGH (ref 70–99)

## 2013-02-02 MED ORDER — LIDOCAINE 5 % EX PTCH
1.0000 | MEDICATED_PATCH | CUTANEOUS | Status: DC
Start: 2013-02-02 — End: 2013-02-06
  Administered 2013-02-02 – 2013-02-06 (×5): 1 via TRANSDERMAL
  Filled 2013-02-02 (×5): qty 1

## 2013-02-02 MED ORDER — HYDROMORPHONE HCL PF 1 MG/ML IJ SOLN: 0.2500 mg | INTRAMUSCULAR | Status: DC | PRN

## 2013-02-02 MED ORDER — MORPHINE SULFATE ER 15 MG PO TBCR
15.0000 mg | EXTENDED_RELEASE_TABLET | Freq: Two times a day (BID) | ORAL | Status: DC
  Administered 2013-02-02 – 2013-02-03 (×2): 15 mg via ORAL
  Filled 2013-02-02 (×2): qty 1

## 2013-02-02 MED ORDER — MORPHINE SULFATE 10 MG/5ML PO SOLN
5.0000 mg | ORAL | Status: DC | PRN
  Administered 2013-02-02 – 2013-02-03 (×3): 5 mg via ORAL
  Filled 2013-02-02 (×3): qty 5

## 2013-02-02 NOTE — Progress Notes (Signed)
Checked on pt. Pt is now at goal rate for tube feeds and tolerating them well; no residuals per RN. Pain continues but, is not worse with tube feeds. Pt is taking medications by mouth per RN. TPN is currently running at 56ml/hr but, will be discontinued tonight. Will monitor pt and possibly try cyclic feeds on Monday to prepare pt for home tube feeds after discharge.  Ian Malkin RD, LDN Inpatient Clinical Dietitian Pager: [phone] After Hours Pager: [phone]

## 2013-02-02 NOTE — Progress Notes (Signed)
Subjective: Pain a little better. Nasoenteric feeding tube placed, and is tolerating tube feeds thus far.  Objective: Vital signs in last 24 hours: Temp:  [98 F (36.7 C)-98.7 F (37.1 C)] 98.3 F (36.8 C) (02/07 0546) Pulse Rate:  [68-86] 68  (02/07 0546) Resp:  [17-20] 18  (02/07 0546) BP: (103-112)/(58-71) 103/58 mmHg (02/07 0546) SpO2:  [97 %-100 %] 97 % (02/07 0546) Weight:  [120.203 kg (265 lb)] 120.203 kg (265 lb) (02/07 0500) Weight change:  Last BM Date: 02/01/13  PE: GEN:  NAD, overweight ABD:  Soft, mild generalized tenderness  Studies:  Nasoenteric feeding tube placed (only in proximal duodenum)  Assessment:  1.  Pancreatitis with pseudocysts.  Unable to tolerate diet. 2.  Abdominal pain, likely due to #1 above. 3.  Elevated LFTs; endoscopic ultrasound showed no evidence of choledocholithiasis.  Plan:  1.  Advance nasoenteric tube feeds per nutritionist. 2.  Appreciate Palliative Care involvement with pain control. 3.  Sips liquids by mouth ok, otherwise no peroral diet. 4.  I am concerned about tube maintaining it's position in the small bowel (it is only marginally distal enough as it is); would repeat abdominal xray next week to check tube positioning; it may require repositioning prior to patient discharge. 5.  PT/OT evaluation for disposition, hopefully able to be transitioned out of hospital some time next week. 6.  [facility] to follow.   [name] [name] 02/02/2013, 8:04 AM

## 2013-02-02 NOTE — Progress Notes (Signed)
Patient [name] [name]      DOB: 07-Jul-1956      VHQ:[id number]  Attempted to see patient last evening however she was soundly sleeping I did not awaken her for this interview.  Consult for symptom management complete;  Summary as follows .  Full note to follow:  Time: 7 AM- 7:25 AM  [name] [name] states her general pain is located in her central abdomen. It has in the past radiated to her back however at present it is not radiating. She has additional chronic back pain which is starting to flare causing her spasms and discomfort in her upper back between her scapula. She states functional pain for her is a 3-4/10. She states that if she is crying and her pain is probably 9/10 or greater. Patient relates that the Dilaudid IV has improved her pain control. In addition she credits some improvement in her comfort 2 and antianxiety medication that was added. She relates that in the past she has used hydrocodone for her pain control. She also has had morphine in the past which has done a good job in controlling her discomfort. For her chronic back pain she finds Robaxin to be very helpful.  Recommendations: I will need to discuss this with Dr. Leta Jungling but if there is no contraindication to small medications by mouth I would favor using low dose MS Contin with short acting morphine for breakthrough pain. The patient could also continue her Robaxin as needed. I would add a lidocaine patch to her back to help with the spasms. If she is unable to take medications by mouth and we could use short acting concentrated liquid more frequently perhaps 5 mg of morphine  immediate release every 4 hours with the ability to give breakthrough medication every 2 hours .I will plan to start this later today after speaking with Dr. Leta Jungling and we can observe her today with regard to her pain medications. If these medicines are added I would recommend decreasing the dose of Dilaudid available for backup as it would not want to cause  oversedation. At the same time,  I would not want to leave her without reasonable breakthrough coverage.   With regard to anxiety, the patient responded very well to the low dose Ativan. It seems reasonable to continue this for her under the current circumstances.     [name] [name] L. Ladona Ridgel, MD MBA The Palliative Medicine Team at [facility] Phone: [phone] Pager: [phone]

## 2013-02-02 NOTE — Progress Notes (Signed)
Patient ID: [name] [name], female   DOB: Nov 22, 1956, 57 y.o.   MRN: [id number] 4 Days Post-Op  Subjective: Pt having a hard time with back spasms today. Belly is no different.  No increase in pain with TFs.  Objective: Vital signs in last 24 hours: Temp:  [98 F (36.7 C)-98.7 F (37.1 C)] 98.3 F (36.8 C) (02/07 0546) Pulse Rate:  [68-86] 68  (02/07 0546) Resp:  [17-20] 18  (02/07 0546) BP: (103-112)/(58-71) 103/58 mmHg (02/07 0546) SpO2:  [97 %-100 %] 97 % (02/07 0546) Weight:  [265 lb (120.203 kg)] 265 lb (120.203 kg) (02/07 0500) Last BM Date: 02/02/13  Intake/Output from previous day: 02/06 0701 - 02/07 0700 In: 2847.5 [I.V.:848.8; NG/GT:440; IV Piggyback:10; TPN:1548.8] Out: 1850 [Urine:1850] Intake/Output this shift:    PE: Abd: soft, tender, obese, +BS  Lab Results:   [facility] 02/01/13 0850  WBC 5.7  HGB 12.9  HCT 39.5  PLT 186   BMET  Basename 02/01/13 0850  NA 137  K 3.9  CL 102  CO2 28  GLUCOSE 120*  BUN 13  CREATININE 0.57  CALCIUM 8.9   PT/INR No results found for this basename: LABPROT:2,INR:2 in the last 72 hours CMP     Component Value Date/Time   NA 137 02/01/2013 0850   K 3.9 02/01/2013 0850   CL 102 02/01/2013 0850   CO2 28 02/01/2013 0850   GLUCOSE 120* 02/01/2013 0850   BUN 13 02/01/2013 0850   CREATININE 0.57 02/01/2013 0850   CALCIUM 8.9 02/01/2013 0850   PROT 6.5 02/01/2013 0850   ALBUMIN 2.9* 02/01/2013 0850   AST 13 02/01/2013 0850   ALT 22 02/01/2013 0850   ALKPHOS 97 02/01/2013 0850   BILITOT 0.3 02/01/2013 0850   GFRNONAA >90 02/01/2013 0850   GFRAA >90 02/01/2013 0850   Lipase     Component Value Date/Time   LIPASE 97* 01/29/2013 0500       Studies/Results: Dg Neck Soft Tissue  01/31/2013  *RADIOLOGY REPORT*  Clinical Data: The patient requires enteral feeding.  NECK SOFT TISSUES - 1+ VIEW  Comparison: None.  Findings: The patient was brought to radiology for fluoro guided feeding tube placement.  When the image intensifier was moved up over  the region of the neck, the patient became extremely claustrophobic and was unable to continue with the procedure.  IMPRESSION: Unable to place a feeding tube secondary to the patient's claustrophobia.   Original Report Authenticated By: Kennith Center, M.D.    Dg Chest Port 1 View  02/01/2013  *RADIOLOGY REPORT*  Clinical Data: PICC line placement  PORTABLE CHEST - 1 VIEW  Comparison: 01/10/2013  Findings: Interval placement of a right PICC catheter with tip located over the lower SVC region.  No pneumothorax.  No pleural effusions.  Normal heart size and pulmonary vascularity.  No airspace disease or consolidation in the lungs.  IMPRESSION: PICC line appears to be in satisfactory position.  No pneumothorax. No active lung disease.   Original Report Authenticated By: Burman Nieves, M.D.    Dg Intro Long Gi Tube  02/01/2013  *RADIOLOGY REPORT*  Clinical Data: Pancreatitis.  Panda tube placement  INTRO LONG GI TUBE  Technique: Under fluoroscopic guidance a feeding tube was introduced into the second portion of  the duodenum.  Placement was confirmed by injection of oral contrast.  Comparison:  None.  Findings: Under fluoroscopic guidance a feeding tube was introduced into the second portion of  the duodenum.  Placement was confirmed by  injection of oral contrast. There is some redundant tube within the stomach. Stylet removed.  IMPRESSION: Placement of feeding tube within the second portion of the duodenum.   Original Report Authenticated By: Genevive Bi, M.D.     Anti-infectives: Anti-infectives    None       Assessment/Plan  1. Pancreatitis with evolving pseudocysts  Plan: 1. PANDA tube in place with TFs at or near goal rate.  She is tolerating these well for now. 2. Cont to hold on po intake until pain begins to improve. 3. Will follow.   LOS: 7 days    Shanette Tamargo E 02/02/2013, 12:10 PM Pager: [phone]
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
## 2013-02-02 NOTE — Progress Notes (Addendum)
[facility] PROGRESS NOTE  [name] [name] RUE:[id number] DOB: 1956-04-14 DOA: 01/26/2013 PCP: Kaleen Mask, MD  Assessment/Plan: Subacute pancreatitis/pancreatic pseudocyst/abdominal pain  -In the setting of recent gallstone pancreatitis  -Status post cholecystectomy 01/11/2013 secondary to gallstone pancreatitis  -Intraoperative cholangiogram suggested choledocholithiasis  -MRCP cannot be performed due to the patient's body habitus  -CT abdomen and pelvis January 16th and 01/27/2013 revealed pancreatic pseudocysts  -Patient readmitted 01/27/2013 due to increasing abdominal pain  -Endoscopy ultrasound 01/29/13 showing multiple pancreatic pseudocyst without any CBD obstruction--scattered peripancreatic edema and cystic collections. No obvious mass noted. No evidence of bile stones or thickening noted with normal ampulla  -Appreciate GI followup  -dobbhoff tube placed today(02/01/13), start TF  -Continue intravenous hydromorphone for now  -Necessity for Dobbhoff tube may preclude use of long-acting opioids due to the need of crushing the medications  -If patient is able to take pills with sips of water, long-acting opioids may be able to be used (MS Contin vs oxycontin)  -Appreciate Dr. Lubertha Basque assistance -Continue MS Contin 15 mg every 12 hours; continue Roxanol solution for breakthrough pain; -Discontinue IV Dilaudid -continue prn ativan -Continue when necessary antiemetics  -Increase activity  -Case was discussed with Dr. Dulce Sellar today  Hypokalemia  -Repleted  -Check magnesium--1.9  Back spasms  -Convert to oral Robaxin Nutrition -Patient is currently tolerating tube feeds without significant pain worsening -Recheck electrolytes in the morning -Start TPN, patient is at goal rate for tube feeds     Procedures/Studies: Dg Neck Soft Tissue  01/31/2013  *RADIOLOGY REPORT*  Clinical Data: The patient requires enteral feeding.  NECK SOFT TISSUES - 1+ VIEW  Comparison: None.   Findings: The patient was brought to radiology for fluoro guided feeding tube placement.  When the image intensifier was moved up over the region of the neck, the patient became extremely claustrophobic and was unable to continue with the procedure.  IMPRESSION: Unable to place a feeding tube secondary to the patient's claustrophobia.   Original Report Authenticated By: Kennith Center, M.D.    Dg Cholangiogram Operative  01/11/2013  *RADIOLOGY REPORT*  Clinical Data:   Cholecystectomy.  INTRAOPERATIVE CHOLANGIOGRAM  Technique:  Cholangiographic images from the C-arm fluoroscopic device were submitted for interpretation post-operatively.  Please see the procedural report for the amount of contrast and the fluoroscopy time utilized.  Comparison:  Abdominal CT 01/06/2012  Findings:  The intrahepatic and extrahepatic bile ducts are dilated.  No contrast is identified within the duodenum.  There may be small filling defects within the common bile duct.  IMPRESSION: The biliary system is dilated and there is concern for a distal obstruction since no contrast is identified within the duodenum.  Small filling defects within the common bile duct may represent sludge or debris.   Original Report Authenticated By: Richarda Overlie, M.D.    Ct Angio Chest Pe W/cm &/or Wo Cm  01/07/2013  *RADIOLOGY REPORT*  Clinical Data: Left-sided flank pain.  Pain with inspiration.  CT ANGIOGRAPHY CHEST  Technique:  Multidetector CT imaging of the chest using the standard protocol during bolus administration of intravenous contrast. Multiplanar reconstructed images including MIPs were obtained and reviewed to evaluate the vascular anatomy.  Contrast: 80mL OMNIPAQUE IOHEXOL 350 MG/ML SOLN  Comparison: Chest CT 09/03/2011.  Findings:  Mediastinum: Study is limited by large amount of patient respiratory motion.  With these limitations in mind, there is no evidence of central, lobar or proximal segmental sized pulmonary embolism.  Smaller distal  segmental and subsegmental sized emboli cannot be  completely excluded secondary to respiratory motion. Heart size is normal. There is no significant pericardial fluid, thickening or pericardial calcification.  No pathologically enlarged mediastinal or hilar lymph nodes. Esophagus is unremarkable in appearance.  Lungs/Pleura: Moderate left and trace right-sided pleural effusions.  Passive atelectasis in the dependent portion of the left lower lobe.  In addition, there is diffuse bronchial wall thickening with some thickening of the peribronchovascular interstitium and some patchy air space disease throughout the lungs bilaterally, predominately ground-glass in attenuation, suspicious for multifocal infection and/or inflammation.  This is relatively asymmetrically distributed.  Upper Abdomen: Perisplenic ascites.  Large amount of inflammatory changes in the retroperitoneum in the expected location of the tail of the pancreas, incompletely visualized.  Musculoskeletal: There are no aggressive appearing lytic or blastic lesions noted in the visualized portions of the skeleton.  IMPRESSION: 1.  Limited examination secondary to respiratory motion demonstrating no central, lobar or proximal segmental sized pulmonary embolism. 2.  The appearance of the lung parenchyma, as above, suggests multifocal infection and/or inflammation.  Specifically, the findings are most concerning for early changes of ARDS related to underlying pancreatitis. 3.  Moderate left and trace right-sided pleural effusions. 4.  Changes of pancreatitis incompletely visualized in the upper abdomen.   Original Report Authenticated By: Trudie Reed, M.D.    US Abdomen Complete  01/04/2013  *RADIOLOGY REPORT*  Clinical Data:  Abdominal pain.  Nausea and vomiting.  Elevated liver function test.  Elevated lipase.  COMPLETE ABDOMINAL ULTRASOUND  Comparison:  01/09/2009 CT.  Findings:  Gallbladder:  Multiple gallstones are present.  Largest measures 9 mm.   These demonstrate typical posterior acoustic shadowing and increased echogenicity. There is no wall thickening.  No sonographic Murphy's sign.  Common bile duct:  Enlarged, measuring between 12 mm and 13 mm. There is no common duct stone identified.  Liver:  Mild intrahepatic biliary ductal dilation is present.  No focal mass lesion.  IVC:  Appears normal.  Pancreas:  Enlargement of the pancreatic head.  Again, no common duct stone is identified in the intrapancreatic distal common bile duct.  Spleen:  8.6 cm.  Normal echotexture.  Right Kidney:  12.2 cm. Normal echotexture.  Normal central sinus echo complex.  No calculi or hydronephrosis.  Left Kidney:  12.3 cm. Normal echotexture.  Normal central sinus echo complex.  No calculi or hydronephrosis.  Abdominal aorta:  2.4 cm.  IMPRESSION: 1.  Cholelithiasis without findings of acute cholecystitis. 2.  Dilated common bile duct with mild intrahepatic biliary ductal dilation.  There is no visualized common duct stone however with intra and extrahepatic biliary ductal dilation; findings suspicious for a distal common duct stone.  In the setting of elevated lipase, gallstone pancreatitis is likely. 3.  Enlargement of pancreatic head compatible with pancreatitis.   Original Report Authenticated By: Andreas Newport, M.D.    Ct Abdomen Pelvis W Contrast  01/27/2013  *RADIOLOGY REPORT*  Clinical Data: Status post cholecystectomy with postop choledocholithiasis.  Gallstone pancreatitis with known pseudocysts.  CT ABDOMEN AND PELVIS WITH CONTRAST  Technique:  Multidetector CT imaging of the abdomen and pelvis was performed following the standard protocol during bolus administration of intravenous contrast.  Contrast:  100 ml Omnipaque-300 IV  Comparison: 01/15/2013  Findings: Linear scarring versus atelectasis at the left lung base. Trace left pleural effusion, improved.  Liver is within normal limits.  Spleen is notable for small volume perisplenic fluid, likely reflecting a  pseudocyst, improved.  In addition, at least four distinct peripancreatic pseudocysts are  present: --9.4 x 4.9 cm collection adjacent to the pancreatic tail (series 2/image 22), previously 10.9 x 5.6 cm --4.7 x 7.8 cm collection anterior to the pancreatic body (series 2/image 32), previously well defined, measuring 5.8 x 9.0 cm --2.9 x 2.5 cm collection inferior to the pancreatic head (series 2/image 46), previously 3.5 x 2.9 cm --4.7 x 4.0 cm collection in the right mid abdomen (series 2/image 5), previously 4.9 x 4.9 cm  While all of these collections have a thin rim, compatible with pseudocysts, none of them demonstrate a thickened rim to suggest abscess.  No evidence of pancreatic necrosis.  Status post cholecystectomy.  No intrahepatic ductal dilatation. Stable common bile duct, measuring up to 2.0 cm in diameter, but tapering at the ampulla.  Left adrenal gland is mildly nodular/thickened, unchanged.  Right adrenal gland is unremarkable.  Kidneys are within normal limits.  No hydronephrosis.  No evidence of bowel obstruction.  Large abdominal hernia containing multiple loops of small and large bowel, unchanged. Colonic diverticulosis, without associated inflammatory changes.  Atherosclerotic calcifications of the abdominal aorta and branch vessels.  No abdominopelvic ascites.  No suspicious abdominopelvic lymphadenopathy.  Status post hysterectomy.  Bilateral ovaries are unremarkable.  Bladder is within normal limits.  Degenerative changes of the lumbar spine.  Status post PLIF from L2- 4.  Status post ALIF at L4-5 and L5-S1.  IMPRESSION: Multiple peripancreatic and perisplenic pseudocysts, as described above, mildly improved.  No evidence of abscess.  Status post cholecystectomy.  Stable common bile duct.  No intrahepatic ductal dilatation.  Large abdominal hernia containing multiple loops of small and large bowel.  No evidence of bowel obstruction.   Original Report Authenticated By: Charline Bills, M.D.     Ct Abdomen Pelvis W Contrast  01/15/2013  *RADIOLOGY REPORT*  Clinical Data: Acute pancreatitis.  CT ABDOMEN AND PELVIS WITH CONTRAST  Technique:  Multidetector CT imaging of the abdomen and pelvis was performed following the standard protocol during bolus administration of intravenous contrast.  Contrast: OMNIPAQUE IOHEXOL 300 MG/ML  SOLN  Comparison: 01/05/2013.  Findings: The lung bases demonstrate bilateral pleural effusions, left greater than right with overlying atelectasis.  These are new.  The liver demonstrates stable intrahepatic biliary dilatation.  No focal hepatic lesions.  The gallbladder is surgically absent. Stable common bile duct dilatation.  The spleen is mildly enlarged but but no focal lesions.  There is a large complex fluid collection surrounding the spleen with a thin enhancing membrane. This is most likely a pseudocyst.  There are also multiple complex pseudocyst surrounding the pancreas and in the splenic hilum and continuing down into the small bowel mesentery.  The pancreas demonstrates normal enhancement.  No findings to suggest pancreatic necrosis.  No hemorrhage or pseudoaneurysm.  The pancreatic duct is normal in caliber.  The adrenal glands and kidneys are unremarkable.  The stomach, duodenum, small bowel and colon grossly normal.  No findings for small bowel obstruction.  There is a large abdominal wall hernia containing small bowel and colon but no findings for obstruction/ incarceration.  There is extensive subcutaneous soft tissue swelling/edema which could suggest anasarca or cellulitis.  The bladder is unremarkable.  No pelvic mass or significant free pelvic fluid collection.  Surgical changes involving the lumbar spine are again demonstrated.  IMPRESSION:  1.  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 2.  No definite findings for abscess and no evidence of pancreatic necrosis. 3.  New  bilateral pleural effusions, left greater than right with overlying atelectasis. 4.  Stable biliary dilatation. 5.  Large abdominal wall hernia. 6.  New subcutaneous edema could suggest anasarca or cellulitis.   Original Report Authenticated By: Rudie Meyer, M.D.    Ct Abdomen Pelvis W Contrast  01/05/2013  *RADIOLOGY REPORT*  Clinical Data: Abdominal pain.  Nausea and vomiting.  CT ABDOMEN AND PELVIS WITH CONTRAST  Technique:  Multidetector CT imaging of the abdomen and pelvis was performed following the standard protocol during bolus administration of intravenous contrast.  Contrast: OMNIPAQUE IOHEXOL 300 MG/ML  SOLN  Comparison: CT of the abdomen and pelvis 01/09/2009.  Findings:  Lung Bases: Unremarkable.  Abdomen/Pelvis:  Gallbladder appears moderately distended and there is some pericholecystic fluid and stranding.  Mild intrahepatic biliary ductal dilatation.  Common bile duct also appears dilated measuring up to 15 mm in diameter in the porta hepatis.  No definite radiopaque stone is identified within the common bile duct, or within the lumen of the gallbladder.  The appearance of the liver is otherwise unremarkable.  Peripancreatic stranding is noted diffusely, suggesting pancreatitis.  There is a small amount of fluid surrounding the spleen.  The adrenal glands and kidneys are unremarkable in appearance bilaterally.  Atherosclerosis throughout the abdominal and pelvic vasculature, without definite aneurysm or dissection.  Numerous colonic diverticula are noted, without definite surrounding inflammatory changes to strongly suggest acute diverticulitis at this time.  The patient has a large ventral hernia inferiorly which contains a portion of the colon and multiple loops of small bowel.  No definite signs to suggest bowel incarceration or obstruction at this time.  Low-lying rectum well below the level of the pubococcygeal line, which could suggest rectal prolapse.  Status post hysterectomy.   Ovaries are atrophic. The urinary bladder is unremarkable in appearance.  Musculoskeletal: There are no aggressive appearing lytic or blastic lesions noted in the visualized portions of the skeleton. Anterior lumbar fixation at L4-L5 and L5-S1.  Status post laminectomy at L2, L3-L4 with PLIF from L2-L4.  Interbody grafts are present at L2-L3, L3-L4, L4-L5 and L5 - S1.  5 mm of retrolisthesis of L1 upon L2 is noted.  Alignment is otherwise anatomic.  IMPRESSION: 1.  Findings, as above, concerning for a the distal obstruction of the common bile duct, and possibly the pancreatic duct, with acute pancreatitis and possible acute cholecystitis. This could represent a stricture, recently passed a ductal stone, or a nonradiopaque stone in the distal common bile duct.  Clinical correlation is recommended. 2.  Extensive colonic diverticulosis without findings to suggest acute diverticulitis at this time.  3.  Large inferior ventral hernia containing portions of the colon and small bowel, without signs to suggest bowel obstruction at this time. 4.  Atherosclerosis. 5.  Extensive postoperative changes in the spine, as above, with 5 mm of retrolisthesis of L1 upon L2. 6.  Findings suggestive of rectal prolapse.  Clinical correlation may be warranted.   Original Report Authenticated By: Trudie Reed, M.D.    Dg Chest Port 1 View  02/01/2013  *RADIOLOGY REPORT*  Clinical Data: PICC line placement  PORTABLE CHEST - 1 VIEW  Comparison: 01/10/2013  Findings: Interval placement of a right PICC catheter with tip located over the lower SVC region.  No pneumothorax.  No pleural effusions.  Normal heart size and pulmonary vascularity.  No airspace disease or consolidation in the lungs.  IMPRESSION: PICC line appears to be in satisfactory position.  No pneumothorax. No active lung disease.  Original Report Authenticated By: Burman Nieves, M.D.    Dg Chest Port 1 View  01/10/2013  *RADIOLOGY REPORT*  Clinical Data: Follow-up   PORTABLE CHEST - 1 VIEW  Comparison: 01/09/2013  Findings: .  Lung volumes remain low.  To cardiac collapse / consolidation persists with left pleural effusion.  Vascular congestion again noted.  The radius trace amount of fluid in the minor fissure.  IMPRESSION: Slight improvement in overall aeration with persistent retrocardiac collapse / consolidation with left effusion.   Original Report Authenticated By: Kennith Center, M.D.    Dg Chest Port 1 View  01/09/2013  *RADIOLOGY REPORT*  Clinical Data: Follow up  PORTABLE CHEST - 1 VIEW  Comparison: None.  Findings: Cardiomegaly with mild to moderate interstitial edema, increased.  Worsening opacification of the left mid lung, underlying pneumonia not excluded.  Moderate layering left pleural effusion.  No pneumothorax.  IMPRESSION: Cardiomegaly with mild to moderate interstitial edema, increased.  Moderate layering left pleural effusion.  Underlying left upper lobe pneumonia not excluded.   Original Report Authenticated By: Charline Bills, M.D.    Dg Chest Port 1 View  01/08/2013  *RADIOLOGY REPORT*  Clinical Data: Hypoxia.  PORTABLE CHEST - 1 VIEW  Comparison: CT chest 01/07/2013 and chest radiograph 01/01/2011.  Findings: Trachea is midline.  Heart size is accentuated by AP semi upright technique and low lung volumes.  There is central pulmonary vascular congestion with left perihilar and left lower lobe air space disease.  Mild diffuse interstitial prominence.  Small left pleural effusion.  IMPRESSION: Bilateral air space disease, left greater than right.  Left upper/left lower lobe pneumonia is queried. Probable underlying edema.   Original Report Authenticated By: Leanna Battles, M.D.    Dg Abd Portable 1v  01/30/2013  *RADIOLOGY REPORT*  Clinical Data: Nasogastric tube placement.  PORTABLE ABDOMEN - 1 VIEW  Comparison: CT exam 01/27/2013  Findings: Nasogastric tube is in place, tip overlying the level of the distal stomach.  Bowel gas pattern is  nonobstructive.  There is residual contrast in the colonic loops and rectosigmoid colon. Multiple colonic diverticula are present. Bowel loops overlie the left hip, consistent with abdominal wall laxity.  The patient has had previous spinal surgery.  Surgical clips are present in the right upper quadrant of the abdomen.  IMPRESSION: Nasogastric tube tip overlying the level of the distal stomach.   Original Report Authenticated By: Norva Pavlov, M.D.    Dg Abd Portable 1v  01/08/2013  *RADIOLOGY REPORT*  Clinical Data: Pancreatitis, abdominal pain and vomiting.  PORTABLE ABDOMEN - 1 VIEW  Comparison: CT of the abdomen and pelvis from 01/05/2013.  Findings: There is no evidence of bowel obstruction or significant ileus.  No gross signs of free air.  Extensive lumbar fusion hardware noted.  No abnormal calcifications identified.  IMPRESSION: No acute findings.   Original Report Authenticated By: Irish Lack, M.D.    Dg Intro Long Gi Tube  02/01/2013  *RADIOLOGY REPORT*  Clinical Data: Pancreatitis.  Panda tube placement  INTRO LONG GI TUBE  Technique: Under fluoroscopic guidance a feeding tube was introduced into the second portion of  the duodenum.  Placement was confirmed by injection of oral contrast.  Comparison:  None.  Findings: Under fluoroscopic guidance a feeding tube was introduced into the second portion of  the duodenum.  Placement was confirmed by injection of oral contrast. There is some redundant tube within the stomach. Stylet removed.  IMPRESSION: Placement of feeding tube within the second portion of the  duodenum.   Original Report Authenticated By: Genevive Bi, M.D.          Subjective: Patient is tolerating tube feeds. She denies any fevers, chills, chest pain, shortness breath, nausea, vomiting, diarrhea. Abdominal pain is relatively well controlled.  Objective: Filed Vitals:   02/01/13 2158 02/02/13 0500 02/02/13 0546 02/02/13 1419  BP: 106/61  103/58 113/93  Pulse:  86  68 78  Temp: 98 F (36.7 C)  98.3 F (36.8 C) 97.6 F (36.4 C)  TempSrc: Oral  Oral Oral  Resp: 20  18 18   Height:      Weight:  120.203 kg (265 lb)    SpO2: 98%  97% 98%    Intake/Output Summary (Last 24 hours) at 02/02/13 1932 Last data filed at 02/02/13 1555  Gross per 24 hour  Intake 3025.44 ml  Output    700 ml  Net 2325.44 ml   Weight change:  Exam:   General:  Pt is alert, follows commands appropriately, not in acute distress  HEENT: No icterus, No thrush,  [place]/AT  Cardiovascular: RRR, S1/S2, no rubs, no gallops  Respiratory: CTA bilaterally, no wheezing, no crackles, no rhonchi  Abdomen: Soft/+BS, non tender, non distended, no guarding  Extremities: trace edema, No lymphangitis, No petechiae, No rashes, no synovitis  Data Reviewed: Basic Metabolic Panel:  Lab 02/01/13 1610 01/30/13 0515 01/29/13 0500 01/28/13 0500 01/27/13 0511  NA 137 137 138 134* 138  K 3.9 3.8 3.4* 4.1 4.9  CL 102 101 99 96 99  CO2 28 29 28 29  32  GLUCOSE 120* 156* 113* 98 108*  BUN 13 10 9 9 13   CREATININE 0.57 0.58 0.57 0.65 0.77  CALCIUM 8.9 8.8 9.0 9.2 9.7  MG 1.9 -- 2.0 2.1 --  PHOS 3.7 -- 3.6 3.7 --   Liver Function Tests:  Lab 02/01/13 0850 01/29/13 0500 01/28/13 0500 01/27/13 0511  AST 13 24 51* 98*  ALT 22 52* 76* 97*  ALKPHOS 97 124* 151* 157*  BILITOT 0.3 0.4 0.4 0.4  PROT 6.5 6.1 6.4 6.9  ALBUMIN 2.9* 2.9* 2.9* 3.1*    Lab 01/29/13 0500 01/28/13 0500 01/27/13 0511  LIPASE 97* 104* 195*  AMYLASE -- -- --   No results found for this basename: AMMONIA:5 in the last 168 hours CBC:  Lab 02/01/13 0850 01/29/13 0500 01/27/13 0511  WBC 5.7 5.4 7.3  NEUTROABS -- 3.0 --  HGB 12.9 12.6 13.5  HCT 39.5 38.8 42.4  MCV 89.8 89.0 89.6  PLT 186 214 301   Cardiac Enzymes: No results found for this basename: CKTOTAL:5,CKMB:5,CKMBINDEX:5,TROPONINI:5 in the last 168 hours BNP: No components found with this basename: POCBNP:5 CBG:  Lab 02/02/13 1806 02/02/13 1142  02/02/13 0610 02/02/13 0008 02/01/13 1823  GLUCAP 102* 125* 141* 123* 118*    No results found for this or any previous visit (from the past 240 hour(s)).   Scheduled Meds:   . alteplase  2 mg Intracatheter Once  . feeding supplement (VITAL 1.5 CAL)  1,000 mL Per Tube Q24H  . insulin aspart  0-9 Units Subcutaneous Q6H  . lidocaine  1 patch Transdermal Q24H  . lipase/protease/amylase  2 capsule Oral TID AC  . morphine  15 mg Oral Q12H  . pantoprazole (PROTONIX) IV  40 mg Intravenous Q24H  . sodium chloride  10-40 mL Intracatheter Q12H  . sodium chloride  3 mL Intravenous Q12H   Continuous Infusions:   . sodium chloride 35 mL/hr at 02/01/13 0548  Adrean Heitz, DO  [facility] Pager [phone]  If 7PM-7AM, please contact night-coverage [email or web address] Password [facility] 02/02/2013, 7:32 PM   LOS: 7 days

## 2013-02-02 NOTE — Progress Notes (Signed)
  Pharmacy Note (Brief) - D/C TNA  Pt tolerating TFs, ok to wean TNA off per Dr. Arbutus Leas. Had RN decrease rate to 65ml/hr at 10am with plan to turn off completely at noon today.   Currently has SSI 4x a day ordered.  1) Further CBG, electrolyte, and IVF management per MD. 2) Pharmacy will sign off.   Darrol Angel, PharmD Pager: [phone] 02/02/2013 10:45 AM

## 2013-02-02 NOTE — Progress Notes (Signed)
Chaplain provided support and grief work with pt.   Pt's husband Earl Lites) died unexpectedly 03/31/2012.  Pt is preparing for first valentines day without husband and first anniversary of husband's death.

## 2013-02-03 ENCOUNTER — Inpatient Hospital Stay (HOSPITAL_COMMUNITY): Payer: Medicare Other

## 2013-02-03 DIAGNOSIS — M79609 Pain in unspecified limb: Secondary | ICD-10-CM

## 2013-02-03 DIAGNOSIS — R071 Chest pain on breathing: Secondary | ICD-10-CM

## 2013-02-03 DIAGNOSIS — R0789 Other chest pain: Secondary | ICD-10-CM

## 2013-02-03 LAB — MAGNESIUM: Magnesium: 1.9 mg/dL (ref 1.5–2.5)

## 2013-02-03 LAB — BASIC METABOLIC PANEL
Chloride: 98 mEq/L (ref 96–112)
Creatinine, Ser: 0.51 mg/dL (ref 0.50–1.10)
GFR calc Af Amer: >90 mL/min (ref >90)
Potassium: 3.9 mEq/L (ref 3.5–5.1)
Sodium: 135 mEq/L (ref 135–145)

## 2013-02-03 LAB — GLUCOSE, CAPILLARY
Glucose-Capillary: 120 mg/dL — ABNORMAL HIGH (ref 70–99)
Glucose-Capillary: 122 mg/dL — ABNORMAL HIGH (ref 70–99)

## 2013-02-03 LAB — PHOSPHORUS: Phosphorus: 3.1 mg/dL (ref 2.3–4.6)

## 2013-02-03 MED ORDER — OXYCODONE HCL ER 15 MG PO T12A
15.0000 mg | EXTENDED_RELEASE_TABLET | Freq: Two times a day (BID) | ORAL | Status: DC
  Administered 2013-02-03 – 2013-02-04 (×3): 15 mg via ORAL
  Filled 2013-02-03 (×3): qty 1

## 2013-02-03 MED ORDER — HYDROMORPHONE HCL PF 1 MG/ML IJ SOLN
1.0000 mg | INTRAMUSCULAR | Status: DC | PRN
  Administered 2013-02-03 – 2013-02-06 (×15): 1 mg via INTRAVENOUS
  Filled 2013-02-03 (×14): qty 1

## 2013-02-03 NOTE — Progress Notes (Signed)
[facility] Progress Note  Subjective: Patient still complaining of moderate abdominal pain, pain management currently by palliative care team. Also complaining of right shoulder pain  Objective: Vital signs in last 24 hours: Temp:  [97.6 F (36.4 C)-98.6 F (37 C)] 98 F (36.7 C) (02/08 0550) Pulse Rate:  [78-88] 88 (02/08 0550) Resp:  [18-20] 20 (02/08 0550) BP: (113-124)/(78-93) 116/82 mmHg (02/08 0550) SpO2:  [96 %-98 %] 97 % (02/08 0550) Weight change:    PE: Looks moderately uncomfortable, abdomen obese moderately tender  Lab Results: Results for orders placed during the hospital encounter of 01/26/13 (from the past 24 hour(s))  GLUCOSE, CAPILLARY     Status: Abnormal   Collection Time    02/02/13 11:42 AM      Result Value Range   Glucose-Capillary 125 (*) 70 - 99 mg/dL  GLUCOSE, CAPILLARY     Status: Abnormal   Collection Time    02/02/13  6:06 PM      Result Value Range   Glucose-Capillary 102 (*) 70 - 99 mg/dL  GLUCOSE, CAPILLARY     Status: Abnormal   Collection Time    02/02/13 11:50 PM      Result Value Range   Glucose-Capillary 110 (*) 70 - 99 mg/dL  BASIC METABOLIC PANEL     Status: Abnormal   Collection Time    02/03/13  3:35 AM      Result Value Range   Sodium 135  135 - 145 mEq/L   Potassium 3.9  3.5 - 5.1 mEq/L   Chloride 98  96 - 112 mEq/L   CO2 29  19 - 32 mEq/L   Glucose, Bld 122 (*) 70 - 99 mg/dL   BUN 14  6 - 23 mg/dL   Creatinine, Ser 1.61  0.50 - 1.10 mg/dL   Calcium 8.8  8.4 - 09.6 mg/dL   GFR calc non Af Amer >90  >90 mL/min   GFR calc Af Amer >90  >90 mL/min  MAGNESIUM     Status: None   Collection Time    02/03/13  3:35 AM      Result Value Range   Magnesium 1.9  1.5 - 2.5 mg/dL  PHOSPHORUS     Status: None   Collection Time    02/03/13  3:35 AM      Result Value Range   Phosphorus 3.1  2.3 - 4.6 mg/dL  GLUCOSE, CAPILLARY     Status: Abnormal   Collection Time    02/03/13  6:19 AM      Result Value Range   Glucose-Capillary 120 (*) 70 - 99 mg/dL    Studies/Results: Dg Intro Long Gi Tube  02/01/2013  *RADIOLOGY REPORT*  Clinical Data: Pancreatitis.  Panda tube placement  INTRO LONG GI TUBE  Technique: Under fluoroscopic guidance a feeding tube was introduced into the second portion of  the duodenum.  Placement was confirmed by injection of oral contrast.  Comparison:  None.  Findings: Under fluoroscopic guidance a feeding tube was introduced into the second portion of  the duodenum.  Placement was confirmed by injection of oral contrast. There is some redundant tube within the stomach. Stylet removed.  IMPRESSION: Placement of feeding tube within the second portion of the duodenum.   Original Report Authenticated By: Genevive Bi, M.D.       Assessment: 1. Pancreatitis with pseudocyst requiring nasoenteric feeds 2. Abdominal pain related to the above. 3. Elevated LFTs with no common bile duct stones by ultrasound  Plan: Continue tube feeds currently at goal rate of 55 cc per hour Continue n.p.o. ice chips Will obtain KUB to recheck Panda tube positioning Will followup on results.    Meily Glowacki C 02/03/2013, 8:06 AM

## 2013-02-03 NOTE — Progress Notes (Signed)
Subjective: Patient seen , still c/o abdominal and back pain. Says the pain medications are not helping. Patient was started on MS contin and prn morphine yesterday. She rates the pain as 7/10 in intesity in the abdomen and 8/10 in back and right sided chest pain. Xray of shoulder was negative for any pathology.  Filed Vitals:   02/03/13 0550  BP: 116/82  Pulse: 88  Temp: 98 F (36.7 C)  Resp: 20    Chest: Clear Bilaterally Heart : S1S2 RRR Abdomen: Soft, nontender Ext : No edema Neuro: Alert, oriented x 3 Ext: No pain in shoulder elicited  On movement, patient has  Upper right sided chest pain and is tender to palpation  A/P  Chest pain Stat CXR ? Pneumothorax EKG Dilaudid prn for pain  Chronic pain D/c MS contin and po morphine Start Oxycontin  15 mg po BID Dilaudid 1 mg IV q 4 hr prn   Meredeth Ide [facility]/Palliative Medicine Team [phone]

## 2013-02-03 NOTE — Progress Notes (Signed)
VASCULAR LAB PRELIMINARY  PRELIMINARY  PRELIMINARY  PRELIMINARY  Right upper extremity venous Doppler completed.    Preliminary report:  There is no DVT noted in the left upper extremity.  There is superficial thrombosis noted in the basilic vein of the left upper extremity at the PICC site.  Tilman Mcclaren, RVT 02/03/2013, 7:11 PM

## 2013-02-03 NOTE — Progress Notes (Signed)
Pt still complaining about pain in upper rt arm. Called MD and IV team to assess. IV team member ascertained no swelling, warmth at site. MD also assessed pt with same dx. He did order xray of shoulder, as pt has had rotator cuff surgery in recent past. I made pt as comfrtable as possible and I will reassess her in one hour

## 2013-02-03 NOTE — Progress Notes (Addendum)
Doppler shows superficial basilic thrombus at PICC site. Notified on call MD and IVT as to decision on whether line is to remain in place or be removed. MD ordered PICC to stay in place as thrombus is superficial. I will monitor pt for any signs of changes.

## 2013-02-03 NOTE — Progress Notes (Signed)
[facility] PROGRESS NOTE  [name] [name] NWG:[id number] DOB: [birth year] DOA: 01/26/2013 PCP: Kaleen Mask, MD  Assessment/Plan: Subacute pancreatitis/pancreatic pseudocyst/abdominal pain  -In the setting of recent gallstone pancreatitis  -Status post cholecystectomy 01/11/2013 secondary to gallstone pancreatitis  -Intraoperative cholangiogram suggested choledocholithiasis  -MRCP cannot be performed due to the patient's body habitus  -CT abdomen and pelvis January 16th and 01/27/2013 revealed pancreatic pseudocysts  -Patient readmitted 01/27/2013 due to increasing abdominal pain  -Endoscopy ultrasound 01/29/13 showing multiple pancreatic pseudocyst without any CBD obstruction--scattered peripancreatic edema and cystic collections. No obvious mass noted. No evidence of bile stones or thickening noted with normal ampulla  -Appreciate GI followup  -dobbhoff tube placed today(02/01/13), start TF  -Continue intravenous hydromorphone for now  -Necessity for Dobbhoff tube may preclude use of long-acting opioids due to the need of crushing the medications  -If patient is able to take pills with sips of water, long-acting opioids may be able to be used (MS Contin vs oxycontin)  -Appreciate Dr. Lubertha Basque assistance  -Continue MS Contin 15 mg every 12 hours; continue Roxanol solution for breakthrough pain;  -Discontinue IV Dilaudid  -continue prn ativan  -Continue when necessary antiemetics  -Increase activity--PT eval and treat  Pain management -per Palliative medicine -changed to oxyContin today (02/13/13) and IV hydromorphone Chest pain-right infraclavicular -Suspect musculoskeletal -Pain is 100% reproducible -EKG is reassuring -Wells score--1.5  -Chest x-ray and right shoulder x-rays are negative Right arm pain -Unclear etiology -Suspect musculoskeletal -Venous duplex rule out DVT although clinical suspicion is low -Neurovascularly intact -Referred pain from right  shoulder??--Had right rotator cuff repair 10/11/2012 Hypokalemia  -Repleted  -Check magnesium--1.9  Back spasms  -Convert to oral Robaxin  Nutrition  -Patient is currently tolerating tube feeds without significant pain worsening  -Recheck electrolytes in the morning  -D/C TPN (02/02/13), patient is at goal rate for tube feeds      Family Communication:   Pt at beside Disposition Plan:   Home when medically stable     Procedures/Studies: Dg Neck Soft Tissue  01/31/2013  *RADIOLOGY REPORT*  Clinical Data: The patient requires enteral feeding.  NECK SOFT TISSUES - 1+ VIEW  Comparison: None.  Findings: The patient was brought to radiology for fluoro guided feeding tube placement.  When the image intensifier was moved up over the region of the neck, the patient became extremely claustrophobic and was unable to continue with the procedure.  IMPRESSION: Unable to place a feeding tube secondary to the patient's claustrophobia.   Original Report Authenticated By: Kennith Center, M.D.    Dg Chest 2 View  02/03/2013  *RADIOLOGY REPORT*  Clinical Data: Right-sided chest pain  CHEST - 2 VIEW  Comparison: Portable chest x-ray of 02/01/2013  Findings: No active infiltrate or effusion is seen.  Right PICC line is present with the tip seen to the lower SVC.  No pneumothorax is noted.  The heart is within normal limits in size  IMPRESSION:   Right PICC line tip seen to lower SVC.  No pneumothorax.  No active lung disease.   Original Report Authenticated By: Dwyane Dee, M.D.    Dg Shoulder Right  02/03/2013  *RADIOLOGY REPORT*  Clinical Data: 57 year old female right shoulder pain and decreased range of motion.  RIGHT SHOULDER - 2+ VIEW  Comparison: None.  Findings: Right PICC line catheter identified, tip at the lower SVC level. No glenohumeral joint dislocation.  Proximal right humerus intact.  Right clavicle and scapula intact.  Visible right ribs and lung parenchyma within  normal limits. Bone mineralization  is within normal limits.  IMPRESSION: No acute osseous abnormality identified about the right shoulder.   Original Report Authenticated By: Erskine Speed, M.D.    Dg Abd 1 View  02/03/2013  *RADIOLOGY REPORT*  Clinical Data: 57 year old female tube placement.  ABDOMEN - 1 VIEW  Comparison: 02/01/2013 and earlier.  Findings: Feeding tube configuration is stable, the weighted tip appears to be in the descending portion of the duodenum as before. The stomach appears more decompressed, with more gastric redundancy of the tube. Nonobstructed bowel gas pattern.  Extensive postoperative changes of the lumbar spine again noted.  IMPRESSION: Stable feeding tube tip position in the descending portion of the duodenum.   Original Report Authenticated By: Erskine Speed, M.D.    Dg Cholangiogram Operative  01/11/2013  *RADIOLOGY REPORT*  Clinical Data:   Cholecystectomy.  INTRAOPERATIVE CHOLANGIOGRAM  Technique:  Cholangiographic images from the C-arm fluoroscopic device were submitted for interpretation post-operatively.  Please see the procedural report for the amount of contrast and the fluoroscopy time utilized.  Comparison:  Abdominal CT 01/06/2012  Findings:  The intrahepatic and extrahepatic bile ducts are dilated.  No contrast is identified within the duodenum.  There may be small filling defects within the common bile duct.  IMPRESSION: The biliary system is dilated and there is concern for a distal obstruction since no contrast is identified within the duodenum.  Small filling defects within the common bile duct may represent sludge or debris.   Original Report Authenticated By: Richarda Overlie, M.D.    Ct Angio Chest Pe W/cm &/or Wo Cm  01/07/2013  *RADIOLOGY REPORT*  Clinical Data: Left-sided flank pain.  Pain with inspiration.  CT ANGIOGRAPHY CHEST  Technique:  Multidetector CT imaging of the chest using the standard protocol during bolus administration of intravenous contrast. Multiplanar reconstructed images  including MIPs were obtained and reviewed to evaluate the vascular anatomy.  Contrast: 80mL OMNIPAQUE IOHEXOL 350 MG/ML SOLN  Comparison: Chest CT 09/03/2011.  Findings:  Mediastinum: Study is limited by large amount of patient respiratory motion.  With these limitations in mind, there is no evidence of central, lobar or proximal segmental sized pulmonary embolism.  Smaller distal segmental and subsegmental sized emboli cannot be completely excluded secondary to respiratory motion. Heart size is normal. There is no significant pericardial fluid, thickening or pericardial calcification.  No pathologically enlarged mediastinal or hilar lymph nodes. Esophagus is unremarkable in appearance.  Lungs/Pleura: Moderate left and trace right-sided pleural effusions.  Passive atelectasis in the dependent portion of the left lower lobe.  In addition, there is diffuse bronchial wall thickening with some thickening of the peribronchovascular interstitium and some patchy air space disease throughout the lungs bilaterally, predominately ground-glass in attenuation, suspicious for multifocal infection and/or inflammation.  This is relatively asymmetrically distributed.  Upper Abdomen: Perisplenic ascites.  Large amount of inflammatory changes in the retroperitoneum in the expected location of the tail of the pancreas, incompletely visualized.  Musculoskeletal: There are no aggressive appearing lytic or blastic lesions noted in the visualized portions of the skeleton.  IMPRESSION: 1.  Limited examination secondary to respiratory motion demonstrating no central, lobar or proximal segmental sized pulmonary embolism. 2.  The appearance of the lung parenchyma, as above, suggests multifocal infection and/or inflammation.  Specifically, the findings are most concerning for early changes of ARDS related to underlying pancreatitis. 3.  Moderate left and trace right-sided pleural effusions. 4.  Changes of pancreatitis incompletely visualized  in the upper abdomen.  Original Report Authenticated By: Trudie Reed, M.D.    US Abdomen Complete  01/04/2013  *RADIOLOGY REPORT*  Clinical Data:  Abdominal pain.  Nausea and vomiting.  Elevated liver function test.  Elevated lipase.  COMPLETE ABDOMINAL ULTRASOUND  Comparison:  01/09/2009 CT.  Findings:  Gallbladder:  Multiple gallstones are present.  Largest measures 9 mm.  These demonstrate typical posterior acoustic shadowing and increased echogenicity. There is no wall thickening.  No sonographic Murphy's sign.  Common bile duct:  Enlarged, measuring between 12 mm and 13 mm. There is no common duct stone identified.  Liver:  Mild intrahepatic biliary ductal dilation is present.  No focal mass lesion.  IVC:  Appears normal.  Pancreas:  Enlargement of the pancreatic head.  Again, no common duct stone is identified in the intrapancreatic distal common bile duct.  Spleen:  8.6 cm.  Normal echotexture.  Right Kidney:  12.2 cm. Normal echotexture.  Normal central sinus echo complex.  No calculi or hydronephrosis.  Left Kidney:  12.3 cm. Normal echotexture.  Normal central sinus echo complex.  No calculi or hydronephrosis.  Abdominal aorta:  2.4 cm.  IMPRESSION: 1.  Cholelithiasis without findings of acute cholecystitis. 2.  Dilated common bile duct with mild intrahepatic biliary ductal dilation.  There is no visualized common duct stone however with intra and extrahepatic biliary ductal dilation; findings suspicious for a distal common duct stone.  In the setting of elevated lipase, gallstone pancreatitis is likely. 3.  Enlargement of pancreatic head compatible with pancreatitis.   Original Report Authenticated By: Andreas Newport, M.D.    Ct Abdomen Pelvis W Contrast  01/27/2013  *RADIOLOGY REPORT*  Clinical Data: Status post cholecystectomy with postop choledocholithiasis.  Gallstone pancreatitis with known pseudocysts.  CT ABDOMEN AND PELVIS WITH CONTRAST  Technique:  Multidetector CT imaging of the abdomen  and pelvis was performed following the standard protocol during bolus administration of intravenous contrast.  Contrast:  100 ml Omnipaque-300 IV  Comparison: 01/15/2013  Findings: Linear scarring versus atelectasis at the left lung base. Trace left pleural effusion, improved.  Liver is within normal limits.  Spleen is notable for small volume perisplenic fluid, likely reflecting a pseudocyst, improved.  In addition, at least four distinct peripancreatic pseudocysts are present: --9.4 x 4.9 cm collection adjacent to the pancreatic tail (series 2/image 22), previously 10.9 x 5.6 cm --4.7 x 7.8 cm collection anterior to the pancreatic body (series 2/image 32), previously well defined, measuring 5.8 x 9.0 cm --2.9 x 2.5 cm collection inferior to the pancreatic head (series 2/image 46), previously 3.5 x 2.9 cm --4.7 x 4.0 cm collection in the right mid abdomen (series 2/image 5), previously 4.9 x 4.9 cm  While all of these collections have a thin rim, compatible with pseudocysts, none of them demonstrate a thickened rim to suggest abscess.  No evidence of pancreatic necrosis.  Status post cholecystectomy.  No intrahepatic ductal dilatation. Stable common bile duct, measuring up to 2.0 cm in diameter, but tapering at the ampulla.  Left adrenal gland is mildly nodular/thickened, unchanged.  Right adrenal gland is unremarkable.  Kidneys are within normal limits.  No hydronephrosis.  No evidence of bowel obstruction.  Large abdominal hernia containing multiple loops of small and large bowel, unchanged. Colonic diverticulosis, without associated inflammatory changes.  Atherosclerotic calcifications of the abdominal aorta and branch vessels.  No abdominopelvic ascites.  No suspicious abdominopelvic lymphadenopathy.  Status post hysterectomy.  Bilateral ovaries are unremarkable.  Bladder is within normal limits.  Degenerative changes of  the lumbar spine.  Status post PLIF from L2- 4.  Status post ALIF at L4-5 and L5-S1.   IMPRESSION: Multiple peripancreatic and perisplenic pseudocysts, as described above, mildly improved.  No evidence of abscess.  Status post cholecystectomy.  Stable common bile duct.  No intrahepatic ductal dilatation.  Large abdominal hernia containing multiple loops of small and large bowel.  No evidence of bowel obstruction.   Original Report Authenticated By: Charline Bills, M.D.    Ct Abdomen Pelvis W Contrast  01/15/2013  *RADIOLOGY REPORT*  Clinical Data: Acute pancreatitis.  CT ABDOMEN AND PELVIS WITH CONTRAST  Technique:  Multidetector CT imaging of the abdomen and pelvis was performed following the standard protocol during bolus administration of intravenous contrast.  Contrast: OMNIPAQUE IOHEXOL 300 MG/ML  SOLN  Comparison: 01/05/2013.  Findings: The lung bases demonstrate bilateral pleural effusions, left greater than right with overlying atelectasis.  These are new.  The liver demonstrates stable intrahepatic biliary dilatation.  No focal hepatic lesions.  The gallbladder is surgically absent. Stable common bile duct dilatation.  The spleen is mildly enlarged but but no focal lesions.  There is a large complex fluid collection surrounding the spleen with a thin enhancing membrane. This is most likely a pseudocyst.  There are also multiple complex pseudocyst surrounding the pancreas and in the splenic hilum and continuing down into the small bowel mesentery.  The pancreas demonstrates normal enhancement.  No findings to suggest pancreatic necrosis.  No hemorrhage or pseudoaneurysm.  The pancreatic duct is normal in caliber.  The adrenal glands and kidneys are unremarkable.  The stomach, duodenum, small bowel and colon grossly normal.  No findings for small bowel obstruction.  There is a large abdominal wall hernia containing small bowel and colon but no findings for obstruction/ incarceration.  There is extensive subcutaneous soft tissue swelling/edema which could suggest anasarca or  cellulitis.  The bladder is unremarkable.  No pelvic mass or significant free pelvic fluid collection.  Surgical changes involving the lumbar spine are again demonstrated.  IMPRESSION:  1.  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 2.  No definite findings for abscess and no evidence of pancreatic necrosis. 3.  New bilateral pleural effusions, left greater than right with overlying atelectasis. 4.  Stable biliary dilatation. 5.  Large abdominal wall hernia. 6.  New subcutaneous edema could suggest anasarca or cellulitis.   Original Report Authenticated By: Rudie Meyer, M.D.    Ct Abdomen Pelvis W Contrast  01/05/2013  *RADIOLOGY REPORT*  Clinical Data: Abdominal pain.  Nausea and vomiting.  CT ABDOMEN AND PELVIS WITH CONTRAST  Technique:  Multidetector CT imaging of the abdomen and pelvis was performed following the standard protocol during bolus administration of intravenous contrast.  Contrast: OMNIPAQUE IOHEXOL 300 MG/ML  SOLN  Comparison: CT of the abdomen and pelvis 01/09/2009.  Findings:  Lung Bases: Unremarkable.  Abdomen/Pelvis:  Gallbladder appears moderately distended and there is some pericholecystic fluid and stranding.  Mild intrahepatic biliary ductal dilatation.  Common bile duct also appears dilated measuring up to 15 mm in diameter in the porta hepatis.  No definite radiopaque stone is identified within the common bile duct, or within the lumen of the gallbladder.  The appearance of the liver is otherwise unremarkable.  Peripancreatic stranding is noted diffusely, suggesting pancreatitis.  There is a small amount of fluid surrounding the spleen.  The adrenal glands and kidneys are unremarkable in appearance bilaterally.  Atherosclerosis  throughout the abdominal and pelvic vasculature, without definite aneurysm or dissection.  Numerous colonic diverticula are noted, without definite surrounding inflammatory changes  to strongly suggest acute diverticulitis at this time.  The patient has a large ventral hernia inferiorly which contains a portion of the colon and multiple loops of small bowel.  No definite signs to suggest bowel incarceration or obstruction at this time.  Low-lying rectum well below the level of the pubococcygeal line, which could suggest rectal prolapse.  Status post hysterectomy.  Ovaries are atrophic. The urinary bladder is unremarkable in appearance.  Musculoskeletal: There are no aggressive appearing lytic or blastic lesions noted in the visualized portions of the skeleton. Anterior lumbar fixation at L4-L5 and L5-S1.  Status post laminectomy at L2, L3-L4 with PLIF from L2-L4.  Interbody grafts are present at L2-L3, L3-L4, L4-L5 and L5 - S1.  5 mm of retrolisthesis of L1 upon L2 is noted.  Alignment is otherwise anatomic.  IMPRESSION: 1.  Findings, as above, concerning for a the distal obstruction of the common bile duct, and possibly the pancreatic duct, with acute pancreatitis and possible acute cholecystitis. This could represent a stricture, recently passed a ductal stone, or a nonradiopaque stone in the distal common bile duct.  Clinical correlation is recommended. 2.  Extensive colonic diverticulosis without findings to suggest acute diverticulitis at this time.  3.  Large inferior ventral hernia containing portions of the colon and small bowel, without signs to suggest bowel obstruction at this time. 4.  Atherosclerosis. 5.  Extensive postoperative changes in the spine, as above, with 5 mm of retrolisthesis of L1 upon L2. 6.  Findings suggestive of rectal prolapse.  Clinical correlation may be warranted.   Original Report Authenticated By: Trudie Reed, M.D.    Dg Chest Port 1 View  02/01/2013  *RADIOLOGY REPORT*  Clinical Data: PICC line placement  PORTABLE CHEST - 1 VIEW  Comparison: 01/10/2013  Findings: Interval placement of a right PICC catheter with tip located over the lower SVC region.   No pneumothorax.  No pleural effusions.  Normal heart size and pulmonary vascularity.  No airspace disease or consolidation in the lungs.  IMPRESSION: PICC line appears to be in satisfactory position.  No pneumothorax. No active lung disease.   Original Report Authenticated By: Burman Nieves, M.D.    Dg Chest Port 1 View  01/10/2013  *RADIOLOGY REPORT*  Clinical Data: Follow-up  PORTABLE CHEST - 1 VIEW  Comparison: 01/09/2013  Findings: .  Lung volumes remain low.  To cardiac collapse / consolidation persists with left pleural effusion.  Vascular congestion again noted.  The radius trace amount of fluid in the minor fissure.  IMPRESSION: Slight improvement in overall aeration with persistent retrocardiac collapse / consolidation with left effusion.   Original Report Authenticated By: Kennith Center, M.D.    Dg Chest Port 1 View  01/09/2013  *RADIOLOGY REPORT*  Clinical Data: Follow up  PORTABLE CHEST - 1 VIEW  Comparison: None.  Findings: Cardiomegaly with mild to moderate interstitial edema, increased.  Worsening opacification of the left mid lung, underlying pneumonia not excluded.  Moderate layering left pleural effusion.  No pneumothorax.  IMPRESSION: Cardiomegaly with mild to moderate interstitial edema, increased.  Moderate layering left pleural effusion.  Underlying left upper lobe pneumonia not excluded.   Original Report Authenticated By: Charline Bills, M.D.    Dg Chest Port 1 View  01/08/2013  *RADIOLOGY REPORT*  Clinical Data: Hypoxia.  PORTABLE CHEST - 1 VIEW  Comparison: CT chest 01/07/2013  and chest radiograph 01/01/2011.  Findings: Trachea is midline.  Heart size is accentuated by AP semi upright technique and low lung volumes.  There is central pulmonary vascular congestion with left perihilar and left lower lobe air space disease.  Mild diffuse interstitial prominence.  Small left pleural effusion.  IMPRESSION: Bilateral air space disease, left greater than right.  Left upper/left lower  lobe pneumonia is queried. Probable underlying edema.   Original Report Authenticated By: Leanna Battles, M.D.    Dg Abd Portable 1v  01/30/2013  *RADIOLOGY REPORT*  Clinical Data: Nasogastric tube placement.  PORTABLE ABDOMEN - 1 VIEW  Comparison: CT exam 01/27/2013  Findings: Nasogastric tube is in place, tip overlying the level of the distal stomach.  Bowel gas pattern is nonobstructive.  There is residual contrast in the colonic loops and rectosigmoid colon. Multiple colonic diverticula are present. Bowel loops overlie the left hip, consistent with abdominal wall laxity.  The patient has had previous spinal surgery.  Surgical clips are present in the right upper quadrant of the abdomen.  IMPRESSION: Nasogastric tube tip overlying the level of the distal stomach.   Original Report Authenticated By: Norva Pavlov, M.D.    Dg Abd Portable 1v  01/08/2013  *RADIOLOGY REPORT*  Clinical Data: Pancreatitis, abdominal pain and vomiting.  PORTABLE ABDOMEN - 1 VIEW  Comparison: CT of the abdomen and pelvis from 01/05/2013.  Findings: There is no evidence of bowel obstruction or significant ileus.  No gross signs of free air.  Extensive lumbar fusion hardware noted.  No abnormal calcifications identified.  IMPRESSION: No acute findings.   Original Report Authenticated By: Irish Lack, M.D.    Dg Intro Long Gi Tube  02/01/2013  *RADIOLOGY REPORT*  Clinical Data: Pancreatitis.  Panda tube placement  INTRO LONG GI TUBE  Technique: Under fluoroscopic guidance a feeding tube was introduced into the second portion of  the duodenum.  Placement was confirmed by injection of oral contrast.  Comparison:  None.  Findings: Under fluoroscopic guidance a feeding tube was introduced into the second portion of  the duodenum.  Placement was confirmed by injection of oral contrast. There is some redundant tube within the stomach. Stylet removed.  IMPRESSION: Placement of feeding tube within the second portion of the duodenum.    Original Report Authenticated By: Genevive Bi, M.D.          Subjective: Patient complains of right-sided anterior chest wall pain below her clavicle. Denies any shortness of breath, dizziness, diaphoresis, nausea, vomiting. Denies fevers, chills, abdominal pain, dysuria, hematuria. Does also complain of right arm pain. She had right shoulder surgery 10/11/2013.  Objective: Filed Vitals:   02/02/13 1419 02/02/13 2116 02/03/13 0550 02/03/13 1500  BP: 113/93 124/78 116/82 112/62  Pulse: 78 88 88 86  Temp: 97.6 F (36.4 C) 98.6 F (37 C) 98 F (36.7 C) 98.7 F (37.1 C)  TempSrc: Oral Oral Oral Oral  Resp: 18 20 20 18   Height:      Weight:      SpO2: 98% 96% 97% 95%    Intake/Output Summary (Last 24 hours) at 02/03/13 1646 Last data filed at 02/03/13 0936  Gross per 24 hour  Intake      0 ml  Output   1650 ml  Net  -1650 ml   Weight change:  Exam:   General:  Pt is alert, follows commands appropriately, not in acute distress  HEENT: No icterus, No thrush, No neck mass, Pace/AT  Cardiovascular: RRR, S1/S2, no rubs,  no gallops  Respiratory: CTA bilaterally, no wheezing, no crackles, no rhonchi  Abdomen: Soft/+BS, non tender, non distended, no guarding  Extremities: No edema, No lymphangitis, No petechiae, No rashes, no synovitis  Right arm PICC line without any erythema or drainage. No induration or lymphangitis. No crepitance. No open wounds. Tenderness to palpation in the tricep area. No right axillary lymphadenopathy. Right anterior chest wall tenderness to palpation inferior to the clavicle and lateral to the sternum without any erythema, edema, crepitance, necrosis.   Data Reviewed: Basic Metabolic Panel:  Recent Labs Lab 01/28/13 0500 01/29/13 0500 01/30/13 0515 02/01/13 0850 02/03/13 0335  NA 134* 138 137 137 135  K 4.1 3.4* 3.8 3.9 3.9  CL 96 99 101 102 98  CO2 29 28 29 28 29   GLUCOSE 98 113* 156* 120* 122*  BUN 9 9 10 13 14   CREATININE 0.65  0.57 0.58 0.57 0.51  CALCIUM 9.2 9.0 8.8 8.9 8.8  MG 2.1 2.0  --  1.9 1.9  PHOS 3.7 3.6  --  3.7 3.1   Liver Function Tests:  Recent Labs Lab 01/28/13 0500 01/29/13 0500 02/01/13 0850  AST 51* 24 13  ALT 76* 52* 22  ALKPHOS 151* 124* 97  BILITOT 0.4 0.4 0.3  PROT 6.4 6.1 6.5  ALBUMIN 2.9* 2.9* 2.9*    Recent Labs Lab 01/28/13 0500 01/29/13 0500  LIPASE 104* 97*   No results found for this basename: AMMONIA,  in the last 168 hours CBC:  Recent Labs Lab 01/29/13 0500 02/01/13 0850  WBC 5.4 5.7  NEUTROABS 3.0  --   HGB 12.6 12.9  HCT 38.8 39.5  MCV 89.0 89.8  PLT 214 186   Cardiac Enzymes: No results found for this basename: CKTOTAL, CKMB, CKMBINDEX, TROPONINI,  in the last 168 hours BNP: No components found with this basename: POCBNP,  CBG:  Recent Labs Lab 02/02/13 1142 02/02/13 1806 02/02/13 2350 02/03/13 0619 02/03/13 1209  GLUCAP 125* 102* 110* 120* 105*    No results found for this or any previous visit (from the past 240 hour(s)).   Scheduled Meds: . alteplase  2 mg Intracatheter Once  . feeding supplement (VITAL 1.5 CAL)  1,000 mL Per Tube Q24H  . insulin aspart  0-9 Units Subcutaneous Q6H  . lidocaine  1 patch Transdermal Q24H  . lipase/protease/amylase  2 capsule Oral TID AC  . OxyCODONE  15 mg Oral Q12H  . pantoprazole (PROTONIX) IV  40 mg Intravenous Q24H  . sodium chloride  10-40 mL Intracatheter Q12H  . sodium chloride  3 mL Intravenous Q12H   Continuous Infusions: . sodium chloride 35 mL/hr at 02/03/13 1411     Melquiades Kovar, DO  [facility] Pager [phone]  If 7PM-7AM, please contact night-coverage [email or web address] Password [facility] 02/03/2013, 4:46 PM   LOS: 8 days

## 2013-02-03 NOTE — Progress Notes (Signed)
[facility] progress note. Chief complaint. Right shoulder pain. History of present illness. This 57 year old female in hospital with subacute pancreatitis/pancreatic pseudocyst/abdominal pain. She complained to the nurse about increased right shoulder pain. The patient to was noted to have a PICC line in the right upper arm. Patient also noted to be status post shoulder arthroscopically repaired. This listed completed 01/05/13. I came to the bedside to evaluate the patient with a concern of possible infection or blood clot involving the right side PICC line. IV team also evaluated the site prior to my arrival and felt that there was no indication of infection or blood clot involving the right arm PICC line. The patient states pain has been ongoing for about 48 hours but she is not sure if she is told any medical personnel about this until now. She denies any recent injury to the shoulder such as a fall or other such,. Vital signs. Temperature 98.6, pulse 88, respiration 20, blood pressure 124/78. O2 sats 96%. General appearance. Obese middle-aged female who is alert, cooperative, and in no distress. Cardiac. Rate and rhythm regular. Lungs. Clear. Abdomen. Soft and obese positive bowel sounds. Extremity. The right upper arm PICC site is free from erythema, edema, or any purulent discharge. The arm itself does not appear to have any significant erythema or edema. There is pain with palpation over the upper triceps of the right arm. Range of motion is decreased at the shoulder and certainly there is up pain with passive range of motion. Impression/plan. Problem #1. Right shoulder pain. Etiology here is not clear. There is no obvious indication of a PICC line involvement in the pain. The pain appears much higher up and ventral to the PICC line. There is no real suggestion here of infection or blood clot. I will proceed with a plain film x-ray of the right shoulder to initiate the workup.

## 2013-02-03 NOTE — Progress Notes (Signed)
C/O of soreness under RU arm just below where PICC is placed. Otherwise site WNL.   Warm pack applied. Primary RN notified. Christeen Douglas RN [facility]

## 2013-02-03 NOTE — Progress Notes (Addendum)
Pt complains of pain in posterior right upper arm on opposite side of PICC. She stated that she has had this pain for the past 2 days.  I assessed area of concern; no redness, swelling, warmth noted. Used K-Pad for lower back pain and rt shoulder to reduce discomfort. I will reassess her pain in an hour.

## 2013-02-04 LAB — GLUCOSE, CAPILLARY: Glucose-Capillary: 130 mg/dL — ABNORMAL HIGH (ref 70–99)

## 2013-02-04 MED ORDER — CYCLOBENZAPRINE HCL 10 MG PO TABS
10.0000 mg | ORAL_TABLET | Freq: Three times a day (TID) | ORAL | Status: DC
  Administered 2013-02-04 – 2013-02-06 (×7): 10 mg via ORAL
  Filled 2013-02-04 (×9): qty 1

## 2013-02-04 MED ORDER — METHOCARBAMOL 750 MG PO TABS
750.0000 mg | ORAL_TABLET | Freq: Three times a day (TID) | ORAL | Status: DC
  Filled 2013-02-04 (×3): qty 1

## 2013-02-04 MED ORDER — LORAZEPAM 0.5 MG PO TABS
0.5000 mg | ORAL_TABLET | Freq: Four times a day (QID) | ORAL | Status: DC | PRN
  Administered 2013-02-04 – 2013-02-06 (×2): 0.5 mg via ORAL
  Filled 2013-02-04 (×2): qty 1

## 2013-02-04 MED ORDER — LORAZEPAM 0.5 MG PO TABS
0.5000 mg | ORAL_TABLET | Freq: Every day | ORAL | Status: DC
  Administered 2013-02-04 – 2013-02-05 (×2): 0.5 mg via ORAL
  Filled 2013-02-04 (×3): qty 1

## 2013-02-04 NOTE — Evaluation (Addendum)
Physical Therapy Evaluation Patient Details Name: [name] [name] MRN: [id number] DOB: 1956/11/27 Today's Date: 02/04/2013 Time: 4098-1191 PT Time Calculation (min): 31 min  PT Assessment / Plan / Recommendation Clinical Impression  57 y.o. female who was just recently discharged after being treated for gallstone pancreatitis and at that time had laparoscopic cholecystectomy and cholangiogram were showing possibility of choledocholithiasis. Now admitted for abdominal pain. Pt has PANDA feeding tube. Mobility is limited by severe muscle spasms in her back which she reports are chronic (she's had multiple back surgeries) but are now exacerbated. Pt transferred bed to chair with RW, activity limited by back spasms. Pt would benefit from acute PT to maximize safety and independence with mobility.     PT Assessment       Follow Up Recommendations  SNF;Home health PT;CIR    Does the patient have the potential to tolerate intense rehabilitation      Barriers to Discharge        Equipment Recommendations  None recommended by PT    Recommendations for Other Services Rehab consult   Frequency Min 3X/week    Precautions / Restrictions Precautions Precautions: Other (comment) Precaution Comments: Keep HOB elevated 2* PANDA  Restrictions Weight Bearing Restrictions: No   Pertinent Vitals/Pain *8/10 LBP Heating pad applied, pain meds requested**      Mobility  Bed Mobility Bed Mobility: Rolling Left;Left Sidelying to Sit Rolling Left: 4: Min assist Left Sidelying to Sit: 1: +2 Total assist Left Sidelying to Sit: Patient Percentage: 60% Transfers Transfers: Sit to Stand;Stand to Sit;Stand Pivot Transfers Sit to Stand: 4: Min guard;From bed Stand to Sit: 4: Min guard;To chair/3-in-1 Stand Pivot Transfers: 4: Min guard Details for Transfer Assistance: min/guard for SPT to recliner using RW, pt had lumbar muscle spasms with movement, stated these are chronic but now  exacerbated Ambulation/Gait Ambulation/Gait Assistance: Not tested (comment)    Exercises     PT Diagnosis: Difficulty walking;Generalized weakness;Acute pain  PT Problem List: Decreased activity tolerance;Decreased mobility;Obesity;Pain PT Treatment Interventions: Gait training;Functional mobility training;Therapeutic exercise;Therapeutic activities;Patient/family education   PT Goals Acute Rehab PT Goals PT Goal Formulation: With patient Time For Goal Achievement: 02/18/13 Potential to Achieve Goals: Fair Pt will go Supine/Side to Sit: with modified independence PT Goal: Supine/Side to Sit - Progress: Goal set today Pt will go Sit to Stand: with modified independence PT Goal: Sit to Stand - Progress: Goal set today Pt will Transfer Bed to Chair/Chair to Bed: with supervision PT Transfer Goal: Bed to Chair/Chair to Bed - Progress: Goal set today Pt will Ambulate: 16 - 50 feet;with supervision;with rolling walker PT Goal: Ambulate - Progress: Goal set today Pt will Perform Home Exercise Program: with min assist PT Goal: Perform Home Exercise Program - Progress: Goal set today  Visit Information  Last PT Received On: 02/04/13 Assistance Needed: +2    Subjective Data  Subjective: "I haven't been able to get up and move around and that makes my back seize up more." Patient Stated Goal: to walk more   Prior Functioning  Home Living Lives With: Alone Available Help at Discharge: Family;Available PRN/intermittently Type of Home: House Home Access: Ramped entrance Home Layout: One level Bathroom Shower/Tub: Tub/shower unit;[employer]: Standard Bathroom Accessibility: Yes How Accessible: Accessible via walker Home Adaptive Equipment: Straight cane;Walker - rolling;Grab bars around toilet Prior Function Level of Independence: Independent with assistive device(s) Able to Take Stairs?: Yes Driving: Yes Vocation: On disability Communication Communication: No  difficulties Dominant Hand: Right  Cognition  Cognition Overall Cognitive Status: Appears within functional limits for tasks assessed/performed Arousal/Alertness: Awake/alert Orientation Level: Appears intact for tasks assessed Behavior During Session: [facility] for tasks performed    Extremity/Trunk Assessment Right Lower Extremity Assessment RLE ROM/Strength/Tone: Within functional levels RLE Sensation: WFL - Light Touch RLE Coordination: WFL - gross/fine motor Left Lower Extremity Assessment LLE ROM/Strength/Tone: Within functional levels LLE Sensation: WFL - Light Touch LLE Coordination: WFL - gross/fine motor Trunk Assessment Trunk Assessment: Normal   Balance Balance Balance Assessed: Yes Static Sitting Balance Static Sitting - Balance Support: Feet supported;No upper extremity supported Static Sitting - Level of Assistance: 6: Modified independent (Device/Increase time) Static Sitting - Comment/# of Minutes: 3  End of Session PT - End of Session Activity Tolerance: Patient limited by pain Patient left: in chair;with call bell/phone within reach Nurse Communication: Mobility status;Patient requests pain meds  GP     Ralene Bathe Kistler 02/04/2013, 3:20 PM  [phone]

## 2013-02-04 NOTE — Progress Notes (Signed)
Subjective: Patient seen , still c/o abdominal and back pain. Says the pain medications are not helping. Patient was started on MS contin and prn morphine yesterday. She rates the pain as 7/10 in intesity in the abdomen and 8/10 in back and right sided chest pain. Xray of shoulder was negative for any pathology.  Basilic vein superficial thrombus noted on the venous duplex..Today complains of muscle spasms in back.  Filed Vitals:   02/04/13 0534  BP: 99/49  Pulse: 84  Temp: 98 F (36.7 C)  Resp: 18    Chest: Clear Bilaterally Heart : S1S2 RRR Abdomen: Soft, nontender Ext : No edema Neuro: Alert, oriented x 3 Ext: No pain in shoulder elicited  On movement, patient has  Upper right sided chest pain and is tender to palpation  A/P  Superficial thrombus in basilic vein Picc line to be removed today.  Chronic pain D/c MS contin and po morphine Start Oxycontin  15 mg po BID Dilaudid 1 mg IV q 4 hr prn  Back muscle spasm Change the robaxin to flexeril 10 mg po tid    Meredeth Ide [facility] Pager- [phone]

## 2013-02-04 NOTE — Progress Notes (Signed)
[facility] PROGRESS NOTE  [name] [name] LKG:[id number] DOB: 06-Aug-1956 DOA: 01/26/2013 PCP: Kaleen Mask, MD  Assessment/Plan: Subacute pancreatitis/pancreatic pseudocyst/abdominal pain  -In the setting of recent gallstone pancreatitis  -Status post cholecystectomy 01/11/2013 secondary to gallstone pancreatitis  -Intraoperative cholangiogram suggested choledocholithiasis  -MRCP cannot be performed due to the patient's body habitus  -CT abdomen and pelvis January 16th and 01/27/2013 revealed pancreatic pseudocysts  -Patient readmitted 01/27/2013 due to increasing abdominal pain  -Endoscopic ultrasound 01/29/13 showing multiple pancreatic pseudocyst without any CBD obstruction--scattered peripancreatic edema and cystic collections. No obvious mass noted. No evidence of bile stones or thickening noted with normal ampulla  -Appreciate GI followup  -dobbhoff tube placed today(02/01/13), tolerating TF  -If patient is able to take pills with sips of water, long-acting opioids may be able to be used (MS Contin vs oxycontin)  -Appreciate Dr. Lubertha Basque assistance  -continue prn ativan  -Continue when necessary antiemetics  -Increase activity--PT eval and treat  Pain management  -per Palliative medicine  -changed to oxyContin today (02/13/13) and IV hydromorphone  Chest pain-right infraclavicular  -Suspect musculoskeletal  -Pain is 100% reproducible  -EKG is reassuring  -Wells score--1.5  -Chest x-ray and right shoulder x-rays are negative  Right arm pain/superficial thrombus--basilic vein -Unclear etiology  -venous duplex shows superficial thrombus at basilic v. -Neurovascularly intact  -Referred pain from right shoulder??--Had right rotator cuff repair 10/11/2012  -Place peripheral IV -Discontinue PICC line once peripheral IV established Hypokalemia  -Repleted  -Check magnesium--1.9  Back spasms  -Convert to oral Robaxin-schedule 750 mg every 8 hours -Ativan 0.5 mg each  bedtime -Continue Ativan 0.5 mg every 6 hours when necessary Nutrition  -Patient is currently tolerating tube feeds without significant pain worsening  -Recheck electrolytes in the morning  -D/C TPN (02/02/13), patient is at goal rate for tube feeds    Family Communication: Pt at beside  Disposition Plan: Home when medically stable      Procedures/Studies: Dg Neck Soft Tissue  01/31/2013  *RADIOLOGY REPORT*  Clinical Data: The patient requires enteral feeding.  NECK SOFT TISSUES - 1+ VIEW  Comparison: None.  Findings: The patient was brought to radiology for fluoro guided feeding tube placement.  When the image intensifier was moved up over the region of the neck, the patient became extremely claustrophobic and was unable to continue with the procedure.  IMPRESSION: Unable to place a feeding tube secondary to the patient's claustrophobia.   Original Report Authenticated By: Kennith Center, M.D.    Dg Chest 2 View  02/03/2013  *RADIOLOGY REPORT*  Clinical Data: Right-sided chest pain  CHEST - 2 VIEW  Comparison: Portable chest x-ray of 02/01/2013  Findings: No active infiltrate or effusion is seen.  Right PICC line is present with the tip seen to the lower SVC.  No pneumothorax is noted.  The heart is within normal limits in size  IMPRESSION:   Right PICC line tip seen to lower SVC.  No pneumothorax.  No active lung disease.   Original Report Authenticated By: Dwyane Dee, M.D.    Dg Shoulder Right  02/03/2013  *RADIOLOGY REPORT*  Clinical Data: 57 year old female right shoulder pain and decreased range of motion.  RIGHT SHOULDER - 2+ VIEW  Comparison: None.  Findings: Right PICC line catheter identified, tip at the lower SVC level. No glenohumeral joint dislocation.  Proximal right humerus intact.  Right clavicle and scapula intact.  Visible right ribs and lung parenchyma within normal limits. Bone mineralization is within normal limits.  IMPRESSION: No  acute osseous abnormality identified about the  right shoulder.   Original Report Authenticated By: Erskine Speed, M.D.    Dg Abd 1 View  02/03/2013  *RADIOLOGY REPORT*  Clinical Data: 57 year old female tube placement.  ABDOMEN - 1 VIEW  Comparison: 02/01/2013 and earlier.  Findings: Feeding tube configuration is stable, the weighted tip appears to be in the descending portion of the duodenum as before. The stomach appears more decompressed, with more gastric redundancy of the tube. Nonobstructed bowel gas pattern.  Extensive postoperative changes of the lumbar spine again noted.  IMPRESSION: Stable feeding tube tip position in the descending portion of the duodenum.   Original Report Authenticated By: Erskine Speed, M.D.    Dg Cholangiogram Operative  01/11/2013  *RADIOLOGY REPORT*  Clinical Data:   Cholecystectomy.  INTRAOPERATIVE CHOLANGIOGRAM  Technique:  Cholangiographic images from the C-arm fluoroscopic device were submitted for interpretation post-operatively.  Please see the procedural report for the amount of contrast and the fluoroscopy time utilized.  Comparison:  Abdominal CT 01/06/2012  Findings:  The intrahepatic and extrahepatic bile ducts are dilated.  No contrast is identified within the duodenum.  There may be small filling defects within the common bile duct.  IMPRESSION: The biliary system is dilated and there is concern for a distal obstruction since no contrast is identified within the duodenum.  Small filling defects within the common bile duct may represent sludge or debris.   Original Report Authenticated By: Richarda Overlie, M.D.    Ct Angio Chest Pe W/cm &/or Wo Cm  01/07/2013  *RADIOLOGY REPORT*  Clinical Data: Left-sided flank pain.  Pain with inspiration.  CT ANGIOGRAPHY CHEST  Technique:  Multidetector CT imaging of the chest using the standard protocol during bolus administration of intravenous contrast. Multiplanar reconstructed images including MIPs were obtained and reviewed to evaluate the vascular anatomy.  Contrast: 80mL  OMNIPAQUE IOHEXOL 350 MG/ML SOLN  Comparison: Chest CT 09/03/2011.  Findings:  Mediastinum: Study is limited by large amount of patient respiratory motion.  With these limitations in mind, there is no evidence of central, lobar or proximal segmental sized pulmonary embolism.  Smaller distal segmental and subsegmental sized emboli cannot be completely excluded secondary to respiratory motion. Heart size is normal. There is no significant pericardial fluid, thickening or pericardial calcification.  No pathologically enlarged mediastinal or hilar lymph nodes. Esophagus is unremarkable in appearance.  Lungs/Pleura: Moderate left and trace right-sided pleural effusions.  Passive atelectasis in the dependent portion of the left lower lobe.  In addition, there is diffuse bronchial wall thickening with some thickening of the peribronchovascular interstitium and some patchy air space disease throughout the lungs bilaterally, predominately ground-glass in attenuation, suspicious for multifocal infection and/or inflammation.  This is relatively asymmetrically distributed.  Upper Abdomen: Perisplenic ascites.  Large amount of inflammatory changes in the retroperitoneum in the expected location of the tail of the pancreas, incompletely visualized.  Musculoskeletal: There are no aggressive appearing lytic or blastic lesions noted in the visualized portions of the skeleton.  IMPRESSION: 1.  Limited examination secondary to respiratory motion demonstrating no central, lobar or proximal segmental sized pulmonary embolism. 2.  The appearance of the lung parenchyma, as above, suggests multifocal infection and/or inflammation.  Specifically, the findings are most concerning for early changes of ARDS related to underlying pancreatitis. 3.  Moderate left and trace right-sided pleural effusions. 4.  Changes of pancreatitis incompletely visualized in the upper abdomen.   Original Report Authenticated By: Trudie Reed, M.D.    Ct  Abdomen Pelvis W Contrast  01/27/2013  *RADIOLOGY REPORT*  Clinical Data: Status post cholecystectomy with postop choledocholithiasis.  Gallstone pancreatitis with known pseudocysts.  CT ABDOMEN AND PELVIS WITH CONTRAST  Technique:  Multidetector CT imaging of the abdomen and pelvis was performed following the standard protocol during bolus administration of intravenous contrast.  Contrast:  100 ml Omnipaque-300 IV  Comparison: 01/15/2013  Findings: Linear scarring versus atelectasis at the left lung base. Trace left pleural effusion, improved.  Liver is within normal limits.  Spleen is notable for small volume perisplenic fluid, likely reflecting a pseudocyst, improved.  In addition, at least four distinct peripancreatic pseudocysts are present: --9.4 x 4.9 cm collection adjacent to the pancreatic tail (series 2/image 22), previously 10.9 x 5.6 cm --4.7 x 7.8 cm collection anterior to the pancreatic body (series 2/image 32), previously well defined, measuring 5.8 x 9.0 cm --2.9 x 2.5 cm collection inferior to the pancreatic head (series 2/image 46), previously 3.5 x 2.9 cm --4.7 x 4.0 cm collection in the right mid abdomen (series 2/image 5), previously 4.9 x 4.9 cm  While all of these collections have a thin rim, compatible with pseudocysts, none of them demonstrate a thickened rim to suggest abscess.  No evidence of pancreatic necrosis.  Status post cholecystectomy.  No intrahepatic ductal dilatation. Stable common bile duct, measuring up to 2.0 cm in diameter, but tapering at the ampulla.  Left adrenal gland is mildly nodular/thickened, unchanged.  Right adrenal gland is unremarkable.  Kidneys are within normal limits.  No hydronephrosis.  No evidence of bowel obstruction.  Large abdominal hernia containing multiple loops of small and large bowel, unchanged. Colonic diverticulosis, without associated inflammatory changes.  Atherosclerotic calcifications of the abdominal aorta and branch vessels.  No  abdominopelvic ascites.  No suspicious abdominopelvic lymphadenopathy.  Status post hysterectomy.  Bilateral ovaries are unremarkable.  Bladder is within normal limits.  Degenerative changes of the lumbar spine.  Status post PLIF from L2- 4.  Status post ALIF at L4-5 and L5-S1.  IMPRESSION: Multiple peripancreatic and perisplenic pseudocysts, as described above, mildly improved.  No evidence of abscess.  Status post cholecystectomy.  Stable common bile duct.  No intrahepatic ductal dilatation.  Large abdominal hernia containing multiple loops of small and large bowel.  No evidence of bowel obstruction.   Original Report Authenticated By: Charline Bills, M.D.    Ct Abdomen Pelvis W Contrast  01/15/2013  *RADIOLOGY REPORT*  Clinical Data: Acute pancreatitis.  CT ABDOMEN AND PELVIS WITH CONTRAST  Technique:  Multidetector CT imaging of the abdomen and pelvis was performed following the standard protocol during bolus administration of intravenous contrast.  Contrast: OMNIPAQUE IOHEXOL 300 MG/ML  SOLN  Comparison: 01/05/2013.  Findings: The lung bases demonstrate bilateral pleural effusions, left greater than right with overlying atelectasis.  These are new.  The liver demonstrates stable intrahepatic biliary dilatation.  No focal hepatic lesions.  The gallbladder is surgically absent. Stable common bile duct dilatation.  The spleen is mildly enlarged but but no focal lesions.  There is a large complex fluid collection surrounding the spleen with a thin enhancing membrane. This is most likely a pseudocyst.  There are also multiple complex pseudocyst surrounding the pancreas and in the splenic hilum and continuing down into the small bowel mesentery.  The pancreas demonstrates normal enhancement.  No findings to suggest pancreatic necrosis.  No hemorrhage or pseudoaneurysm.  The pancreatic duct is normal in caliber.  The adrenal glands and kidneys are unremarkable.  The stomach,  duodenum, small bowel and colon  grossly normal.  No findings for small bowel obstruction.  There is a large abdominal wall hernia containing small bowel and colon but no findings for obstruction/ incarceration.  There is extensive subcutaneous soft tissue swelling/edema which could suggest anasarca or cellulitis.  The bladder is unremarkable.  No pelvic mass or significant free pelvic fluid collection.  Surgical changes involving the lumbar spine are again demonstrated.  IMPRESSION:  1.  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 2.  No definite findings for abscess and no evidence of pancreatic necrosis. 3.  New bilateral pleural effusions, left greater than right with overlying atelectasis. 4.  Stable biliary dilatation. 5.  Large abdominal wall hernia. 6.  New subcutaneous edema could suggest anasarca or cellulitis.   Original Report Authenticated By: Rudie Meyer, M.D.    Ct Abdomen Pelvis W Contrast  01/05/2013  *RADIOLOGY REPORT*  Clinical Data: Abdominal pain.  Nausea and vomiting.  CT ABDOMEN AND PELVIS WITH CONTRAST  Technique:  Multidetector CT imaging of the abdomen and pelvis was performed following the standard protocol during bolus administration of intravenous contrast.  Contrast: OMNIPAQUE IOHEXOL 300 MG/ML  SOLN  Comparison: CT of the abdomen and pelvis 01/09/2009.  Findings:  Lung Bases: Unremarkable.  Abdomen/Pelvis:  Gallbladder appears moderately distended and there is some pericholecystic fluid and stranding.  Mild intrahepatic biliary ductal dilatation.  Common bile duct also appears dilated measuring up to 15 mm in diameter in the porta hepatis.  No definite radiopaque stone is identified within the common bile duct, or within the lumen of the gallbladder.  The appearance of the liver is otherwise unremarkable.  Peripancreatic stranding is noted diffusely, suggesting pancreatitis.  There is a small amount of fluid surrounding the spleen.  The  adrenal glands and kidneys are unremarkable in appearance bilaterally.  Atherosclerosis throughout the abdominal and pelvic vasculature, without definite aneurysm or dissection.  Numerous colonic diverticula are noted, without definite surrounding inflammatory changes to strongly suggest acute diverticulitis at this time.  The patient has a large ventral hernia inferiorly which contains a portion of the colon and multiple loops of small bowel.  No definite signs to suggest bowel incarceration or obstruction at this time.  Low-lying rectum well below the level of the pubococcygeal line, which could suggest rectal prolapse.  Status post hysterectomy.  Ovaries are atrophic. The urinary bladder is unremarkable in appearance.  Musculoskeletal: There are no aggressive appearing lytic or blastic lesions noted in the visualized portions of the skeleton. Anterior lumbar fixation at L4-L5 and L5-S1.  Status post laminectomy at L2, L3-L4 with PLIF from L2-L4.  Interbody grafts are present at L2-L3, L3-L4, L4-L5 and L5 - S1.  5 mm of retrolisthesis of L1 upon L2 is noted.  Alignment is otherwise anatomic.  IMPRESSION: 1.  Findings, as above, concerning for a the distal obstruction of the common bile duct, and possibly the pancreatic duct, with acute pancreatitis and possible acute cholecystitis. This could represent a stricture, recently passed a ductal stone, or a nonradiopaque stone in the distal common bile duct.  Clinical correlation is recommended. 2.  Extensive colonic diverticulosis without findings to suggest acute diverticulitis at this time.  3.  Large inferior ventral hernia containing portions of the colon and small bowel, without signs to suggest bowel obstruction at this time. 4.  Atherosclerosis. 5.  Extensive postoperative changes in the spine, as above, with 5 mm of retrolisthesis  of L1 upon L2. 6.  Findings suggestive of rectal prolapse.  Clinical correlation may be warranted.   Original Report Authenticated  By: Trudie Reed, M.D.    Dg Chest Port 1 View  02/01/2013  *RADIOLOGY REPORT*  Clinical Data: PICC line placement  PORTABLE CHEST - 1 VIEW  Comparison: 01/10/2013  Findings: Interval placement of a right PICC catheter with tip located over the lower SVC region.  No pneumothorax.  No pleural effusions.  Normal heart size and pulmonary vascularity.  No airspace disease or consolidation in the lungs.  IMPRESSION: PICC line appears to be in satisfactory position.  No pneumothorax. No active lung disease.   Original Report Authenticated By: Burman Nieves, M.D.    Dg Chest Port 1 View  01/10/2013  *RADIOLOGY REPORT*  Clinical Data: Follow-up  PORTABLE CHEST - 1 VIEW  Comparison: 01/09/2013  Findings: .  Lung volumes remain low.  To cardiac collapse / consolidation persists with left pleural effusion.  Vascular congestion again noted.  The radius trace amount of fluid in the minor fissure.  IMPRESSION: Slight improvement in overall aeration with persistent retrocardiac collapse / consolidation with left effusion.   Original Report Authenticated By: Kennith Center, M.D.    Dg Chest Port 1 View  01/09/2013  *RADIOLOGY REPORT*  Clinical Data: Follow up  PORTABLE CHEST - 1 VIEW  Comparison: None.  Findings: Cardiomegaly with mild to moderate interstitial edema, increased.  Worsening opacification of the left mid lung, underlying pneumonia not excluded.  Moderate layering left pleural effusion.  No pneumothorax.  IMPRESSION: Cardiomegaly with mild to moderate interstitial edema, increased.  Moderate layering left pleural effusion.  Underlying left upper lobe pneumonia not excluded.   Original Report Authenticated By: Charline Bills, M.D.    Dg Chest Port 1 View  01/08/2013  *RADIOLOGY REPORT*  Clinical Data: Hypoxia.  PORTABLE CHEST - 1 VIEW  Comparison: CT chest 01/07/2013 and chest radiograph 01/01/2011.  Findings: Trachea is midline.  Heart size is accentuated by AP semi upright technique and low lung  volumes.  There is central pulmonary vascular congestion with left perihilar and left lower lobe air space disease.  Mild diffuse interstitial prominence.  Small left pleural effusion.  IMPRESSION: Bilateral air space disease, left greater than right.  Left upper/left lower lobe pneumonia is queried. Probable underlying edema.   Original Report Authenticated By: Leanna Battles, M.D.    Dg Abd Portable 1v  01/30/2013  *RADIOLOGY REPORT*  Clinical Data: Nasogastric tube placement.  PORTABLE ABDOMEN - 1 VIEW  Comparison: CT exam 01/27/2013  Findings: Nasogastric tube is in place, tip overlying the level of the distal stomach.  Bowel gas pattern is nonobstructive.  There is residual contrast in the colonic loops and rectosigmoid colon. Multiple colonic diverticula are present. Bowel loops overlie the left hip, consistent with abdominal wall laxity.  The patient has had previous spinal surgery.  Surgical clips are present in the right upper quadrant of the abdomen.  IMPRESSION: Nasogastric tube tip overlying the level of the distal stomach.   Original Report Authenticated By: Norva Pavlov, M.D.    Dg Abd Portable 1v  01/08/2013  *RADIOLOGY REPORT*  Clinical Data: Pancreatitis, abdominal pain and vomiting.  PORTABLE ABDOMEN - 1 VIEW  Comparison: CT of the abdomen and pelvis from 01/05/2013.  Findings: There is no evidence of bowel obstruction or significant ileus.  No gross signs of free air.  Extensive lumbar fusion hardware noted.  No abnormal calcifications identified.  IMPRESSION: No acute findings.  Original Report Authenticated By: Irish Lack, M.D.    Dg Intro Long Gi Tube  02/01/2013  *RADIOLOGY REPORT*  Clinical Data: Pancreatitis.  Panda tube placement  INTRO LONG GI TUBE  Technique: Under fluoroscopic guidance a feeding tube was introduced into the second portion of  the duodenum.  Placement was confirmed by injection of oral contrast.  Comparison:  None.  Findings: Under fluoroscopic guidance a  feeding tube was introduced into the second portion of  the duodenum.  Placement was confirmed by injection of oral contrast. There is some redundant tube within the stomach. Stylet removed.  IMPRESSION: Placement of feeding tube within the second portion of the duodenum.   Original Report Authenticated By: Genevive Bi, M.D.          Subjective:   Objective: Filed Vitals:   02/03/13 0550 02/03/13 1500 02/03/13 2105 02/04/13 0534  BP: 116/82 112/62 129/70 99/49  Pulse: 88 86 95 84  Temp: 98 F (36.7 C) 98.7 F (37.1 C) 98.5 F (36.9 C) 98 F (36.7 C)  TempSrc: Oral Oral Oral Other (Comment)  Resp: 20 18 18 18   Height:      Weight:    121.3 kg (267 lb 6.7 oz)  SpO2: 97% 95% 97% 96%    Intake/Output Summary (Last 24 hours) at 02/04/13 0809 Last data filed at 02/04/13 0609  Gross per 24 hour  Intake   1090 ml  Output    800 ml  Net    290 ml   Weight change:  Exam:   General:  Pt is alert, follows commands appropriately, not in acute distress  HEENT: No icterus, No thrush, No neck mass, [place]/AT  Cardiovascular: RRR, S1/S2, no rubs, no gallops  Respiratory: CTA bilaterally, no wheezing, no crackles, no rhonchi  Abdomen: Soft/+BS, non tender, non distended, no guarding  Extremities: No edema, No lymphangitis, No petechiae, No rashes, no synovitis  Data Reviewed: Basic Metabolic Panel:  Recent Labs Lab 01/29/13 0500 01/30/13 0515 02/01/13 0850 02/03/13 0335  NA 138 137 137 135  K 3.4* 3.8 3.9 3.9  CL 99 101 102 98  CO2 28 29 28 29   GLUCOSE 113* 156* 120* 122*  BUN 9 10 13 14   CREATININE 0.57 0.58 0.57 0.51  CALCIUM 9.0 8.8 8.9 8.8  MG 2.0  --  1.9 1.9  PHOS 3.6  --  3.7 3.1   Liver Function Tests:  Recent Labs Lab 01/29/13 0500 02/01/13 0850  AST 24 13  ALT 52* 22  ALKPHOS 124* 97  BILITOT 0.4 0.3  PROT 6.1 6.5  ALBUMIN 2.9* 2.9*    Recent Labs Lab 01/29/13 0500  LIPASE 97*   No results found for this basename: AMMONIA,  in the last  168 hours CBC:  Recent Labs Lab 01/29/13 0500 02/01/13 0850  WBC 5.4 5.7  NEUTROABS 3.0  --   HGB 12.6 12.9  HCT 38.8 39.5  MCV 89.0 89.8  PLT 214 186   Cardiac Enzymes: No results found for this basename: CKTOTAL, CKMB, CKMBINDEX, TROPONINI,  in the last 168 hours BNP: No components found with this basename: POCBNP,  CBG:  Recent Labs Lab 02/03/13 0619 02/03/13 1209 02/03/13 1734 02/03/13 2353 02/04/13 0532  GLUCAP 120* 105* 122* 114* 130*    No results found for this or any previous visit (from the past 240 hour(s)).   Scheduled Meds: . alteplase  2 mg Intracatheter Once  . feeding supplement (VITAL 1.5 CAL)  1,000 mL Per Tube Q24H  .  insulin aspart  0-9 Units Subcutaneous Q6H  . lidocaine  1 patch Transdermal Q24H  . lipase/protease/amylase  2 capsule Oral TID AC  . LORazepam  0.5 mg Oral QHS  . methocarbamol  750 mg Oral TID  . OxyCODONE  15 mg Oral Q12H  . pantoprazole (PROTONIX) IV  40 mg Intravenous Q24H  . sodium chloride  10-40 mL Intracatheter Q12H  . sodium chloride  3 mL Intravenous Q12H   Continuous Infusions: . sodium chloride 35 mL/hr at 02/03/13 1411     Thao Bauza, DO  [facility] Pager [phone]  If 7PM-7AM, please contact night-coverage [email or web address] Password [email or web address] 02/04/2013, 8:09 AM   LOS: 9 days

## 2013-02-04 NOTE — Progress Notes (Addendum)
Some slight swelling noticed in right hand and arm. Paged on call MD to notify. Please ask daytime MD to assess site to determine if PICC line will stay in place. Pt is very anxious about this development, I described SVT vs DVT to assuage her anxiety, and to let her know that we are monitoring her closely.

## 2013-02-04 NOTE — Progress Notes (Signed)
Reviewed Panda tube placement in 2nd portion of duodenum. Should be adequate for feedings. Last LFT's nearly nml. Will s/o for now

## 2013-02-05 LAB — BASIC METABOLIC PANEL
BUN: 15 mg/dL (ref 6–23)
CO2: 29 mEq/L (ref 19–32)
Calcium: 9 mg/dL (ref 8.4–10.5)
Creatinine, Ser: 0.53 mg/dL (ref 0.50–1.10)
GFR calc non Af Amer: >90 mL/min (ref >90)
Glucose, Bld: 123 mg/dL — ABNORMAL HIGH (ref 70–99)
Sodium: 135 mEq/L (ref 135–145)

## 2013-02-05 LAB — CBC
HCT: 38.8 % (ref 36.0–46.0)
Hemoglobin: 12.5 g/dL (ref 12.0–15.0)
MCH: 28.9 pg (ref 26.0–34.0)
MCHC: 32.2 g/dL (ref 30.0–36.0)
MCV: 89.8 fL (ref 78.0–100.0)
RBC: 4.32 MIL/uL (ref 3.87–5.11)

## 2013-02-05 LAB — GLUCOSE, CAPILLARY
Glucose-Capillary: 112 mg/dL — ABNORMAL HIGH (ref 70–99)
Glucose-Capillary: 125 mg/dL — ABNORMAL HIGH (ref 70–99)

## 2013-02-05 MED ORDER — BISACODYL 10 MG RE SUPP
10.0000 mg | Freq: Once | RECTAL | Status: AC
  Administered 2013-02-05: 10 mg via RECTAL
  Filled 2013-02-05: qty 1

## 2013-02-05 MED ORDER — VITAL 1.5 CAL PO LIQD
1000.0000 mL | ORAL | Status: AC
  Administered 2013-02-06: 1000 mL
  Filled 2013-02-05 (×2): qty 1000

## 2013-02-05 MED ORDER — OXYCODONE HCL ER 20 MG PO T12A
20.0000 mg | EXTENDED_RELEASE_TABLET | Freq: Two times a day (BID) | ORAL | Status: DC
  Administered 2013-02-05 (×2): 20 mg via ORAL
  Filled 2013-02-05 (×2): qty 1

## 2013-02-05 MED ORDER — PRO-STAT SUGAR FREE PO LIQD
30.0000 mL | Freq: Every day | ORAL | Status: DC
  Administered 2013-02-05: 30 mL
  Filled 2013-02-05 (×2): qty 30

## 2013-02-05 MED ORDER — VITAMINS A & D EX OINT
TOPICAL_OINTMENT | CUTANEOUS | Status: AC
  Administered 2013-02-05: 06:00:00
  Filled 2013-02-05: qty 5

## 2013-02-05 NOTE — Progress Notes (Signed)
NUTRITION FOLLOW UP  Intervention:   Initiate Vital 1.5 @ 55 ml/hr via NJ tube and increase by 10 ml every 4 hrs hours to goal rate of 80 ml/hr for 17 hours/day. 30 ml Prostat 1 time daily.  At goal rate, tube feeding regimen will provide 2112 kcal, 107 grams of protein, and 1039 ml of water.  Recommend 200 ml water q 6 hrs if pt able to tolerate bolus volume.  Nutrition Dx:   Inadequate oral intake, ongoing  Goal:   Pt to meet >/= 90% of their estimated nutrition needs  Monitor:   Enteral nutrition, GI function, wt2  Assessment:   Pt admitted with abdominal pain. 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 Pt met criteria for for severe malnutrition of acute illness on admission. She has been on TPN since (2/2) for nutrition support. TPN was discontinued when TF met goal rate on 2/7. Pt is currently receiving Vital 1.5 at 55 ml/hr. The plan is to switch pt to cyclic feeds running 17 hrs/day at 80 ml/hr to prepare pt for discharge.   Height: Ht Readings from Last 1 Encounters:  01/26/13 5\' 5"  (1.651 m)    Weight Status:   Wt Readings from Last 1 Encounters:  02/05/13 268 lb 8.3 oz (121.8 kg)    Re-estimated needs:  Kcal: 2000-2150  Protein: 110-120g  Fluid: 2.0 L/day   Skin: upper and lower extremity edema  Diet Order: NPO   Intake/Output Summary (Last 24 hours) at 02/05/13 1515 Last data filed at 02/05/13 0653  Gross per 24 hour  Intake   1920 ml  Output    475 ml  Net   1445 ml    Last BM: 2/7   Labs:   Recent Labs Lab 02/01/13 0850 02/03/13 0335 02/05/13 0450  NA 137 135 135  K 3.9 3.9 4.1  CL 102 98 98  CO2 28 29 29   BUN 13 14 15   CREATININE 0.57 0.51 0.53  CALCIUM 8.9 8.8 9.0  MG 1.9 1.9 2.0  PHOS 3.7 3.1 4.0  GLUCOSE 120* 122* 123*    CBG (last 3)   Recent Labs  02/05/13 0023 02/05/13 0614 02/05/13 1214  GLUCAP 115* 112* 125*    Scheduled Meds: . alteplase  2 mg  Intracatheter Once  . bisacodyl  10 mg Rectal Once  . cyclobenzaprine  10 mg Oral TID  . feeding supplement (VITAL 1.5 CAL)  1,000 mL Per Tube Q24H  . insulin aspart  0-9 Units Subcutaneous Q6H  . lidocaine  1 patch Transdermal Q24H  . lipase/protease/amylase  2 capsule Oral TID AC  . LORazepam  0.5 mg Oral QHS  . OxyCODONE  20 mg Oral Q12H  . pantoprazole (PROTONIX) IV  40 mg Intravenous Q24H  . sodium chloride  10-40 mL Intracatheter Q12H  . sodium chloride  3 mL Intravenous Q12H    Continuous Infusions: . sodium chloride 35 mL/hr at 02/04/13 2124    Ian Malkin RD, LDN Inpatient Clinical Dietitian Pager: [phone] After Hours Pager: [phone]

## 2013-02-05 NOTE — Progress Notes (Signed)
Subjective: Patient seen , still c/o abdominal and back pain. Says the pain medications are not helping. Patient was started on MS contin and prn morphine yesterday. She rates the pain as 7/10 in intesity in the abdomen and 8/10 in back and right sided chest pain. Xray of shoulder was negative for any pathology.  Basilic vein superficial thrombus noted on the venous duplex..Pain is better controlled with flexeril for muscle spasms. Required 5 mg of IV dilaudid for breakthrough pain  Filed Vitals:   02/05/13 0627  BP: 96/56  Pulse: 83  Temp: 98.4 F (36.9 C)  Resp:     Chest: Clear Bilaterally Heart : S1S2 RRR Abdomen: Soft, nontender Ext : No edema Neuro: Alert, oriented x 3 Ext: No pain in shoulder elicited. Right upper extremity is swollen On movement, patient has  Upper right sided chest pain and is tender to palpation  A/P  Superficial thrombus in basilic vein Picc line  Removed Cold compress for swelling  Chronic pain Will increase OxyContin to 20 mg bid Dilaudid 1 mg IV q 4 hr prn  Back muscle spasm Changed the robaxin to flexeril 10 mg po tid  Will follow for symptom management  Meredeth Ide [facility]/Palliative Medicine Team [phone]

## 2013-02-05 NOTE — Progress Notes (Signed)
Rehab Admissions Coordinator Note:  Patient was screened by Brock Ra for appropriateness for an Inpatient Acute Rehab Consult. Pt's insurance would not cover CIR for her diagnosis; it would cover SNF.  At this time, we are recommending Skilled Nursing Facility.  Melanee Spry S 02/05/2013, 11:06 AM  I can be reached at [phone].

## 2013-02-05 NOTE — Progress Notes (Signed)
Talked to patient about discharge planning, patient lives alone, spouse died about 1 yr ago and patient is very emotional. Home health care choices given, patient chose [facility] for [facility] services. Patient does not want to go to a facility short term for rehab and plans to return home. Patient stated that she will have someone stay with her for a few days; [name] RN with [facility] called for arrangements; Attending MD please order HHRN/ PT at discharge; B Shelba Flake

## 2013-02-05 NOTE — Progress Notes (Signed)
[facility] PROGRESS NOTE  KAYLAMARIE SWICKARD WUJ:[id number] DOB: 08/23/1956 DOA: 01/26/2013 PCP: Kaleen Mask, MD  Assessment/Plan: Subacute pancreatitis/pancreatic pseudocyst/abdominal pain  -In the setting of recent gallstone pancreatitis  -Status post cholecystectomy 01/11/2013 secondary to gallstone pancreatitis  -Intraoperative cholangiogram suggested choledocholithiasis  -MRCP cannot be performed due to the patient's body habitus  -CT abdomen and pelvis January 16th and 01/27/2013 revealed pancreatic pseudocysts  -Patient readmitted 01/27/2013 due to increasing abdominal pain  -Endoscopic ultrasound 01/29/13 showing multiple pancreatic pseudocyst without any CBD obstruction--scattered peripancreatic edema and cystic collections. No obvious mass noted. No evidence of bile stones or thickening noted with normal ampulla  -Appreciate GI followup  -dobbhoff tube placed today(02/01/13), tolerating TF  -If patient is able to take pills with sips of water, long-acting opioids may be able to be used (MS Contin vs oxycontin)  -Appreciate Dr. Lubertha Basque assistance  -continue prn ativan  -Continue when necessary antiemetics  -Increase activity--PT eval and treat  Pain management  -per Palliative medicine  -changed to oxyContin today (02/13/13) and IV hydromorphone  -OxyContin increased to 20 mg twice a day 02/05/13 -Hopeful to discharge to skilled nursing facility 02/06/2013 if pain is controlled Chest pain-right infraclavicular  -Suspect musculoskeletal  -Pain is 100% reproducible  -EKG is reassuring  -Wells score--1.5  -Chest x-ray and right shoulder x-rays are negative  Right arm pain/superficial thrombus--basilic vein  -Unclear etiology  -venous duplex shows superficial thrombus at basilic v.  -Neurovascularly intact  -Referred pain from right shoulder??--Had right rotator cuff repair 10/11/2012  -Place peripheral IV  -Discontinue PICC line 02/04/13 Hypokalemia  -Repleted   -Check magnesium--1.9  Back spasms  -Robaxin changed to flexeril with improvement  -Ativan 0.5 mg each bedtime  -Continue Ativan 0.5 mg every 6 hours when necessary  Nutrition  -Patient is currently tolerating tube feeds without significant pain worsening  -Recheck electrolytes in the morning  -D/C TPN (02/02/13), patient is at goal rate for tube feeds   Disposition Plan: plan SNF 02/06/13 if cleared by palliative      Family Communication:   Pt at beside Disposition Plan:   Home when medically stable      Procedures/Studies: Dg Neck Soft Tissue  01/31/2013  *RADIOLOGY REPORT*  Clinical Data: The patient requires enteral feeding.  NECK SOFT TISSUES - 1+ VIEW  Comparison: None.  Findings: The patient was brought to radiology for fluoro guided feeding tube placement.  When the image intensifier was moved up over the region of the neck, the patient became extremely claustrophobic and was unable to continue with the procedure.  IMPRESSION: Unable to place a feeding tube secondary to the patient's claustrophobia.   Original Report Authenticated By: Kennith Center, M.D.    Dg Chest 2 View  02/03/2013  *RADIOLOGY REPORT*  Clinical Data: Right-sided chest pain  CHEST - 2 VIEW  Comparison: Portable chest x-ray of 02/01/2013  Findings: No active infiltrate or effusion is seen.  Right PICC line is present with the tip seen to the lower SVC.  No pneumothorax is noted.  The heart is within normal limits in size  IMPRESSION:   Right PICC line tip seen to lower SVC.  No pneumothorax.  No active lung disease.   Original Report Authenticated By: Dwyane Dee, M.D.    Dg Shoulder Right  02/03/2013  *RADIOLOGY REPORT*  Clinical Data: 57 year old female right shoulder pain and decreased range of motion.  RIGHT SHOULDER - 2+ VIEW  Comparison: None.  Findings: Right PICC line catheter identified, tip at the  lower SVC level. No glenohumeral joint dislocation.  Proximal right humerus intact.  Right clavicle and  scapula intact.  Visible right ribs and lung parenchyma within normal limits. Bone mineralization is within normal limits.  IMPRESSION: No acute osseous abnormality identified about the right shoulder.   Original Report Authenticated By: Erskine Speed, M.D.    Dg Abd 1 View  02/03/2013  *RADIOLOGY REPORT*  Clinical Data: 57 year old female tube placement.  ABDOMEN - 1 VIEW  Comparison: 02/01/2013 and earlier.  Findings: Feeding tube configuration is stable, the weighted tip appears to be in the descending portion of the duodenum as before. The stomach appears more decompressed, with more gastric redundancy of the tube. Nonobstructed bowel gas pattern.  Extensive postoperative changes of the lumbar spine again noted.  IMPRESSION: Stable feeding tube tip position in the descending portion of the duodenum.   Original Report Authenticated By: Erskine Speed, M.D.    Dg Cholangiogram Operative  01/11/2013  *RADIOLOGY REPORT*  Clinical Data:   Cholecystectomy.  INTRAOPERATIVE CHOLANGIOGRAM  Technique:  Cholangiographic images from the C-arm fluoroscopic device were submitted for interpretation post-operatively.  Please see the procedural report for the amount of contrast and the fluoroscopy time utilized.  Comparison:  Abdominal CT 01/06/2012  Findings:  The intrahepatic and extrahepatic bile ducts are dilated.  No contrast is identified within the duodenum.  There may be small filling defects within the common bile duct.  IMPRESSION: The biliary system is dilated and there is concern for a distal obstruction since no contrast is identified within the duodenum.  Small filling defects within the common bile duct may represent sludge or debris.   Original Report Authenticated By: Richarda Overlie, M.D.    Ct Angio Chest Pe W/cm &/or Wo Cm  01/07/2013  *RADIOLOGY REPORT*  Clinical Data: Left-sided flank pain.  Pain with inspiration.  CT ANGIOGRAPHY CHEST  Technique:  Multidetector CT imaging of the chest using the standard  protocol during bolus administration of intravenous contrast. Multiplanar reconstructed images including MIPs were obtained and reviewed to evaluate the vascular anatomy.  Contrast: 80mL OMNIPAQUE IOHEXOL 350 MG/ML SOLN  Comparison: Chest CT 09/03/2011.  Findings:  Mediastinum: Study is limited by large amount of patient respiratory motion.  With these limitations in mind, there is no evidence of central, lobar or proximal segmental sized pulmonary embolism.  Smaller distal segmental and subsegmental sized emboli cannot be completely excluded secondary to respiratory motion. Heart size is normal. There is no significant pericardial fluid, thickening or pericardial calcification.  No pathologically enlarged mediastinal or hilar lymph nodes. Esophagus is unremarkable in appearance.  Lungs/Pleura: Moderate left and trace right-sided pleural effusions.  Passive atelectasis in the dependent portion of the left lower lobe.  In addition, there is diffuse bronchial wall thickening with some thickening of the peribronchovascular interstitium and some patchy air space disease throughout the lungs bilaterally, predominately ground-glass in attenuation, suspicious for multifocal infection and/or inflammation.  This is relatively asymmetrically distributed.  Upper Abdomen: Perisplenic ascites.  Large amount of inflammatory changes in the retroperitoneum in the expected location of the tail of the pancreas, incompletely visualized.  Musculoskeletal: There are no aggressive appearing lytic or blastic lesions noted in the visualized portions of the skeleton.  IMPRESSION: 1.  Limited examination secondary to respiratory motion demonstrating no central, lobar or proximal segmental sized pulmonary embolism. 2.  The appearance of the lung parenchyma, as above, suggests multifocal infection and/or inflammation.  Specifically, the findings are most concerning for early changes of ARDS  related to underlying pancreatitis. 3.  Moderate left  and trace right-sided pleural effusions. 4.  Changes of pancreatitis incompletely visualized in the upper abdomen.   Original Report Authenticated By: Trudie Reed, M.D.    Ct Abdomen Pelvis W Contrast  01/27/2013  *RADIOLOGY REPORT*  Clinical Data: Status post cholecystectomy with postop choledocholithiasis.  Gallstone pancreatitis with known pseudocysts.  CT ABDOMEN AND PELVIS WITH CONTRAST  Technique:  Multidetector CT imaging of the abdomen and pelvis was performed following the standard protocol during bolus administration of intravenous contrast.  Contrast:  100 ml Omnipaque-300 IV  Comparison: 01/15/2013  Findings: Linear scarring versus atelectasis at the left lung base. Trace left pleural effusion, improved.  Liver is within normal limits.  Spleen is notable for small volume perisplenic fluid, likely reflecting a pseudocyst, improved.  In addition, at least four distinct peripancreatic pseudocysts are present: --9.4 x 4.9 cm collection adjacent to the pancreatic tail (series 2/image 22), previously 10.9 x 5.6 cm --4.7 x 7.8 cm collection anterior to the pancreatic body (series 2/image 32), previously well defined, measuring 5.8 x 9.0 cm --2.9 x 2.5 cm collection inferior to the pancreatic head (series 2/image 46), previously 3.5 x 2.9 cm --4.7 x 4.0 cm collection in the right mid abdomen (series 2/image 5), previously 4.9 x 4.9 cm  While all of these collections have a thin rim, compatible with pseudocysts, none of them demonstrate a thickened rim to suggest abscess.  No evidence of pancreatic necrosis.  Status post cholecystectomy.  No intrahepatic ductal dilatation. Stable common bile duct, measuring up to 2.0 cm in diameter, but tapering at the ampulla.  Left adrenal gland is mildly nodular/thickened, unchanged.  Right adrenal gland is unremarkable.  Kidneys are within normal limits.  No hydronephrosis.  No evidence of bowel obstruction.  Large abdominal hernia containing multiple loops of small and  large bowel, unchanged. Colonic diverticulosis, without associated inflammatory changes.  Atherosclerotic calcifications of the abdominal aorta and branch vessels.  No abdominopelvic ascites.  No suspicious abdominopelvic lymphadenopathy.  Status post hysterectomy.  Bilateral ovaries are unremarkable.  Bladder is within normal limits.  Degenerative changes of the lumbar spine.  Status post PLIF from L2- 4.  Status post ALIF at L4-5 and L5-S1.  IMPRESSION: Multiple peripancreatic and perisplenic pseudocysts, as described above, mildly improved.  No evidence of abscess.  Status post cholecystectomy.  Stable common bile duct.  No intrahepatic ductal dilatation.  Large abdominal hernia containing multiple loops of small and large bowel.  No evidence of bowel obstruction.   Original Report Authenticated By: Charline Bills, M.D.    Ct Abdomen Pelvis W Contrast  01/15/2013  *RADIOLOGY REPORT*  Clinical Data: Acute pancreatitis.  CT ABDOMEN AND PELVIS WITH CONTRAST  Technique:  Multidetector CT imaging of the abdomen and pelvis was performed following the standard protocol during bolus administration of intravenous contrast.  Contrast: OMNIPAQUE IOHEXOL 300 MG/ML  SOLN  Comparison: 01/05/2013.  Findings: The lung bases demonstrate bilateral pleural effusions, left greater than right with overlying atelectasis.  These are new.  The liver demonstrates stable intrahepatic biliary dilatation.  No focal hepatic lesions.  The gallbladder is surgically absent. Stable common bile duct dilatation.  The spleen is mildly enlarged but but no focal lesions.  There is a large complex fluid collection surrounding the spleen with a thin enhancing membrane. This is most likely a pseudocyst.  There are also multiple complex pseudocyst surrounding the pancreas and in the splenic hilum and continuing down into the small bowel  mesentery.  The pancreas demonstrates normal enhancement.  No findings to suggest pancreatic necrosis.  No  hemorrhage or pseudoaneurysm.  The pancreatic duct is normal in caliber.  The adrenal glands and kidneys are unremarkable.  The stomach, duodenum, small bowel and colon grossly normal.  No findings for small bowel obstruction.  There is a large abdominal wall hernia containing small bowel and colon but no findings for obstruction/ incarceration.  There is extensive subcutaneous soft tissue swelling/edema which could suggest anasarca or cellulitis.  The bladder is unremarkable.  No pelvic mass or significant free pelvic fluid collection.  Surgical changes involving the lumbar spine are again demonstrated.  IMPRESSION:  1.  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 2.  No definite findings for abscess and no evidence of pancreatic necrosis. 3.  New bilateral pleural effusions, left greater than right with overlying atelectasis. 4.  Stable biliary dilatation. 5.  Large abdominal wall hernia. 6.  New subcutaneous edema could suggest anasarca or cellulitis.   Original Report Authenticated By: Rudie Meyer, M.D.    Dg Chest Port 1 View  02/01/2013  *RADIOLOGY REPORT*  Clinical Data: PICC line placement  PORTABLE CHEST - 1 VIEW  Comparison: 01/10/2013  Findings: Interval placement of a right PICC catheter with tip located over the lower SVC region.  No pneumothorax.  No pleural effusions.  Normal heart size and pulmonary vascularity.  No airspace disease or consolidation in the lungs.  IMPRESSION: PICC line appears to be in satisfactory position.  No pneumothorax. No active lung disease.   Original Report Authenticated By: Burman Nieves, M.D.    Dg Chest Port 1 View  01/10/2013  *RADIOLOGY REPORT*  Clinical Data: Follow-up  PORTABLE CHEST - 1 VIEW  Comparison: 01/09/2013  Findings: .  Lung volumes remain low.  To cardiac collapse / consolidation persists with left pleural effusion.  Vascular congestion again noted.  The radius trace amount of  fluid in the minor fissure.  IMPRESSION: Slight improvement in overall aeration with persistent retrocardiac collapse / consolidation with left effusion.   Original Report Authenticated By: Kennith Center, M.D.    Dg Chest Port 1 View  01/09/2013  *RADIOLOGY REPORT*  Clinical Data: Follow up  PORTABLE CHEST - 1 VIEW  Comparison: None.  Findings: Cardiomegaly with mild to moderate interstitial edema, increased.  Worsening opacification of the left mid lung, underlying pneumonia not excluded.  Moderate layering left pleural effusion.  No pneumothorax.  IMPRESSION: Cardiomegaly with mild to moderate interstitial edema, increased.  Moderate layering left pleural effusion.  Underlying left upper lobe pneumonia not excluded.   Original Report Authenticated By: Charline Bills, M.D.    Dg Chest Port 1 View  01/08/2013  *RADIOLOGY REPORT*  Clinical Data: Hypoxia.  PORTABLE CHEST - 1 VIEW  Comparison: CT chest 01/07/2013 and chest radiograph 01/01/2011.  Findings: Trachea is midline.  Heart size is accentuated by AP semi upright technique and low lung volumes.  There is central pulmonary vascular congestion with left perihilar and left lower lobe air space disease.  Mild diffuse interstitial prominence.  Small left pleural effusion.  IMPRESSION: Bilateral air space disease, left greater than right.  Left upper/left lower lobe pneumonia is queried. Probable underlying edema.   Original Report Authenticated By: Leanna Battles, M.D.    Dg Abd Portable 1v  01/30/2013  *RADIOLOGY REPORT*  Clinical Data: Nasogastric tube placement.  PORTABLE ABDOMEN - 1 VIEW  Comparison: CT exam 01/27/2013  Findings:  Nasogastric tube is in place, tip overlying the level of the distal stomach.  Bowel gas pattern is nonobstructive.  There is residual contrast in the colonic loops and rectosigmoid colon. Multiple colonic diverticula are present. Bowel loops overlie the left hip, consistent with abdominal wall laxity.  The patient has had  previous spinal surgery.  Surgical clips are present in the right upper quadrant of the abdomen.  IMPRESSION: Nasogastric tube tip overlying the level of the distal stomach.   Original Report Authenticated By: Norva Pavlov, M.D.    Dg Abd Portable 1v  01/08/2013  *RADIOLOGY REPORT*  Clinical Data: Pancreatitis, abdominal pain and vomiting.  PORTABLE ABDOMEN - 1 VIEW  Comparison: CT of the abdomen and pelvis from 01/05/2013.  Findings: There is no evidence of bowel obstruction or significant ileus.  No gross signs of free air.  Extensive lumbar fusion hardware noted.  No abnormal calcifications identified.  IMPRESSION: No acute findings.   Original Report Authenticated By: Irish Lack, M.D.    Dg Intro Long Gi Tube  02/01/2013  *RADIOLOGY REPORT*  Clinical Data: Pancreatitis.  Panda tube placement  INTRO LONG GI TUBE  Technique: Under fluoroscopic guidance a feeding tube was introduced into the second portion of  the duodenum.  Placement was confirmed by injection of oral contrast.  Comparison:  None.  Findings: Under fluoroscopic guidance a feeding tube was introduced into the second portion of  the duodenum.  Placement was confirmed by injection of oral contrast. There is some redundant tube within the stomach. Stylet removed.  IMPRESSION: Placement of feeding tube within the second portion of the duodenum.   Original Report Authenticated By: Genevive Bi, M.D.          Subjective: Patient's pain is better overall. Denies any fevers, chills, shortness of breath, vomiting, diarrhea. Abdominal pain has improved overall. Denies dysuria, hematuria, dizziness comes in today. No new rashes. Chest pain is about the same as the last few days on the right infraclavicular area.  Objective: Filed Vitals:   02/04/13 1500 02/04/13 2100 02/05/13 0622 02/05/13 0627  BP: 97/54 111/60 106/47 96/56  Pulse: 88 87 81 83  Temp: 99.1 F (37.3 C) 98.5 F (36.9 C)  98.4 F (36.9 C)  TempSrc: Oral Oral   Oral  Resp: 18 20    Height:      Weight:    121.8 kg (268 lb 8.3 oz)  SpO2: 94% 95% 95%     Intake/Output Summary (Last 24 hours) at 02/05/13 1238 Last data filed at 02/05/13 2956  Gross per 24 hour  Intake   1920 ml  Output    475 ml  Net   1445 ml   Weight change: 0.5 kg (1 lb 1.6 oz) Exam:   General:  Pt is alert, follows commands appropriately, not in acute distress  HEENT: No icterus, No thrush, No neck mass, Daisy/AT  Cardiovascular: RRR, S1/S2, no rubs, no gallops  Respiratory: CTA bilaterally, no wheezing, no crackles, no rhonchi  Abdomen: Soft/+BS, non tender, non distended, no guarding  Extremities: trace edema legs and right upper extremity, No lymphangitis, No petechiae, No rashes, no synovitis; chest wall without any erythema, edema, open draining wounds. No lymphangitis, crepitance, necrosis of the right upper extremity biceps and triceps areas are soft..  Data Reviewed: Basic Metabolic Panel:  Recent Labs Lab 01/30/13 0515 02/01/13 0850 02/03/13 0335 02/05/13 0450  NA 137 137 135 135  K 3.8 3.9 3.9 4.1  CL 101 102 98 98  CO2  29 28 29 29   GLUCOSE 156* 120* 122* 123*  BUN 10 13 14 15   CREATININE 0.58 0.57 0.51 0.53  CALCIUM 8.8 8.9 8.8 9.0  MG  --  1.9 1.9 2.0  PHOS  --  3.7 3.1 4.0   Liver Function Tests:  Recent Labs Lab 02/01/13 0850  AST 13  ALT 22  ALKPHOS 97  BILITOT 0.3  PROT 6.5  ALBUMIN 2.9*   No results found for this basename: LIPASE, AMYLASE,  in the last 168 hours No results found for this basename: AMMONIA,  in the last 168 hours CBC:  Recent Labs Lab 02/01/13 0850 02/05/13 0450  WBC 5.7 6.9  HGB 12.9 12.5  HCT 39.5 38.8  MCV 89.8 89.8  PLT 186 151   Cardiac Enzymes: No results found for this basename: CKTOTAL, CKMB, CKMBINDEX, TROPONINI,  in the last 168 hours BNP: No components found with this basename: POCBNP,  CBG:  Recent Labs Lab 02/04/13 1158 02/04/13 1739 02/05/13 0023 02/05/13 0614 02/05/13 1214   GLUCAP 108* 114* 115* 112* 125*    No results found for this or any previous visit (from the past 240 hour(s)).   Scheduled Meds: . alteplase  2 mg Intracatheter Once  . cyclobenzaprine  10 mg Oral TID  . feeding supplement (VITAL 1.5 CAL)  1,000 mL Per Tube Q24H  . insulin aspart  0-9 Units Subcutaneous Q6H  . lidocaine  1 patch Transdermal Q24H  . lipase/protease/amylase  2 capsule Oral TID AC  . LORazepam  0.5 mg Oral QHS  . OxyCODONE  20 mg Oral Q12H  . pantoprazole (PROTONIX) IV  40 mg Intravenous Q24H  . sodium chloride  10-40 mL Intracatheter Q12H  . sodium chloride  3 mL Intravenous Q12H   Continuous Infusions: . sodium chloride 35 mL/hr at 02/04/13 2124     Kanylah Muench, DO  [facility] Pager [phone]  If 7PM-7AM, please contact night-coverage [email or web address] Password [facility] 02/05/2013, 12:38 PM   LOS: 10 days

## 2013-02-05 NOTE — Progress Notes (Signed)
Physical Therapy Treatment Patient Details Name: [name] [name] MRN: [id number] DOB: [birth year] Today's Date: 02/05/2013 Time: 4098-1191 PT Time Calculation (min): 24 min  PT Assessment / Plan / Recommendation Comments on Treatment Session  Pt progressing well with mobility and is now at min/guard level for safety.  States that she could have some assist at home if needed, therefore feel HHPT will be appropriate if can not go to SNF.     Follow Up Recommendations  SNF;Home health PT     Does the patient have the potential to tolerate intense rehabilitation     Barriers to Discharge        Equipment Recommendations  None recommended by PT    Recommendations for Other Services    Frequency Min 3X/week   Plan Discharge plan remains appropriate    Precautions / Restrictions Precautions Precautions: Fall Precaution Comments: Keep HOB elevated 2* NG tube Restrictions Weight Bearing Restrictions: No   Pertinent Vitals/Pain 10/10 when back spasms.  Was premedicated.     Mobility  Bed Mobility Bed Mobility: Rolling Left;Left Sidelying to Sit;Sit to Sidelying Left Rolling Left: 5: Supervision Left Sidelying to Sit: 4: Min assist Sit to Sidelying Left: 4: Min assist Details for Bed Mobility Assistance: Pt doing well with log rolling technique to get into sitting and back into bed.  Some assist for LEs into bed and for trunk to attain sitting position.  Cues for technique.  Transfers Transfers: Sit to Stand;Stand to Sit Sit to Stand: 4: Min guard;From bed;From toilet Stand to Sit: 4: Min guard;To bed;To toilet Details for Transfer Assistance: Min/guard for safety with cues for hand placement and safety.  Ambulation/Gait Ambulation/Gait Assistance: 4: Min guard Ambulation Distance (Feet): 500 Feet Assistive device: Rolling walker Ambulation/Gait Assistance Details: Min cues for using RW and keeping it with her in restroom.  Pt moving very well with RW.  Gait Pattern: Within  Functional Limits    Exercises     PT Diagnosis:    PT Problem List:   PT Treatment Interventions:     PT Goals Acute Rehab PT Goals PT Goal Formulation: With patient Time For Goal Achievement: 02/18/13 Potential to Achieve Goals: Fair Pt will go Supine/Side to Sit: with modified independence PT Goal: Supine/Side to Sit - Progress: Progressing toward goal Pt will go Sit to Stand: with modified independence PT Goal: Sit to Stand - Progress: Progressing toward goal Pt will Transfer Bed to Chair/Chair to Bed: with supervision PT Transfer Goal: Bed to Chair/Chair to Bed - Progress: Progressing toward goal Pt will Ambulate: with supervision;with rolling walker;51 - 150 feet PT Goal: Ambulate - Progress: Progressing toward goal  Visit Information  Last PT Received On: 02/05/13 Assistance Needed: +1    Subjective Data  Subjective: I do okay until I get these spasms in my  back. Patient Stated Goal: to walk more   Cognition  Cognition Overall Cognitive Status: Appears within functional limits for tasks assessed/performed Arousal/Alertness: Awake/alert Orientation Level: Appears intact for tasks assessed Behavior During Session: [facility] for tasks performed    Balance     End of Session PT - End of Session Activity Tolerance: Patient limited by pain Patient left: in bed;with call bell/phone within reach Nurse Communication: Mobility status   GP     Vista Deck 02/05/2013, 4:05 PM

## 2013-02-05 NOTE — Progress Notes (Signed)
7 Days Post-Op  Subjective: No complaints  Objective: Vital signs in last 24 hours: Temp:  [98.4 F (36.9 C)-99.1 F (37.3 C)] 98.4 F (36.9 C) (02/10 0627) Pulse Rate:  [81-88] 83 (02/10 0627) Resp:  [18-20] 20 (02/09 2100) BP: (96-111)/(47-60) 96/56 mmHg (02/10 0627) SpO2:  [94 %-95 %] 95 % (02/10 0622) Weight:  [268 lb 8.3 oz (121.8 kg)] 268 lb 8.3 oz (121.8 kg) (02/10 0627) Last BM Date: 02/02/13  Intake/Output from previous day: 02/09 0701 - 02/10 0700 In: 1920 [I.V.:1260; NG/GT:660] Out: 475 [Urine:475] Intake/Output this shift:    Incision/Wound:full abdomen soft non tender  Lab Results:   Recent Labs  02/05/13 0450  WBC 6.9  HGB 12.5  HCT 38.8  PLT 151   BMET  Recent Labs  02/03/13 0335 02/05/13 0450  NA 135 135  K 3.9 4.1  CL 98 98  CO2 29 29  GLUCOSE 122* 123*  BUN 14 15  CREATININE 0.51 0.53  CALCIUM 8.8 9.0   PT/INR No results found for this basename: LABPROT, INR,  in the last 72 hours ABG No results found for this basename: PHART, PCO2, PO2, HCO3,  in the last 72 hours  Studies/Results: Dg Chest 2 View  02/03/2013  *RADIOLOGY REPORT*  Clinical Data: Right-sided chest pain  CHEST - 2 VIEW  Comparison: Portable chest x-ray of 02/01/2013  Findings: No active infiltrate or effusion is seen.  Right PICC line is present with the tip seen to the lower SVC.  No pneumothorax is noted.  The heart is within normal limits in size  IMPRESSION:   Right PICC line tip seen to lower SVC.  No pneumothorax.  No active lung disease.   Original Report Authenticated By: Dwyane Dee, M.D.    Dg Shoulder Right  02/03/2013  *RADIOLOGY REPORT*  Clinical Data: 57 year old female right shoulder pain and decreased range of motion.  RIGHT SHOULDER - 2+ VIEW  Comparison: None.  Findings: Right PICC line catheter identified, tip at the lower SVC level. No glenohumeral joint dislocation.  Proximal right humerus intact.  Right clavicle and scapula intact.  Visible right ribs and  lung parenchyma within normal limits. Bone mineralization is within normal limits.  IMPRESSION: No acute osseous abnormality identified about the right shoulder.   Original Report Authenticated By: Erskine Speed, M.D.    Dg Abd 1 View  02/03/2013  *RADIOLOGY REPORT*  Clinical Data: 57 year old female tube placement.  ABDOMEN - 1 VIEW  Comparison: 02/01/2013 and earlier.  Findings: Feeding tube configuration is stable, the weighted tip appears to be in the descending portion of the duodenum as before. The stomach appears more decompressed, with more gastric redundancy of the tube. Nonobstructed bowel gas pattern.  Extensive postoperative changes of the lumbar spine again noted.  IMPRESSION: Stable feeding tube tip position in the descending portion of the duodenum.   Original Report Authenticated By: Erskine Speed, M.D.     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 Supportive care for now.  These fluid collections are not mature and require no intervention unless infected.  Agree with TF distal duodenum.  Likely hood these will resolve over next 6 - 9 months is 65-75 %.  Nothing else to offer.  Will need low fat diet.  Can try to feed once abdominal pain better   LOS: 10 days    [name] [name] A. 02/05/2013

## 2013-02-06 ENCOUNTER — Encounter (INDEPENDENT_AMBULATORY_CARE_PROVIDER_SITE_OTHER): Payer: Medicare Other | Admitting: General Surgery

## 2013-02-06 LAB — BASIC METABOLIC PANEL
CO2: 29 mEq/L (ref 19–32)
Calcium: 8.8 mg/dL (ref 8.4–10.5)
Creatinine, Ser: 0.56 mg/dL (ref 0.50–1.10)

## 2013-02-06 LAB — CBC WITH DIFFERENTIAL/PLATELET
Basophils Relative: 1 % (ref 0–1)
Lymphocytes Relative: 23 % (ref 12–46)
MCV: 90.3 fL (ref 78.0–100.0)
Platelets: 159 10*3/uL (ref 150–400)
RDW: 13.7 % (ref 11.5–15.5)
WBC: 6.1 10*3/uL (ref 4.0–10.5)

## 2013-02-06 LAB — GLUCOSE, CAPILLARY
Glucose-Capillary: 110 mg/dL — ABNORMAL HIGH (ref 70–99)
Glucose-Capillary: 119 mg/dL — ABNORMAL HIGH (ref 70–99)
Glucose-Capillary: 94 mg/dL (ref 70–99)

## 2013-02-06 MED ORDER — OXYCODONE HCL ER 15 MG PO T12A: 15.0000 mg | EXTENDED_RELEASE_TABLET | Freq: Two times a day (BID) | ORAL | Status: DC

## 2013-02-06 MED ORDER — PANCRELIPASE (LIP-PROT-AMYL) 12000-38000 UNITS PO CPEP: 2.0000 | ORAL_CAPSULE | Freq: Three times a day (TID) | ORAL | Status: DC

## 2013-02-06 MED ORDER — CYCLOBENZAPRINE HCL 10 MG PO TABS: 10.0000 mg | ORAL_TABLET | Freq: Three times a day (TID) | ORAL | Status: DC

## 2013-02-06 MED ORDER — LORAZEPAM 0.5 MG PO TABS: 0.5000 mg | ORAL_TABLET | Freq: Four times a day (QID) | ORAL | Status: DC | PRN

## 2013-02-06 MED ORDER — OXYCODONE HCL 10 MG PO TB12: 10.0000 mg | ORAL_TABLET | Freq: Two times a day (BID) | ORAL | Status: DC

## 2013-02-06 MED ORDER — SENNOSIDES-DOCUSATE SODIUM 8.6-50 MG PO TABS
1.0000 | ORAL_TABLET | Freq: Every day | ORAL | Status: DC
  Filled 2013-02-06: qty 1

## 2013-02-06 MED ORDER — OXYCODONE HCL ER 15 MG PO T12A
15.0000 mg | EXTENDED_RELEASE_TABLET | Freq: Two times a day (BID) | ORAL | Status: DC
  Administered 2013-02-06: 15 mg via ORAL
  Filled 2013-02-06: qty 1

## 2013-02-06 MED ORDER — OXYCODONE HCL 5 MG PO TABS
5.0000 mg | ORAL_TABLET | ORAL | Status: DC | PRN
  Administered 2013-02-06: 5 mg via ORAL
  Filled 2013-02-06: qty 1

## 2013-02-06 MED ORDER — OXYCODONE HCL 5 MG PO TABS: 5.0000 mg | ORAL_TABLET | ORAL | Status: DC | PRN

## 2013-02-06 NOTE — Progress Notes (Signed)
Clinical Social Work Department BRIEF PSYCHOSOCIAL ASSESSMENT 02/06/2013  Patient:  [name] [name], [name] [name]     Account Number:  [id number]     Admit date:  01/26/2013  Clinical Social Worker:  Orpah Greek  Date/Time:  02/05/2013 04:24 PM  Referred by:  Physician  Date Referred:  02/05/2013 Referred for  SNF Placement   Other Referral:   Interview type:  Patient Other interview type:   and son    PSYCHOSOCIAL DATA Living Status:  ALONE Admitted from facility:   Level of care:   Primary support name:  Nicolena, Schurman. (son) ph#: [phone] Primary support relationship to patient:  CHILD, ADULT Degree of support available:   good    CURRENT CONCERNS Current Concerns  Post-Acute Placement   Other Concerns:    SOCIAL WORK ASSESSMENT / PLAN CSW spoke with patient & son at bedside re: discharge planning. Note PT recommended SNF vs. HH. Patient has a panda tube, which will most likely be an issue with SNF placement.   Assessment/plan status:  Information/Referral to [facility] Other assessment/ plan:   Information/referral to community resources:   CSW completed FL2 and faxed information out to [place], [place], [place] & [place] counties - will follow-up with bed offers if available.    PATIENT'S/FAMILY'S RESPONSE TO PLAN OF CARE: Patient is hopeful that CSW will be able to locate a SNF that could handle a panda tube.        Unice Bailey, LCSW [facility] Clinical Social Worker cell #: [phone]

## 2013-02-06 NOTE — Progress Notes (Signed)
Physical Therapy Treatment Patient Details Name: [name] [name] MRN: [id number] DOB: [birth year] Today's Date: 02/06/2013 Time: 4098-1191 PT Time Calculation (min): 17 min  PT Assessment / Plan / Recommendation Comments on Treatment Session  Pt limited by pain this am and when scooting back into chair, pt had back spasm causing 10/10 pain for pt.  RN aware and giving pain medication.  Pt may benefit from having hospital bed at home at D/C as pt likes to control bed when getting in/out for pain control.     Follow Up Recommendations  SNF;Home health PT     Does the patient have the potential to tolerate intense rehabilitation     Barriers to Discharge        Equipment Recommendations  Hospital bed (may benefit from hospital bed. )    Recommendations for Other Services    Frequency Min 3X/week   Plan Discharge plan remains appropriate    Precautions / Restrictions Precautions Precautions: Fall Precaution Comments: Keep HOB elevated 2* NG tube Restrictions Weight Bearing Restrictions: No   Pertinent Vitals/Pain 10/10 pain with back spasms. RN aware, hot pack applied    Mobility  Bed Mobility Bed Mobility: Rolling Left;Left Sidelying to Sit Rolling Left: 5: Supervision Left Sidelying to Sit: 4: Min guard Details for Bed Mobility Assistance: Min/guard for LEs out of bed for safety with min cues for safety and technique.  Transfers Transfers: Sit to Stand;Stand to Sit Sit to Stand: 4: Min guard;From bed;From elevated surface Stand to Sit: 4: Min guard;With armrests;To chair/3-in-1 Details for Transfer Assistance: Min/guard for safety with cues for hand placement and safety.  Ambulation/Gait Ambulation/Gait Assistance: 4: Min guard Ambulation Distance (Feet): 50 Feet Assistive device: Rolling walker Ambulation/Gait Assistance Details: Pt very limited by pain this am.  She agreed to walk some in hallway and continues to require only min cues for technique.  Gait Pattern:  Step-through pattern Gait velocity: slow    Exercises     PT Diagnosis:    PT Problem List:   PT Treatment Interventions:     PT Goals Acute Rehab PT Goals PT Goal Formulation: With patient Time For Goal Achievement: 02/18/13 Potential to Achieve Goals: Fair Pt will go Supine/Side to Sit: with modified independence PT Goal: Supine/Side to Sit - Progress: Progressing toward goal Pt will go Sit to Stand: with modified independence PT Goal: Sit to Stand - Progress: Progressing toward goal Pt will Transfer Bed to Chair/Chair to Bed: with supervision PT Transfer Goal: Bed to Chair/Chair to Bed - Progress: Progressing toward goal Pt will Ambulate: with supervision;with rolling walker;51 - 150 feet PT Goal: Ambulate - Progress: Progressing toward goal  Visit Information  Last PT Received On: 02/06/13 Assistance Needed: +1    Subjective Data  Subjective: My back is hurting so bad.  Patient Stated Goal: to walk more   Cognition  Cognition Overall Cognitive Status: Appears within functional limits for tasks assessed/performed Arousal/Alertness: Awake/alert Orientation Level: Appears intact for tasks assessed Behavior During Session: [facility] for tasks performed    Balance     End of Session PT - End of Session Activity Tolerance: Patient limited by pain Patient left: in chair;with call bell/phone within reach;with family/visitor present Nurse Communication: Mobility status;Patient requests pain meds   GP     Vista Deck 02/06/2013, 10:00 AM

## 2013-02-06 NOTE — Progress Notes (Signed)
Little else to add at this point.  Tolerating tube feeds.  WBC normal.  Will need follow up CT in 4 - 6 weeks as long as she continues to improve.  May consider trying a low fat diet by mouth in a week or so to see if she can tolerate this and get off tube feeds.  Will sign off.  Needs follow up with Dr Carolynne Edouard in 4 - 6 weeks.

## 2013-02-06 NOTE — Discharge Summary (Addendum)
Physician Discharge Summary  [name] [name]:[id number] DOB: 1956/07/05 DOA: 01/26/2013  PCP: Kaleen Mask, MD  Admit date: 01/26/2013 Discharge date: 02/06/2013  Recommendations for Outpatient Follow-up:  1. Pt will need to follow up with PCP in 1 week post discharge 2. Please obtain BMP to evaluate electrolytes and kidney function 3. Please also check CBC to evaluate Hg and Hct levels 4. Call Dr. Willis Modena for followup appt in 3 weeks  Discharge Diagnoses:   Subacute pancreatitis/pancreatic pseudocyst/abdominal pain  -In the setting of recent gallstone pancreatitis  -Status post cholecystectomy 01/11/2013 secondary to gallstone pancreatitis  -Intraoperative cholangiogram suggested choledocholithiasis  -MRCP cannot be performed due to the patient's body habitus  -CT abdomen and pelvis January 16th and 01/27/2013 revealed pancreatic pseudocysts  -Patient readmitted 01/27/2013 due to increasing abdominal pain  -Endoscopic ultrasound 01/29/13 showing multiple pancreatic pseudocyst without any CBD obstruction--scattered peripancreatic edema and cystic collections. No obvious mass noted. No evidence of bile stones or thickening noted with normal ampulla  -Appreciate GI followup  -dobbhoff tube placed today(02/01/13), tolerating TF  -If patient is able to take pills with sips of water, long-acting opioids may be able to be used (MS Contin vs oxycontin)  -Appreciate Dr. Lubertha Basque assistance  -continue prn ativan  -Continue when necessary antiemetics  -Increase activity--PT eval and treat  -Home health was set up for physical therapy at home; attempts were made to set the patient up for skilled nursing facility, but there were no facilities available able to manage the patient with her PANDA tube Pain management  -per Palliative medicine  -changed to oxyContin today (02/13/13) and IV hydromorphone  -Discussed with Dr. Sibyl Parr OxyContin 50 mg twice a day x2 weeks, then OxyContin 10 mg  twice a day x2 weeks -Patient continues oxycodone 5 mg every 4 hours when necessary breakthrough pain -Flexeril 10 mg 3 times a day x10 additional days Chest pain-right infraclavicular  -Suspect musculoskeletal  -Pain is 100% reproducible  -EKG is reassuring  -Wells score--1.5  -Chest x-ray and right shoulder x-rays are negative  Right arm pain/superficial thrombus--basilic vein  -Unclear etiology  -venous duplex shows superficial thrombus at basilic v.  -Neurovascularly intact  -Referred pain from right shoulder??--Had right rotator cuff repair 10/11/2012  -Place peripheral IV  -Discontinue PICC line 02/04/13 - patient instructed to keep arm elevated and to call primary care physician or go to the emergency department immediately if there is worsening swelling, pain, erythema  Hypokalemia  -Repleted  -Check magnesium--1.9  Back spasms  -Robaxin changed to flexeril with improvement  -Ativan 0.5 mg each bedtime and when necessary anxiety and spasms -Continue Ativan 0.5 mg every 6 hours when necessary  Nutrition  -Gastroenterology wants patient to be on tube feedings, Vital 1.5 for at least 30 days -Patient is currently tolerating tube feeds without significant pain worsening  -Recheck electrolytes in the morning  -D/C TPN (02/02/13), patient is at goal rate for tube feeds  -Tube feeding was changed to a cyclic cycle with Vital 1.5 @80cc /hr to run from 4pm to 9AM daily;  Pt to get a break from 9AM to 4PM. -Home health RN has been set up to assist the patient in this endeavor  Discharge Condition: Stable  Disposition:  Follow-up Information   Follow up with Freddy Jaksch, MD. Call in 3 weeks.   Contact information:   [address] [place] [address] [phone]       Follow up with Kaleen Mask, MD In 1 week.  Contact information:   [address] [place] [address] [phone]       Diet: Vital Tube Feeding Wt Readings from Last 3  Encounters:  02/05/13 121.8 kg (268 lb 8.3 oz)  02/05/13 121.8 kg (268 lb 8.3 oz)  01/08/13 138.7 kg (305 lb 12.5 oz)    History of present illness:   57 year-old female who was just recently discharged after being treated for gallstone pancreatitis and at that time had laparoscopic cholecystectomy and cholangiogram were showing possibility of choledocholithiasis. MRCP was unable to be done due to the patient's weight. EUS was planned but not done and eventually planned to be done as outpatient. Patient had a CT which showed pseudocyst formation last time. Patient states that her abdomen pain had improved but since yesterday it started recurring and worsening. The pain is mostly epigastric in nature constant dull aching and sometimes stabbing with nausea. Denies any fever chills or diarrhea. Denies any shortness of breath or chest pain. Patient last time had respiratory failure from narcotics. Patient has been admitted for further management.    Consultants: IR for [facility]  Discharge Exam: Filed Vitals:   02/06/13 0502  BP: 100/58  Pulse: 74  Temp: 98 F (36.7 C)  Resp: 18   Filed Vitals:   02/05/13 0622 02/05/13 0627 02/05/13 2105 02/06/13 0502  BP: 106/47 96/56 98/48  100/58  Pulse: 81 83 88 74  Temp:  98.4 F (36.9 C) 98.5 F (36.9 C) 98 F (36.7 C)  TempSrc:  Oral Oral Oral  Resp:   20 18  Height:      Weight:  121.8 kg (268 lb 8.3 oz)    SpO2: 95%  94% 98%   General: A&O x 3, NAD, pleasant, cooperative Cardiovascular: RRR, no rub, no gallop, no S3 Respiratory: CTAB, no wheeze, no rhonchi Abdomen:soft, nontender, nondistended, positive bowel sounds Extremities: trace leg edema, No lymphangitis, no petechiae; right upper extremity 1+ edema without erythema, crepitance, lymphangitis, necrosis  Discharge Instructions      Discharge Orders   Future Orders Complete By Expires     Diet - low sodium heart healthy  As directed     Discharge instructions  As  directed     Comments:      Start OxyContin 15mg  twice a day x 14 days then start OxyContin 10mg  twice a day x 14 days after the 15mg  tabs are done. Oxycodone immediate release 5mg  every 4 hrs as needed for pain.    Increase activity slowly  As directed         Medication List    STOP taking these medications       HYDROcodone-acetaminophen 7.5-325 MG per tablet  Commonly known as:  NORCO     methocarbamol 750 MG tablet  Commonly known as:  ROBAXIN      TAKE these medications       beta carotene w/minerals tablet  Take 1 tablet by mouth daily.     bisacodyl 5 MG EC tablet  Generic drug:  bisacodyl  Take 5 mg by mouth daily as needed. For constipation     cholecalciferol 1000 UNITS tablet  Commonly known as:  VITAMIN D  Take 1,000 Units by mouth daily.     cyclobenzaprine 10 MG tablet  Commonly known as:  FLEXERIL  Take 1 tablet (10 mg total) by mouth 3 (three) times daily.     FLAX SEEDS PO  Take 1 capsule by mouth daily.  glucosamine-chondroitin 500-400 MG tablet  Take 1 tablet by mouth 2 (two) times daily.     lipase/protease/amylase 21308 UNITS Cpep  Commonly known as:  CREON-10/PANCREASE  Take 2 capsules by mouth 3 (three) times daily before meals.     LORazepam 0.5 MG tablet  Commonly known as:  ATIVAN  Take 1 tablet (0.5 mg total) by mouth every 6 (six) hours as needed for anxiety.     oxyCODONE 5 MG immediate release tablet  Commonly known as:  Oxy IR/ROXICODONE  Take 1 tablet (5 mg total) by mouth every 4 (four) hours as needed.     OxyCODONE 15 mg T12a  Commonly known as:  OXYCONTIN  Take 1 tablet (15 mg total) by mouth every 12 (twelve) hours.     oxyCODONE 10 MG 12 hr tablet  Commonly known as:  OXYCONTIN  Take 1 tablet (10 mg total) by mouth every 12 (twelve) hours. Start AFTER the OxyContin 15mg  tablets are finished     polyethylene glycol packet  Commonly known as:  MIRALAX / GLYCOLAX  Take 17 g by mouth daily.     STOOL SOFTENER PO   Take 1 capsule by mouth daily as needed. For constipation         The results of significant diagnostics from this hospitalization (including imaging, microbiology, ancillary and laboratory) are listed below for reference.    Significant Diagnostic Studies: Dg Neck Soft Tissue  01/31/2013  *RADIOLOGY REPORT*  Clinical Data: The patient requires enteral feeding.  NECK SOFT TISSUES - 1+ VIEW  Comparison: None.  Findings: The patient was brought to radiology for fluoro guided feeding tube placement.  When the image intensifier was moved up over the region of the neck, the patient became extremely claustrophobic and was unable to continue with the procedure.  IMPRESSION: Unable to place a feeding tube secondary to the patient's claustrophobia.   Original Report Authenticated By: Kennith Center, M.D.    Dg Chest 2 View  02/03/2013  *RADIOLOGY REPORT*  Clinical Data: Right-sided chest pain  CHEST - 2 VIEW  Comparison: Portable chest x-ray of 02/01/2013  Findings: No active infiltrate or effusion is seen.  Right PICC line is present with the tip seen to the lower SVC.  No pneumothorax is noted.  The heart is within normal limits in size  IMPRESSION:   Right PICC line tip seen to lower SVC.  No pneumothorax.  No active lung disease.   Original Report Authenticated By: Dwyane Dee, M.D.    Dg Shoulder Right  02/03/2013  *RADIOLOGY REPORT*  Clinical Data: 57 year old female right shoulder pain and decreased range of motion.  RIGHT SHOULDER - 2+ VIEW  Comparison: None.  Findings: Right PICC line catheter identified, tip at the lower SVC level. No glenohumeral joint dislocation.  Proximal right humerus intact.  Right clavicle and scapula intact.  Visible right ribs and lung parenchyma within normal limits. Bone mineralization is within normal limits.  IMPRESSION: No acute osseous abnormality identified about the right shoulder.   Original Report Authenticated By: Erskine Speed, M.D.    Dg Abd 1 View  02/03/2013   *RADIOLOGY REPORT*  Clinical Data: 57 year old female tube placement.  ABDOMEN - 1 VIEW  Comparison: 02/01/2013 and earlier.  Findings: Feeding tube configuration is stable, the weighted tip appears to be in the descending portion of the duodenum as before. The stomach appears more decompressed, with more gastric redundancy of the tube. Nonobstructed bowel gas pattern.  Extensive postoperative changes of the lumbar  spine again noted.  IMPRESSION: Stable feeding tube tip position in the descending portion of the duodenum.   Original Report Authenticated By: Erskine Speed, M.D.    Dg Cholangiogram Operative  01/11/2013  *RADIOLOGY REPORT*  Clinical Data:   Cholecystectomy.  INTRAOPERATIVE CHOLANGIOGRAM  Technique:  Cholangiographic images from the C-arm fluoroscopic device were submitted for interpretation post-operatively.  Please see the procedural report for the amount of contrast and the fluoroscopy time utilized.  Comparison:  Abdominal CT 01/06/2012  Findings:  The intrahepatic and extrahepatic bile ducts are dilated.  No contrast is identified within the duodenum.  There may be small filling defects within the common bile duct.  IMPRESSION: The biliary system is dilated and there is concern for a distal obstruction since no contrast is identified within the duodenum.  Small filling defects within the common bile duct may represent sludge or debris.   Original Report Authenticated By: Richarda Overlie, M.D.    Ct Angio Chest Pe W/cm &/or Wo Cm  01/07/2013  *RADIOLOGY REPORT*  Clinical Data: Left-sided flank pain.  Pain with inspiration.  CT ANGIOGRAPHY CHEST  Technique:  Multidetector CT imaging of the chest using the standard protocol during bolus administration of intravenous contrast. Multiplanar reconstructed images including MIPs were obtained and reviewed to evaluate the vascular anatomy.  Contrast: 80mL OMNIPAQUE IOHEXOL 350 MG/ML SOLN  Comparison: Chest CT 09/03/2011.  Findings:  Mediastinum: Study is  limited by large amount of patient respiratory motion.  With these limitations in mind, there is no evidence of central, lobar or proximal segmental sized pulmonary embolism.  Smaller distal segmental and subsegmental sized emboli cannot be completely excluded secondary to respiratory motion. Heart size is normal. There is no significant pericardial fluid, thickening or pericardial calcification.  No pathologically enlarged mediastinal or hilar lymph nodes. Esophagus is unremarkable in appearance.  Lungs/Pleura: Moderate left and trace right-sided pleural effusions.  Passive atelectasis in the dependent portion of the left lower lobe.  In addition, there is diffuse bronchial wall thickening with some thickening of the peribronchovascular interstitium and some patchy air space disease throughout the lungs bilaterally, predominately ground-glass in attenuation, suspicious for multifocal infection and/or inflammation.  This is relatively asymmetrically distributed.  Upper Abdomen: Perisplenic ascites.  Large amount of inflammatory changes in the retroperitoneum in the expected location of the tail of the pancreas, incompletely visualized.  Musculoskeletal: There are no aggressive appearing lytic or blastic lesions noted in the visualized portions of the skeleton.  IMPRESSION: 1.  Limited examination secondary to respiratory motion demonstrating no central, lobar or proximal segmental sized pulmonary embolism. 2.  The appearance of the lung parenchyma, as above, suggests multifocal infection and/or inflammation.  Specifically, the findings are most concerning for early changes of ARDS related to underlying pancreatitis. 3.  Moderate left and trace right-sided pleural effusions. 4.  Changes of pancreatitis incompletely visualized in the upper abdomen.   Original Report Authenticated By: Trudie Reed, M.D.    Ct Abdomen Pelvis W Contrast  01/27/2013  *RADIOLOGY REPORT*  Clinical Data: Status post cholecystectomy with  postop choledocholithiasis.  Gallstone pancreatitis with known pseudocysts.  CT ABDOMEN AND PELVIS WITH CONTRAST  Technique:  Multidetector CT imaging of the abdomen and pelvis was performed following the standard protocol during bolus administration of intravenous contrast.  Contrast:  100 ml Omnipaque-300 IV  Comparison: 01/15/2013  Findings: Linear scarring versus atelectasis at the left lung base. Trace left pleural effusion, improved.  Liver is within normal limits.  Spleen is notable for small  volume perisplenic fluid, likely reflecting a pseudocyst, improved.  In addition, at least four distinct peripancreatic pseudocysts are present: --9.4 x 4.9 cm collection adjacent to the pancreatic tail (series 2/image 22), previously 10.9 x 5.6 cm --4.7 x 7.8 cm collection anterior to the pancreatic body (series 2/image 32), previously well defined, measuring 5.8 x 9.0 cm --2.9 x 2.5 cm collection inferior to the pancreatic head (series 2/image 46), previously 3.5 x 2.9 cm --4.7 x 4.0 cm collection in the right mid abdomen (series 2/image 5), previously 4.9 x 4.9 cm  While all of these collections have a thin rim, compatible with pseudocysts, none of them demonstrate a thickened rim to suggest abscess.  No evidence of pancreatic necrosis.  Status post cholecystectomy.  No intrahepatic ductal dilatation. Stable common bile duct, measuring up to 2.0 cm in diameter, but tapering at the ampulla.  Left adrenal gland is mildly nodular/thickened, unchanged.  Right adrenal gland is unremarkable.  Kidneys are within normal limits.  No hydronephrosis.  No evidence of bowel obstruction.  Large abdominal hernia containing multiple loops of small and large bowel, unchanged. Colonic diverticulosis, without associated inflammatory changes.  Atherosclerotic calcifications of the abdominal aorta and branch vessels.  No abdominopelvic ascites.  No suspicious abdominopelvic lymphadenopathy.  Status post hysterectomy.  Bilateral ovaries  are unremarkable.  Bladder is within normal limits.  Degenerative changes of the lumbar spine.  Status post PLIF from L2- 4.  Status post ALIF at L4-5 and L5-S1.  IMPRESSION: Multiple peripancreatic and perisplenic pseudocysts, as described above, mildly improved.  No evidence of abscess.  Status post cholecystectomy.  Stable common bile duct.  No intrahepatic ductal dilatation.  Large abdominal hernia containing multiple loops of small and large bowel.  No evidence of bowel obstruction.   Original Report Authenticated By: Charline Bills, M.D.    Ct Abdomen Pelvis W Contrast  01/15/2013  *RADIOLOGY REPORT*  Clinical Data: Acute pancreatitis.  CT ABDOMEN AND PELVIS WITH CONTRAST  Technique:  Multidetector CT imaging of the abdomen and pelvis was performed following the standard protocol during bolus administration of intravenous contrast.  Contrast: OMNIPAQUE IOHEXOL 300 MG/ML  SOLN  Comparison: 01/05/2013.  Findings: The lung bases demonstrate bilateral pleural effusions, left greater than right with overlying atelectasis.  These are new.  The liver demonstrates stable intrahepatic biliary dilatation.  No focal hepatic lesions.  The gallbladder is surgically absent. Stable common bile duct dilatation.  The spleen is mildly enlarged but but no focal lesions.  There is a large complex fluid collection surrounding the spleen with a thin enhancing membrane. This is most likely a pseudocyst.  There are also multiple complex pseudocyst surrounding the pancreas and in the splenic hilum and continuing down into the small bowel mesentery.  The pancreas demonstrates normal enhancement.  No findings to suggest pancreatic necrosis.  No hemorrhage or pseudoaneurysm.  The pancreatic duct is normal in caliber.  The adrenal glands and kidneys are unremarkable.  The stomach, duodenum, small bowel and colon grossly normal.  No findings for small bowel obstruction.  There is a large abdominal wall hernia containing small  bowel and colon but no findings for obstruction/ incarceration.  There is extensive subcutaneous soft tissue swelling/edema which could suggest anasarca or cellulitis.  The bladder is unremarkable.  No pelvic mass or significant free pelvic fluid collection.  Surgical changes involving the lumbar spine are again demonstrated.  IMPRESSION:  1.  New large complex pseudocysts surrounding the pancreas and spleen and extending down into the  small bowel mesentery. 2.  No definite findings for abscess and no evidence of pancreatic necrosis. 3.  New bilateral pleural effusions, left greater than right with overlying atelectasis. 4.  Stable biliary dilatation. 5.  Large abdominal wall hernia. 6.  New subcutaneous edema could suggest anasarca or cellulitis.   Original Report Authenticated By: Rudie Meyer, M.D.    Dg Chest Port 1 View  02/01/2013  *RADIOLOGY REPORT*  Clinical Data: PICC line placement  PORTABLE CHEST - 1 VIEW  Comparison: 01/10/2013  Findings: Interval placement of a right PICC catheter with tip located over the lower SVC region.  No pneumothorax.  No pleural effusions.  Normal heart size and pulmonary vascularity.  No airspace disease or consolidation in the lungs.  IMPRESSION: PICC line appears to be in satisfactory position.  No pneumothorax. No active lung disease.   Original Report Authenticated By: Burman Nieves, M.D.    Dg Chest Port 1 View  01/10/2013  *RADIOLOGY REPORT*  Clinical Data: Follow-up  PORTABLE CHEST - 1 VIEW  Comparison: 01/09/2013  Findings: .  Lung volumes remain low.  To cardiac collapse / consolidation persists with left pleural effusion.  Vascular congestion again noted.  The radius trace amount of fluid in the minor fissure.  IMPRESSION: Slight improvement in overall aeration with persistent retrocardiac collapse / consolidation with left effusion.   Original Report Authenticated By: Kennith Center, M.D.    Dg Chest Port 1 View  01/09/2013  *RADIOLOGY REPORT*  Clinical Data:  Follow up  PORTABLE CHEST - 1 VIEW  Comparison: None.  Findings: Cardiomegaly with mild to moderate interstitial edema, increased.  Worsening opacification of the left mid lung, underlying pneumonia not excluded.  Moderate layering left pleural effusion.  No pneumothorax.  IMPRESSION: Cardiomegaly with mild to moderate interstitial edema, increased.  Moderate layering left pleural effusion.  Underlying left upper lobe pneumonia not excluded.   Original Report Authenticated By: Charline Bills, M.D.    Dg Chest Port 1 View  01/08/2013  *RADIOLOGY REPORT*  Clinical Data: Hypoxia.  PORTABLE CHEST - 1 VIEW  Comparison: CT chest 01/07/2013 and chest radiograph 01/01/2011.  Findings: Trachea is midline.  Heart size is accentuated by AP semi upright technique and low lung volumes.  There is central pulmonary vascular congestion with left perihilar and left lower lobe air space disease.  Mild diffuse interstitial prominence.  Small left pleural effusion.  IMPRESSION: Bilateral air space disease, left greater than right.  Left upper/left lower lobe pneumonia is queried. Probable underlying edema.   Original Report Authenticated By: Leanna Battles, M.D.    Dg Abd Portable 1v  01/30/2013  *RADIOLOGY REPORT*  Clinical Data: Nasogastric tube placement.  PORTABLE ABDOMEN - 1 VIEW  Comparison: CT exam 01/27/2013  Findings: Nasogastric tube is in place, tip overlying the level of the distal stomach.  Bowel gas pattern is nonobstructive.  There is residual contrast in the colonic loops and rectosigmoid colon. Multiple colonic diverticula are present. Bowel loops overlie the left hip, consistent with abdominal wall laxity.  The patient has had previous spinal surgery.  Surgical clips are present in the right upper quadrant of the abdomen.  IMPRESSION: Nasogastric tube tip overlying the level of the distal stomach.   Original Report Authenticated By: Norva Pavlov, M.D.    Dg Abd Portable 1v  01/08/2013  *RADIOLOGY REPORT*   Clinical Data: Pancreatitis, abdominal pain and vomiting.  PORTABLE ABDOMEN - 1 VIEW  Comparison: CT of the abdomen and pelvis from 01/05/2013.  Findings: There is  no evidence of bowel obstruction or significant ileus.  No gross signs of free air.  Extensive lumbar fusion hardware noted.  No abnormal calcifications identified.  IMPRESSION: No acute findings.   Original Report Authenticated By: Irish Lack, M.D.    Dg Intro Long Gi Tube  02/01/2013  *RADIOLOGY REPORT*  Clinical Data: Pancreatitis.  Panda tube placement  INTRO LONG GI TUBE  Technique: Under fluoroscopic guidance a feeding tube was introduced into the second portion of  the duodenum.  Placement was confirmed by injection of oral contrast.  Comparison:  None.  Findings: Under fluoroscopic guidance a feeding tube was introduced into the second portion of  the duodenum.  Placement was confirmed by injection of oral contrast. There is some redundant tube within the stomach. Stylet removed.  IMPRESSION: Placement of feeding tube within the second portion of the duodenum.   Original Report Authenticated By: Genevive Bi, M.D.      Microbiology: No results found for this or any previous visit (from the past 240 hour(s)).   Labs: Basic Metabolic Panel:  Recent Labs Lab 02/01/13 0850 02/03/13 0335 02/05/13 0450 02/06/13 0446  NA 137 135 135 135  K 3.9 3.9 4.1 4.2  CL 102 98 98 98  CO2 28 29 29 29   GLUCOSE 120* 122* 123* 127*  BUN 13 14 15 18   CREATININE 0.57 0.51 0.53 0.56  CALCIUM 8.9 8.8 9.0 8.8  MG 1.9 1.9 2.0  --   PHOS 3.7 3.1 4.0  --    Liver Function Tests:  Recent Labs Lab 02/01/13 0850  AST 13  ALT 22  ALKPHOS 97  BILITOT 0.3  PROT 6.5  ALBUMIN 2.9*   No results found for this basename: LIPASE, AMYLASE,  in the last 168 hours No results found for this basename: AMMONIA,  in the last 168 hours CBC:  Recent Labs Lab 02/01/13 0850 02/05/13 0450 02/06/13 0446  WBC 5.7 6.9 6.1  NEUTROABS  --   --  3.4   HGB 12.9 12.5 12.5  HCT 39.5 38.8 39.1  MCV 89.8 89.8 90.3  PLT 186 151 159   Cardiac Enzymes: No results found for this basename: CKTOTAL, CKMB, CKMBINDEX, TROPONINI,  in the last 168 hours BNP: No components found with this basename: POCBNP,  CBG:  Recent Labs Lab 02/05/13 1214 02/05/13 1737 02/06/13 0018 02/06/13 0556 02/06/13 1213  GLUCAP 125* 104* 110* 119* 94    Time coordinating discharge:  Greater than 30 minutes  Signed:  Makaela Cando, DO [facility] Pager: [phone] 02/06/2013, 12:31 PM

## 2013-02-06 NOTE — Progress Notes (Signed)
Subjective: Patient seen , still c/o abdominal and back pain. Says the pain medications are not helping. Patient was started on MS contin and prn morphine yesterday. She rates the pain as 7/10 in intesity in the abdomen and 8/10 in back and right sided chest pain. Xray of shoulder was negative for any pathology.  Basilic vein superficial thrombus noted on the venous duplex..Pain is better controlled with flexeril for muscle spasms.   Filed Vitals:   02/06/13 0502  BP: 100/58  Pulse: 74  Temp: 98 F (36.7 C)  Resp: 18    Chest: Clear Bilaterally Heart : S1S2 RRR Abdomen: Soft, nontender Ext : No edema Neuro: Alert, oriented x 3 Ext: No pain in shoulder elicited. Right upper extremity is swollen On movement, patient has  Upper right sided chest pain and is tender to palpation  A/P  Superficial thrombus in basilic vein Picc line  Removed Cold compress for swelling  Chronic pain Continue  OxyContin to 15  mg bid Change the dilaudid to prn oxycodone  Back muscle spasm Continue Flexeril 10 mg po TID for two weeks  Constipation Will start sennakot s at bedtime as long as she stays on opiates  Discussed with patient in detail, she has never been on opiates before. Would recommend to start taper down opiates after two weeks, change Oxycontin to 15 mg po BID at discharge. And change to 10 mg oxycontin after two weeks, and d/c Oxycontin after one month. She can take prn oxycodone for the pain. Patient agrees with the plan.  Meredeth Ide [facility]ist/Palliative Medicine Team [phone]

## 2013-02-06 NOTE — Progress Notes (Signed)
CSW was unable to find a SNF that could handle a panda tube. Confirmed with Assistant CSW Director, Wandra Mannan that SNFs are not equipped to handle a panda tube. Patient & Dr. Arbutus Leas made aware. Patient is agreeable to return home with home health. Son to transport home. CSW provided patient with list of SNFs for once the panda tube is discontinued. CSW informed patient that with her insurance ([employer] Medicare Complete) she would be able to be admitted to a SNF from home (rather than needing a 3-night inpatient hospital stay, as traditional Medicare requires). CSW signing off.   Clinical Social Work Department CLINICAL SOCIAL WORK PLACEMENT NOTE 02/06/2013  Patient:  [name] [name], [name] [name]  Account Number:  [id number] Admit date:  01/26/2013  Clinical Social Worker:  Orpah Greek  Date/time:  02/06/2013 04:23 PM  Clinical Social Work is seeking post-discharge placement for this patient at the following level of care:   SKILLED NURSING   (*CSW will update this form in Epic as items are completed)   02/06/2013  Patient/family provided with [facility] Department of Clinical Social Work's list of facilities offering this level of care within the geographic area requested by the patient (or if unable, by the patient's family).  02/06/2013  Patient/family informed of their freedom to choose among providers that offer the needed level of care, that participate in Medicare, Medicaid or managed care program needed by the patient, have an available bed and are willing to accept the patient.  02/06/2013  Patient/family informed of [facility]' ownership interest in [facility] as well as of the fact that they are under no obligation to receive care at this facility.  PASARR submitted to EDS on 02/06/2013 PASARR number received from EDS on 02/06/2013  FL2 transmitted to all facilities in geographic area requested by pt/family on  02/06/2013 FL2 transmitted to all facilities within larger  geographic area on 02/06/2013  Patient informed that his/her managed care company has contracts with or will negotiate with  certain facilities, including the following:     Patient/family informed of bed offers received:   Patient chooses bed at  Physician recommends and patient chooses bed at    Patient to be transferred to  on  02/06/2013 Patient to be transferred to facility by son's car  The following physician request were entered in Epic:   Additional Comments:  Unice Bailey, LCSW [facility] Clinical Social Worker cell #: [phone]

## 2013-02-11 ENCOUNTER — Emergency Department (HOSPITAL_COMMUNITY)
Admission: EM | Admit: 2013-02-11 | Discharge: 2013-02-11 | Disposition: A | Discharge status: Home / Self Care | Payer: Medicare Other | Attending: Emergency Medicine | Admitting: Emergency Medicine

## 2013-02-11 ENCOUNTER — Emergency Department (HOSPITAL_COMMUNITY): Payer: Medicare Other

## 2013-02-11 ENCOUNTER — Encounter (HOSPITAL_COMMUNITY): Payer: Self-pay | Admitting: Emergency Medicine

## 2013-02-11 DIAGNOSIS — Z8719 Personal history of other diseases of the digestive system: Secondary | ICD-10-CM | POA: Insufficient documentation

## 2013-02-11 DIAGNOSIS — Z87891 Personal history of nicotine dependence: Secondary | ICD-10-CM | POA: Insufficient documentation

## 2013-02-11 DIAGNOSIS — Z79899 Other long term (current) drug therapy: Secondary | ICD-10-CM | POA: Insufficient documentation

## 2013-02-11 DIAGNOSIS — Z8669 Personal history of other diseases of the nervous system and sense organs: Secondary | ICD-10-CM | POA: Insufficient documentation

## 2013-02-11 DIAGNOSIS — J45909 Unspecified asthma, uncomplicated: Secondary | ICD-10-CM | POA: Insufficient documentation

## 2013-02-11 DIAGNOSIS — Z8739 Personal history of other diseases of the musculoskeletal system and connective tissue: Secondary | ICD-10-CM | POA: Insufficient documentation

## 2013-02-11 DIAGNOSIS — K9429 Other complications of gastrostomy: Secondary | ICD-10-CM | POA: Insufficient documentation

## 2013-02-11 DIAGNOSIS — Z8701 Personal history of pneumonia (recurrent): Secondary | ICD-10-CM | POA: Insufficient documentation

## 2013-02-11 DIAGNOSIS — E785 Hyperlipidemia, unspecified: Secondary | ICD-10-CM | POA: Insufficient documentation

## 2013-02-11 DIAGNOSIS — Z8679 Personal history of other diseases of the circulatory system: Secondary | ICD-10-CM | POA: Insufficient documentation

## 2013-02-11 NOTE — ED Notes (Signed)
Patient transported to X-ray 
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
## 2013-02-11 NOTE — ED Notes (Signed)
Pt reports panda tube slid out last night and orange cap was down to lower abdomen area. Pt pushed tube back in but home health nurse advised her to come to ED incase she pushed it into her lungs. Pt denies shortness of breath at present. Pt did use feeding after pushing tube in herself.

## 2013-02-16 NOTE — Progress Notes (Signed)
LATE ENTRY    CARE MANAGEMENT NOTE 02/16/2013  Patient:  [name] [name], [name] [name]   Account Number:  [id number]  Date Initiated:  01/27/2013  Documentation initiated by:  [name] [name]  Subjective/Objective Assessment:   57 year old female admitted with abd pain.     Action/Plan:   Pt lived at home PTA, she is independent.   Anticipated DC Date:  01/30/2013   Anticipated DC Plan:  HOME/SELF CARE      DC Planning Services  CM consult      Choice offered to / List presented to:  C-1 Patient        HH arranged  HH-1 RN  HH-3 OT      HH agency  [name]   Status of service:  COMPLETED Medicare Important Message given?  NA - LOS <3 / Initial given by admissions (If response is "NO", the following Medicare IM given date fields will be blank)  Discharge Disposition:  HOME W HOME HEALTH SERVICES  Per UR Regulation:  Reviewed for med. necessity/level of care/duration of stay  Comments: ON CALL NOTE 02/06/2013-2030- Received several calls from the floor stating that the patient is at home and no one has delivered any tube feeding or equipment and a nurse has not arrived. Telephone call to [name] [name] with [facility] - patient was to get her tube feeding from [name] (at a reduced rate) and [name] [name] called the patient and stated that they could not deliver the tube feeding or DME until possibly friday due to the weather. CM called [name] and was informed that they closed several offices due to the weather and no one was going to come out. CM called [name] [name] with [name] informing her that the patient needs the tube feeding to eat and that she has a panda tube in and her TF should have been started at 4pm; [name] [name] called the office and has 2 boxes of formula delivered to the patient home. Also [facility] called for a nurse to go to the patient's home; [name] [name] - supervisor contacted the patient and informed her that someone would be out to see her.  02/07/2013- TCT [name] [name] at home -  patient stated that the tube feeding did not arrive until 12mn and a nurse never showed up until 8:30 am. VM left with [name] Keens RN with [facility] and [name] [name] updated.  02/06/2013- TALKED  TO PATIENT WITH [name] DALE RN WITH [facility] ABOUT DCP ARRANGEMENTS - HHRN WILL SEE THE PATIENT TONIGHT AT HOME FOR CYCLIC TUBE FEEDINGS AND DME WILL BE DELIVERED TO THE PATIENT'S HOME TODAY; PATIENT VERY EMOTIONAL, TEARFUL - ALL QUESTIONS ANSWERED AND LOTS OF EMOTIONAL SUPPORT GIVEN; [name] [name] RN,BSN,MHA  [name] Distance, RN Case Manager Signed CASE MANAGEMENT Progress Notes Service date: 02/05/2013 10:49 AM Talked to patient about discharge planning, patient lives alone, spouse died about 1 yr ago and patient is very emotional. Home health care choices given, patient chose [facility] for [name] services. Patient does not want to go to a facility short term for rehab and plans to return home. Patient stated that she will have someone stay with her for a few days; [name] Keens RN with [facility] called for arrangements; Attending MD please order HHRN/ PT at discharge; [name] [name]

## 2013-03-05 ENCOUNTER — Ambulatory Visit (HOSPITAL_COMMUNITY)
Admission: RE | Admit: 2013-03-05 | Discharge: 2013-03-05 | Disposition: A | Discharge status: Home / Self Care | Payer: Medicare Other | Source: Ambulatory Visit | Attending: Gastroenterology | Admitting: Gastroenterology

## 2013-03-05 ENCOUNTER — Other Ambulatory Visit (HOSPITAL_COMMUNITY): Payer: Self-pay | Admitting: Gastroenterology

## 2013-03-05 DIAGNOSIS — K9423 Gastrostomy malfunction: Secondary | ICD-10-CM | POA: Insufficient documentation

## 2013-03-06 ENCOUNTER — Telehealth (HOSPITAL_COMMUNITY): Payer: Self-pay | Admitting: *Deleted

## 2013-03-07 ENCOUNTER — Other Ambulatory Visit (HOSPITAL_COMMUNITY): Payer: Self-pay | Admitting: Gastroenterology

## 2013-03-19 ENCOUNTER — Other Ambulatory Visit: Payer: Self-pay | Admitting: Neurosurgery

## 2013-03-19 DIAGNOSIS — M48061 Spinal stenosis, lumbar region without neurogenic claudication: Secondary | ICD-10-CM

## 2013-03-27 ENCOUNTER — Other Ambulatory Visit: Payer: Medicare Other

## 2013-04-03 ENCOUNTER — Ambulatory Visit
Admission: RE | Admit: 2013-04-03 | Discharge: 2013-04-03 | Disposition: A | Discharge status: Home / Self Care | Payer: Medicare Other | Source: Ambulatory Visit | Attending: Neurosurgery | Admitting: Neurosurgery

## 2013-04-03 DIAGNOSIS — M48061 Spinal stenosis, lumbar region without neurogenic claudication: Secondary | ICD-10-CM

## 2013-04-03 MED ORDER — GADOBENATE DIMEGLUMINE 529 MG/ML IV SOLN
20.0000 mL | Freq: Once | INTRAVENOUS | Status: AC | PRN
  Administered 2013-04-03: 20 mL via INTRAVENOUS

## 2013-04-30 IMAGING — CR DG ABDOMEN 1V
2 series · 2 of 2 positions shown · non-contrast
Comparison: 02/03/2013

CLINICAL DATA: Feeding tube placement

ABDOMEN - 1 VIEW

[t abdomen supine (1 of 2)]
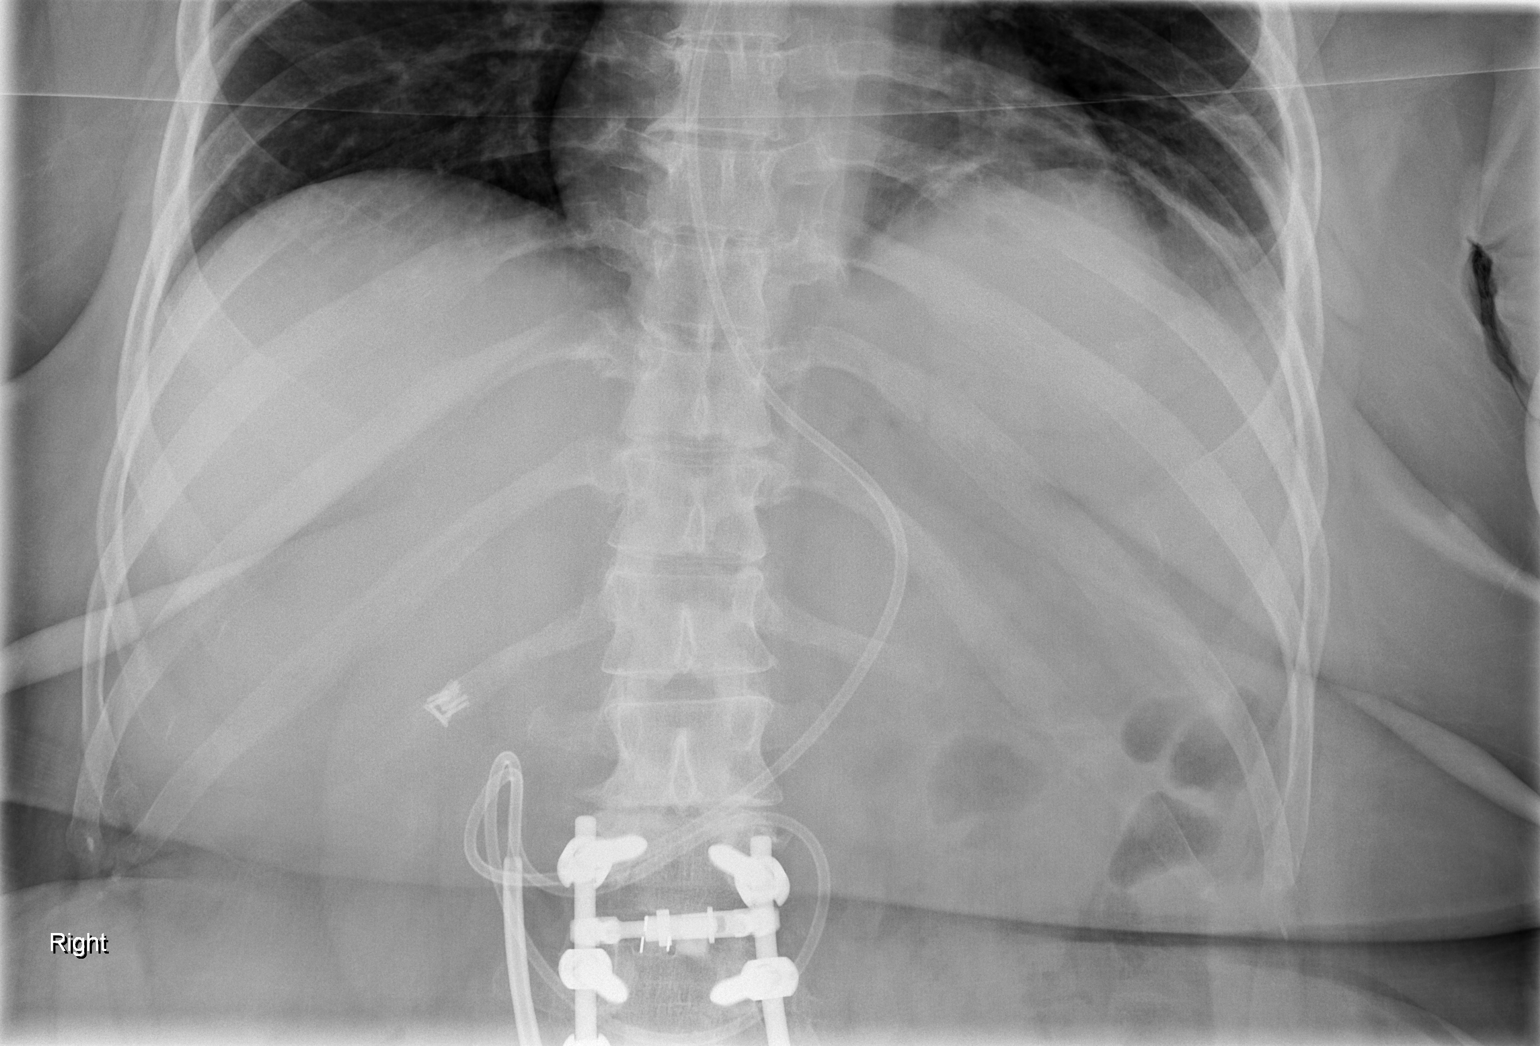

[t abdomen supine (2 of 2)]
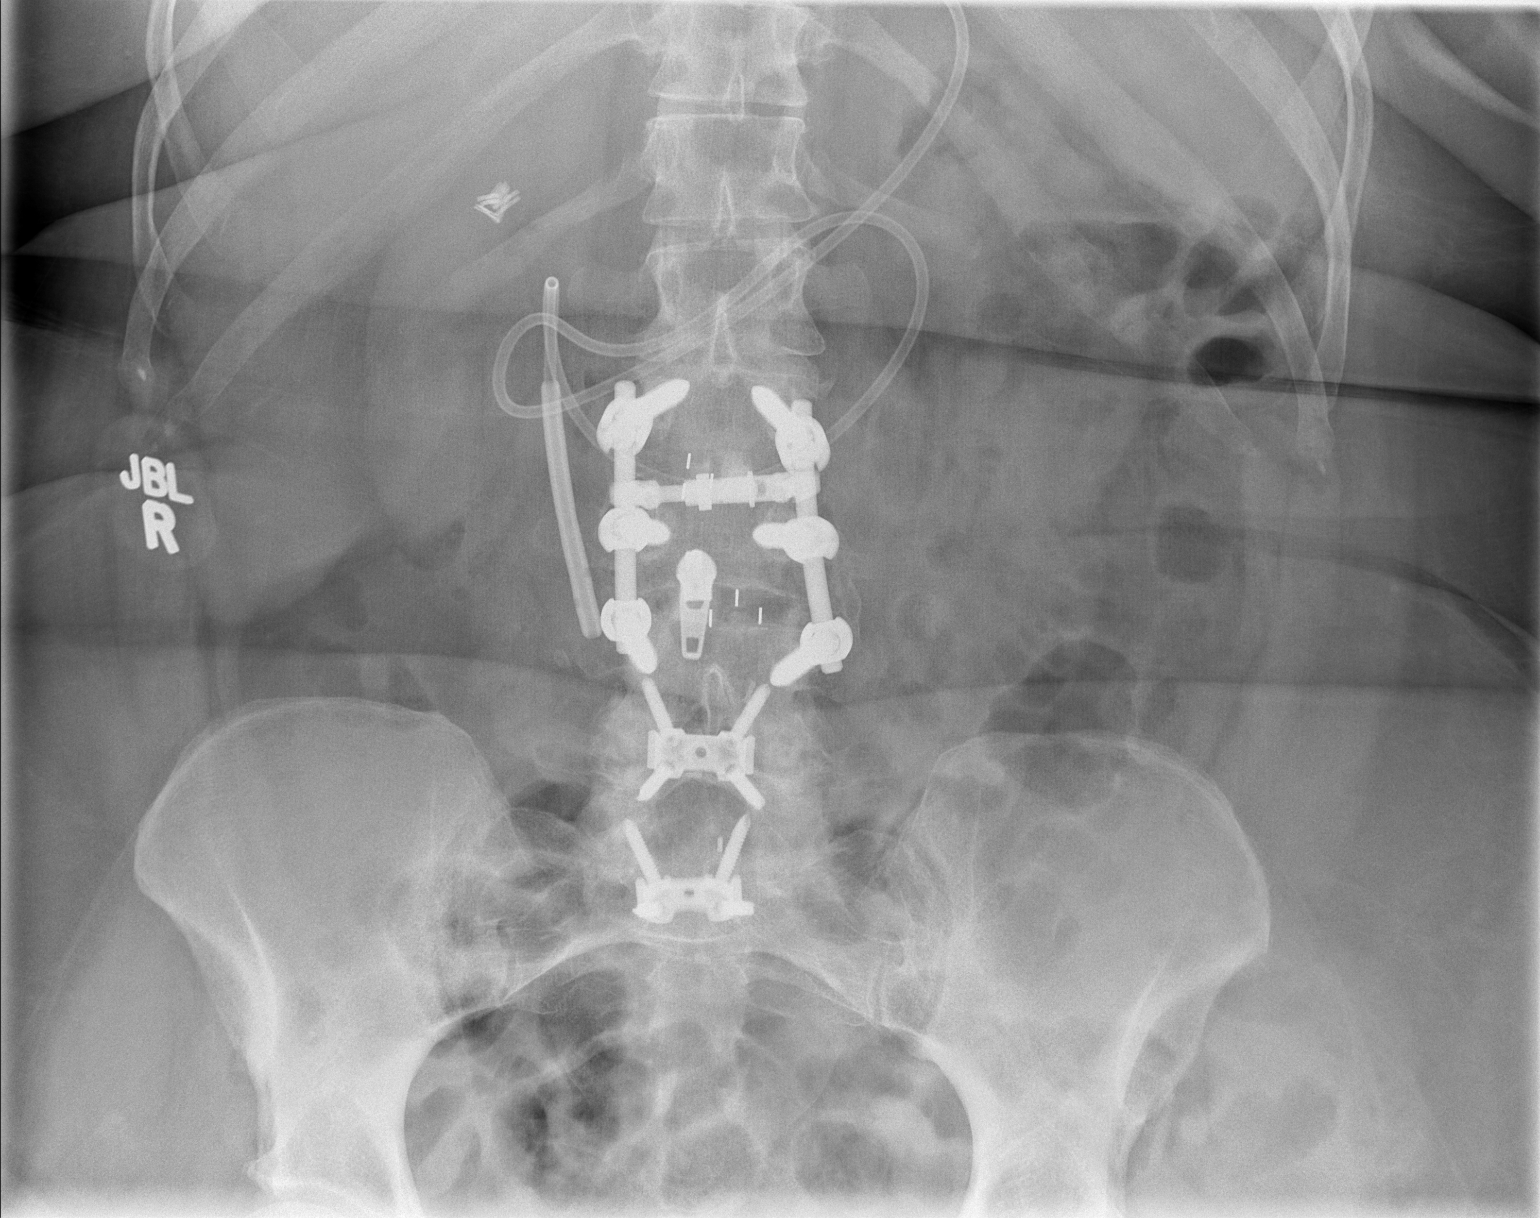

[2 of 2 positions shown; findings below may reference images not displayed]

FINDINGS: The tip of the feeding tube projects over the descending
duodenum.  No disproportionate dilatation of bowel.  No obvious
free intraperitoneal gas.  Postoperative changes in the lumbar
spine.
IMPRESSION: Feeding tube tip is in the descending duodenum.

## 2013-05-15 ENCOUNTER — Other Ambulatory Visit: Payer: Self-pay | Admitting: Neurosurgery

## 2013-05-22 ENCOUNTER — Encounter (HOSPITAL_COMMUNITY): Payer: Self-pay | Admitting: Pharmacy Technician

## 2013-05-22 IMAGING — XA IR MECH REMOVAL GI TUBE OBSTRUCTION
1 series · 2 of 2 positions shown · non-contrast
Comparison: 02/11/2013

CLINICAL DATA: Occluded feeding tube

MECH REMOVAL GI TUBE OBSTRUCTION

[Series 1: run · 2 of 2 slices shown]
[im 1/2]
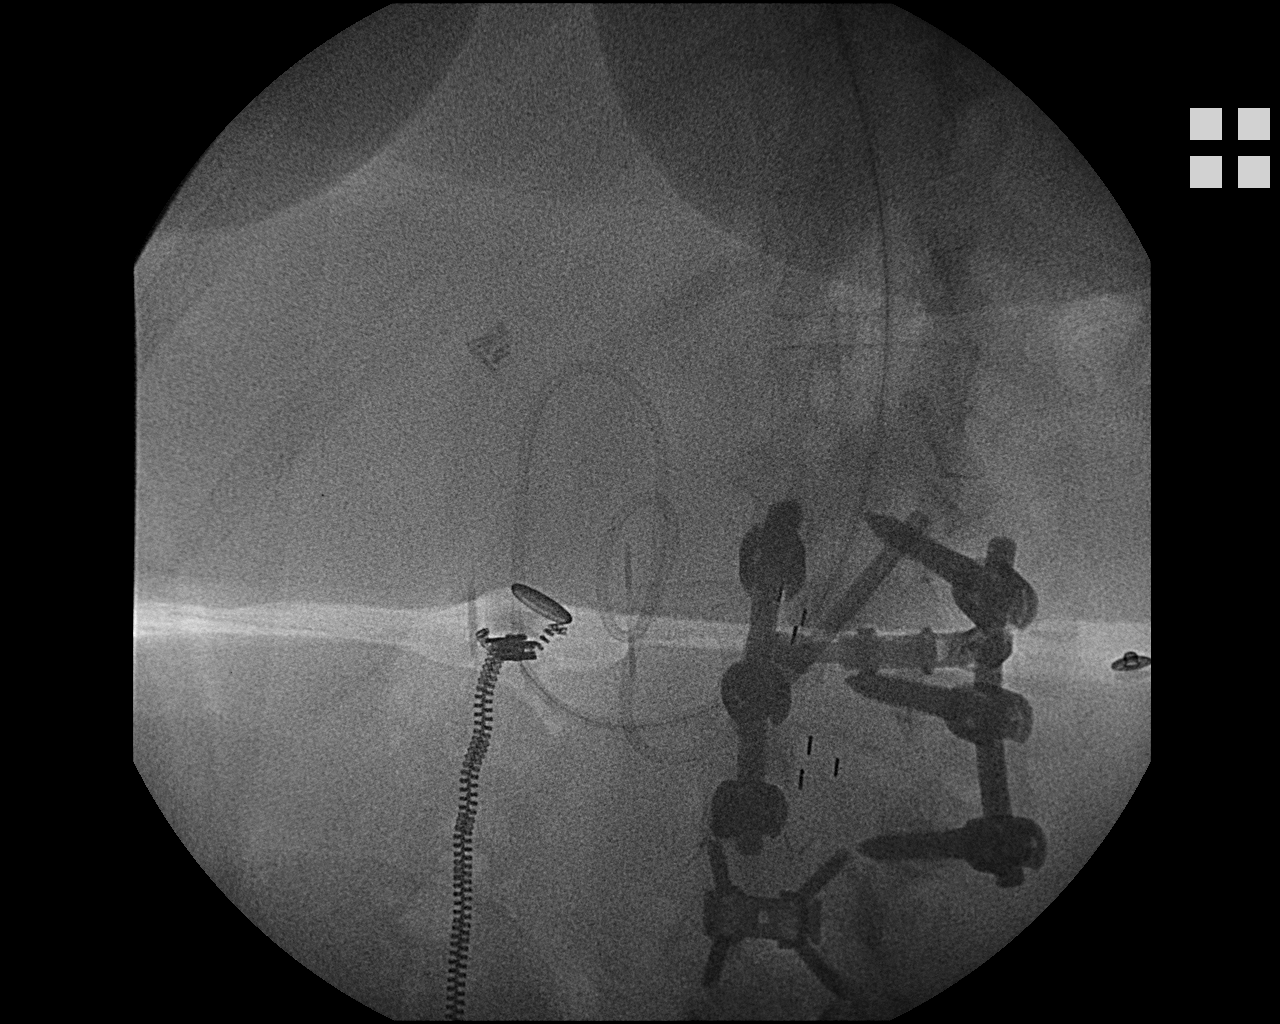
[im 2/2]
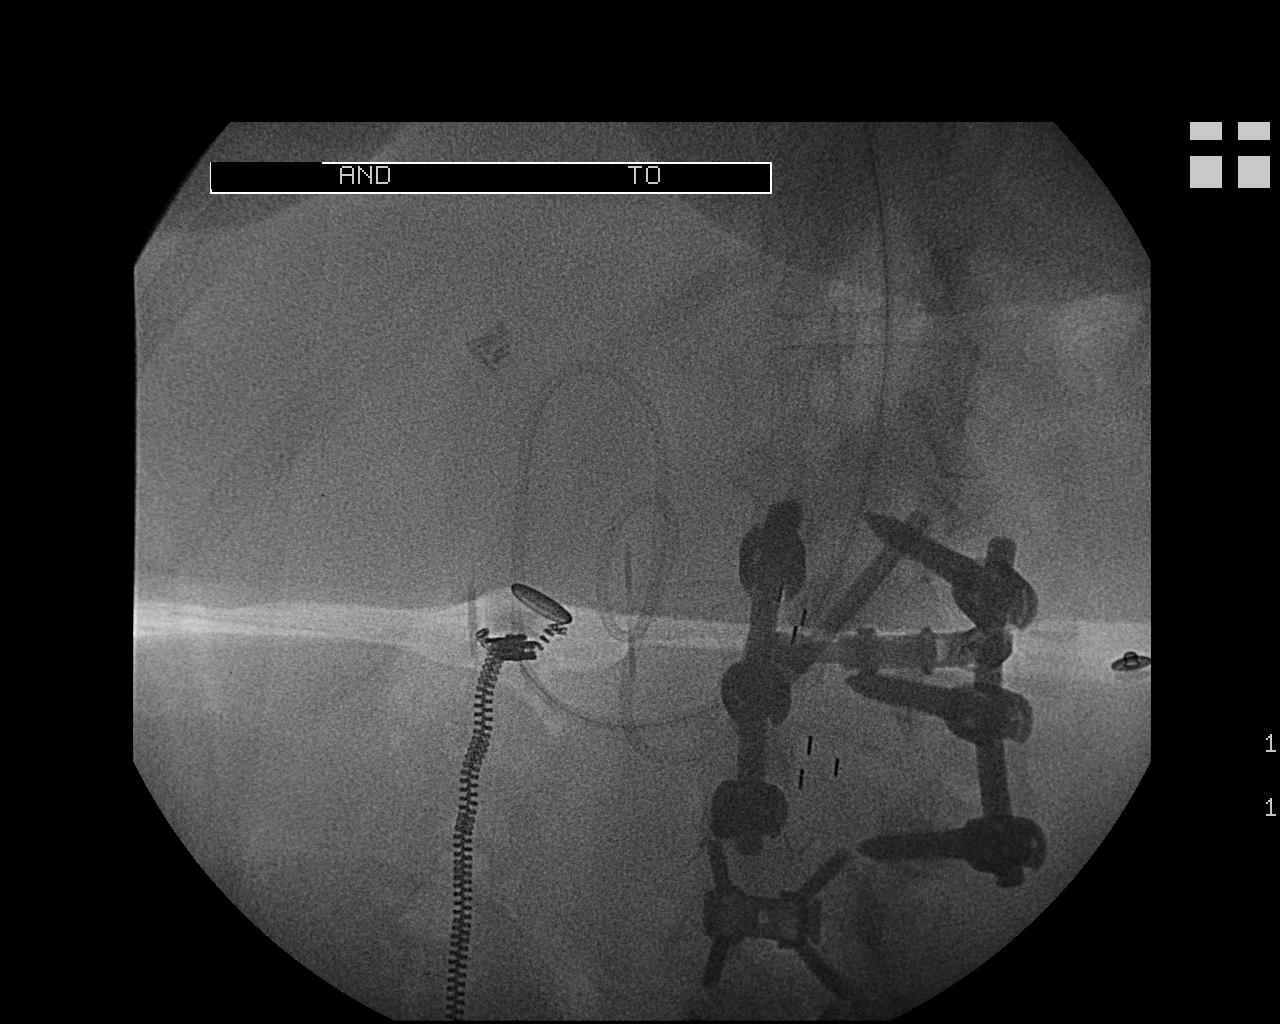

[2 of 2 positions shown; findings below may reference images not displayed]

FINDINGS: Existing feeding tube was manipulated with a stiff
Glidewire to relieve the intraluminal obstruction.  Feeding tube
ready for use.
IMPRESSION: Feeding tube successfully manipulated with a glide wire to relieve
the obstruction.  Ready for use.

## 2013-05-22 NOTE — Pre-Procedure Instructions (Signed)
MYSHA PEELER  05/22/2013   Your procedure is scheduled on:  05-28-2013  Monday  Report to [facility] at 5:30 AM.  Call this number if you have problems the morning of surgery: [phone]   Remember:   Do not eat food or drink liquids after midnight.    Take these medicines the morning of surgery with A SIP OF WATER: pain medication if needed.lorazepam if needed   Do not wear jewelry, make-up or nail polish.  Do not wear lotions, powders, or perfumes.   Do not shave 48 hours prior to surgery.   Do not bring valuables to the hospital.  Contacts, dentures or bridgework may not be worn into surgery.  Leave suitcase in the car. After surgery it may be brought to your room.   For patients admitted to the hospital, checkout time is 11:00 AM the day of discharge.       Special Instructions: Shower using CHG 2 nights before surgery and the night before surgery.  If you shower the day of surgery use CHG.  Use special wash - you have one bottle of CHG for all showers.  You should use approximately 1/3 of the bottle for each shower.   Please read over the following fact sheets that you were given: Pain Booklet, Coughing and Deep Breathing, Blood Transfusion Information and Surgical Site Infection Prevention

## 2013-05-23 ENCOUNTER — Encounter (HOSPITAL_COMMUNITY)
Admission: RE | Admit: 2013-05-23 | Discharge: 2013-05-23 | Disposition: A | Discharge status: Home / Self Care | Payer: Medicare Other | Source: Ambulatory Visit | Attending: Neurosurgery | Admitting: Neurosurgery

## 2013-05-23 ENCOUNTER — Encounter (HOSPITAL_COMMUNITY): Payer: Self-pay

## 2013-05-23 DIAGNOSIS — Z01812 Encounter for preprocedural laboratory examination: Secondary | ICD-10-CM | POA: Insufficient documentation

## 2013-05-23 DIAGNOSIS — Z01818 Encounter for other preprocedural examination: Secondary | ICD-10-CM | POA: Insufficient documentation

## 2013-05-23 HISTORY — DX: Essential (primary) hypertension: I10

## 2013-05-23 LAB — SURGICAL PCR SCREEN
MRSA, PCR: NEGATIVE
Staphylococcus aureus: NEGATIVE

## 2013-05-23 LAB — BASIC METABOLIC PANEL
CO2: 28 mEq/L (ref 19–32)
Calcium: 10.4 mg/dL (ref 8.4–10.5)
GFR calc Af Amer: >90 mL/min (ref >90)
GFR calc non Af Amer: >90 mL/min (ref >90)
Sodium: 139 mEq/L (ref 135–145)

## 2013-05-23 LAB — CBC WITH DIFFERENTIAL/PLATELET
Basophils Absolute: 0 10*3/uL (ref 0.0–0.1)
Lymphocytes Relative: 33 % (ref 12–46)
Lymphs Abs: 2.2 10*3/uL (ref 0.7–4.0)
Neutrophils Relative %: 56 % (ref 43–77)
Platelets: 178 10*3/uL (ref 150–400)
RBC: 5.31 MIL/uL — ABNORMAL HIGH (ref 3.87–5.11)
RDW: 14 % (ref 11.5–15.5)
WBC: 6.6 10*3/uL (ref 4.0–10.5)

## 2013-05-23 LAB — TYPE AND SCREEN
ABO/RH(D): A POS
Antibody Screen: NEGATIVE

## 2013-05-27 MED ORDER — CEFAZOLIN SODIUM-DEXTROSE 2-3 GM-% IV SOLR
2.0000 g | INTRAVENOUS | Status: AC
  Administered 2013-05-28: 2 g via INTRAVENOUS
  Filled 2013-05-27: qty 50

## 2013-05-28 ENCOUNTER — Encounter (HOSPITAL_COMMUNITY): Payer: Self-pay

## 2013-05-28 ENCOUNTER — Inpatient Hospital Stay (HOSPITAL_COMMUNITY): Payer: Medicare Other

## 2013-05-28 ENCOUNTER — Encounter (HOSPITAL_COMMUNITY)
Admission: RE | Disposition: A | Discharge status: Skilled Nursing Facility | Payer: Self-pay | Source: Ambulatory Visit | Attending: Neurosurgery

## 2013-05-28 ENCOUNTER — Inpatient Hospital Stay (HOSPITAL_COMMUNITY)
Admission: RE | Admit: 2013-05-28 | Discharge: 2013-05-31 | DRG: 460 | Disposition: A | Discharge status: Skilled Nursing Facility | Payer: Medicare Other | Source: Ambulatory Visit | Attending: Neurosurgery | Admitting: Neurosurgery

## 2013-05-28 ENCOUNTER — Ambulatory Visit (HOSPITAL_COMMUNITY): Payer: Medicare Other | Admitting: Anesthesiology

## 2013-05-28 ENCOUNTER — Encounter (HOSPITAL_COMMUNITY): Payer: Self-pay | Admitting: Anesthesiology

## 2013-05-28 DIAGNOSIS — Z87891 Personal history of nicotine dependence: Secondary | ICD-10-CM

## 2013-05-28 DIAGNOSIS — M48062 Spinal stenosis, lumbar region with neurogenic claudication: Principal | ICD-10-CM | POA: Diagnosis present

## 2013-05-28 DIAGNOSIS — M4000 Postural kyphosis, site unspecified: Secondary | ICD-10-CM | POA: Diagnosis present

## 2013-05-28 DIAGNOSIS — Z981 Arthrodesis status: Secondary | ICD-10-CM

## 2013-05-28 DIAGNOSIS — E785 Hyperlipidemia, unspecified: Secondary | ICD-10-CM | POA: Diagnosis present

## 2013-05-28 DIAGNOSIS — Z6841 Body Mass Index (BMI) 40.0 and over, adult: Secondary | ICD-10-CM

## 2013-05-28 HISTORY — PX: POSTERIOR FUSION PEDICLE SCREW PLACEMENT: SHX2186

## 2013-05-28 SURGERY — POSTERIOR LUMBAR FUSION 3 LEVEL
Anesthesia: General | Site: Back | Wound class: Clean

## 2013-05-28 MED ORDER — PHENOL 1.4 % MT LIQD: 1.0000 | OROMUCOSAL | Status: DC | PRN

## 2013-05-28 MED ORDER — HYDROMORPHONE HCL PF 1 MG/ML IJ SOLN
INTRAMUSCULAR | Status: AC
  Administered 2013-05-28: 0.5 mg via INTRAVENOUS
  Filled 2013-05-28: qty 1

## 2013-05-28 MED ORDER — OXYCODONE HCL 5 MG PO TABS
ORAL_TABLET | ORAL | Status: AC
  Filled 2013-05-28: qty 1

## 2013-05-28 MED ORDER — ONDANSETRON HCL 4 MG/2ML IJ SOLN
INTRAMUSCULAR | Status: DC | PRN
  Administered 2013-05-28: 4 mg via INTRAVENOUS

## 2013-05-28 MED ORDER — THROMBIN 20000 UNITS EX KIT
PACK | CUTANEOUS | Status: DC | PRN
  Administered 2013-05-28: 20000 [IU] via TOPICAL

## 2013-05-28 MED ORDER — FENTANYL CITRATE 0.05 MG/ML IJ SOLN
INTRAMUSCULAR | Status: DC | PRN
  Administered 2013-05-28 (×2): 25 ug via INTRAVENOUS
  Administered 2013-05-28: 75 ug via INTRAVENOUS
  Administered 2013-05-28: 50 ug via INTRAVENOUS
  Administered 2013-05-28: 25 ug via INTRAVENOUS
  Administered 2013-05-28 (×2): 50 ug via INTRAVENOUS
  Administered 2013-05-28: 150 ug via INTRAVENOUS

## 2013-05-28 MED ORDER — MIDAZOLAM HCL 5 MG/5ML IJ SOLN
INTRAMUSCULAR | Status: DC | PRN
  Administered 2013-05-28: 2 mg via INTRAVENOUS

## 2013-05-28 MED ORDER — PANCRELIPASE (LIP-PROT-AMYL) 12000-38000 UNITS PO CPEP
2.0000 | ORAL_CAPSULE | Freq: Three times a day (TID) | ORAL | Status: DC
  Administered 2013-05-28 – 2013-05-31 (×9): 2 via ORAL
  Filled 2013-05-28 (×12): qty 2

## 2013-05-28 MED ORDER — HYDROCODONE-ACETAMINOPHEN 5-325 MG PO TABS
1.0000 | ORAL_TABLET | ORAL | Status: DC | PRN
  Administered 2013-05-29: 2 via ORAL
  Administered 2013-05-30: 1 via ORAL
  Filled 2013-05-28: qty 1
  Filled 2013-05-28: qty 2

## 2013-05-28 MED ORDER — VITAMIN D3 25 MCG (1000 UNIT) PO TABS
1000.0000 [IU] | ORAL_TABLET | Freq: Every day | ORAL | Status: DC
  Administered 2013-05-29 – 2013-05-31 (×3): 1000 [IU] via ORAL
  Filled 2013-05-28 (×3): qty 1

## 2013-05-28 MED ORDER — ATENOLOL 25 MG PO TABS
25.0000 mg | ORAL_TABLET | Freq: Every day | ORAL | Status: DC
  Administered 2013-05-30 – 2013-05-31 (×2): 25 mg via ORAL
  Filled 2013-05-28 (×3): qty 1

## 2013-05-28 MED ORDER — 0.9 % SODIUM CHLORIDE (POUR BTL) OPTIME
TOPICAL | Status: DC | PRN
  Administered 2013-05-28: 1000 mL

## 2013-05-28 MED ORDER — CEFAZOLIN SODIUM 1-5 GM-% IV SOLN
1.0000 g | Freq: Three times a day (TID) | INTRAVENOUS | Status: AC
  Administered 2013-05-28 (×2): 1 g via INTRAVENOUS
  Filled 2013-05-28 (×3): qty 50

## 2013-05-28 MED ORDER — DIAZEPAM 5 MG PO TABS
ORAL_TABLET | ORAL | Status: AC
  Filled 2013-05-28: qty 1

## 2013-05-28 MED ORDER — ALUM & MAG HYDROXIDE-SIMETH 200-200-20 MG/5ML PO SUSP: 30.0000 mL | Freq: Four times a day (QID) | ORAL | Status: DC | PRN

## 2013-05-28 MED ORDER — DIAZEPAM 5 MG PO TABS
5.0000 mg | ORAL_TABLET | Freq: Four times a day (QID) | ORAL | Status: DC | PRN
  Administered 2013-05-28: 5 mg via ORAL
  Administered 2013-05-29 (×2): 10 mg via ORAL
  Administered 2013-05-29: 5 mg via ORAL
  Administered 2013-05-30: 10 mg via ORAL
  Administered 2013-05-30: 5 mg via ORAL
  Administered 2013-05-30: 10 mg via ORAL
  Administered 2013-05-31: 5 mg via ORAL
  Filled 2013-05-28: qty 2
  Filled 2013-05-28 (×2): qty 1
  Filled 2013-05-28 (×2): qty 2
  Filled 2013-05-28: qty 1
  Filled 2013-05-28: qty 2

## 2013-05-28 MED ORDER — BUPIVACAINE HCL (PF) 0.25 % IJ SOLN
INTRAMUSCULAR | Status: DC | PRN
  Administered 2013-05-28: 30 mL

## 2013-05-28 MED ORDER — HYDROMORPHONE HCL PF 1 MG/ML IJ SOLN
0.2500 mg | INTRAMUSCULAR | Status: DC | PRN
  Administered 2013-05-28 (×2): 0.5 mg via INTRAVENOUS

## 2013-05-28 MED ORDER — SODIUM CHLORIDE 0.9 % IV SOLN
INTRAVENOUS | Status: AC
  Filled 2013-05-28: qty 500

## 2013-05-28 MED ORDER — SENNA 8.6 MG PO TABS
1.0000 | ORAL_TABLET | Freq: Two times a day (BID) | ORAL | Status: DC
  Administered 2013-05-28 – 2013-05-31 (×6): 8.6 mg via ORAL
  Filled 2013-05-28 (×7): qty 1

## 2013-05-28 MED ORDER — HYDROMORPHONE HCL PF 1 MG/ML IJ SOLN
0.5000 mg | INTRAMUSCULAR | Status: DC | PRN
  Administered 2013-05-28 – 2013-05-29 (×4): 1 mg via INTRAVENOUS
  Filled 2013-05-28 (×4): qty 1

## 2013-05-28 MED ORDER — ACETAMINOPHEN 325 MG PO TABS: 650.0000 mg | ORAL_TABLET | ORAL | Status: DC | PRN

## 2013-05-28 MED ORDER — ACETAMINOPHEN 650 MG RE SUPP: 650.0000 mg | RECTAL | Status: DC | PRN

## 2013-05-28 MED ORDER — DEXAMETHASONE SODIUM PHOSPHATE 10 MG/ML IJ SOLN
10.0000 mg | INTRAMUSCULAR | Status: AC
  Administered 2013-05-28: 10 mg via INTRAVENOUS
  Filled 2013-05-28: qty 1

## 2013-05-28 MED ORDER — LORAZEPAM 0.5 MG PO TABS
0.5000 mg | ORAL_TABLET | Freq: Four times a day (QID) | ORAL | Status: DC | PRN
  Administered 2013-05-29 – 2013-05-30 (×2): 0.5 mg via ORAL
  Filled 2013-05-28 (×2): qty 1

## 2013-05-28 MED ORDER — GLYCOPYRROLATE 0.2 MG/ML IJ SOLN
INTRAMUSCULAR | Status: DC | PRN
  Administered 2013-05-28: .6 mg via INTRAVENOUS

## 2013-05-28 MED ORDER — MENTHOL 3 MG MT LOZG
1.0000 | LOZENGE | OROMUCOSAL | Status: DC | PRN
  Filled 2013-05-28: qty 9

## 2013-05-28 MED ORDER — POLYETHYLENE GLYCOL 3350 17 G PO PACK
17.0000 g | PACK | Freq: Every day | ORAL | Status: DC | PRN
  Administered 2013-05-29: 17 g via ORAL
  Filled 2013-05-28: qty 1

## 2013-05-28 MED ORDER — PROPOFOL 10 MG/ML IV BOLUS
INTRAVENOUS | Status: DC | PRN
  Administered 2013-05-28: 150 mg via INTRAVENOUS
  Administered 2013-05-28: 50 mg via INTRAVENOUS

## 2013-05-28 MED ORDER — LACTATED RINGERS IV SOLN
INTRAVENOUS | Status: DC | PRN
  Administered 2013-05-28 (×3): via INTRAVENOUS

## 2013-05-28 MED ORDER — SODIUM CHLORIDE 0.9 % IJ SOLN: 3.0000 mL | INTRAMUSCULAR | Status: DC | PRN

## 2013-05-28 MED ORDER — OXYCODONE-ACETAMINOPHEN 5-325 MG PO TABS
1.0000 | ORAL_TABLET | ORAL | Status: DC | PRN
  Administered 2013-05-28 – 2013-05-31 (×13): 2 via ORAL
  Filled 2013-05-28 (×13): qty 2

## 2013-05-28 MED ORDER — ONDANSETRON HCL 4 MG/2ML IJ SOLN: 4.0000 mg | INTRAMUSCULAR | Status: DC | PRN

## 2013-05-28 MED ORDER — PROMETHAZINE HCL 25 MG/ML IJ SOLN: 6.2500 mg | INTRAMUSCULAR | Status: DC | PRN

## 2013-05-28 MED ORDER — BACITRACIN 50000 UNITS IM SOLR
INTRAMUSCULAR | Status: AC
  Filled 2013-05-28: qty 1

## 2013-05-28 MED ORDER — FLEET ENEMA 7-19 GM/118ML RE ENEM: 1.0000 | ENEMA | Freq: Once | RECTAL | Status: AC | PRN

## 2013-05-28 MED ORDER — SUCCINYLCHOLINE CHLORIDE 20 MG/ML IJ SOLN
INTRAMUSCULAR | Status: DC | PRN
  Administered 2013-05-28: 120 mg via INTRAVENOUS

## 2013-05-28 MED ORDER — BISACODYL 10 MG RE SUPP: 10.0000 mg | Freq: Every day | RECTAL | Status: DC | PRN

## 2013-05-28 MED ORDER — LIDOCAINE HCL (CARDIAC) 20 MG/ML IV SOLN
INTRAVENOUS | Status: DC | PRN
  Administered 2013-05-28: 100 mg via INTRAVENOUS

## 2013-05-28 MED ORDER — NEOSTIGMINE METHYLSULFATE 1 MG/ML IJ SOLN
INTRAMUSCULAR | Status: DC | PRN
  Administered 2013-05-28: 4 mg via INTRAVENOUS

## 2013-05-28 MED ORDER — HEMOSTATIC AGENTS (NO CHARGE) OPTIME
TOPICAL | Status: DC | PRN
  Administered 2013-05-28: 1 via TOPICAL

## 2013-05-28 MED ORDER — OXYCODONE HCL 5 MG/5ML PO SOLN: 5.0000 mg | Freq: Once | ORAL | Status: AC | PRN

## 2013-05-28 MED ORDER — GLUCOSAMINE-CHONDROITIN 500-400 MG PO TABS: 1.0000 | ORAL_TABLET | Freq: Two times a day (BID) | ORAL | Status: DC

## 2013-05-28 MED ORDER — SODIUM CHLORIDE 0.9 % IJ SOLN
3.0000 mL | Freq: Two times a day (BID) | INTRAMUSCULAR | Status: DC
  Administered 2013-05-28 – 2013-05-31 (×2): 3 mL via INTRAVENOUS

## 2013-05-28 MED ORDER — PANCRELIPASE (LIP-PROT-AMYL) 12000-38000 UNITS PO CPEP
2.0000 | ORAL_CAPSULE | ORAL | Status: AC
  Administered 2013-05-28: 2 via ORAL
  Filled 2013-05-28: qty 2

## 2013-05-28 MED ORDER — SODIUM CHLORIDE 0.9 % IV SOLN
250.0000 mL | INTRAVENOUS | Status: DC
  Administered 2013-05-28: 250 mL via INTRAVENOUS

## 2013-05-28 MED ORDER — OXYCODONE HCL 5 MG PO TABS
5.0000 mg | ORAL_TABLET | Freq: Once | ORAL | Status: AC | PRN
  Administered 2013-05-28: 5 mg via ORAL

## 2013-05-28 MED ORDER — ROCURONIUM BROMIDE 100 MG/10ML IV SOLN
INTRAVENOUS | Status: DC | PRN
  Administered 2013-05-28: 10 mg via INTRAVENOUS
  Administered 2013-05-28: 20 mg via INTRAVENOUS
  Administered 2013-05-28: 50 mg via INTRAVENOUS

## 2013-05-28 MED ORDER — SODIUM CHLORIDE 0.9 % IR SOLN
Status: DC | PRN
  Administered 2013-05-28: 09:00:00

## 2013-05-28 SURGICAL SUPPLY — 65 items
ADH SKN CLS APL DERMABOND .7 (GAUZE/BANDAGES/DRESSINGS) ×1
APL SKNCLS STERI-STRIP NONHPOA (GAUZE/BANDAGES/DRESSINGS) ×1
BAG DECANTER FOR FLEXI CONT (MISCELLANEOUS) ×2 IMPLANT
BENZOIN TINCTURE PRP APPL 2/3 (GAUZE/BANDAGES/DRESSINGS) ×2 IMPLANT
BLADE SURG ROTATE 9660 (MISCELLANEOUS) IMPLANT
BRUSH SCRUB EZ PLAIN DRY (MISCELLANEOUS) ×2 IMPLANT
BUR MATCHSTICK NEURO 3.0 LAGG (BURR) ×2 IMPLANT
CANISTER SUCTION 2500CC (MISCELLANEOUS) ×2 IMPLANT
CAP LCK SPNE (Orthopedic Implant) ×12 IMPLANT
CAP LOCK SPINE RADIUS (Orthopedic Implant) IMPLANT
CAP LOCKING (Orthopedic Implant) ×24 IMPLANT
CLOTH BEACON ORANGE TIMEOUT ST (SAFETY) ×2 IMPLANT
CONT SPEC 4OZ CLIKSEAL STRL BL (MISCELLANEOUS) ×4 IMPLANT
COVER BACK TABLE 24X17X13 BIG (DRAPES) IMPLANT
COVER TABLE BACK 60X90 (DRAPES) ×2 IMPLANT
CROSSLINK LONG (Orthopedic Implant) ×1 IMPLANT
DECANTER SPIKE VIAL GLASS SM (MISCELLANEOUS) ×2 IMPLANT
DERMABOND ADVANCED (GAUZE/BANDAGES/DRESSINGS) ×1
DERMABOND ADVANCED .7 DNX12 (GAUZE/BANDAGES/DRESSINGS) ×1 IMPLANT
DRAPE C-ARM 42X72 X-RAY (DRAPES) ×5 IMPLANT
DRAPE LAPAROTOMY 100X72X124 (DRAPES) ×2 IMPLANT
DRAPE POUCH INSTRU U-SHP 10X18 (DRAPES) ×2 IMPLANT
DRAPE PROXIMA HALF (DRAPES) IMPLANT
DRAPE SURG 17X23 STRL (DRAPES) ×8 IMPLANT
DURAPREP 26ML APPLICATOR (WOUND CARE) ×2 IMPLANT
ELECT REM PT RETURN 9FT ADLT (ELECTROSURGICAL) ×2
ELECTRODE REM PT RTRN 9FT ADLT (ELECTROSURGICAL) ×1 IMPLANT
EVACUATOR 1/8 PVC DRAIN (DRAIN) ×2 IMPLANT
GAUZE SPONGE 4X4 16PLY XRAY LF (GAUZE/BANDAGES/DRESSINGS) IMPLANT
GLOVE BIOGEL M 8.0 STRL (GLOVE) ×1 IMPLANT
GLOVE BIOGEL PI IND STRL 7.0 (GLOVE) IMPLANT
GLOVE BIOGEL PI INDICATOR 7.0 (GLOVE) ×2
GLOVE ECLIPSE 8.5 STRL (GLOVE) ×4 IMPLANT
GLOVE EXAM NITRILE LRG STRL (GLOVE) IMPLANT
GLOVE EXAM NITRILE MD LF STRL (GLOVE) ×2 IMPLANT
GLOVE EXAM NITRILE XL STR (GLOVE) IMPLANT
GLOVE EXAM NITRILE XS STR PU (GLOVE) IMPLANT
GLOVE SS BIOGEL STRL SZ 6.5 (GLOVE) IMPLANT
GLOVE SUPERSENSE BIOGEL SZ 6.5 (GLOVE) ×3
GOWN BRE IMP SLV AUR LG STRL (GOWN DISPOSABLE) ×1 IMPLANT
GOWN BRE IMP SLV AUR XL STRL (GOWN DISPOSABLE) ×5 IMPLANT
GOWN STRL REIN 2XL LVL4 (GOWN DISPOSABLE) IMPLANT
KIT BASIN OR (CUSTOM PROCEDURE TRAY) ×2 IMPLANT
KIT INFUSE SMALL (Orthopedic Implant) ×1 IMPLANT
KIT ROOM TURNOVER OR (KITS) ×2 IMPLANT
MASTERGRAFT MATX STRIP 24CC (Orthopedic Implant) ×2 IMPLANT
MATRIX MASTERGRAFT STRIP 24CC (Orthopedic Implant) IMPLANT
NEEDLE HYPO 22GX1.5 SAFETY (NEEDLE) ×2 IMPLANT
NS IRRIG 1000ML POUR BTL (IV SOLUTION) ×2 IMPLANT
PACK LAMINECTOMY NEURO (CUSTOM PROCEDURE TRAY) ×2 IMPLANT
ROD HEX 180MM (Rod) ×2 IMPLANT
SCREW 5.75X40M (Screw) ×6 IMPLANT
SPONGE GAUZE 4X4 12PLY (GAUZE/BANDAGES/DRESSINGS) ×2 IMPLANT
SPONGE SURGIFOAM ABS GEL 100 (HEMOSTASIS) ×2 IMPLANT
STRIP CLOSURE SKIN 1/2X4 (GAUZE/BANDAGES/DRESSINGS) ×5 IMPLANT
SUT VIC AB 0 CT1 18XCR BRD8 (SUTURE) ×2 IMPLANT
SUT VIC AB 0 CT1 8-18 (SUTURE) ×4
SUT VIC AB 2-0 CT1 18 (SUTURE) ×4 IMPLANT
SUT VIC AB 3-0 SH 8-18 (SUTURE) ×5 IMPLANT
SYR 20ML ECCENTRIC (SYRINGE) ×2 IMPLANT
TAPE CLOTH SURG 4X10 WHT LF (GAUZE/BANDAGES/DRESSINGS) ×1 IMPLANT
TOWEL OR 17X24 6PK STRL BLUE (TOWEL DISPOSABLE) ×2 IMPLANT
TOWEL OR 17X26 10 PK STRL BLUE (TOWEL DISPOSABLE) ×2 IMPLANT
TRAY FOLEY CATH 14FRSI W/METER (CATHETERS) ×2 IMPLANT
WATER STERILE IRR 1000ML POUR (IV SOLUTION) ×2 IMPLANT

## 2013-05-28 NOTE — Progress Notes (Signed)
Orthopedic Tech Progress Note Patient Details:  [name] [name] 05/25/1956 [id number] Patient pre-fit with brace Patient ID: [name], female   DOB: March 22, 1956, 57 y.o.   MRN: [id number]   Orie Rout 05/28/2013, 1:27 PM

## 2013-05-28 NOTE — Progress Notes (Signed)
Utilization review completed.  

## 2013-05-28 NOTE — Progress Notes (Signed)
Patient received from PACU  At 1229 alert and oriented x 4 . Back dressing clean dry and intact . hemovac intact draining red drainage. Ice pack applied to back . Right IJ intact in  right neck. Foley in place . Oriented patient to room and call bell system . Reviewed safety protocol .continue with plan of care .                  Cleotilde Neer

## 2013-05-28 NOTE — Anesthesia Postprocedure Evaluation (Signed)
Anesthesia Post Note  Patient: [name] [name]  Procedure(s) Performed: Procedure(s) (LRB): POSTERIOR FUSION, Posterior Arthodesis, Thoracic Eleven-Twelve, Twelve-Lumbar One, Lumbar One Through Four.  (N/A)  Anesthesia type: general  Patient location: PACU  Post pain: Pain level controlled  Post assessment: Patient's Cardiovascular Status Stable  Last Vitals:  Filed Vitals:   05/28/13 1215  BP: 105/69  Pulse: 63  Temp: 36.2 C  Resp: 10    Post vital signs: Reviewed and stable  Level of consciousness: sedated  Complications: No apparent anesthesia complications

## 2013-05-28 NOTE — Brief Op Note (Signed)
05/28/2013  10:16 AM  PATIENT:  [name] [name]  57 y.o. female  PRE-OPERATIVE DIAGNOSIS:  kyphosis  POST-OPERATIVE DIAGNOSIS:  kyphosis  PROCEDURE:  Procedure(s) with comments: POSTERIOR FUSION, Posterior Arthodesis, Thoracic Eleven-Twelve, Twelve-Lumbar One, Lumbar One Through Four.  (N/A) - POSTERIOR FUSION, Posterior Arthodesis, Thoracic Eleven-Twelve, Twelve-Lumbar One, Lumbar One Through Four.   SURGEON:  Surgeon(s) and Role:    * Temple Pacini, MD - Primary    * Karn Cassis, MD - Assisting  PHYSICIAN ASSISTANT:   ASSISTANTS:    ANESTHESIA:   general  EBL:  Total I/O In: -  Out: 600 [Urine:350; Blood:250]  BLOOD ADMINISTERED:none  DRAINS: (Medium) Hemovact drain(s) in the Epidural space with  Suction Open   LOCAL MEDICATIONS USED:  MARCAINE     SPECIMEN:  No Specimen  DISPOSITION OF SPECIMEN:  N/A  COUNTS:  YES  TOURNIQUET:  * No tourniquets in log *  DICTATION: .Dragon Dictation  PLAN OF CARE: Admit to inpatient   PATIENT DISPOSITION:  PACU - hemodynamically stable.   Delay start of Pharmacological VTE agent (>24hrs) due to surgical blood loss or risk of bleeding: yes

## 2013-05-28 NOTE — Anesthesia Procedure Notes (Addendum)
Procedure Name: Intubation Date/Time: 05/28/2013 8:02 AM Performed by: Armandina Gemma Pre-anesthesia Checklist: Patient identified, Emergency Drugs available, Timeout performed, Suction available and Patient being monitored Patient Re-evaluated:Patient Re-evaluated prior to inductionOxygen Delivery Method: Circle system utilized Preoxygenation: Pre-oxygenation with 100% oxygen Intubation Type: IV induction Ventilation: Mask ventilation without difficulty Laryngoscope Size: Miller and 2 Grade View: Grade I Tube size: 7.0 mm Number of attempts: 1 Airway Equipment and Method: Stylet Placement Confirmation: ETT inserted through vocal cords under direct vision,  breath sounds checked- equal and bilateral and positive ETCO2 Secured at: 22 cm Tube secured with: Tape Dental Injury: Teeth and Oropharynx as per pre-operative assessment  Comments: RSI by Krista Blue- AM CRNA- intubation atraumatic- teeth and mouth as preop- very poor dentition lower bottom      Narrative:

## 2013-05-28 NOTE — Preoperative (Signed)
Beta Blockers   Reason not to administer Beta Blockers:Not Applicable 

## 2013-05-28 NOTE — Op Note (Signed)
Date of procedure: 05/28/2013  Date of dictation: Same  Service: Neurosurgery  Preoperative diagnosis: L1-L2 adjacent level stenosis with kyphosis.  Postoperative diagnosis: Same  Procedure Name: T11-L4 posterior lateral arthrodesis with segmental pedicle screw instrumentation local autograft, bone graft extender, and bone morphogenic protein.  Surgeon:Renezmae Canlas A.Keita Valley, M.D.  Asst. Surgeon: Jeral Fruit  Anesthesia: General  Indication: 57 year old female status post previous L2-S1 fusion with posterior instrumentation from L2-L4. Patient presents with worsening back and lower chamois symptoms consistent with instability from adjacent level disease at L1 to. Workup demonstrates evidence of marked kyphosis extreme disc degeneration and evidence of instability at L1 to. Patient has failed conservative management and the plan is to extend her fusion to T11 and hopes of providing better stability for pain control.  Operative note: After induction of anesthesia, patient positioned prone onto Wilson frame and appropriately padded. The thoracic and lumbar region prepped and draped sterilely. Incision made from T10-L4. Subperiosteal dissection performed bilaterally exposing lamina and facet joints of T11 T12-L1 L2-L3 and L4. Previously placed pedicle screw instrumentation from L2-L4 was disassembled and the rods were removed. Screws were left in place. Pedicles at L1-T12 and T11 were identified using surface landmarks and intraoperative fluoroscopy. Superficial bone overlying the pedicle was removed using high-speed drill.  each pedicle was then probed using a pedicle awl. Each pedicle awl track was probed and found to be solidly within bone. Each pedicle was then tapped with a 5.25 mm screw tap. Each screw tap hole was once again Road and found to be solidly within bone. 5.75 x 40 mm radius screws were placed bilaterally at T11-T12 and L1. All screws appeared to be well position and both intraoperative AP and  lateral fluoroscopic views. Spinous process was resected for local bone harvest and morselized. Posterior lateral bone was decorticated using high-speed drill. Morselized autograft mixed with master graft bone graft extender plus bone morphogenic-soaked sponges were packed posterior laterally for later fusion. Short segment titanium rod was then contoured and placed over the screw heads from T11-L4. Locking caps and placed over the screw heads. Locking caps and engaged with the construct under compression. Final images revealed good position of the bone graft and hardware at the proper upper level with normal alignment of the spine. Transverse connector was placed. A medium Hemovac drain was left at per space. Wounds and close in layers using Vicryl sutures. Steri-Strips triggers were applied. There were no apparent complications. Patient tolerated the procedure well and returned to the recovery room postop.

## 2013-05-28 NOTE — Progress Notes (Signed)
PHARMACIST - PHYSICIAN ORDER COMMUNICATION  CONCERNING: P&T Medication Policy on Herbal Medications  DESCRIPTION:  This patient's order for: Glucosamine-Chondroitin  has been noted.  This product(s) is classified as an "herbal" or natural product. Due to a lack of definitive safety studies or FDA approval, nonstandard manufacturing practices, plus the potential risk of unknown drug-drug interactions while on inpatient medications, the Pharmacy and Therapeutics Committee does not permit the use of "herbal" or natural products of this type within [facility].   ACTION TAKEN: The pharmacy department is unable to verify this order at this time and your patient has been informed of this safety policy. Please reevaluate patient's clinical condition at discharge and address if the herbal or natural product(s) should be resumed at that time.  Link Snuffer, PharmD, BCPS Clinical Pharmacist [phone] 05/28/2013,12:45 PM
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
## 2013-05-28 NOTE — Transfer of Care (Signed)
Immediate Anesthesia Transfer of Care Note  Patient: [name] [name]  Procedure(s) Performed: Procedure(s) with comments: POSTERIOR FUSION, Posterior Arthodesis, Thoracic Eleven-Twelve, Twelve-Lumbar One, Lumbar One Through Four.  (N/A) - POSTERIOR FUSION, Posterior Arthodesis, Thoracic Eleven-Twelve, Twelve-Lumbar One, Lumbar One Through Four.   Patient Location: PACU  Anesthesia Type:General  Level of Consciousness: awake, alert  and oriented  Airway & Oxygen Therapy: Patient Spontanous Breathing and Patient connected to face mask oxygen  Post-op Assessment: Report given to PACU RN and Post -op Vital signs reviewed and stable  Post vital signs: Reviewed  Complications: No apparent anesthesia complications

## 2013-05-28 NOTE — Anesthesia Preprocedure Evaluation (Addendum)
Anesthesia Evaluation  Patient identified by MRN, date of birth, ID band Patient awake    Reviewed: Allergy & Precautions, H&P , NPO status , Patient's Chart, lab work & pertinent test results, reviewed documented beta blocker date and time   Airway Mallampati: II TM Distance: >3 FB Neck ROM: Full    Dental  (+) Edentulous Upper and Dental Advisory Given   Pulmonary shortness of breath, asthma , former smoker,    Pulmonary exam normal       Cardiovascular hypertension,     Neuro/Psych    GI/Hepatic negative GI ROS, Neg liver ROS,   Endo/Other  Morbid obesity  Renal/GU negative Renal ROS     Musculoskeletal   Abdominal   Peds  Hematology negative hematology ROS (+)   Anesthesia Other Findings   Reproductive/Obstetrics                          Anesthesia Physical Anesthesia Plan  ASA: III  Anesthesia Plan: General   Post-op Pain Management:    Induction: Intravenous  Airway Management Planned: Oral ETT  Additional Equipment:   Intra-op Plan:   Post-operative Plan: Extubation in OR  Informed Consent: I have reviewed the patients History and Physical, chart, labs and discussed the procedure including the risks, benefits and alternatives for the proposed anesthesia with the patient or authorized representative who has indicated his/her understanding and acceptance.   Dental advisory given  Plan Discussed with: CRNA, Anesthesiologist and Surgeon  Anesthesia Plan Comments:        Anesthesia Quick Evaluation

## 2013-05-28 NOTE — Progress Notes (Signed)
Upper Dentures returned to patient.  Patient placed them herself.

## 2013-05-29 MED ORDER — SODIUM CHLORIDE 0.9 % IJ SOLN
10.0000 mL | INTRAMUSCULAR | Status: DC | PRN
  Administered 2013-05-29 – 2013-05-31 (×4): 10 mL
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
## 2013-05-29 NOTE — Progress Notes (Signed)
Postop day 1. Overall patient feels improved. Is noting some incisional pain but her feelings of weakness and instability have resolved. She ambulated with minimal assistance of therapy today.  She is afebrile. Her vitals are stable. Dressing dry. Drain output moderate. Urine output good. Neurologically awake and alert oriented and appropriate. Cranial nerve function intact. Motor and sensory function extremities normal.  Doing well postop. Continue efforts at mobilization. Patient may require skilled  nursing facility placement in the short-term secondary to social issues affecting her convalescence.

## 2013-05-29 NOTE — Progress Notes (Signed)
Foley d/c at 1230 and pt voided 350 at 1745

## 2013-05-29 NOTE — Evaluation (Signed)
Physical Therapy Evaluation Patient Details Name: [name] [name] MRN: [id number] DOB: 08-20-1956 Today's Date: 05/29/2013 Time: 1100-1120 PT Time Calculation (min): 20 min  PT Assessment / Plan / Recommendation Clinical Impression    Pt admitted with thoraco-lumbar fusion. Pt currently with functional limitations due to the deficits listed below (PT Problem List). Pt will benefit from skilled PT to increase their independence and safety with mobility to allow discharge to SNF prior to return home.      PT Assessment  Patient needs continued PT services    Follow Up Recommendations  SNF    Does the patient have the potential to tolerate intense rehabilitation      Barriers to Discharge Decreased caregiver support      Equipment Recommendations  None recommended by PT    Recommendations for Other Services     Frequency Min 5X/week    Precautions / Restrictions Precautions Precautions: Back;Fall Required Braces or Orthoses: Spinal Brace Spinal Brace: Lumbar corset;Applied in sitting position   Pertinent Vitals/Pain Back pain.  Repositioned.      Mobility  Bed Mobility Bed Mobility: Rolling Left;Left Sidelying to Sit;Sitting - Scoot to Edge of Bed Rolling Left: 4: Min assist;With rail Left Sidelying to Sit: 4: Min assist;With rails Sitting - Scoot to [place] of Bed: 4: Min assist Details for Bed Mobility Assistance: Assist to bring trunk up. Transfers Transfers: Sit to Stand;Stand to Sit Sit to Stand: 4: Min assist;With upper extremity assist;From bed Stand to Sit: 4: Min assist;With upper extremity assist;With armrests;To chair/3-in-1 Ambulation/Gait Ambulation/Gait Assistance: 4: Min guard Ambulation Distance (Feet): 150 Feet Assistive device: Rolling walker Ambulation/Gait Assistance Details: Verbal cues to stand more erect. Gait Pattern: Step-through pattern;Decreased stride length Gait velocity: decr    Exercises     PT Diagnosis: Difficulty walking;Generalized  weakness  PT Problem List: Decreased strength;Decreased mobility;Obesity PT Treatment Interventions: DME instruction;Gait training;Patient/family education;Functional mobility training;Therapeutic activities;Therapeutic exercise   PT Goals Acute Rehab PT Goals PT Goal Formulation: With patient Time For Goal Achievement: 06/12/13 Potential to Achieve Goals: Good Pt will Roll Supine to Right Side: with modified independence PT Goal: Rolling Supine to Right Side - Progress: Goal set today Pt will Roll Supine to Left Side: with modified independence PT Goal: Rolling Supine to Left Side - Progress: Goal set today Pt will go Supine/Side to Sit: with modified independence PT Goal: Supine/Side to Sit - Progress: Goal set today Pt will go Sit to Supine/Side: with modified independence PT Goal: Sit to Supine/Side - Progress: Goal set today Pt will go Sit to Stand: with modified independence PT Goal: Sit to Stand - Progress: Goal set today Pt will go Stand to Sit: with modified independence PT Goal: Stand to Sit - Progress: Goal set today Pt will Ambulate: >150 feet;with modified independence;with least restrictive assistive device PT Goal: Ambulate - Progress: Goal set today  Visit Information  Last PT Received On: 05/29/13 Assistance Needed: +1    Subjective Data  Subjective: Pt states she has had 7 back surgeries. Patient Stated Goal: Go to rehab and then home.   Prior Functioning  Home Living Lives With: Alone Type of Home: House Home Access: Ramped entrance Home Layout: One level Bathroom Shower/Tub: Forensic scientist: Standard Bathroom Accessibility: Yes How Accessible: Accessible via walker Home Adaptive Equipment: Straight cane;Walker - rolling;Grab bars around toilet Prior Function Level of Independence: Independent with assistive device(s) Driving: Yes Vocation: On disability Communication Communication: No difficulties Dominant Hand: Right     Cognition  Cognition Arousal/Alertness: Awake/alert Behavior During Therapy: WFL for tasks assessed/performed Overall Cognitive Status: Within Functional Limits for tasks assessed    Extremity/Trunk Assessment Right Lower Extremity Assessment RLE ROM/Strength/Tone: Deficits RLE ROM/Strength/Tone Deficits: grossly 4/5 Left Lower Extremity Assessment LLE ROM/Strength/Tone: Deficits LLE ROM/Strength/Tone Deficits: grossly 4/5   Balance Balance Balance Assessed: Yes Static Standing Balance Static Standing - Balance Support: Bilateral upper extremity supported Static Standing - Level of Assistance: 5: Stand by assistance  End of Session PT - End of Session Equipment Utilized During Treatment: Back brace Activity Tolerance: Patient tolerated treatment well Patient left: in chair;with call bell/phone within reach Nurse Communication: Mobility status  GP     [name] [name] 05/29/2013, 12:49 PM  [employer] PT [phone]

## 2013-05-30 MED FILL — Heparin Sodium (Porcine) Inj 1000 Unit/ML: INTRAMUSCULAR | Qty: 30 | Status: AC

## 2013-05-30 MED FILL — Sodium Chloride Irrigation Soln 0.9%: Qty: 3000 | Status: AC

## 2013-05-30 MED FILL — Sodium Chloride IV Soln 0.9%: INTRAVENOUS | Qty: 1000 | Status: AC

## 2013-05-30 NOTE — Progress Notes (Signed)
Physical Therapy Treatment Patient Details Name: [name] [name] MRN: [id number] DOB: 1956/12/16 Today's Date: 05/30/2013 Time: 4696-2952 PT Time Calculation (min): 13 min  PT Assessment / Plan / Recommendation Comments on Treatment Session  Pt s/p T11-L4 fusion.  Pt not feeling as well today.     Follow Up Recommendations  SNF     Does the patient have the potential to tolerate intense rehabilitation     Barriers to Discharge        Equipment Recommendations  None recommended by PT    Recommendations for Other Services    Frequency Min 5X/week   Plan Discharge plan remains appropriate;Frequency remains appropriate    Precautions / Restrictions Precautions Precautions: Back;Fall Required Braces or Orthoses: Spinal Brace Spinal Brace: Lumbar corset;Applied in standing position;Other (comment) (too much body mass to apply in seated position) Spinal Brace Comments: Spinal brace applied in standing position because Pt unable to apply sitting due to amt of body mass Restrictions Weight Bearing Restrictions: No   Pertinent Vitals/Pain Pain in back and rt thigh.  Repositioned and pt requested valium or pain meds.    Mobility  Bed Mobility Bed Mobility: Sit to Sidelying Right Rolling Left: 3: Mod assist;With rail;Other (comment) (OTS observed rolling - done with RN as OTS entered room) Left Sidelying to Sit: HOB elevated;With rails;4: Min guard Sitting - Scoot to [place] of Bed: 4: Min guard Sit to Sidelying Right: 4: Min assist;HOB flat Details for Bed Mobility Assistance: Assist to bring legs up. Transfers Sit to Stand: 4: Min assist;With upper extremity assist;With armrests;From chair/3-in-1 Stand to Sit: 4: Min guard;With upper extremity assist;To bed Details for Transfer Assistance: Assist to bring hips out of chair. Ambulation/Gait Ambulation/Gait Assistance: 4: Min guard Ambulation Distance (Feet): 150 Feet Assistive device: Rolling walker Ambulation/Gait Assistance  Details: verbal cues to stand more erect. Gait Pattern: Step-through pattern;Decreased stride length Gait velocity: decr    Exercises     PT Diagnosis:    PT Problem List:   PT Treatment Interventions:     PT Goals Acute Rehab PT Goals PT Goal: Sit to Supine/Side - Progress: Progressing toward goal PT Goal: Sit to Stand - Progress: Progressing toward goal PT Goal: Stand to Sit - Progress: Progressing toward goal PT Goal: Ambulate - Progress: Progressing toward goal  Visit Information  Last PT Received On: 05/30/13 Assistance Needed: +1    Subjective Data  Subjective: Pt states she didn't sleep well last night and doesn't feel as good today.   Cognition  Cognition Arousal/Alertness: Awake/alert Behavior During Therapy: WFL for tasks assessed/performed Overall Cognitive Status: Within Functional Limits for tasks assessed    Balance  Static Standing Balance Static Standing - Balance Support: Bilateral upper extremity supported Static Standing - Level of Assistance: 5: Stand by assistance  End of Session PT - End of Session Equipment Utilized During Treatment: Back brace Activity Tolerance: Patient limited by pain Patient left: in bed;with call bell/phone within reach;with family/visitor present Nurse Communication: Patient requests pain meds   GP     [facility] 05/30/2013, 9:56 AM  [facility] PT [phone]

## 2013-05-30 NOTE — Progress Notes (Signed)
Occupational Therapy Treatment Patient Details Name: [name] [name] MRN: [id number] DOB: 1956/01/29 Today's Date: 05/30/2013 Time: 1191-4782 OT Time Calculation (min): 32 min  OT Assessment / Plan / Recommendation Comments on Treatment Session Pt is a 57 y o F PLIF of T11-L5. Focused on sink level ADL and bed mobility. Pt with good recall of precautions. Next session should address LB dressing.    Follow Up Recommendations  SNF             Frequency Min 2X/week   Plan Discharge plan remains appropriate    Precautions / Restrictions Precautions Precautions: Back;Fall Required Braces or Orthoses: Spinal Brace Spinal Brace: Lumbar corset;Applied in standing position;Other (comment) (too much body mass to apply in seated position) Spinal Brace Comments: Spinal brace applied in standing position because Pt unable to apply sitting due to amt of body mass Restrictions Weight Bearing Restrictions: No   Pertinent Vitals/Pain Pt reported 5/10 pain    ADL  Grooming: Wash/dry hands;Wash/dry face;Teeth care;Denture care;Set up Where Assessed - Grooming: Unsupported standing;Other (comment) (UE support on sink surface) Toilet Transfer: Performed;Min guard Statistician Method: Sit to Barista: Comfort height toilet;Grab bars Toileting - Clothing Manipulation and Hygiene: Performed;Moderate assistance Where Assessed - Toileting Clothing Manipulation and Hygiene: Sit to stand from 3-in-1 or toilet Equipment Used: Back brace;Gait belt;Rolling walker Transfers/Ambulation Related to ADLs: Pt with incr time for transfers, but maintains precautions with no vc's. Pt at min guard level for transfers with UE assist. ADL Comments: Pt recalled 3/3 precautions with no vc's needed. Pt performed ADL at sink level with UE on sink for support/balance. Pt donned brace with min (A) to navigate IV lines and drain.     OT Goals Acute Rehab OT Goals OT Goal Formulation: With  patient Time For Goal Achievement: 06/12/13 Potential to Achieve Goals: Good ADL Goals Pt Will Perform Grooming: with modified independence;Standing at sink;Unsupported ADL Goal: Grooming - Progress: Updated due to goal met Pt Will Perform Upper Body Bathing: Sit to stand from chair;with set-up ADL Goal: Upper Body Bathing - Progress: Progressing toward goals Pt Will Perform Upper Body Dressing: with set-up;Sit to stand from chair Pt Will Perform Toileting - Hygiene: Sit to stand from 3-in-1/toilet;with supervision ADL Goal: Toileting - Hygiene - Progress: Updated due to goal met Miscellaneous OT Goals Miscellaneous OT Goal #1: Pt will complete bed mobilty MOD I as precursor to adls OT Goal: Miscellaneous Goal #1 - Progress: Progressing toward goals  Visit Information  Last OT Received On: 05/30/13 Assistance Needed: +1    Subjective Data   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, and be able to enjoy her grandbaby (girl) when she comes in September      Cognition  Cognition Arousal/Alertness: Awake/alert Behavior During Therapy: WFL for tasks assessed/performed Overall Cognitive Status: Within Functional Limits for tasks assessed    Mobility  Bed Mobility Bed Mobility: Rolling Left;Left Sidelying to Sit;Sitting - Scoot to Edge of Bed Rolling Left: 3: Mod assist;With rail;Other (comment) (OTS observed rolling - done with RN as OTS entered room) Left Sidelying to Sit: HOB elevated;With rails;4: Min guard Sitting - Scoot to [place] of Bed: 4: Min guard Details for Bed Mobility Assistance: Pt log rolled to left with mod assist from RN with BLE. Pt used rails and HOB elevated but performed rest of bed mobility min guard. Transfers Transfers: Sit to Stand;Stand to Sit Sit to Stand: 4: Min guard;With upper extremity assist;From bed;From  toilet Stand to Sit: 4: Min guard;With upper extremity assist;To bed;To chair/3-in-1;To toilet Details for Transfer  Assistance: Pt had improved hand placement, and needed min cues.          End of Session OT - End of Session Equipment Utilized During Treatment: Gait belt;Back brace;Other (comment) (RW) Activity Tolerance: Patient tolerated treatment well Patient left: in chair;with call bell/phone within reach Nurse Communication: Mobility status;Precautions  GO     Sherryl Manges 05/30/2013, 9:18 AM

## 2013-05-30 NOTE — Progress Notes (Signed)
Clinical Social Work Department CLINICAL SOCIAL WORK PLACEMENT NOTE 05/30/2013  Patient:  AREONNA, BRAN  Account Number:  [id number] Admit date:  05/28/2013  Clinical Social Worker:  Robin Searing  Date/time:  05/30/2013 03:25 PM  Clinical Social Work is seeking post-discharge placement for this patient at the following level of care:   SKILLED NURSING   (*CSW will update this form in Epic as items are completed)   05/30/2013  Patient/family provided with [facility] Department of Clinical Social Work's list of facilities offering this level of care within the geographic area requested by the patient (or if unable, by the patient's family).  05/30/2013  Patient/family informed of their freedom to choose among providers that offer the needed level of care, that participate in Medicare, Medicaid or managed care program needed by the patient, have an available bed and are willing to accept the patient.  05/30/2013  Patient/family informed of [facility]' ownership interest in [facility], as well as of the fact that they are under no obligation to receive care at this facility.  PASARR submitted to EDS on 05/30/2013 PASARR number received from EDS on 05/30/2013  FL2 transmitted to all facilities in geographic area requested by pt/family on  05/30/2013 FL2 transmitted to all facilities within larger geographic area on   Patient informed that his/her managed care company has contracts with or will negotiate with  certain facilities, including the following:     Patient/family informed of bed offers received:   Patient chooses bed at  Physician recommends and patient chooses bed at    Patient to be transferred to  on   Patient to be transferred to facility by   The following physician request were entered in Epic:   Additional Comments: Reece Levy, MSW, Theresia Majors [phone]

## 2013-05-30 NOTE — Progress Notes (Signed)
I agree with the following treatment note after reviewing documentation.   Johnston, Asier Desroches Brynn   OTR/L Pager: [phone] Office: [phone] .   

## 2013-05-30 NOTE — Progress Notes (Signed)
Postop day 2. Patient more sore today but still remains much improved from preop. Denies any radicular pain. Up ambulating with minimal assistance looking good.  Afebrile. Vital signs stable. Urine output good. Voiding well. Drain output moderate. Neurologically awake and alert. Oriented and appropriate. Motor and sensory function intact. Dressing clean and dry.  Progressing reasonably well. Continue current management. Still working on discharge plan. Patient may require short-term SNIF unit placement.

## 2013-05-31 MED ORDER — HYDROCODONE-ACETAMINOPHEN 5-325 MG PO TABS: 1.0000 | ORAL_TABLET | Freq: Four times a day (QID) | ORAL | Status: DC | PRN

## 2013-05-31 MED ORDER — DIAZEPAM 5 MG PO TABS: 5.0000 mg | ORAL_TABLET | Freq: Four times a day (QID) | ORAL | Status: DC | PRN

## 2013-05-31 NOTE — Plan of Care (Signed)
Problem: Consults Goal: Diagnosis - Spinal Surgery Outcome: Completed/Met Date Met:  05/31/13 Microdiscectomy     

## 2013-05-31 NOTE — Progress Notes (Deleted)
.  Clinical Social Work Department CLINICAL SOCIAL WORK PLACEMENT NOTE 05/31/2013  Patient:  [name] [name], [name] [name]  Account Number:  [id number] Admit date:  05/28/2013  Clinical Social Worker:  Robin Searing  Date/time:  05/30/2013 03:25 PM  Clinical Social Work is seeking post-discharge placement for this patient at the following level of care:   SKILLED NURSING   (*CSW will update this form in Epic as items are completed)   05/30/2013  Patient/family provided with [facility] Department of Clinical Social Work's list of facilities offering this level of care within the geographic area requested by the patient (or if unable, by the patient's family).  05/30/2013  Patient/family informed of their freedom to choose among providers that offer the needed level of care, that participate in Medicare, Medicaid or managed care program needed by the patient, have an available bed and are willing to accept the patient.  05/30/2013  Patient/family informed of [facility]' ownership interest in [facility], as well as of the fact that they are under no obligation to receive care at this facility.  PASARR submitted to EDS on 05/30/2013 PASARR number received from EDS on 05/30/2013  FL2 transmitted to all facilities in geographic area requested by pt/family on  05/30/2013 FL2 transmitted to all facilities within larger geographic area on   Patient informed that his/her managed care company has contracts with or will negotiate with  certain facilities, including the following:     Patient/family informed of bed offers received:  05/30/2013 Patient chooses bed at [facility] [place] Physician recommends and patient chooses bed at    Patient to be transferred to [facility] [place] on  05/31/2013 Patient to be transferred to facility by ems  The following physician request were entered in Epic:   Additional Comments: Reece Levy, MSW, Theresia Majors [phone]

## 2013-05-31 NOTE — Progress Notes (Signed)
Physical Therapy Treatment Patient Details Name: [name] [name] MRN: [id number] DOB: February 02, 1956 Today's Date: 05/31/2013 Time: 1478-2956 PT Time Calculation (min): 18 min  PT Assessment / Plan / Recommendation Comments on Treatment Session  Pt s/p T11-L4 fusion.  Pt feeling better and with improved mobility today.    Follow Up Recommendations  SNF     Does the patient have the potential to tolerate intense rehabilitation     Barriers to Discharge        Equipment Recommendations  None recommended by PT    Recommendations for Other Services    Frequency Min 5X/week   Plan Discharge plan remains appropriate;Frequency remains appropriate    Precautions / Restrictions Precautions Precautions: Back;Fall Required Braces or Orthoses: Spinal Brace Spinal Brace: Lumbar corset;Applied in standing position;Applied in sitting position   Pertinent Vitals/Pain Back pain. Repositioned.    Mobility  Transfers Sit to Stand: 4: Min guard;With upper extremity assist;From bed Stand to Sit: 4: Min guard;Without upper extremity assist;To bed Ambulation/Gait Ambulation/Gait Assistance: 5: Supervision Ambulation Distance (Feet): 400 Feet Assistive device: Rolling walker Gait Pattern: Step-through pattern;Decreased stride length Gait velocity: decr    Exercises     PT Diagnosis:    PT Problem List:   PT Treatment Interventions:     PT Goals Acute Rehab PT Goals PT Goal: Sit to Stand - Progress: Progressing toward goal PT Goal: Stand to Sit - Progress: Progressing toward goal PT Goal: Ambulate - Progress: Progressing toward goal  Visit Information  Last PT Received On: 05/31/13 Assistance Needed: +1    Subjective Data  Subjective: Pt states she feels better today.   Cognition  Cognition Arousal/Alertness: Awake/alert Behavior During Therapy: WFL for tasks assessed/performed Overall Cognitive Status: Within Functional Limits for tasks assessed    Balance  Static Standing  Balance Static Standing - Balance Support: No upper extremity supported Static Standing - Level of Assistance: 5: Stand by assistance  End of Session PT - End of Session Equipment Utilized During Treatment: Back brace Activity Tolerance: Patient tolerated treatment well Patient left: in bed;with call bell/phone within reach (sitting EOB)   GP     [name] [name] 05/31/2013, 12:12 PM  [facility] PT [phone]

## 2013-05-31 NOTE — Discharge Summary (Signed)
Physician Discharge Summary  Patient ID: SHIRA BOBST MRN: [id number] DOB/AGE: 55-Jul-1957 55 y.o.  Admit date: 05/28/2013 Discharge date: 05/31/2013  Admission Diagnoses:  Discharge Diagnoses:  Principal Problem:   Spinal stenosis, lumbar region, with neurogenic claudication   Discharged Condition: good  Hospital Course: Patient in the hospital where she underwent uncomplicated thoracic lumbar fusion. Postoperative she is doing well. Back and worsening pain improved. Mobilizing well. Ready for discharge to skilled nursing facility.  Consults:   Significant Diagnostic Studies:   Treatments:   Discharge Exam: Blood pressure 109/54, pulse 74, temperature 97.8 F (36.6 C), temperature source Oral, resp. rate 16, height 5' 5.5" (1.664 m), weight 124.875 kg (275 lb 4.8 oz), SpO2 96.00%. Awake and alert. Oriented and appropriate  Nerve function intact. Motor and sensory function intact. Wound clean and dry. Chest and abdomen benign.  Disposition: 01-Home or Self Care     Medication List    TAKE these medications       atenolol 50 MG tablet  Commonly known as:  TENORMIN  Take 25 mg by mouth daily.     cholecalciferol 1000 UNITS tablet  Commonly known as:  VITAMIN D  Take 1,000 Units by mouth daily.     cyclobenzaprine 10 MG tablet  Commonly known as:  FLEXERIL  Take 1 tablet (10 mg total) by mouth 3 (three) times daily.     diazepam 5 MG tablet  Commonly known as:  VALIUM  Take 1-2 tablets (5-10 mg total) by mouth every 6 (six) hours as needed.     glucosamine-chondroitin 500-400 MG tablet  Take 1 tablet by mouth 2 (two) times daily.     HYDROcodone-acetaminophen 5-325 MG per tablet  Commonly known as:  NORCO/VICODIN  Take 1-2 tablets by mouth every 6 (six) hours as needed for pain.     lipase/protease/amylase 40981 UNITS Cpep  Commonly known as:  CREON-12/PANCREASE  Take 2 capsules by mouth 3 (three) times daily before meals.     LORazepam 0.5 MG tablet   Commonly known as:  ATIVAN  Take 1 tablet (0.5 mg total) by mouth every 6 (six) hours as needed for anxiety.     multivitamin with minerals Tabs  Take 1 tablet by mouth daily.     STOOL SOFTENER PO  Take 1 capsule by mouth daily as needed. For constipation           Follow-up Information   Follow up with Andon Villard A, MD. Schedule an appointment as soon as possible for a visit in 2 weeks. (Ask for Lurena Joiner extension [phone])    Contact information:   [address] [place] [address] [phone]       Signed: Carely Nappier A 05/31/2013, 10:48 AM

## 2013-05-31 NOTE — Care Management Note (Signed)
    Page 1 of 1   05/31/2013     3:09:16 PM   CARE MANAGEMENT NOTE 05/31/2013  Patient:  [name] [name], [name] [name]   Account Number:  [id number]  Date Initiated:  05/31/2013  Documentation initiated by:  Elmer Bales  Subjective/Objective Assessment:   Pt admitted for kyphosis, T11-L5 PLIF     Action/Plan:   Will follow for discharge needs   Anticipated DC Date:  05/31/2013   Anticipated DC Plan:  SKILLED NURSING FACILITY      DC Planning Services  CM consult      Choice offered to / List presented to:             Status of service:  Completed, signed off Medicare Important Message given?   (If response is "NO", the following Medicare IM given date fields will be blank) Date Medicare IM given:   Date Additional Medicare IM given:    Discharge Disposition:  SKILLED NURSING FACILITY  Per UR Regulation:  Reviewed for med. necessity/level of care/duration of stay  If discussed at Long Length of Stay Meetings, dates discussed:    Comments:  05/31/13 Elmer Bales RN, MSN CM- Pt discharged to [facility] today.

## 2013-05-31 NOTE — Progress Notes (Signed)
Pt discharged. Report given to  Chad, Charity fundraiser from [facility] center.

## 2013-05-31 NOTE — Progress Notes (Signed)
Clinical Social Work Department CLINICAL SOCIAL WORK PLACEMENT NOTE 05/31/2013  Patient:  [name] [name], [name] [name]  Account Number:  [id number] Admit date:  05/28/2013  Clinical Social Worker:  Robin Searing  Date/time:  05/30/2013 03:25 PM  Clinical Social Work is seeking post-discharge placement for this patient at the following level of care:   SKILLED NURSING   (*CSW will update this form in Epic as items are completed)   05/30/2013  Patient/family provided with [facility] Department of Clinical Social Work's list of facilities offering this level of care within the geographic area requested by the patient (or if unable, by the patient's family).  05/30/2013  Patient/family informed of their freedom to choose among providers that offer the needed level of care, that participate in Medicare, Medicaid or managed care program needed by the patient, have an available bed and are willing to accept the patient.  05/30/2013  Patient/family informed of [facility]' ownership interest in [facility], as well as of the fact that they are under no obligation to receive care at this facility.  PASARR submitted to EDS on 05/30/2013 PASARR number received from EDS on 05/30/2013  FL2 transmitted to all facilities in geographic area requested by pt/family on  05/30/2013 FL2 transmitted to all facilities within larger geographic area on   Patient informed that his/her managed care company has contracts with or will negotiate with  certain facilities, including the following:     Patient/family informed of bed offers received:  05/30/2013 Patient chooses bed at [facility] [place] Physician recommends and patient chooses bed at    [facility] [place] on  05/31/2013 Patient to be transferred to facility by ems  The following physician request were entered in Epic:   Additional Comments: Reece Levy, MSW,  Theresia Majors [phone]

## 2013-05-31 NOTE — Progress Notes (Signed)
Postop day 3. Patient doing reasonably well. Pain controlled. Mobility and stability much improved from preop.  Afebrile with stable vitals. Urine output good. Drain output low. Awake and alert. Oriented and appropriate. Cranial nerve function intact. Motor and sensory function intact. Dressing clean and dry. Chest and abdomen benign.  Overall doing well. Ready for discharge to skilled nursing facility when bed available. Will remove drain and central line today.

## 2013-10-26 ENCOUNTER — Other Ambulatory Visit: Payer: Self-pay | Admitting: Gastroenterology

## 2013-11-15 ENCOUNTER — Encounter (INDEPENDENT_AMBULATORY_CARE_PROVIDER_SITE_OTHER): Payer: Medicare Other | Admitting: General Surgery

## 2014-02-03 ENCOUNTER — Emergency Department (HOSPITAL_COMMUNITY): Payer: Medicare Other

## 2014-02-03 ENCOUNTER — Emergency Department (HOSPITAL_COMMUNITY)
Admission: EM | Admit: 2014-02-03 | Discharge: 2014-02-03 | Disposition: A | Discharge status: Home / Self Care | Payer: Medicare Other | Attending: Emergency Medicine | Admitting: Emergency Medicine

## 2014-02-03 ENCOUNTER — Encounter (HOSPITAL_COMMUNITY): Payer: Self-pay | Admitting: Emergency Medicine

## 2014-02-03 DIAGNOSIS — G43909 Migraine, unspecified, not intractable, without status migrainosus: Secondary | ICD-10-CM | POA: Insufficient documentation

## 2014-02-03 DIAGNOSIS — Z79899 Other long term (current) drug therapy: Secondary | ICD-10-CM | POA: Insufficient documentation

## 2014-02-03 DIAGNOSIS — R1013 Epigastric pain: Secondary | ICD-10-CM | POA: Insufficient documentation

## 2014-02-03 DIAGNOSIS — J45909 Unspecified asthma, uncomplicated: Secondary | ICD-10-CM | POA: Insufficient documentation

## 2014-02-03 DIAGNOSIS — IMO0002 Reserved for concepts with insufficient information to code with codable children: Secondary | ICD-10-CM

## 2014-02-03 DIAGNOSIS — K859 Acute pancreatitis without necrosis or infection, unspecified: Secondary | ICD-10-CM | POA: Insufficient documentation

## 2014-02-03 DIAGNOSIS — I10 Essential (primary) hypertension: Secondary | ICD-10-CM | POA: Insufficient documentation

## 2014-02-03 DIAGNOSIS — R109 Unspecified abdominal pain: Secondary | ICD-10-CM

## 2014-02-03 DIAGNOSIS — R112 Nausea with vomiting, unspecified: Secondary | ICD-10-CM | POA: Insufficient documentation

## 2014-02-03 DIAGNOSIS — R630 Anorexia: Secondary | ICD-10-CM | POA: Insufficient documentation

## 2014-02-03 DIAGNOSIS — E669 Obesity, unspecified: Secondary | ICD-10-CM | POA: Insufficient documentation

## 2014-02-03 DIAGNOSIS — Z87891 Personal history of nicotine dependence: Secondary | ICD-10-CM | POA: Insufficient documentation

## 2014-02-03 DIAGNOSIS — M1710 Unilateral primary osteoarthritis, unspecified knee: Secondary | ICD-10-CM | POA: Insufficient documentation

## 2014-02-03 DIAGNOSIS — Z8701 Personal history of pneumonia (recurrent): Secondary | ICD-10-CM | POA: Insufficient documentation

## 2014-02-03 LAB — CBC WITH DIFFERENTIAL/PLATELET
Basophils Absolute: 0 10*3/uL (ref 0.0–0.1)
Basophils Relative: 0 % (ref 0–1)
EOS ABS: 0 10*3/uL (ref 0.0–0.7)
Eosinophils Relative: 0 % (ref 0–5)
HEMATOCRIT: 46.3 % — AB (ref 36.0–46.0)
HEMOGLOBIN: 16 g/dL — AB (ref 12.0–15.0)
LYMPHS ABS: 0.5 10*3/uL — AB (ref 0.7–4.0)
Lymphocytes Relative: 4 % — ABNORMAL LOW (ref 12–46)
MCH: 30.9 pg (ref 26.0–34.0)
MCHC: 34.6 g/dL (ref 30.0–36.0)
MCV: 89.4 fL (ref 78.0–100.0)
MONOS PCT: 2 % — AB (ref 3–12)
Monocytes Absolute: 0.3 10*3/uL (ref 0.1–1.0)
NEUTROS PCT: 94 % — AB (ref 43–77)
Neutro Abs: 11.5 10*3/uL — ABNORMAL HIGH (ref 1.7–7.7)
Platelets: 204 10*3/uL (ref 150–400)
RBC: 5.18 MIL/uL — ABNORMAL HIGH (ref 3.87–5.11)
RDW: 13.2 % (ref 11.5–15.5)
WBC: 12.3 10*3/uL — ABNORMAL HIGH (ref 4.0–10.5)

## 2014-02-03 LAB — COMPREHENSIVE METABOLIC PANEL
ALBUMIN: 4 g/dL (ref 3.5–5.2)
ALT: 29 U/L (ref 0–35)
AST: 27 U/L (ref 0–37)
Alkaline Phosphatase: 69 U/L (ref 39–117)
BUN: 19 mg/dL (ref 6–23)
CALCIUM: 9.3 mg/dL (ref 8.4–10.5)
CO2: 22 mEq/L (ref 19–32)
Chloride: 98 mEq/L (ref 96–112)
Creatinine, Ser: 0.75 mg/dL (ref 0.50–1.10)
GFR calc Af Amer: >90 mL/min (ref >90)
GFR calc non Af Amer: >90 mL/min (ref >90)
Glucose, Bld: 123 mg/dL — ABNORMAL HIGH (ref 70–99)
Potassium: 3.7 mEq/L (ref 3.7–5.3)
SODIUM: 137 meq/L (ref 137–147)
TOTAL PROTEIN: 8.1 g/dL (ref 6.0–8.3)
Total Bilirubin: 0.3 mg/dL (ref 0.3–1.2)

## 2014-02-03 LAB — POCT I-STAT TROPONIN I: TROPONIN I, POC: 0 ng/mL (ref 0.00–0.08)

## 2014-02-03 LAB — URINE MICROSCOPIC-ADD ON

## 2014-02-03 LAB — URINALYSIS, ROUTINE W REFLEX MICROSCOPIC
Bilirubin Urine: NEGATIVE
GLUCOSE, UA: NEGATIVE mg/dL
Hgb urine dipstick: NEGATIVE
Ketones, ur: NEGATIVE mg/dL
Leukocytes, UA: NEGATIVE
Nitrite: NEGATIVE
Protein, ur: 30 mg/dL — AB
SPECIFIC GRAVITY, URINE: 1.031 — AB (ref 1.005–1.030)
Urobilinogen, UA: 0.2 mg/dL (ref 0.0–1.0)
pH: 5 (ref 5.0–8.0)

## 2014-02-03 LAB — LIPASE, BLOOD: LIPASE: 20 U/L (ref 11–59)

## 2014-02-03 MED ORDER — ONDANSETRON HCL 4 MG PO TABS: 4.0000 mg | ORAL_TABLET | Freq: Four times a day (QID) | ORAL | Status: DC

## 2014-02-03 MED ORDER — IOHEXOL 300 MG/ML  SOLN
100.0000 mL | Freq: Once | INTRAMUSCULAR | Status: AC | PRN
  Administered 2014-02-03: 100 mL via INTRAVENOUS

## 2014-02-03 MED ORDER — HYDROMORPHONE HCL PF 1 MG/ML IJ SOLN
1.0000 mg | INTRAMUSCULAR | Status: DC | PRN
  Administered 2014-02-03 (×2): 1 mg via INTRAVENOUS
  Filled 2014-02-03 (×2): qty 1

## 2014-02-03 MED ORDER — SODIUM CHLORIDE 0.9 % IV SOLN
1000.0000 mL | Freq: Once | INTRAVENOUS | Status: AC
  Administered 2014-02-03: 1000 mL via INTRAVENOUS

## 2014-02-03 MED ORDER — ONDANSETRON 4 MG PO TBDP
4.0000 mg | ORAL_TABLET | Freq: Once | ORAL | Status: AC
  Administered 2014-02-03: 4 mg via ORAL
  Filled 2014-02-03: qty 1

## 2014-02-03 MED ORDER — FENTANYL CITRATE 0.05 MG/ML IJ SOLN: 50.0000 ug | Freq: Once | INTRAMUSCULAR | Status: DC

## 2014-02-03 MED ORDER — SODIUM CHLORIDE 0.9 % IV SOLN
1000.0000 mL | INTRAVENOUS | Status: DC
  Administered 2014-02-03: 1000 mL via INTRAVENOUS

## 2014-02-03 MED ORDER — ONDANSETRON 4 MG PO TBDP
8.0000 mg | ORAL_TABLET | Freq: Once | ORAL | Status: AC
  Administered 2014-02-03: 8 mg via ORAL
  Filled 2014-02-03: qty 2

## 2014-02-03 MED ORDER — ONDANSETRON HCL 4 MG/2ML IJ SOLN
4.0000 mg | Freq: Once | INTRAMUSCULAR | Status: AC
  Administered 2014-02-03: 4 mg via INTRAVENOUS
  Filled 2014-02-03: qty 2

## 2014-02-03 MED ORDER — IOHEXOL 300 MG/ML  SOLN
20.0000 mL | INTRAMUSCULAR | Status: AC
  Administered 2014-02-03: 25 mL via ORAL

## 2014-02-03 NOTE — ED Notes (Signed)
While patient was in the process of being discharged, patient became nauseated and vomited, patient was also incontinent of stool.  Dr. Redgie GrayerMasneri notified, zofran 4mg  ODT ordered, patient still ready for discharge per Dr. Redgie GrayerMasneri.

## 2014-02-03 NOTE — Discharge Instructions (Signed)
Abdominal Pain, Adult °Many things can cause abdominal pain. Usually, abdominal pain is not caused by a disease and will improve without treatment. It can often be observed and treated at home. Your health care provider will do a physical exam and possibly order blood tests and X-rays to help determine the seriousness of your pain. However, in many cases, more time must pass before a clear cause of the pain can be found. Before that point, your health care provider may not know if you need more testing or further treatment. °HOME CARE INSTRUCTIONS  °Monitor your abdominal pain for any changes. The following actions may help to alleviate any discomfort you are experiencing: °· Only take over-the-counter or prescription medicines as directed by your health care provider. °· Do not take laxatives unless directed to do so by your health care provider. °· Try a clear liquid diet (broth, tea, or water) as directed by your health care provider. Slowly move to a bland diet as tolerated. °SEEK MEDICAL CARE IF: °· You have unexplained abdominal pain. °· You have abdominal pain associated with nausea or diarrhea. °· You have pain when you urinate or have a bowel movement. °· You experience abdominal pain that wakes you in the night. °· You have abdominal pain that is worsened or improved by eating food. °· You have abdominal pain that is worsened with eating fatty foods. °SEEK IMMEDIATE MEDICAL CARE IF:  °· Your pain does not go away within 2 hours. °· You have a fever. °· You keep throwing up (vomiting). °· Your pain is felt only in portions of the abdomen, such as the right side or the left lower portion of the abdomen. °· You pass bloody or black tarry stools. °MAKE SURE YOU: °· Understand these instructions.   °· Will watch your condition.   °· Will get help right away if you are not doing well or get worse.   °Document Released: 09/22/2005 Document Revised: 10/03/2013 Document Reviewed: 08/22/2013 °[employer]® Patient  Information ©2014 [employer]. ° °

## 2014-02-03 NOTE — ED Provider Notes (Signed)
Patient excepted in turn over from Dr. Lynelle DoctorKnapp at approximately 1600.   She is pending a CT abdomen and pelvis. Complete history and physical performed by me.  Review of lab work: Urinalysis unremarkable, troponin negative, lipase negative, CMP negative, CBC with mild elevation of white cell count at 12.3.  CT abdomen pelvis:  IMPRESSION: No CT evidence for acute pancreatitis.  Stable intrahepatic and extrahepatic biliary ductal dilatation.  Colonic diverticulosis.  No acute findings in the abdomen or pelvis.   7:02 PM Patient reassessed and feeling better. She has pain medication she can take. She states she can followup with her primary care provider tomorrow. I will provide patient with by mouth Zofran for her nausea and vomiting. Strict ER precautions were given for by mouth intolerance, fever, worsening pain. Patient agrees with this plan.     Darlys Galesavid Masneri, MD 02/03/14 Mikle [id number]

## 2014-02-03 NOTE — ED Notes (Signed)
Dr. Lynelle DoctorKnapp at bedside: Patient thinks pancreas is bothering her, pain is in upper abdomen that radiates to her back, nausea/vomiting that is yellowish denies fever, states doctor is trying to wean her off her creon.

## 2014-02-03 NOTE — ED Notes (Signed)
Patient states she has hx of pancreatitis, nausea and vomiting, abdominal/substernal pain that radiates to her back since yesterday morning.

## 2014-02-03 NOTE — ED Notes (Signed)
Dr. Masneri at bedside. 

## 2014-02-03 NOTE — ED Notes (Signed)
Pt has history of pancreatitis and has been weaning off of Creon.  Pt reports mid upper abdominal pain since yesterday and then started vomiting at 0310.  Pt reports it was green bile

## 2014-02-03 NOTE — ED Notes (Signed)
Patient states she is getting nauseated again
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
## 2014-06-19 ENCOUNTER — Other Ambulatory Visit (HOSPITAL_COMMUNITY): Payer: Self-pay | Admitting: Obstetrics and Gynecology

## 2014-06-19 DIAGNOSIS — K439 Ventral hernia without obstruction or gangrene: Secondary | ICD-10-CM

## 2014-06-21 ENCOUNTER — Ambulatory Visit (HOSPITAL_COMMUNITY)
Admission: RE | Admit: 2014-06-21 | Discharge: 2014-06-21 | Disposition: A | Discharge status: Home / Self Care | Payer: Medicare Other | Source: Ambulatory Visit | Attending: Obstetrics and Gynecology | Admitting: Obstetrics and Gynecology

## 2014-06-21 DIAGNOSIS — K439 Ventral hernia without obstruction or gangrene: Secondary | ICD-10-CM

## 2014-06-21 DIAGNOSIS — I7090 Unspecified atherosclerosis: Secondary | ICD-10-CM | POA: Insufficient documentation

## 2014-06-21 DIAGNOSIS — Z9889 Other specified postprocedural states: Secondary | ICD-10-CM | POA: Insufficient documentation

## 2014-06-21 DIAGNOSIS — R109 Unspecified abdominal pain: Secondary | ICD-10-CM | POA: Insufficient documentation

## 2014-06-21 DIAGNOSIS — K5730 Diverticulosis of large intestine without perforation or abscess without bleeding: Secondary | ICD-10-CM | POA: Insufficient documentation

## 2014-06-21 DIAGNOSIS — K838 Other specified diseases of biliary tract: Secondary | ICD-10-CM | POA: Insufficient documentation

## 2014-06-21 MED ORDER — IOHEXOL 300 MG/ML  SOLN
100.0000 mL | Freq: Once | INTRAMUSCULAR | Status: AC | PRN
Start: 2014-06-21 — End: 2014-06-21
  Administered 2014-06-21: 100 mL via INTRAVENOUS

## 2014-06-24 ENCOUNTER — Ambulatory Visit (INDEPENDENT_AMBULATORY_CARE_PROVIDER_SITE_OTHER): Payer: Medicare Other | Admitting: General Surgery

## 2014-06-24 ENCOUNTER — Encounter (INDEPENDENT_AMBULATORY_CARE_PROVIDER_SITE_OTHER): Payer: Self-pay | Admitting: General Surgery

## 2014-06-24 VITALS — BP 122/74 | HR 75 | Temp 98.0°F | Ht 65.0 in | Wt 297.0 lb

## 2014-06-24 DIAGNOSIS — K432 Incisional hernia without obstruction or gangrene: Secondary | ICD-10-CM

## 2014-06-24 NOTE — Patient Instructions (Signed)
Will refer to bariatric surgeons
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
## 2014-06-27 ENCOUNTER — Encounter (INDEPENDENT_AMBULATORY_CARE_PROVIDER_SITE_OTHER): Payer: Self-pay | Admitting: General Surgery

## 2014-07-18 ENCOUNTER — Encounter (HOSPITAL_COMMUNITY): Payer: Self-pay | Admitting: Pharmacy Technician

## 2014-07-22 ENCOUNTER — Other Ambulatory Visit (HOSPITAL_COMMUNITY): Payer: Self-pay | Admitting: *Deleted

## 2014-07-22 NOTE — Pre-Procedure Instructions (Signed)
[name] [name]  07/22/2014   Your procedure is scheduled on:  Wednesday, July 31, 2014 at 8:30 AM.   [facility]" Admitting Office at 6:30 AM.   Call this number if you have problems the morning of surgery: [phone]   Remember:   Do not eat food or drink liquids after midnight Tuesday, 07/30/14.   Take these medicines the morning of surgery with A SIP OF WATER: atenolol (TENORMIN), HYDROcodone-acetaminophen (NORCO) or traMADol (ULTRAM) - if needed.  Stop vitamins as of tomorrow, Wednesday, 07/24/14.    Do not wear jewelry, make-up or nail polish.  Do not wear lotions, powders, or perfumes. You may wear deodorant.  Do not shave 48 hours prior to surgery.   Do not bring valuables to the hospital.  [facility] not responsible                  for any belongings or valuables.               Contacts, dentures or bridgework may not be worn into surgery.  Leave suitcase in the car. After surgery it may be brought to your room.  For patients admitted to the hospital, discharge time is determined by your                treatment team.           Special Instructions: [place] - Preparing for Surgery  Before surgery, you can play an important role.  Because skin is not sterile, your skin needs to be as free of germs as possible.  You can reduce the number of germs on you skin by washing with CHG (chlorahexidine gluconate) soap before surgery.  CHG is an antiseptic cleaner which kills germs and bonds with the skin to continue killing germs even after washing.  Please DO NOT use if you have an allergy to CHG or antibacterial soaps.  If your skin becomes reddened/irritated stop using the CHG and inform your nurse when you arrive at Short Stay.  Do not shave (including legs and underarms) for at least 48 hours prior to the first CHG shower.  You may shave your face.  Please follow these instructions carefully:   1.  Shower with CHG Soap the night before  surgery and the                                morning of Surgery.  2.  If you choose to wash your hair, wash your hair first as usual with your       normal shampoo.  3.  After you shampoo, rinse your hair and body thoroughly to remove the                      Shampoo.  4.  Use CHG as you would any other liquid soap.  You can apply chg directly       to the skin and wash gently with scrungie or a clean washcloth.  5.  Apply the CHG Soap to your body ONLY FROM THE NECK DOWN.        Do not use on open wounds or open sores.  Avoid contact with your eyes, ears, mouth and genitals (private parts).  Wash genitals (private parts) with your normal soap.  6.  Wash thoroughly, paying special attention to the area where your surgery  will be performed.  7.  Thoroughly rinse your body with warm water from the neck down.  8.  DO NOT shower/wash with your normal soap after using and rinsing off       the CHG Soap.  9.  Pat yourself dry with a clean towel.            10.  Wear clean pajamas.            11.  Place clean sheets on your bed the night of your first shower and do not        sleep with pets.  Day of Surgery  Do not apply any lotions the morning of surgery.  Please wear clean clothes to the hospital/surgery center.     Please read over the following fact sheets that you were given: Pain Booklet, Coughing and Deep Breathing and Surgical Site Infection Prevention

## 2014-07-23 ENCOUNTER — Inpatient Hospital Stay (HOSPITAL_COMMUNITY)
Admission: RE | Admit: 2014-07-23 | Discharge: 2014-07-23 | Disposition: A | Discharge status: Home / Self Care | Payer: Medicare Other | Source: Ambulatory Visit

## 2014-07-24 ENCOUNTER — Encounter (INDEPENDENT_AMBULATORY_CARE_PROVIDER_SITE_OTHER): Payer: Self-pay | Admitting: General Surgery

## 2014-07-26 ENCOUNTER — Ambulatory Visit (HOSPITAL_COMMUNITY)
Admission: RE | Admit: 2014-07-26 | Discharge: 2014-07-26 | Disposition: A | Discharge status: Home / Self Care | Payer: Medicare Other | Source: Ambulatory Visit | Attending: Anesthesiology | Admitting: Anesthesiology

## 2014-07-26 ENCOUNTER — Encounter (HOSPITAL_COMMUNITY)
Admission: RE | Admit: 2014-07-26 | Discharge: 2014-07-26 | Disposition: A | Discharge status: Home / Self Care | Payer: Medicare Other | Source: Ambulatory Visit | Attending: General Surgery | Admitting: General Surgery

## 2014-07-26 ENCOUNTER — Encounter (HOSPITAL_COMMUNITY): Payer: Self-pay

## 2014-07-26 DIAGNOSIS — Z01818 Encounter for other preprocedural examination: Secondary | ICD-10-CM | POA: Insufficient documentation

## 2014-07-26 DIAGNOSIS — K469 Unspecified abdominal hernia without obstruction or gangrene: Secondary | ICD-10-CM | POA: Insufficient documentation

## 2014-07-26 LAB — CBC
HCT: 43 % (ref 36.0–46.0)
HEMOGLOBIN: 13.9 g/dL (ref 12.0–15.0)
MCH: 29 pg (ref 26.0–34.0)
MCHC: 32.3 g/dL (ref 30.0–36.0)
MCV: 89.8 fL (ref 78.0–100.0)
PLATELETS: 190 10*3/uL (ref 150–400)
RBC: 4.79 MIL/uL (ref 3.87–5.11)
RDW: 12.8 % (ref 11.5–15.5)
WBC: 7.9 10*3/uL (ref 4.0–10.5)

## 2014-07-26 LAB — BASIC METABOLIC PANEL
Anion gap: 9 (ref 5–15)
BUN: 17 mg/dL (ref 6–23)
CHLORIDE: 102 meq/L (ref 96–112)
CO2: 31 mEq/L (ref 19–32)
Calcium: 9.5 mg/dL (ref 8.4–10.5)
Creatinine, Ser: 0.81 mg/dL (ref 0.50–1.10)
GFR calc Af Amer: >90 mL/min (ref >90)
GFR, EST NON AFRICAN AMERICAN: 80 mL/min — AB (ref >90)
Glucose, Bld: 121 mg/dL — ABNORMAL HIGH (ref 70–99)
POTASSIUM: 4.5 meq/L (ref 3.7–5.3)
SODIUM: 142 meq/L (ref 137–147)

## 2014-07-26 NOTE — Progress Notes (Signed)
07/26/14 1412  OBSTRUCTIVE SLEEP APNEA  Have you ever been diagnosed with sleep apnea through a sleep study? No  Do you snore loudly (loud enough to be heard through closed doors)?  0  Do you often feel tired, fatigued, or sleepy during the daytime? 1  Has anyone observed you stop breathing during your sleep? 0  Do you have, or are you being treated for high blood pressure? 1  BMI more than 35 kg/m2? 1  Age over 56 years old? 1  Neck circumference greater than 40 cm/16 inches? 1 (17)  Gender: 0  Obstructive Sleep Apnea Score 5  Score 4 or greater  Results sent to PCP

## 2014-07-26 NOTE — Pre-Procedure Instructions (Addendum)
[name] [name]  07/26/2014   Your procedure is scheduled on:  Wednesday, July 31, 2014   [facility] En[facility] "A" Admitting Office at 6:30 AM.   Call this number if you have problems the morning of surgery: [phone]   Remember:   Do not eat food or drink liquids after midnight Tuesday, 07/30/14.   Take these medicines the morning of surgery with A SIP OF WATER: atenolol (TENORMIN), HYDROcodone-acetaminophen (NORCO) or traMADol (ULTRAM) - if needed.  Take all meds as ordered until day of surgery except as instructed below or per dr  Despina AriasSTOP all herbel meds, nsaids (aleve,naproxen,advil,ibuprofen) now including vitamins    Do not wear jewelry, make-up or nail polish.  Do not wear lotions, powders, or perfumes. You may wear deodorant.  Do not shave 48 hours prior to surgery.   Do not bring valuables to the hospital.  [facility] not responsible                  for any belongings or valuables.               Contacts, dentures or bridgework may not be worn into surgery.  Leave suitcase in the car. After surgery it may be brought to your room.  For patients admitted to the hospital, discharge time is determined by your                treatment team.           Special Instructions: [place] - Preparing for Surgery  Before surgery, you can play an important role.  Because skin is not sterile, your skin needs to be as free of germs as possible.  You can reduce the number of germs on you skin by washing with CHG (chlorahexidine gluconate) soap before surgery.  CHG is an antiseptic cleaner which kills germs and bonds with the skin to continue killing germs even after washing.  Please DO NOT use if you have an allergy to CHG or antibacterial soaps.  If your skin becomes reddened/irritated stop using the CHG and inform your nurse when you arrive at Short Stay.  Do not shave (including legs and underarms) for at least 48 hours prior to the first CHG shower.  You may  shave your face.  Please follow these instructions carefully:   1.  Shower with CHG Soap the night before surgery and the                                morning of Surgery.  2.  If you choose to wash your hair, wash your hair first as usual with your       normal shampoo.  3.  After you shampoo, rinse your hair and body thoroughly to remove the                      Shampoo.  4.  Use CHG as you would any other liquid soap.  You can apply chg directly       to the skin and wash gently with scrungie or a clean washcloth.  5.  Apply the CHG Soap to your body ONLY FROM THE NECK DOWN.        Do not use on open wounds or open sores.  Avoid contact with your eyes, ears, mouth and genitals (private parts).  Wash genitals (private parts) with your normal  soap.  6.  Wash thoroughly, paying special attention to the area where your surgery        will be performed.  7.  Thoroughly rinse your body with warm water from the neck down.  8.  DO NOT shower/wash with your normal soap after using and rinsing off       the CHG Soap.  9.  Pat yourself dry with a clean towel.            10.  Wear clean pajamas.            11.  Place clean sheets on your bed the night of your first shower and do not        sleep with pets.  Day of Surgery  Do not apply any lotions the morning of surgery.  Please wear clean clothes to the hospital/surgery center.     Please read over the following fact sheets that you were given: Pain Booklet, Coughing and Deep Breathing and Surgical Site Infection Prevention

## 2014-07-29 NOTE — Progress Notes (Signed)
Chart review: Patient is a 58 year old scheduled for open ventral incisional hernia repair with mesh on 07/31/14 by Dr. Carolynne Edouardoth.   History includes HTN, asthma, migraine headaches, arthritis, acute on chronic pancreatitis 01/2013, prior heavy ETOH but quit in the 80's, HLD, hysterectomy, T&A, cholecystectomy 12/2012, PLIF 05/2013. BMI is consistent with morbid obesity. OSA screening score is 5. PCP is listed as Dr. Windle GuardWilson Elkins.  EKG on 07/26/14 showed normal sinus rhythm. HR has decreased and septal infarct and rightward axis have resolved since 02/03/14 tracing (done during ED visit for abdominal pain with N/V). No CV symptoms documented at her PAT visit.  Preoperative CXR and labs noted.   Further evaluation by her assigned anesthesiologist on the day of surgery. If no acute changes then I would anticipate that she could proceed as planned.  Velna Ochsllison Jashae Wiggs, PA-C [facility] Short Stay Center/Anesthesiology Phone [phone] 07/29/2014 11:28 AM

## 2014-07-30 MED ORDER — CHLORHEXIDINE GLUCONATE 4 % EX LIQD
1.0000 "application " | Freq: Once | CUTANEOUS | Status: DC
  Filled 2014-07-30: qty 15

## 2014-07-30 MED ORDER — CEFAZOLIN SODIUM 10 G IJ SOLR
3.0000 g | INTRAMUSCULAR | Status: AC
  Administered 2014-07-31: 3 g via INTRAVENOUS
  Filled 2014-07-30 (×2): qty 3000

## 2014-07-31 ENCOUNTER — Encounter (HOSPITAL_COMMUNITY): Payer: Medicare Other | Admitting: Vascular Surgery

## 2014-07-31 ENCOUNTER — Inpatient Hospital Stay (HOSPITAL_COMMUNITY): Payer: Medicare Other | Admitting: Certified Registered Nurse Anesthetist

## 2014-07-31 ENCOUNTER — Encounter (HOSPITAL_COMMUNITY): Payer: Self-pay | Admitting: Certified Registered Nurse Anesthetist

## 2014-07-31 ENCOUNTER — Inpatient Hospital Stay (HOSPITAL_COMMUNITY)
Admission: RE | Admit: 2014-07-31 | Discharge: 2014-08-05 | DRG: 354 | Disposition: A | Discharge status: Home / Self Care | Payer: Medicare Other | Source: Ambulatory Visit | Attending: General Surgery | Admitting: General Surgery

## 2014-07-31 ENCOUNTER — Encounter (HOSPITAL_COMMUNITY)
Admission: RE | Disposition: A | Discharge status: Home / Self Care | Payer: Self-pay | Source: Ambulatory Visit | Attending: General Surgery

## 2014-07-31 DIAGNOSIS — M549 Dorsalgia, unspecified: Secondary | ICD-10-CM | POA: Diagnosis not present

## 2014-07-31 DIAGNOSIS — Z803 Family history of malignant neoplasm of breast: Secondary | ICD-10-CM

## 2014-07-31 DIAGNOSIS — E785 Hyperlipidemia, unspecified: Secondary | ICD-10-CM | POA: Diagnosis present

## 2014-07-31 DIAGNOSIS — Z6841 Body Mass Index (BMI) 40.0 and over, adult: Secondary | ICD-10-CM

## 2014-07-31 DIAGNOSIS — M129 Arthropathy, unspecified: Secondary | ICD-10-CM | POA: Diagnosis present

## 2014-07-31 DIAGNOSIS — Z807 Family history of other malignant neoplasms of lymphoid, hematopoietic and related tissues: Secondary | ICD-10-CM | POA: Diagnosis not present

## 2014-07-31 DIAGNOSIS — G43909 Migraine, unspecified, not intractable, without status migrainosus: Secondary | ICD-10-CM | POA: Diagnosis present

## 2014-07-31 DIAGNOSIS — Z87891 Personal history of nicotine dependence: Secondary | ICD-10-CM | POA: Diagnosis not present

## 2014-07-31 DIAGNOSIS — K560 Paralytic ileus: Secondary | ICD-10-CM | POA: Diagnosis not present

## 2014-07-31 DIAGNOSIS — K432 Incisional hernia without obstruction or gangrene: Principal | ICD-10-CM | POA: Diagnosis present

## 2014-07-31 HISTORY — DX: Depression, unspecified: F32.A

## 2014-07-31 HISTORY — PX: VENTRAL HERNIA REPAIR: SHX424

## 2014-07-31 HISTORY — DX: Other chronic pain: G89.29

## 2014-07-31 HISTORY — DX: Major depressive disorder, single episode, unspecified: F32.9

## 2014-07-31 HISTORY — PX: INSERTION OF MESH: SHX5868

## 2014-07-31 HISTORY — DX: Dorsalgia, unspecified: M54.9

## 2014-07-31 SURGERY — REPAIR, HERNIA, VENTRAL
Anesthesia: General | Site: Abdomen

## 2014-07-31 MED ORDER — ONDANSETRON HCL 4 MG/2ML IJ SOLN
INTRAMUSCULAR | Status: DC | PRN
  Administered 2014-07-31: 4 mg via INTRAVENOUS

## 2014-07-31 MED ORDER — HYDROMORPHONE 0.3 MG/ML IV SOLN
INTRAVENOUS | Status: DC
  Administered 2014-07-31: 22:00:00 via INTRAVENOUS
  Administered 2014-08-01: 2.8 mg via INTRAVENOUS
  Administered 2014-08-01: 0.3 mg via INTRAVENOUS
  Administered 2014-08-01: 3.3 mg via INTRAVENOUS
  Administered 2014-08-01: 2.9 mg via INTRAVENOUS
  Administered 2014-08-01: 2.1 mg via INTRAVENOUS
  Administered 2014-08-01: 09:00:00 via INTRAVENOUS
  Administered 2014-08-02: 0.6 mg via INTRAVENOUS
  Filled 2014-07-31 (×2): qty 25

## 2014-07-31 MED ORDER — HYDROMORPHONE HCL PF 1 MG/ML IJ SOLN
0.2500 mg | INTRAMUSCULAR | Status: DC | PRN
  Administered 2014-07-31 (×4): 0.5 mg via INTRAVENOUS

## 2014-07-31 MED ORDER — NALOXONE HCL 0.4 MG/ML IJ SOLN: 0.4000 mg | INTRAMUSCULAR | Status: DC | PRN

## 2014-07-31 MED ORDER — EPHEDRINE SULFATE 50 MG/ML IJ SOLN
INTRAMUSCULAR | Status: DC | PRN
  Administered 2014-07-31 (×4): 10 mg via INTRAVENOUS

## 2014-07-31 MED ORDER — HYDROMORPHONE HCL PF 1 MG/ML IJ SOLN
INTRAMUSCULAR | Status: AC
  Filled 2014-07-31: qty 1

## 2014-07-31 MED ORDER — POVIDONE-IODINE 10 % EX OINT
TOPICAL_OINTMENT | CUTANEOUS | Status: DC | PRN
  Administered 2014-07-31: 1 via TOPICAL

## 2014-07-31 MED ORDER — GLYCOPYRROLATE 0.2 MG/ML IJ SOLN
INTRAMUSCULAR | Status: DC | PRN
  Administered 2014-07-31: .8 mg via INTRAVENOUS

## 2014-07-31 MED ORDER — PROPOFOL 10 MG/ML IV BOLUS
INTRAVENOUS | Status: AC
  Filled 2014-07-31: qty 20

## 2014-07-31 MED ORDER — PANTOPRAZOLE SODIUM 40 MG IV SOLR
40.0000 mg | INTRAVENOUS | Status: DC
  Administered 2014-07-31 – 2014-08-03 (×4): 40 mg via INTRAVENOUS
  Filled 2014-07-31 (×6): qty 40

## 2014-07-31 MED ORDER — OXYCODONE HCL 5 MG/5ML PO SOLN: 5.0000 mg | Freq: Once | ORAL | Status: DC | PRN

## 2014-07-31 MED ORDER — OXYCODONE HCL 5 MG PO TABS: 5.0000 mg | ORAL_TABLET | Freq: Once | ORAL | Status: DC | PRN

## 2014-07-31 MED ORDER — ONDANSETRON HCL 4 MG PO TABS: 4.0000 mg | ORAL_TABLET | Freq: Four times a day (QID) | ORAL | Status: DC | PRN

## 2014-07-31 MED ORDER — LACTATED RINGERS IV SOLN
INTRAVENOUS | Status: DC
  Administered 2014-07-31 (×2): via INTRAVENOUS

## 2014-07-31 MED ORDER — FENTANYL CITRATE 0.05 MG/ML IJ SOLN
INTRAMUSCULAR | Status: AC
  Filled 2014-07-31: qty 5

## 2014-07-31 MED ORDER — MORPHINE SULFATE 4 MG/ML IJ SOLN
INTRAMUSCULAR | Status: AC
  Filled 2014-07-31: qty 1

## 2014-07-31 MED ORDER — PROMETHAZINE HCL 25 MG/ML IJ SOLN
INTRAMUSCULAR | Status: AC
  Filled 2014-07-31: qty 1

## 2014-07-31 MED ORDER — PROPOFOL 10 MG/ML IV BOLUS
INTRAVENOUS | Status: DC | PRN
  Administered 2014-07-31: 160 mg via INTRAVENOUS

## 2014-07-31 MED ORDER — PHENYLEPHRINE HCL 10 MG/ML IJ SOLN
INTRAMUSCULAR | Status: DC | PRN
  Administered 2014-07-31: 40 ug via INTRAVENOUS
  Administered 2014-07-31: 80 ug via INTRAVENOUS
  Administered 2014-07-31 (×2): 40 ug via INTRAVENOUS
  Administered 2014-07-31 (×2): 80 ug via INTRAVENOUS
  Administered 2014-07-31: 40 ug via INTRAVENOUS

## 2014-07-31 MED ORDER — FENTANYL CITRATE 0.05 MG/ML IJ SOLN
INTRAMUSCULAR | Status: DC | PRN
  Administered 2014-07-31 (×4): 50 ug via INTRAVENOUS
  Administered 2014-07-31: 100 ug via INTRAVENOUS
  Administered 2014-07-31: 50 ug via INTRAVENOUS

## 2014-07-31 MED ORDER — POVIDONE-IODINE 10 % EX OINT
TOPICAL_OINTMENT | CUTANEOUS | Status: AC
  Filled 2014-07-31: qty 28.35

## 2014-07-31 MED ORDER — SODIUM CHLORIDE 0.9 % IJ SOLN
9.0000 mL | INTRAMUSCULAR | Status: DC | PRN
Start: 2014-07-31 — End: 2014-08-02

## 2014-07-31 MED ORDER — SUCCINYLCHOLINE CHLORIDE 20 MG/ML IJ SOLN
INTRAMUSCULAR | Status: DC | PRN
  Administered 2014-07-31: 100 mg via INTRAVENOUS

## 2014-07-31 MED ORDER — ONDANSETRON HCL 4 MG/2ML IJ SOLN
4.0000 mg | Freq: Four times a day (QID) | INTRAMUSCULAR | Status: DC | PRN
  Administered 2014-08-02 (×2): 4 mg via INTRAVENOUS
  Filled 2014-07-31 (×3): qty 2

## 2014-07-31 MED ORDER — ROCURONIUM BROMIDE 100 MG/10ML IV SOLN
INTRAVENOUS | Status: DC | PRN
  Administered 2014-07-31: 10 mg via INTRAVENOUS
  Administered 2014-07-31 (×2): 20 mg via INTRAVENOUS
  Administered 2014-07-31: 30 mg via INTRAVENOUS
  Administered 2014-07-31: 10 mg via INTRAVENOUS

## 2014-07-31 MED ORDER — PROMETHAZINE HCL 25 MG/ML IJ SOLN
6.2500 mg | INTRAMUSCULAR | Status: DC | PRN
  Administered 2014-07-31: 12.5 mg via INTRAVENOUS

## 2014-07-31 MED ORDER — MORPHINE SULFATE 4 MG/ML IJ SOLN
4.0000 mg | INTRAMUSCULAR | Status: DC | PRN
  Administered 2014-07-31 – 2014-08-04 (×7): 4 mg via INTRAVENOUS
  Filled 2014-07-31 (×6): qty 1

## 2014-07-31 MED ORDER — ONDANSETRON HCL 4 MG/2ML IJ SOLN
4.0000 mg | Freq: Four times a day (QID) | INTRAMUSCULAR | Status: DC | PRN
  Administered 2014-07-31: 4 mg via INTRAVENOUS

## 2014-07-31 MED ORDER — MIDAZOLAM HCL 2 MG/2ML IJ SOLN
INTRAMUSCULAR | Status: AC
  Filled 2014-07-31: qty 2

## 2014-07-31 MED ORDER — DEXAMETHASONE SODIUM PHOSPHATE 4 MG/ML IJ SOLN
INTRAMUSCULAR | Status: DC | PRN
  Administered 2014-07-31: 8 mg via INTRAVENOUS

## 2014-07-31 MED ORDER — LIDOCAINE HCL (CARDIAC) 20 MG/ML IV SOLN
INTRAVENOUS | Status: DC | PRN
  Administered 2014-07-31: 80 mg via INTRAVENOUS

## 2014-07-31 MED ORDER — HEPARIN SODIUM (PORCINE) 5000 UNIT/ML IJ SOLN
5000.0000 [IU] | Freq: Three times a day (TID) | INTRAMUSCULAR | Status: DC
  Administered 2014-08-01 – 2014-08-05 (×13): 5000 [IU] via SUBCUTANEOUS
  Filled 2014-07-31 (×18): qty 1

## 2014-07-31 MED ORDER — KCL IN DEXTROSE-NACL 20-5-0.9 MEQ/L-%-% IV SOLN
INTRAVENOUS | Status: DC
  Administered 2014-07-31 – 2014-08-03 (×6): via INTRAVENOUS
  Filled 2014-07-31 (×10): qty 1000

## 2014-07-31 MED ORDER — NEOSTIGMINE METHYLSULFATE 10 MG/10ML IV SOLN
INTRAVENOUS | Status: DC | PRN
  Administered 2014-07-31: 5 mg via INTRAVENOUS

## 2014-07-31 MED ORDER — 0.9 % SODIUM CHLORIDE (POUR BTL) OPTIME
TOPICAL | Status: DC | PRN
  Administered 2014-07-31 (×2): 1000 mL

## 2014-07-31 SURGICAL SUPPLY — 52 items
BINDER ABD UNIV 10 28-50 (GAUZE/BANDAGES/DRESSINGS) IMPLANT
BINDER ABDOM UNIV 10 (GAUZE/BANDAGES/DRESSINGS) ×3
BLADE SURG ROTATE 9660 (MISCELLANEOUS) ×2 IMPLANT
CANISTER SUCTION 2500CC (MISCELLANEOUS) ×3 IMPLANT
CHLORAPREP W/TINT 26ML (MISCELLANEOUS) ×3 IMPLANT
COVER SURGICAL LIGHT HANDLE (MISCELLANEOUS) ×3 IMPLANT
DRAIN CHANNEL 19F RND (DRAIN) ×4 IMPLANT
DRAPE LAPAROSCOPIC ABDOMINAL (DRAPES) ×3 IMPLANT
DRAPE UTILITY 15X26 W/TAPE STR (DRAPE) ×6 IMPLANT
ELECT BLADE 4.0 EZ CLEAN MEGAD (MISCELLANEOUS) ×3
ELECT CAUTERY BLADE 6.4 (BLADE) ×3 IMPLANT
ELECT REM PT RETURN 9FT ADLT (ELECTROSURGICAL) ×3
ELECTRODE BLDE 4.0 EZ CLN MEGD (MISCELLANEOUS) IMPLANT
ELECTRODE REM PT RTRN 9FT ADLT (ELECTROSURGICAL) ×1 IMPLANT
GLOVE BIO SURGEON STRL SZ7.5 (GLOVE) ×3 IMPLANT
GLOVE BIOGEL PI IND STRL 7.0 (GLOVE) IMPLANT
GLOVE BIOGEL PI INDICATOR 7.0 (GLOVE) ×6
GLOVE EUDERMIC 7 POWDERFREE (GLOVE) ×2 IMPLANT
GLOVE SURG SS PI 7.0 STRL IVOR (GLOVE) ×2 IMPLANT
GOWN STRL REUS W/ TWL LRG LVL3 (GOWN DISPOSABLE) ×3 IMPLANT
GOWN STRL REUS W/ TWL XL LVL3 (GOWN DISPOSABLE) IMPLANT
GOWN STRL REUS W/TWL LRG LVL3 (GOWN DISPOSABLE) ×6
GOWN STRL REUS W/TWL XL LVL3 (GOWN DISPOSABLE) ×3
KIT BASIN OR (CUSTOM PROCEDURE TRAY) ×3 IMPLANT
KIT ROOM TURNOVER OR (KITS) ×3 IMPLANT
MARKER SKIN DUAL TIP RULER LAB (MISCELLANEOUS) ×2 IMPLANT
MESH VENTRALIGHT ST 10X13IN (Mesh General) ×2 IMPLANT
NS IRRIG 1000ML POUR BTL (IV SOLUTION) ×5 IMPLANT
PACK GENERAL/GYN (CUSTOM PROCEDURE TRAY) ×3 IMPLANT
PAD ABD 8X10 STRL (GAUZE/BANDAGES/DRESSINGS) ×4 IMPLANT
PAD ARMBOARD 7.5X6 YLW CONV (MISCELLANEOUS) ×3 IMPLANT
RETAINER VISCERA MED (MISCELLANEOUS) ×2 IMPLANT
SPONGE GAUZE 4X4 12PLY STER LF (GAUZE/BANDAGES/DRESSINGS) ×2 IMPLANT
SPONGE LAP 18X18 X RAY DECT (DISPOSABLE) ×4 IMPLANT
STAPLER VISISTAT 35W (STAPLE) ×2 IMPLANT
SUT ETHILON 2 0 FS 18 (SUTURE) ×4 IMPLANT
SUT NOVA 1 T20/GS 25DT (SUTURE) ×16 IMPLANT
SUT NOVA NAB DX-16 0-1 5-0 T12 (SUTURE) IMPLANT
SUT PROLENE 1 CT (SUTURE) IMPLANT
SUT SILK 2 0 (SUTURE) ×3
SUT SILK 2 0 SH CR/8 (SUTURE) ×2 IMPLANT
SUT SILK 2-0 18XBRD TIE 12 (SUTURE) ×1 IMPLANT
SUT VIC AB 3-0 54X BRD REEL (SUTURE) IMPLANT
SUT VIC AB 3-0 BRD 54 (SUTURE)
SUT VIC AB 3-0 SH 27 (SUTURE)
SUT VIC AB 3-0 SH 27XBRD (SUTURE) IMPLANT
TAPE CLOTH SURG 4X10 WHT LF (GAUZE/BANDAGES/DRESSINGS) ×2 IMPLANT
TAPE CLOTH SURG 6X10 WHT LF (GAUZE/BANDAGES/DRESSINGS) ×2 IMPLANT
TOWEL OR 17X24 6PK STRL BLUE (TOWEL DISPOSABLE) ×1 IMPLANT
TOWEL OR 17X26 10 PK STRL BLUE (TOWEL DISPOSABLE) ×3 IMPLANT
TRAY FOLEY CATH 14FRSI W/METER (CATHETERS) IMPLANT
TRAY FOLEY CATH 16FRSI W/METER (SET/KITS/TRAYS/PACK) ×2 IMPLANT

## 2014-07-31 NOTE — Interval H&P Note (Signed)
History and Physical Interval Note:  07/31/2014 7:37 AM  [name] AldermanAnna M Higginbotham  has presented today for surgery, with the diagnosis of hernia  The various methods of treatment have been discussed with the patient and family. After consideration of risks, benefits and other options for treatment, the patient has consented to  Procedure(s): HERNIA REPAIR VENTRAL INCISIONAL ADULT (N/A) INSERTION OF MESH (N/A) as a surgical intervention .  The patient's history has been reviewed, patient examined, no change in status, stable for surgery.  I have reviewed the patient's chart and labs.  Questions were answered to the patient's satisfaction.     TOTH III,Shantanique Hodo S

## 2014-07-31 NOTE — Anesthesia Postprocedure Evaluation (Signed)
  Anesthesia Post-op Note  Patient: [name] [name]  Procedure(s) Performed: Procedure(s): HERNIA REPAIR VENTRAL INCISIONAL ADULT (N/A) INSERTION OF MESH (N/A)  Patient Location: PACU  Anesthesia Type:General  Level of Consciousness: awake and alert   Airway and Oxygen Therapy: Patient Spontanous Breathing  Post-op Pain: mild  Post-op Assessment: Post-op Vital signs reviewed  Post-op Vital Signs: stable  Last Vitals:  Filed Vitals:   07/31/14 1245  BP:   Pulse: 78  Temp:   Resp: 12    Complications: No apparent anesthesia complications

## 2014-07-31 NOTE — Transfer of Care (Signed)
Immediate Anesthesia Transfer of Care Note  Patient: [name] [name]  Procedure(s) Performed: Procedure(s): HERNIA REPAIR VENTRAL INCISIONAL ADULT (N/A) INSERTION OF MESH (N/A)  Patient Location: PACU  Anesthesia Type:General  Level of Consciousness: awake, alert , oriented, patient cooperative and responds to stimulation  Airway & Oxygen Therapy: Patient Spontanous Breathing and Patient connected to face mask oxygen  Post-op Assessment: Report given to PACU RN, Post -op Vital signs reviewed and stable and Patient moving all extremities X 4  Post vital signs: Reviewed and stable  Complications: No apparent anesthesia complications

## 2014-07-31 NOTE — Op Note (Signed)
07/31/2014  11:25 AM  PATIENT:  [name] [name]  58 y.o. female  PRE-OPERATIVE DIAGNOSIS:  large ventral incisional hernia  POST-OPERATIVE DIAGNOSIS:  large ventral incisional hernia  PROCEDURE:  Procedure(s): HERNIA REPAIR VENTRAL INCISIONAL ADULT (N/A) INSERTION OF MESH (N/A)  SURGEON:  Surgeon(s) and Role:    * Robyne AskewPaul S Toth III, MD - Primary    * Ernestene MentionHaywood M Ingram, MD - Assisting  PHYSICIAN ASSISTANT:   ASSISTANTS: Dr. Derrell LollingIngram    ANESTHESIA:   general  EBL:  Total I/O In: 2500 [I.V.:2500] Out: 300 [Urine:300]  BLOOD ADMINISTERED:none  DRAINS: (2) Jackson-Pratt drain(s) with closed bulb suction in the subq   LOCAL MEDICATIONS USED:  NONE  SPECIMEN:  No Specimen  DISPOSITION OF SPECIMEN:  N/A  COUNTS:  YES  TOURNIQUET:  * No tourniquets in log *  DICTATION: .Dragon Dictation After informed consent was obtained the patient was brought to the operating room and placed in the supine position on the operating room table. After adequate induction of general anesthesia the patient's abdomen was prepped with ChloraPrep, allowed to dry, and draped in usual sterile manner. A midline incision was made with a 10 blade knife. This incision was carried to the skin and subcutaneous tissue sharply with the electrocautery until the linea alba was identified. The linea alba was incised with the electrocautery and the preperitoneal space was probed bluntly with a hemostat until the peritoneum was opened and access was gained to the abdominal cavity. The rest of the incision was opened under direct vision with the electrocautery. There was a large hernia encountered below the umbilicus. The omentum and small bowel and colon that were in the hernia were easily reduced out of the hernia. There were no adhesions to the hernia sac except for one small area of omentum that was taken down sharply with the electrocautery. The hernia sac was excised sharply with the electrocautery. The fascia appeared to  be intact above the umbilicus but was fairly retracted below the umbilicus. We were able to identify the rectus muscles which were intact. Superiorly we were able to dissect the subcutaneous tissue off the anterior abdominal wall fascia in order to mobilize the fascia. Inferiorly we dissected the peritoneum off the backside of the abdominal wall muscle to create a space for the mesh. We then dissected the subcutaneous tissue off the rectus muscle anterior to the muscle. Next a 33 cm piece of ventral light mesh was chosen. #1 Novafil U. Stitches were placed through the fascia and muscle then through the mesh and then out through the muscle and fascial layer again. This was done circumferentially around the hernia defect. The bowel was protected by a fish as well as a large malleable retractor. Once this was accomplished the mesh appeared to be in good position. The area was then irrigated with copious amounts of saline. Care was taken to make sure there were no gaps in the mesh and that it nicely appose the abdominal wall with good overlap. Next the muscle and fascia and some scar tissue were reapproximated over top of the mesh with interrupted #1 Novafil figure-of-eight stitches. The subcutaneous tissue was then again irrigated With copious amounts of saline. A small stab incision was made in the lower abdomen on each side. A tonsil clamp was placed through each of these incisions and used to bring a 19 JamaicaFrench round Blake drain into the operative bed. The drains were anchored to the skin with a 3-0 nylon stitch. The subcutaneous tissue  was then closed with a running 2-0 Vicryl stitch. The skin was then closed with staples. Sterile dressings were applied. The patient tolerated the procedure well. At the end of the case all needle sponge and instrument counts were correct. The patient was then awakened and taken to recovery in stable condition.  PLAN OF CARE: Admit to inpatient   PATIENT DISPOSITION:  PACU -  hemodynamically stable.   Delay start of Pharmacological VTE agent (>24hrs) due to surgical blood loss or risk of bleeding: no

## 2014-07-31 NOTE — Anesthesia Preprocedure Evaluation (Signed)
Anesthesia Evaluation  Patient identified by MRN, date of birth, ID band Patient awake    Reviewed: Allergy & Precautions, H&P , NPO status , Patient's Chart, lab work & pertinent test results  Airway       Dental   Pulmonary shortness of breath, asthma , former smoker,    + decreased breath sounds      Cardiovascular hypertension, Rhythm:Regular Rate:Normal     Neuro/Psych    GI/Hepatic   Endo/Other  Morbid obesityHas gained nearly 30 lbs since surgery last year  Renal/GU      Musculoskeletal   Abdominal   Peds  Hematology   Anesthesia Other Findings   Reproductive/Obstetrics                           Anesthesia Physical Anesthesia Plan  ASA: III  Anesthesia Plan: General   Post-op Pain Management:    Induction: Intravenous  Airway Management Planned: Oral ETT  Additional Equipment:   Intra-op Plan:   Post-operative Plan: Possible Post-op intubation/ventilation and Extubation in OR  Informed Consent: I have reviewed the patients History and Physical, chart, labs and discussed the procedure including the risks, benefits and alternatives for the proposed anesthesia with the patient or authorized representative who has indicated his/her understanding and acceptance.   Dental advisory given  Plan Discussed with:   Anesthesia Plan Comments:         Anesthesia Quick Evaluation

## 2014-07-31 NOTE — Progress Notes (Signed)
Patient brought up from PACU. Received report from Richardo PriestMark Brande, RN. Patient lying in bed stable.
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
## 2014-08-01 ENCOUNTER — Encounter (HOSPITAL_COMMUNITY): Payer: Self-pay | Admitting: General Surgery

## 2014-08-01 ENCOUNTER — Inpatient Hospital Stay (HOSPITAL_COMMUNITY): Payer: Medicare Other

## 2014-08-01 LAB — CBC
HEMATOCRIT: 42.7 % (ref 36.0–46.0)
Hemoglobin: 13.9 g/dL (ref 12.0–15.0)
MCH: 29.5 pg (ref 26.0–34.0)
MCHC: 32.6 g/dL (ref 30.0–36.0)
MCV: 90.7 fL (ref 78.0–100.0)
PLATELETS: 209 10*3/uL (ref 150–400)
RBC: 4.71 MIL/uL (ref 3.87–5.11)
RDW: 13.3 % (ref 11.5–15.5)
WBC: 14.5 10*3/uL — ABNORMAL HIGH (ref 4.0–10.5)

## 2014-08-01 LAB — BASIC METABOLIC PANEL
Anion gap: 11 (ref 5–15)
BUN: 12 mg/dL (ref 6–23)
CO2: 27 mEq/L (ref 19–32)
CREATININE: 0.66 mg/dL (ref 0.50–1.10)
Calcium: 8.7 mg/dL (ref 8.4–10.5)
Chloride: 103 mEq/L (ref 96–112)
GFR calc Af Amer: >90 mL/min (ref >90)
Glucose, Bld: 130 mg/dL — ABNORMAL HIGH (ref 70–99)
Potassium: 4.7 mEq/L (ref 3.7–5.3)
Sodium: 141 mEq/L (ref 137–147)

## 2014-08-01 MED ORDER — MORPHINE SULFATE (PF) 1 MG/ML IV SOLN: INTRAVENOUS | Status: DC

## 2014-08-01 MED ORDER — ACETAMINOPHEN 325 MG PO TABS
650.0000 mg | ORAL_TABLET | Freq: Four times a day (QID) | ORAL | Status: DC | PRN
  Administered 2014-08-01 – 2014-08-04 (×5): 650 mg via ORAL
  Filled 2014-08-01 (×5): qty 2

## 2014-08-01 MED ORDER — ALBUTEROL SULFATE (2.5 MG/3ML) 0.083% IN NEBU
2.5000 mg | INHALATION_SOLUTION | RESPIRATORY_TRACT | Status: DC | PRN
  Administered 2014-08-03 – 2014-08-05 (×2): 2.5 mg via RESPIRATORY_TRACT
  Filled 2014-08-01 (×2): qty 3

## 2014-08-01 MED ORDER — ACETAMINOPHEN 10 MG/ML IV SOLN
1000.0000 mg | Freq: Once | INTRAVENOUS | Status: AC
  Administered 2014-08-01: 1000 mg via INTRAVENOUS
  Filled 2014-08-01: qty 100

## 2014-08-01 MED ORDER — NALOXONE HCL 0.4 MG/ML IJ SOLN: 0.4000 mg | INTRAMUSCULAR | Status: DC | PRN

## 2014-08-01 MED ORDER — ONDANSETRON HCL 4 MG/2ML IJ SOLN: 4.0000 mg | Freq: Four times a day (QID) | INTRAMUSCULAR | Status: DC | PRN

## 2014-08-01 MED ORDER — IOHEXOL 300 MG/ML  SOLN
50.0000 mL | Freq: Once | INTRAMUSCULAR | Status: AC | PRN
  Administered 2014-08-01: 20 mL via INTRAVENOUS

## 2014-08-01 MED ORDER — DIPHENHYDRAMINE HCL 50 MG/ML IJ SOLN: 12.5000 mg | Freq: Four times a day (QID) | INTRAMUSCULAR | Status: DC | PRN

## 2014-08-01 MED ORDER — SODIUM CHLORIDE 0.9 % IJ SOLN: 9.0000 mL | INTRAMUSCULAR | Status: DC | PRN

## 2014-08-01 MED ORDER — DIPHENHYDRAMINE HCL 12.5 MG/5ML PO ELIX: 12.5000 mg | ORAL_SOLUTION | Freq: Four times a day (QID) | ORAL | Status: DC | PRN

## 2014-08-01 MED ORDER — CHLORHEXIDINE GLUCONATE 4 % EX LIQD
CUTANEOUS | Status: AC
  Filled 2014-08-01: qty 15

## 2014-08-01 NOTE — Progress Notes (Signed)
Attempted PICC insertion in right Cephalic vein.  Unable to advance 15cm of PICC catheter into the SVC.  Possible occlusion or stenosis?  IR is aware and will attempt to reposition PICC possibly today.  PICC left in place for IR to exchange  Staff RN aware

## 2014-08-01 NOTE — Progress Notes (Signed)
1 Day Post-Op  Subjective: Still having a lot of pain but doing better on pca  Objective: Vital signs in last 24 hours: Temp:  [97.5 F (36.4 C)-98.5 F (36.9 C)] 98.1 F (36.7 C) (08/06 0624) Pulse Rate:  [70-84] 84 (08/06 0624) Resp:  [12-18] 18 (08/06 0624) BP: (103-126)/(54-72) 120/69 mmHg (08/06 0624) SpO2:  [92 %-97 %] 92 % (08/06 0624) Last BM Date: 07/31/14  Intake/Output from previous day: 08/05 0701 - 08/06 0700 In: 2530 [P.O.:30; I.V.:2500] Out: 2485 [Urine:1850; Emesis/NG output:300; Drains:260; Blood:75] Intake/Output this shift: Total I/O In: -  Out: 1345 [Urine:1000; Emesis/NG output:250; Drains:95]  Resp: clear to auscultation bilaterally Cardio: regular rate and rhythm GI: soft, appropriately tender. drains and incisions look good  Lab Results:   Recent Labs  08/01/14 0602  WBC 14.5*  HGB 13.9  HCT 42.7  PLT 209   BMET No results found for this basename: NA, K, CL, CO2, GLUCOSE, BUN, CREATININE, CALCIUM,  in the last 72 hours PT/INR No results found for this basename: LABPROT, INR,  in the last 72 hours ABG No results found for this basename: PHART, PCO2, PO2, HCO3,  in the last 72 hours  Studies/Results: No results found.  Anti-infectives: Anti-infectives   Start     Dose/Rate Route Frequency Ordered Stop   07/31/14 0600  ceFAZolin (ANCEF) 3 g in dextrose 5 % 50 mL IVPB     3 g 160 mL/hr over 30 Minutes Intravenous On call to O.R. 07/30/14 1510 07/31/14 0825      Assessment/Plan: s/p Procedure(s): HERNIA REPAIR VENTRAL INCISIONAL ADULT (N/A) INSERTION OF MESH (N/A) Continue ng until nausea resolves PCA OOB Keep foley another day Place PICC  LOS: 1 day    TOTH III,Tyshia Fenter S 08/01/2014

## 2014-08-01 NOTE — Progress Notes (Signed)
RN called about pt having SOB and low SpO2. Upon arrival to room, pt being adjusted in bed, appears very uncomfortable, having trouble catching her breath with SpO2 of 88% on 6L [place]. Pt on PCA pump at this time to monitor ETCO2. Once pt was adjusted in bed, pt explained she has "nasal congestion and can't breathe". [place] turned off to see if flow was effecting pt's SOB due to NG tube in place as well. Pt SpO2 came up to 91% and with coaching was able to calm her breathing down. Pt states she is in a lot of pain and nauseated. RN made aware. Pt placed back on 2L [place] and tolerating well for now. BBS are clear, diminished at the bases, however pt is morbidly obese. Albuterol treatment is not necessary at this time, however, will order PRN incase pt develops worsening SOB or wheezing.

## 2014-08-01 NOTE — Progress Notes (Signed)
Peripherally Inserted Central Catheter/Midline Placement  The IV Nurse has discussed with the patient and/or persons authorized to consent for the patient, the purpose of this procedure and the potential benefits and risks involved with this procedure.  The benefits include less needle sticks, lab draws from the catheter and patient may be discharged home with the catheter.  Risks include, but not limited to, infection, bleeding, blood clot (thrombus formation), and puncture of an artery; nerve damage and irregular heat beat.  Alternatives to this procedure were also discussed.  Consent obtained by Merleen Millinerhea Duncan, RN CRNI  PICC/Midline Placement Documentation        [name] [name], [name] [name] 08/01/2014, 1:38 PM

## 2014-08-01 NOTE — Procedures (Signed)
R cephalic PICC revision attempted, would not advance central to cephalic arch. New DL PICC into R basilic vein, would not advance through narrow and irregular axillary vein (prior DVT?), so left as MIDLINE catheter. No complication No blood loss. See complete dictation in [facility] P[facility].

## 2014-08-01 NOTE — Progress Notes (Signed)
Spoke with Dr. Andrey CampanileWilson on call for Dr. Carolynne Edouardoth and explained the situation with the PICC being unable to be advanced centrally.  Discussed options of having IR attempt to advance centrally or IV team could exchange line for a midline catheter.  Dr. Andrey CampanileWilson would like to have radiology attempt to advance the line centrally.  He is currently in OR and unable to enter order therefore I will enter it for him. Fantasia Jinkins, Lajean ManesKerry Loraine, RN [facility] team

## 2014-08-02 MED ORDER — DIPHENHYDRAMINE HCL 12.5 MG/5ML PO ELIX: 12.5000 mg | ORAL_SOLUTION | Freq: Four times a day (QID) | ORAL | Status: DC | PRN

## 2014-08-02 MED ORDER — SODIUM CHLORIDE 0.9 % IJ SOLN: 9.0000 mL | INTRAMUSCULAR | Status: DC | PRN

## 2014-08-02 MED ORDER — ONDANSETRON HCL 4 MG/2ML IJ SOLN: 4.0000 mg | Freq: Four times a day (QID) | INTRAMUSCULAR | Status: DC | PRN

## 2014-08-02 MED ORDER — NALOXONE HCL 0.4 MG/ML IJ SOLN: 0.4000 mg | INTRAMUSCULAR | Status: DC | PRN

## 2014-08-02 MED ORDER — DIPHENHYDRAMINE HCL 50 MG/ML IJ SOLN: 12.5000 mg | Freq: Four times a day (QID) | INTRAMUSCULAR | Status: DC | PRN

## 2014-08-02 MED ORDER — MORPHINE SULFATE (PF) 1 MG/ML IV SOLN
INTRAVENOUS | Status: DC
  Administered 2014-08-02: 01:00:00 via INTRAVENOUS
  Administered 2014-08-02: 25.44 mg via INTRAVENOUS
  Administered 2014-08-02: 26.94 mg via INTRAVENOUS
  Administered 2014-08-02: 09:00:00 via INTRAVENOUS
  Administered 2014-08-02: 12 mg via INTRAVENOUS
  Administered 2014-08-02: 14:00:00 via INTRAVENOUS
  Administered 2014-08-03: 1.5 mg via INTRAVENOUS
  Administered 2014-08-03: 21:00:00 via INTRAVENOUS
  Administered 2014-08-03: 3 mg via INTRAVENOUS
  Administered 2014-08-03: 17.3 mg via INTRAVENOUS
  Administered 2014-08-03 (×2): 7.5 mg via INTRAVENOUS
  Administered 2014-08-03: 12:00:00 via INTRAVENOUS
  Administered 2014-08-04: 1.5 mg via INTRAVENOUS
  Filled 2014-08-02 (×6): qty 25

## 2014-08-02 NOTE — Progress Notes (Signed)
Utilization review completed. Nandi Tonnesen, RN, BSN. 

## 2014-08-02 NOTE — Progress Notes (Signed)
2 Days Post-Op  Subjective: Complains more of back pain than abdominal pain. Sitting in chair this am  Objective: Vital signs in last 24 hours: Temp:  [97.7 F (36.5 C)-99 F (37.2 C)] 99 F (37.2 C) (08/07 0639) Pulse Rate:  [81-99] 99 (08/07 0639) Resp:  [11-25] 22 (08/07 0639) BP: (105-147)/(58-80) 146/80 mmHg (08/07 0639) SpO2:  [90 %-95 %] 95 % (08/07 0639) Last BM Date: 07/31/14  Intake/Output from previous day: 08/06 0701 - 08/07 0700 In: 4186 [I.V.:4186] Out: 3390 [Urine:2450; Emesis/NG output:600; Drains:340] Intake/Output this shift:    Resp: clear to auscultation bilaterally Cardio: regular rate and rhythm GI: soft, appropriately tender. drain output serosang. good bs  Lab Results:   Recent Labs  08/01/14 0602  WBC 14.5*  HGB 13.9  HCT 42.7  PLT 209   BMET  Recent Labs  08/01/14 0602  NA 141  K 4.7  CL 103  CO2 27  GLUCOSE 130*  BUN 12  CREATININE 0.66  CALCIUM 8.7   PT/INR No results found for this basename: LABPROT, INR,  in the last 72 hours ABG No results found for this basename: PHART, PCO2, PO2, HCO3,  in the last 72 hours  Studies/Results: No results found.  Anti-infectives: Anti-infectives   Start     Dose/Rate Route Frequency Ordered Stop   07/31/14 0600  ceFAZolin (ANCEF) 3 g in dextrose 5 % 50 mL IVPB     3 g 160 mL/hr over 30 Minutes Intravenous On call to O.R. 07/30/14 1510 07/31/14 0825      Assessment/Plan: s/p Procedure(s): HERNIA REPAIR VENTRAL INCISIONAL ADULT (N/A) INSERTION OF MESH (N/A) Advance diet. Start clears today D/c ng D/c foley Decrease IVF ambulate  LOS: 2 days    TOTH III,PAUL S 08/02/2014

## 2014-08-03 NOTE — Progress Notes (Signed)
Orthopedic Tech Progress Note Patient Details:  [name] [name] 07/08/1956 [id number] Replacement binder. Ortho Devices Type of Ortho Device: Abdominal binder   [name], Nikkie Liming Craig 08/03/2014, 9:49 AM

## 2014-08-03 NOTE — Progress Notes (Signed)
3 Days Post-Op  Subjective: DOING OK STILL PRETTY SORE  Objective: Vital signs in last 24 hours: Temp:  [98.2 F (36.8 C)-99 F (37.2 C)] 98.2 F (36.8 C) (08/08 0602) Pulse Rate:  [82-112] 82 (08/08 0602) Resp:  [16-20] 19 (08/08 0602) BP: (134-151)/(69-79) 151/77 mmHg (08/08 0602) SpO2:  [35 %-100 %] 94 % (08/08 0800) Last BM Date: 07/31/14  Intake/Output from previous day: 08/07 0701 - 08/08 0700 In: 1155.8 [P.O.:720; I.V.:275.8] Out: 2270 [Urine:1800; Drains:470] Intake/Output this shift:    Incision/Wound:C/D/I obese abdomen but soft  Lab Results:   Recent Labs  08/01/14 0602  WBC 14.5*  HGB 13.9  HCT 42.7  PLT 209   BMET  Recent Labs  08/01/14 0602  NA 141  K 4.7  CL 103  CO2 27  GLUCOSE 130*  BUN 12  CREATININE 0.66  CALCIUM 8.7   PT/INR No results found for this basename: LABPROT, INR,  in the last 72 hours ABG No results found for this basename: PHART, PCO2, PO2, HCO3,  in the last 72 hours  Studies/Results: Ir Fluoro Guide Cv Line Right  08/02/2014   CLINICAL DATA:  Poor venous access. PICC line could not be advanced centrally using bedside technique.  EXAM: MIDLINE CATHETER REVISION ULTRASOUND AND FLUOROSCOPY  FLUOROSCOPY TIME:  6 min 42 seconds  TECHNIQUE: After written informed consent was obtained, patient was placed in the supine position on angiographic table. The previously placed right arm PICC catheter was prepped using maximum barrier technique including cap and mask, sterile gown, sterile gloves, large sterile sheet, and Chlorhexidine as cutaneous antisepsis.  Under fluoroscopy, the tip of the catheter was noted the just proximal to the axilla have the cephalic vein. Catheter was cut and exchanged over know when a guidewire for a peel-away sheath. With some manipulation, the catheter was negotiated past the cephalic arch into the SVC. However, a double-lumen 5 French PICC catheter trimmed to the appropriate length would not advance  centrally. Venography demonstrates some narrowing but patency of the cephalic arch. Ultimately, a new approach was needed.  Skin overlying the right basilic vein was infiltrated locally with 1% lidocaine. Under real-time ultrasound guidance, the right basilic vein was accessed with a 21 gauge micropuncture needle; the needle tip within the vein was confirmed with ultrasound image documentation. Needle exchanged over a 018 guidewire for a peel-away sheath. Again the guidewire passed only with significant difficulty beyond the axillary vein level to the SVC. The 5 French catheter would not advance beyond the axillary vein. Venography showed marked narrowing and irregularity of the axillary vein, patent centrally. Because the patient's IV needs per her RN could be adequately met with a midline catheter, the catheter was cut short at 28 cm and positioned with its tip in the axillary vein as a MIDLINE catheter. Spot chest radiograph confirms appropriate catheter position. Catheter was flushed per protocol and secured externally . The patient tolerated procedure well, with no immediate complication.  IMPRESSION: 1. Narrowing and irregularity in the cephalic arch and right axillary vein preventing central advancement of the 5 French catheter as detailed above. 2. 96PR right basilic MIDLINE catheter placed, OK for routine use.   Electronically Signed   By: Arne Cleveland M.D.   On: 08/02/2014 08:20   Ir US Guide Vasc Access Right  08/02/2014   CLINICAL DATA:  Poor venous access. PICC line could not be advanced centrally using bedside technique.  EXAM: MIDLINE CATHETER REVISION ULTRASOUND AND FLUOROSCOPY  FLUOROSCOPY TIME:  6  min 42 seconds  TECHNIQUE: After written informed consent was obtained, patient was placed in the supine position on angiographic table. The previously placed right arm PICC catheter was prepped using maximum barrier technique including cap and mask, sterile gown, sterile gloves, large sterile sheet,  and Chlorhexidine as cutaneous antisepsis.  Under fluoroscopy, the tip of the catheter was noted the just proximal to the axilla have the cephalic vein. Catheter was cut and exchanged over know when a guidewire for a peel-away sheath. With some manipulation, the catheter was negotiated past the cephalic arch into the SVC. However, a double-lumen 5 French PICC catheter trimmed to the appropriate length would not advance centrally. Venography demonstrates some narrowing but patency of the cephalic arch. Ultimately, a new approach was needed.  Skin overlying the right basilic vein was infiltrated locally with 1% lidocaine. Under real-time ultrasound guidance, the right basilic vein was accessed with a 21 gauge micropuncture needle; the needle tip within the vein was confirmed with ultrasound image documentation. Needle exchanged over a 018 guidewire for a peel-away sheath. Again the guidewire passed only with significant difficulty beyond the axillary vein level to the SVC. The 5 French catheter would not advance beyond the axillary vein. Venography showed marked narrowing and irregularity of the axillary vein, patent centrally. Because the patient's IV needs per her RN could be adequately met with a midline catheter, the catheter was cut short at 28 cm and positioned with its tip in the axillary vein as a MIDLINE catheter. Spot chest radiograph confirms appropriate catheter position. Catheter was flushed per protocol and secured externally . The patient tolerated procedure well, with no immediate complication.  IMPRESSION: 1. Narrowing and irregularity in the cephalic arch and right axillary vein preventing central advancement of the 5 French catheter as detailed above. 2. 59DG right basilic MIDLINE catheter placed, OK for routine use.   Electronically Signed   By: Arne Cleveland M.D.   On: 08/02/2014 08:20    Anti-infectives: Anti-infectives   Start     Dose/Rate Route Frequency Ordered Stop   07/31/14 0600   ceFAZolin (ANCEF) 3 g in dextrose 5 % 50 mL IVPB     3 g 160 mL/hr over 30 Minutes Intravenous On call to O.R. 07/30/14 1510 07/31/14 0825      Assessment/Plan: s/p Procedure(s): HERNIA REPAIR VENTRAL INCISIONAL ADULT (N/A) INSERTION OF MESH (N/A) CUT DOWN IVF ADVANCE DIET OOB  LOS: 3 days    Korver Graybeal A. 08/03/2014

## 2014-08-04 MED ORDER — SODIUM CHLORIDE 0.9 % IJ SOLN: 10.0000 mL | INTRAMUSCULAR | Status: DC | PRN

## 2014-08-04 MED ORDER — OXYCODONE-ACETAMINOPHEN 5-325 MG PO TABS
1.0000 | ORAL_TABLET | ORAL | Status: DC | PRN
  Administered 2014-08-04 – 2014-08-05 (×6): 2 via ORAL
  Filled 2014-08-04 (×6): qty 2

## 2014-08-04 MED ORDER — HYDROMORPHONE HCL PF 1 MG/ML IJ SOLN: 1.0000 mg | INTRAMUSCULAR | Status: DC | PRN

## 2014-08-04 MED ORDER — PANTOPRAZOLE SODIUM 40 MG PO TBEC
40.0000 mg | DELAYED_RELEASE_TABLET | Freq: Every day | ORAL | Status: DC
  Administered 2014-08-04 – 2014-08-05 (×2): 40 mg via ORAL
  Filled 2014-08-04 (×2): qty 1

## 2014-08-04 NOTE — Progress Notes (Signed)
4 Days Post-Op  Subjective: Pt doing well.   Pain control good.   Objective: Vital signs in last 24 hours: Temp:  [98 F (36.7 C)-99.4 F (37.4 C)] 98 F (36.7 C) (08/09 0551) Pulse Rate:  [81-121] 81 (08/09 0551) Resp:  [15-25] 21 (08/09 0800) BP: (117-171)/(68-89) 123/68 mmHg (08/09 0551) SpO2:  [95 %-100 %] 97 % (08/09 0800) Last BM Date: 07/31/14 (pre op)  Intake/Output from previous day: 08/08 0701 - 08/09 0700 In: 1657 [P.O.:720; I.V.:937] Out: 745 [Urine:250; Drains:495] Intake/Output this shift:    Incision/Wound:wound clean JP serous    Binder  In place  Lab Results:  No results found for this basename: WBC, HGB, HCT, PLT,  in the last 72 hours BMET No results found for this basename: NA, K, CL, CO2, GLUCOSE, BUN, CREATININE, CALCIUM,  in the last 72 hours PT/INR No results found for this basename: LABPROT, INR,  in the last 72 hours ABG No results found for this basename: PHART, PCO2, PO2, HCO3,  in the last 72 hours  Studies/Results: No results found.  Anti-infectives: Anti-infectives   Start     Dose/Rate Route Frequency Ordered Stop   07/31/14 0600  ceFAZolin (ANCEF) 3 g in dextrose 5 % 50 mL IVPB     3 g 160 mL/hr over 30 Minutes Intravenous On call to O.R. 07/30/14 1510 07/31/14 0825      Assessment/Plan: s/p Procedure(s): HERNIA REPAIR VENTRAL INCISIONAL ADULT (N/A) INSERTION OF MESH (N/A) D/C PCA   OOB Home monday  LOS: 4 days    Resean Brander A. 08/04/2014

## 2014-08-05 NOTE — Progress Notes (Signed)
5 Days Post-Op  Subjective: No complaints  Objective: Vital signs in last 24 hours: Temp:  [97.9 F (36.6 C)-98.4 F (36.9 C)] 97.9 F (36.6 C) (08/10 0508) Pulse Rate:  [67-91] 67 (08/10 0508) Resp:  [20-22] 22 (08/10 0508) BP: (116-139)/(61-72) 135/72 mmHg (08/10 0508) SpO2:  [94 %-97 %] 96 % (08/10 0615) Last BM Date: 07/31/14  Intake/Output from previous day: 08/09 0701 - 08/10 0700 In: 840 [P.O.:840] Out: 465 [Drains:465] Intake/Output this shift:    Resp: clear to auscultation bilaterally Cardio: regular rate and rhythm GI: soft, minimal tenderness. incision looks good  Lab Results:  No results found for this basename: WBC, HGB, HCT, PLT,  in the last 72 hours BMET No results found for this basename: NA, K, CL, CO2, GLUCOSE, BUN, CREATININE, CALCIUM,  in the last 72 hours PT/INR No results found for this basename: LABPROT, INR,  in the last 72 hours ABG No results found for this basename: PHART, PCO2, PO2, HCO3,  in the last 72 hours  Studies/Results: No results found.  Anti-infectives: Anti-infectives   Start     Dose/Rate Route Frequency Ordered Stop   07/31/14 0600  ceFAZolin (ANCEF) 3 g in dextrose 5 % 50 mL IVPB     3 g 160 mL/hr over 30 Minutes Intravenous On call to O.R. 07/30/14 1510 07/31/14 0825      Assessment/Plan: s/p Procedure(s): HERNIA REPAIR VENTRAL INCISIONAL ADULT (N/A) INSERTION OF MESH (N/A) Discharge D/c right jp. Leave left jp in place  LOS: 5 days    TOTH III,PAUL S 08/05/2014
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
## 2014-08-08 ENCOUNTER — Telehealth (INDEPENDENT_AMBULATORY_CARE_PROVIDER_SITE_OTHER): Payer: Self-pay

## 2014-08-08 NOTE — Telephone Encounter (Signed)
Called pt with po appt.  

## 2014-08-08 NOTE — Telephone Encounter (Signed)
Message copied by Brennan BaileyBROOKS, Kendell Gammon on Thu Aug 08, 2014  3:57 PM ------      Message from: Louie CasaBARAJAS, RUTH      Created: Thu Aug 08, 2014  3:14 PM      Regarding: Dr. Geryl Rankinsoth/1st po hernia      Contact: [phone]       Patient had hernia repair on 07/31/14 and states Dr. Carolynne Edouardoth told her needs to coming next week Monday or Tuesday, please could you sched appt.            Thank you. ------

## 2014-08-12 ENCOUNTER — Ambulatory Visit (INDEPENDENT_AMBULATORY_CARE_PROVIDER_SITE_OTHER): Payer: Medicare Other | Admitting: General Surgery

## 2014-08-12 ENCOUNTER — Encounter (INDEPENDENT_AMBULATORY_CARE_PROVIDER_SITE_OTHER): Payer: Self-pay | Admitting: General Surgery

## 2014-08-12 VITALS — BP 130/84 | HR 82 | Resp 20 | Ht 64.0 in | Wt 296.6 lb

## 2014-08-12 DIAGNOSIS — K432 Incisional hernia without obstruction or gangrene: Secondary | ICD-10-CM

## 2014-08-12 NOTE — Patient Instructions (Signed)
No heavy lifting May shower on tuesday

## 2014-08-13 NOTE — Discharge Summary (Signed)
Physician Discharge Summary  Patient ID: [name] [name] MRN: [id number] DOB/AGE: 02-11-1956 58 y.o.  Admit date: 07/31/2014 Discharge date: 08/13/2014  Admission Diagnoses:  Discharge Diagnoses:  Active Problems:   Ventral incisional hernia   Discharged Condition: good  Hospital Course: The patient underwent a large open ventral hernia repair with mesh. Postoperatively she had an ileus for several days which gradually resolved. Once this resolved and we were able to advance her diet and she was ready for discharge home. She had 1 of 2 JP drains removed prior to discharge. She was instructed on the tear of the remaining drain  Consults: None  Significant Diagnostic Studies: none  Treatments: surgery: as above  Discharge Exam: Blood pressure 135/72, pulse 67, temperature 97.9 F (36.6 C), temperature source Oral, resp. rate 22, height 5' 4.5" (1.638 m), weight 297 lb 4 oz (134.832 kg), SpO2 96.00%. Resp: clear to auscultation bilaterally Cardio: regular rate and rhythm GI: soft, appropriate tenderness. incision looks good  Disposition: 01-Home or Self Care  Discharge Instructions   Call MD for:  difficulty breathing, headache or visual disturbances    Complete by:  As directed      Call MD for:  extreme fatigue    Complete by:  As directed      Call MD for:  hives    Complete by:  As directed      Call MD for:  persistant dizziness or light-headedness    Complete by:  As directed      Call MD for:  persistant nausea and vomiting    Complete by:  As directed      Call MD for:  redness, tenderness, or signs of infection (pain, swelling, redness, odor or green/yellow discharge around incision site)    Complete by:  As directed      Call MD for:  severe uncontrolled pain    Complete by:  As directed      Call MD for:  temperature >100.4    Complete by:  As directed      Diet - low sodium heart healthy    Complete by:  As directed      Discharge instructions    Complete by:   As directed   No heavy lifting. Diet as tolerated. Sponge bathe while drain is in     Increase activity slowly    Complete by:  As directed      No wound care    Complete by:  As directed             Medication List         atenolol 50 MG tablet  Commonly known as:  TENORMIN  Take 25 mg by mouth daily.     cholecalciferol 1000 UNITS tablet  Commonly known as:  VITAMIN D  Take 1,000 Units by mouth daily.     FIBER PO  Take 1 capsule by mouth every 6 (six) hours as needed (constipation).     HYDROcodone-acetaminophen 10-325 MG per tablet  Commonly known as:  NORCO  Take 0.5-1 tablets by mouth every 6 (six) hours as needed (pain).     lipase/protease/amylase 40981 UNITS Cpep capsule  Commonly known as:  CREON  Take 1 capsule by mouth daily.     multivitamin with minerals Tabs tablet  Take 1 tablet by mouth daily.     traMADol 50 MG tablet  Commonly known as:  ULTRAM  Take 50-100 mg by mouth every 4 (four) hours as needed (  pain).         Signed: TOTH III,PAUL S 08/13/2014, 9:38 AM
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
## 2014-08-21 ENCOUNTER — Telehealth (INDEPENDENT_AMBULATORY_CARE_PROVIDER_SITE_OTHER): Payer: Self-pay

## 2014-08-21 NOTE — Telephone Encounter (Signed)
Pt s/p hernia repair on 07/31/14 by Dr Carolynne Edouard. Pt states that last couple days she has had increased pain when she is up moving around. Pt states that she has started taking the hydrocodone again at this time. Pt denies any fevers, chills, redness, swelling at the incision area. Pt denies any n/v. Advised pt to scale back on her activities and increase as tolerated, and she can apply heat/ice to the area for pain relief as well. Advised pt that she can take Tylenol or Ibuprofen as needed for pain relief as well. Pt has a po appt scheduled to see Dr Carolynne Edouard in the am, she states that she will keep that appt. Advised pt that if pain becomes worse or if she develops any other symptoms to give our office a call back. Pt verbalized understanding.

## 2014-08-22 ENCOUNTER — Encounter (INDEPENDENT_AMBULATORY_CARE_PROVIDER_SITE_OTHER): Payer: Self-pay | Admitting: General Surgery

## 2014-08-22 ENCOUNTER — Ambulatory Visit (INDEPENDENT_AMBULATORY_CARE_PROVIDER_SITE_OTHER): Payer: Medicare Other | Admitting: General Surgery

## 2014-08-22 VITALS — BP 138/86 | HR 84 | Resp 14 | Ht 64.0 in | Wt 298.8 lb

## 2014-08-22 DIAGNOSIS — K432 Incisional hernia without obstruction or gangrene: Secondary | ICD-10-CM

## 2014-08-22 NOTE — Progress Notes (Signed)
Subjective:     Patient ID: [name] [name], female   DOB: [birth year], 58 y.o.   MRN: [id number]  HPI The patient is a 58 year old white female who is about 3 weeks status post large open ventral hernia repair with mesh. Her main complaint is that she hurts on the right side of her abdomen when she coughs or eats. She denies any nausea or vomiting. Her appetite is improving and her bowels are moving normally. She denies any fevers or chills.  Review of Systems     Objective:   Physical Exam On exam her midline incision is healing nicely with no sign of infection or seroma. Her abdominal wall feels very solid with no palpable evidence of recurrence of the hernia    Assessment:     The patient is 3 weeks status post large open ventral hernia repair with mesh     Plan:     At this point I think she is having normal postoperative soreness. I would like her to refrain from any heavy lifting. I will plan to see her back in one month to check her progress

## 2014-08-22 NOTE — Patient Instructions (Signed)
No heavy lifting 

## 2014-08-24 ENCOUNTER — Emergency Department (HOSPITAL_COMMUNITY)
Admission: EM | Admit: 2014-08-24 | Discharge: 2014-08-24 | Disposition: A | Discharge status: Home / Self Care | Payer: Medicare Other | Attending: Emergency Medicine | Admitting: Emergency Medicine

## 2014-08-24 ENCOUNTER — Emergency Department (HOSPITAL_COMMUNITY): Payer: Medicare Other

## 2014-08-24 ENCOUNTER — Encounter (HOSPITAL_COMMUNITY): Payer: Self-pay | Admitting: Emergency Medicine

## 2014-08-24 DIAGNOSIS — I809 Phlebitis and thrombophlebitis of unspecified site: Secondary | ICD-10-CM

## 2014-08-24 DIAGNOSIS — I8000 Phlebitis and thrombophlebitis of superficial vessels of unspecified lower extremity: Secondary | ICD-10-CM | POA: Insufficient documentation

## 2014-08-24 DIAGNOSIS — R229 Localized swelling, mass and lump, unspecified: Secondary | ICD-10-CM | POA: Diagnosis present

## 2014-08-24 DIAGNOSIS — J45909 Unspecified asthma, uncomplicated: Secondary | ICD-10-CM | POA: Insufficient documentation

## 2014-08-24 DIAGNOSIS — Z87891 Personal history of nicotine dependence: Secondary | ICD-10-CM | POA: Insufficient documentation

## 2014-08-24 DIAGNOSIS — Z8659 Personal history of other mental and behavioral disorders: Secondary | ICD-10-CM | POA: Insufficient documentation

## 2014-08-24 DIAGNOSIS — I10 Essential (primary) hypertension: Secondary | ICD-10-CM | POA: Insufficient documentation

## 2014-08-24 DIAGNOSIS — M79609 Pain in unspecified limb: Secondary | ICD-10-CM

## 2014-08-24 DIAGNOSIS — Z8701 Personal history of pneumonia (recurrent): Secondary | ICD-10-CM | POA: Diagnosis not present

## 2014-08-24 DIAGNOSIS — Z79899 Other long term (current) drug therapy: Secondary | ICD-10-CM | POA: Diagnosis not present

## 2014-08-24 DIAGNOSIS — G8929 Other chronic pain: Secondary | ICD-10-CM | POA: Insufficient documentation

## 2014-08-24 LAB — BASIC METABOLIC PANEL
Anion gap: 9 (ref 5–15)
BUN: 13 mg/dL (ref 6–23)
CO2: 30 meq/L (ref 19–32)
CREATININE: 0.73 mg/dL (ref 0.50–1.10)
Calcium: 9.7 mg/dL (ref 8.4–10.5)
Chloride: 102 mEq/L (ref 96–112)
GFR calc non Af Amer: >90 mL/min (ref >90)
GLUCOSE: 97 mg/dL (ref 70–99)
Potassium: 4.8 mEq/L (ref 3.7–5.3)
Sodium: 141 mEq/L (ref 137–147)

## 2014-08-24 LAB — CBC
HCT: 39.6 % (ref 36.0–46.0)
HEMOGLOBIN: 13.2 g/dL (ref 12.0–15.0)
MCH: 29.7 pg (ref 26.0–34.0)
MCHC: 33.3 g/dL (ref 30.0–36.0)
MCV: 89.2 fL (ref 78.0–100.0)
Platelets: 197 10*3/uL (ref 150–400)
RBC: 4.44 MIL/uL (ref 3.87–5.11)
RDW: 13 % (ref 11.5–15.5)
WBC: 7.7 10*3/uL (ref 4.0–10.5)

## 2014-08-24 MED ORDER — FENTANYL CITRATE 0.05 MG/ML IJ SOLN
50.0000 ug | Freq: Once | INTRAMUSCULAR | Status: AC
  Administered 2014-08-24: 50 ug via INTRAVENOUS
  Filled 2014-08-24: qty 2

## 2014-08-24 MED ORDER — HYDROCODONE-ACETAMINOPHEN 5-325 MG PO TABS: 1.0000 | ORAL_TABLET | Freq: Four times a day (QID) | ORAL | Status: DC | PRN

## 2014-08-24 NOTE — Discharge Instructions (Signed)
Phlebitis °Phlebitis is soreness and swelling (inflammation) of a vein. This can occur in your arms, legs, or torso (trunk), as well as deeper inside your body. Phlebitis is usually not serious when it occurs close to the surface of the body. However, it can cause serious problems when it occurs in a vein deeper inside the body. °CAUSES  °Phlebitis can be triggered by various things, including:  °· Reduced blood flow through your veins. This can happen with: °¨ Bed rest over a long period. °¨ Long-distance travel. °¨ Injury. °¨ Surgery. °¨ Being overweight (obese) or pregnant. °· Having an IV tube put in the vein and getting certain medicines through the vein. °· Cancer and cancer treatment. °· Use of illegal drugs taken through the vein. °· Inflammatory diseases. °· Inherited (genetic) diseases that increase the risk of blood clots. °· Hormone therapy, such as birth control pills. °SIGNS AND SYMPTOMS  °· Red, tender, swollen, and painful area on your skin. Usually, the area will be long and narrow. °· Firmness along the center of the affected area. This can indicate that a blood clot has formed. °· Low-grade fever. °DIAGNOSIS  °A health care provider can usually diagnose phlebitis by examining the affected area and asking about your symptoms. To check for infection or blood clots, your health care provider may order blood tests or an ultrasound exam of the area. Blood tests and your family history may also indicate if you have an underlying genetic disease that causes blood clots. Occasionally, a piece of tissue is taken from the body (biopsy sample) if an unusual cause of phlebitis is suspected. °TREATMENT  °Treatment will vary depending on the severity of the condition and the area of the body affected. Treatment may include: °· Use of a warm compress or heating pad. °· Use of compression stockings or bandages. °· Anti-inflammatory medicines. °· Removal of any IV tube that may be causing the problem. °· Medicines  that kill germs (antibiotics) if an infection is present. °· Blood-thinning medicines if a blood clot is suspected or present. °· In rare cases, surgery may be needed to remove damaged sections of vein. °HOME CARE INSTRUCTIONS  °· Only take over-the-counter or prescription medicines as directed by your health care provider. Take all medicines exactly as prescribed. °· Raise (elevate) the affected area above the level of your heart as directed by your health care provider. °· Apply a warm compress or heating pad to the affected area as directed by your health care provider. Do not sleep with the heating pad. °· Use compression stockings or bandages as directed. These will speed healing and prevent the condition from coming back. °· If you are on blood thinners: °¨ Get follow-up blood tests as directed by your health care provider. °¨ Check with your health care provider before using any new medicines. °¨ Carry a medical alert card or wear your medical alert jewelry to show that you are on blood thinners. °· For phlebitis in the legs: °¨ Avoid prolonged standing or bed rest. °¨ Keep your legs moving. Raise your legs when sitting or lying. °· Do not smoke. °· Women, particularly those over the age of 35, should consider the risks and benefits of taking the contraceptive pill. This kind of hormone treatment can increase your risk for blood clots. °· Follow up with your health care provider as directed. °SEEK MEDICAL CARE IF:  °· You have unusual bruising or any bleeding problems. °· Your swelling or pain in the affected area   is not improving. °· You are on anti-inflammatory medicine, and you develop belly (abdominal) pain. °SEEK IMMEDIATE MEDICAL CARE IF:  °· You have a sudden onset of chest pain or difficulty breathing. °· You have a fever or persistent symptoms for more than 2-3 days. °· You have a fever and your symptoms suddenly get worse. °MAKE SURE YOU: °· Understand these instructions. °· Will watch your  condition. °· Will get help right away if you are not doing well or get worse. °Document Released: 12/07/2001 Document Revised: 10/03/2013 Document Reviewed: 08/20/2013 °[employer]® Patient Information ©2015 [employer]. This information is not intended to replace advice given to you by your health care provider. Make sure you discuss any questions you have with your health care provider. ° °
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
## 2014-08-24 NOTE — Progress Notes (Signed)
VASCULAR LAB PRELIMINARY  PRELIMINARY  PRELIMINARY  PRELIMINARY  Right upper extremity venous Doppers completed.    Preliminary report:  There is no DVT noted in the right upper extremity.  There is superficial thrombosis noted in the right chest at site of knot.  [name] [name], RVT 08/24/2014, 6:28 PM

## 2014-08-24 NOTE — ED Notes (Signed)
Pt reports Right arm PICC line removed 08/05/2014. Pt presents with swelling and tenderness at clavicle area, reports x 3 days.

## 2014-09-20 ENCOUNTER — Encounter (INDEPENDENT_AMBULATORY_CARE_PROVIDER_SITE_OTHER): Payer: Medicare Other | Admitting: General Surgery

## 2014-10-11 ENCOUNTER — Other Ambulatory Visit: Payer: Self-pay

## 2015-01-16 ENCOUNTER — Other Ambulatory Visit: Payer: Self-pay | Admitting: Family Medicine

## 2015-01-16 DIAGNOSIS — R52 Pain, unspecified: Secondary | ICD-10-CM

## 2015-02-23 ENCOUNTER — Other Ambulatory Visit: Payer: Self-pay

## 2015-03-15 ENCOUNTER — Other Ambulatory Visit: Payer: Self-pay

## 2015-04-05 ENCOUNTER — Other Ambulatory Visit: Payer: Self-pay

## 2015-06-27 ENCOUNTER — Other Ambulatory Visit: Payer: Self-pay | Admitting: Gastroenterology

## 2015-06-27 DIAGNOSIS — R1084 Generalized abdominal pain: Secondary | ICD-10-CM

## 2015-07-02 ENCOUNTER — Ambulatory Visit
Admission: RE | Admit: 2015-07-02 | Discharge: 2015-07-02 | Disposition: A | Discharge status: Home / Self Care | Payer: Medicare Other | Source: Ambulatory Visit | Attending: Gastroenterology | Admitting: Gastroenterology

## 2015-07-02 DIAGNOSIS — R1084 Generalized abdominal pain: Secondary | ICD-10-CM

## 2015-07-02 MED ORDER — IOPAMIDOL (ISOVUE-300) INJECTION 61%
150.0000 mL | Freq: Once | INTRAVENOUS | Status: AC | PRN
  Administered 2015-07-02: 150 mL via INTRAVENOUS

## 2017-03-03 ENCOUNTER — Other Ambulatory Visit: Payer: Self-pay | Admitting: Family Medicine

## 2017-03-03 DIAGNOSIS — M79601 Pain in right arm: Secondary | ICD-10-CM

## 2017-03-04 ENCOUNTER — Ambulatory Visit
Admission: RE | Admit: 2017-03-04 | Discharge: 2017-03-04 | Disposition: A | Discharge status: Home / Self Care | Payer: Medicare Other | Source: Ambulatory Visit | Attending: Family Medicine | Admitting: Family Medicine

## 2017-03-04 DIAGNOSIS — M79601 Pain in right arm: Secondary | ICD-10-CM

## 2018-01-11 ENCOUNTER — Ambulatory Visit: Payer: Medicare Other | Admitting: Neurology

## 2018-04-10 ENCOUNTER — Ambulatory Visit: Payer: Medicare Other | Admitting: Physician Assistant
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
## 2018-04-19 ENCOUNTER — Ambulatory Visit (INDEPENDENT_AMBULATORY_CARE_PROVIDER_SITE_OTHER): Payer: Medicare Other | Admitting: Ophthalmology

## 2018-04-19 ENCOUNTER — Encounter (INDEPENDENT_AMBULATORY_CARE_PROVIDER_SITE_OTHER): Payer: Self-pay | Admitting: Ophthalmology

## 2018-04-19 DIAGNOSIS — H3581 Retinal edema: Secondary | ICD-10-CM

## 2018-04-19 DIAGNOSIS — H353112 Nonexudative age-related macular degeneration, right eye, intermediate dry stage: Secondary | ICD-10-CM

## 2018-04-19 DIAGNOSIS — H18012 Anterior corneal pigmentations, left eye: Secondary | ICD-10-CM

## 2018-04-19 DIAGNOSIS — H25813 Combined forms of age-related cataract, bilateral: Secondary | ICD-10-CM

## 2018-04-20 ENCOUNTER — Encounter (INDEPENDENT_AMBULATORY_CARE_PROVIDER_SITE_OTHER): Payer: Self-pay | Admitting: Ophthalmology
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
## 2018-06-20 ENCOUNTER — Encounter (INDEPENDENT_AMBULATORY_CARE_PROVIDER_SITE_OTHER): Payer: Self-pay | Admitting: Ophthalmology

## 2018-06-20 ENCOUNTER — Ambulatory Visit (INDEPENDENT_AMBULATORY_CARE_PROVIDER_SITE_OTHER): Payer: Medicare Other | Admitting: Ophthalmology

## 2018-06-20 DIAGNOSIS — H18012 Anterior corneal pigmentations, left eye: Secondary | ICD-10-CM | POA: Diagnosis not present

## 2018-06-20 DIAGNOSIS — H25813 Combined forms of age-related cataract, bilateral: Secondary | ICD-10-CM

## 2018-06-20 DIAGNOSIS — H3581 Retinal edema: Secondary | ICD-10-CM | POA: Diagnosis not present

## 2018-06-20 DIAGNOSIS — H353112 Nonexudative age-related macular degeneration, right eye, intermediate dry stage: Secondary | ICD-10-CM | POA: Diagnosis not present

## 2018-06-22 ENCOUNTER — Encounter (INDEPENDENT_AMBULATORY_CARE_PROVIDER_SITE_OTHER): Payer: Self-pay | Admitting: Ophthalmology

## 2018-09-13 ENCOUNTER — Other Ambulatory Visit: Payer: Self-pay | Admitting: Family Medicine

## 2018-09-13 ENCOUNTER — Other Ambulatory Visit: Payer: Medicare Other

## 2018-09-13 ENCOUNTER — Ambulatory Visit
Admission: RE | Admit: 2018-09-13 | Discharge: 2018-09-13 | Disposition: A | Discharge status: Home / Self Care | Payer: Medicare Other | Source: Ambulatory Visit | Attending: Family Medicine | Admitting: Family Medicine

## 2018-09-13 DIAGNOSIS — R609 Edema, unspecified: Secondary | ICD-10-CM

## 2018-10-23 ENCOUNTER — Encounter (INDEPENDENT_AMBULATORY_CARE_PROVIDER_SITE_OTHER): Payer: Medicare Other | Admitting: Ophthalmology

## 2018-11-07 ENCOUNTER — Encounter (INDEPENDENT_AMBULATORY_CARE_PROVIDER_SITE_OTHER): Payer: Medicare Other | Admitting: Ophthalmology

## 2020-03-05 ENCOUNTER — Emergency Department (HOSPITAL_COMMUNITY)
Admission: EM | Admit: 2020-03-05 | Discharge: 2020-03-05 | Disposition: A | Discharge status: Home / Self Care | Payer: Medicare Other | Attending: Emergency Medicine | Admitting: Emergency Medicine

## 2020-03-05 ENCOUNTER — Emergency Department (HOSPITAL_COMMUNITY): Payer: Medicare Other

## 2020-03-05 ENCOUNTER — Other Ambulatory Visit: Payer: Self-pay

## 2020-03-05 DIAGNOSIS — M545 Low back pain: Secondary | ICD-10-CM | POA: Diagnosis not present

## 2020-03-05 DIAGNOSIS — R072 Precordial pain: Secondary | ICD-10-CM | POA: Diagnosis present

## 2020-03-05 DIAGNOSIS — G8929 Other chronic pain: Secondary | ICD-10-CM | POA: Diagnosis not present

## 2020-03-05 DIAGNOSIS — R110 Nausea: Secondary | ICD-10-CM | POA: Diagnosis not present

## 2020-03-05 DIAGNOSIS — M79606 Pain in leg, unspecified: Secondary | ICD-10-CM | POA: Insufficient documentation

## 2020-03-05 DIAGNOSIS — Z7982 Long term (current) use of aspirin: Secondary | ICD-10-CM | POA: Insufficient documentation

## 2020-03-05 DIAGNOSIS — Z87891 Personal history of nicotine dependence: Secondary | ICD-10-CM | POA: Diagnosis not present

## 2020-03-05 DIAGNOSIS — Z79899 Other long term (current) drug therapy: Secondary | ICD-10-CM | POA: Diagnosis not present

## 2020-03-05 DIAGNOSIS — E669 Obesity, unspecified: Secondary | ICD-10-CM | POA: Diagnosis not present

## 2020-03-05 DIAGNOSIS — I10 Essential (primary) hypertension: Secondary | ICD-10-CM | POA: Diagnosis not present

## 2020-03-05 DIAGNOSIS — Z6841 Body Mass Index (BMI) 40.0 and over, adult: Secondary | ICD-10-CM | POA: Insufficient documentation

## 2020-03-05 LAB — URINALYSIS, ROUTINE W REFLEX MICROSCOPIC
Bilirubin Urine: NEGATIVE
Glucose, UA: NEGATIVE mg/dL
Hgb urine dipstick: NEGATIVE
Ketones, ur: NEGATIVE mg/dL
Leukocytes,Ua: NEGATIVE
Nitrite: NEGATIVE
Protein, ur: NEGATIVE mg/dL
Specific Gravity, Urine: 1.023 (ref 1.005–1.030)
pH: 5 (ref 5.0–8.0)

## 2020-03-05 LAB — TROPONIN I (HIGH SENSITIVITY): Troponin I (High Sensitivity): 7 ng/L (ref <18)

## 2020-03-05 LAB — LIPASE, BLOOD: Lipase: 24 U/L (ref 11–51)

## 2020-03-05 LAB — COMPREHENSIVE METABOLIC PANEL
ALT: 32 U/L (ref 0–44)
AST: 29 U/L (ref 15–41)
Albumin: 3.9 g/dL (ref 3.5–5.0)
Alkaline Phosphatase: 61 U/L (ref 38–126)
Anion gap: 13 (ref 5–15)
BUN: 25 mg/dL — ABNORMAL HIGH (ref 8–23)
CO2: 24 mmol/L (ref 22–32)
Calcium: 10.1 mg/dL (ref 8.9–10.3)
Chloride: 99 mmol/L (ref 98–111)
Creatinine, Ser: 0.83 mg/dL (ref 0.44–1.00)
GFR calc Af Amer: >60 mL/min (ref >60)
GFR calc non Af Amer: >60 mL/min (ref >60)
Glucose, Bld: 147 mg/dL — ABNORMAL HIGH (ref 70–99)
Potassium: 4 mmol/L (ref 3.5–5.1)
Sodium: 136 mmol/L (ref 135–145)
Total Bilirubin: 0.5 mg/dL (ref 0.3–1.2)
Total Protein: 7.4 g/dL (ref 6.5–8.1)

## 2020-03-05 LAB — CBC
HCT: 48.9 % — ABNORMAL HIGH (ref 36.0–46.0)
Hemoglobin: 15.5 g/dL — ABNORMAL HIGH (ref 12.0–15.0)
MCH: 28.7 pg (ref 26.0–34.0)
MCHC: 31.7 g/dL (ref 30.0–36.0)
MCV: 90.4 fL (ref 80.0–100.0)
Platelets: 223 10*3/uL (ref 150–400)
RBC: 5.41 MIL/uL — ABNORMAL HIGH (ref 3.87–5.11)
RDW: 13.5 % (ref 11.5–15.5)
WBC: 10.1 10*3/uL (ref 4.0–10.5)
nRBC: 0 % (ref 0.0–0.2)

## 2020-03-05 MED ORDER — ONDANSETRON HCL 4 MG/2ML IJ SOLN
4.0000 mg | Freq: Once | INTRAMUSCULAR | Status: AC
  Administered 2020-03-05: 12:00:00 4 mg via INTRAVENOUS
  Filled 2020-03-05: qty 2

## 2020-03-05 MED ORDER — SODIUM CHLORIDE 0.9 % IV BOLUS
1000.0000 mL | Freq: Once | INTRAVENOUS | Status: AC
  Administered 2020-03-05: 12:00:00 1000 mL via INTRAVENOUS

## 2020-03-05 MED ORDER — METHOCARBAMOL 500 MG PO TABS
750.0000 mg | ORAL_TABLET | Freq: Once | ORAL | Status: AC
  Administered 2020-03-05: 15:00:00 750 mg via ORAL
  Filled 2020-03-05: qty 2

## 2020-03-05 MED ORDER — HYDROMORPHONE HCL 1 MG/ML IJ SOLN
1.0000 mg | Freq: Once | INTRAMUSCULAR | Status: AC
  Administered 2020-03-05: 1 mg via INTRAVENOUS
  Filled 2020-03-05: qty 1

## 2020-03-05 MED ORDER — TRAMADOL HCL 50 MG PO TABS
50.0000 mg | ORAL_TABLET | Freq: Once | ORAL | Status: AC
  Administered 2020-03-05: 14:00:00 50 mg via ORAL
  Filled 2020-03-05: qty 1

## 2020-03-05 MED ORDER — LORAZEPAM 2 MG/ML IJ SOLN
1.0000 mg | Freq: Once | INTRAMUSCULAR | Status: AC
  Administered 2020-03-05: 15:00:00 1 mg via INTRAVENOUS
  Filled 2020-03-05: qty 1

## 2020-03-05 MED ORDER — ALUM & MAG HYDROXIDE-SIMETH 200-200-20 MG/5ML PO SUSP
30.0000 mL | Freq: Once | ORAL | Status: AC
  Administered 2020-03-05: 12:00:00 30 mL via ORAL
  Filled 2020-03-05: qty 30

## 2020-03-05 MED ORDER — FAMOTIDINE IN NACL 20-0.9 MG/50ML-% IV SOLN
20.0000 mg | Freq: Once | INTRAVENOUS | Status: AC
  Administered 2020-03-05: 12:00:00 20 mg via INTRAVENOUS
  Filled 2020-03-05: qty 50

## 2020-03-05 MED ORDER — ACETAMINOPHEN 500 MG PO TABS
1000.0000 mg | ORAL_TABLET | Freq: Once | ORAL | Status: AC
  Administered 2020-03-05: 12:00:00 1000 mg via ORAL
  Filled 2020-03-05: qty 2

## 2020-03-05 NOTE — ED Triage Notes (Addendum)
Pt BIB [facility] for pain all over. Per EMS patient was on her way to the hospital when she called 911 to be transported here. Pt woke with lower back at 0400 this morning and leg pain. Pt reports she took oxycodone at 0430 this morning which she is prescribed for chronic back pain. Pt reports no relief of pain after taking the oxycodone. Pt denying any chest pain, shortness of breath. Pt uses a wheelchair to get around at baseline.

## 2020-03-05 NOTE — ED Notes (Signed)
Pt tearful at this time. Requesting more pain medication at this time for her pain.

## 2020-03-05 NOTE — Discharge Instructions (Addendum)
It was our pleasure to provide your ER care today - we hope that you feel better.  Rest. Avoid bending at waist or heavy lifting > 10 lbs.   Try heat therapy, or massage, to sore area.   Take your pain medication as need.   Follow up with primary care  doctor in the next 1-2 days.   Return to ER if worse, new symptoms, fevers, recurrent or persistent chest pain, increased trouble breathing, leg numbness/weakness, or other concern.   You were given pain medication in the ER - no driving for the next 8 hours, or if/when taking sedating medication.
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
## 2020-03-05 NOTE — ED Notes (Signed)
Pt ambulated herself to a chair. Reports that she cannot lay in the bed

## 2020-03-05 NOTE — ED Notes (Signed)
Patient verbalizes understanding of discharge instructions. Opportunity for questioning and answers were provided. Armband removed by staff, pt discharged from ED.  

## 2020-04-15 ENCOUNTER — Other Ambulatory Visit: Payer: Self-pay | Admitting: Neurosurgery

## 2020-04-15 DIAGNOSIS — M5412 Radiculopathy, cervical region: Secondary | ICD-10-CM

## 2021-01-29 ENCOUNTER — Ambulatory Visit: Payer: Medicare Other | Admitting: Podiatry

## 2021-02-04 ENCOUNTER — Ambulatory Visit: Payer: Medicare Other | Admitting: Podiatry

## 2021-04-17 DIAGNOSIS — N3281 Overactive bladder: Secondary | ICD-10-CM | POA: Insufficient documentation

## 2021-04-17 DIAGNOSIS — K859 Acute pancreatitis without necrosis or infection, unspecified: Secondary | ICD-10-CM | POA: Insufficient documentation

## 2021-04-17 DIAGNOSIS — M1990 Unspecified osteoarthritis, unspecified site: Secondary | ICD-10-CM | POA: Insufficient documentation

## 2022-06-08 ENCOUNTER — Emergency Department (HOSPITAL_BASED_OUTPATIENT_CLINIC_OR_DEPARTMENT_OTHER): Payer: Medicare Other | Admitting: Radiology

## 2022-06-08 ENCOUNTER — Emergency Department (HOSPITAL_BASED_OUTPATIENT_CLINIC_OR_DEPARTMENT_OTHER)
Admission: EM | Admit: 2022-06-08 | Discharge: 2022-06-08 | Disposition: A | Discharge status: Home / Self Care | Payer: Medicare Other | Attending: Emergency Medicine | Admitting: Emergency Medicine

## 2022-06-08 ENCOUNTER — Ambulatory Visit
Admission: EM | Admit: 2022-06-08 | Discharge: 2022-06-08 | Disposition: A | Discharge status: Other Acute Inpatient Hospital | Payer: Medicare Other | Attending: Internal Medicine | Admitting: Internal Medicine

## 2022-06-08 ENCOUNTER — Encounter: Payer: Self-pay | Admitting: Emergency Medicine

## 2022-06-08 ENCOUNTER — Encounter (HOSPITAL_BASED_OUTPATIENT_CLINIC_OR_DEPARTMENT_OTHER): Payer: Self-pay | Admitting: Emergency Medicine

## 2022-06-08 ENCOUNTER — Other Ambulatory Visit: Payer: Self-pay

## 2022-06-08 DIAGNOSIS — Z7951 Long term (current) use of inhaled steroids: Secondary | ICD-10-CM | POA: Diagnosis not present

## 2022-06-08 DIAGNOSIS — R0602 Shortness of breath: Secondary | ICD-10-CM | POA: Diagnosis not present

## 2022-06-08 DIAGNOSIS — R0981 Nasal congestion: Secondary | ICD-10-CM | POA: Diagnosis not present

## 2022-06-08 DIAGNOSIS — Z7982 Long term (current) use of aspirin: Secondary | ICD-10-CM | POA: Insufficient documentation

## 2022-06-08 DIAGNOSIS — Z79899 Other long term (current) drug therapy: Secondary | ICD-10-CM | POA: Insufficient documentation

## 2022-06-08 DIAGNOSIS — R0682 Tachypnea, not elsewhere classified: Secondary | ICD-10-CM | POA: Insufficient documentation

## 2022-06-08 DIAGNOSIS — R051 Acute cough: Secondary | ICD-10-CM

## 2022-06-08 DIAGNOSIS — R7981 Abnormal blood-gas level: Secondary | ICD-10-CM

## 2022-06-08 DIAGNOSIS — J45901 Unspecified asthma with (acute) exacerbation: Secondary | ICD-10-CM | POA: Insufficient documentation

## 2022-06-08 LAB — BASIC METABOLIC PANEL
Anion gap: 9 (ref 5–15)
BUN: 14 mg/dL (ref 8–23)
CO2: 32 mmol/L (ref 22–32)
Calcium: 9.9 mg/dL (ref 8.9–10.3)
Chloride: 97 mmol/L — ABNORMAL LOW (ref 98–111)
Creatinine, Ser: 0.75 mg/dL (ref 0.44–1.00)
GFR, Estimated: >60 mL/min (ref >60)
Glucose, Bld: 94 mg/dL (ref 70–99)
Potassium: 3.7 mmol/L (ref 3.5–5.1)
Sodium: 138 mmol/L (ref 135–145)

## 2022-06-08 LAB — CBC WITH DIFFERENTIAL/PLATELET
Abs Immature Granulocytes: 0.03 10*3/uL (ref 0.00–0.07)
Basophils Absolute: 0.1 10*3/uL (ref 0.0–0.1)
Basophils Relative: 1 %
Eosinophils Absolute: 0.5 10*3/uL (ref 0.0–0.5)
Eosinophils Relative: 5 %
HCT: 47.5 % — ABNORMAL HIGH (ref 36.0–46.0)
Hemoglobin: 15.1 g/dL — ABNORMAL HIGH (ref 12.0–15.0)
Immature Granulocytes: 0 %
Lymphocytes Relative: 20 %
Lymphs Abs: 2 10*3/uL (ref 0.7–4.0)
MCH: 28.4 pg (ref 26.0–34.0)
MCHC: 31.8 g/dL (ref 30.0–36.0)
MCV: 89.5 fL (ref 80.0–100.0)
Monocytes Absolute: 0.9 10*3/uL (ref 0.1–1.0)
Monocytes Relative: 9 %
Neutro Abs: 6.6 10*3/uL (ref 1.7–7.7)
Neutrophils Relative %: 65 %
Platelets: 215 10*3/uL (ref 150–400)
RBC: 5.31 MIL/uL — ABNORMAL HIGH (ref 3.87–5.11)
RDW: 13.6 % (ref 11.5–15.5)
WBC: 10.1 10*3/uL (ref 4.0–10.5)
nRBC: 0 % (ref 0.0–0.2)

## 2022-06-08 LAB — BRAIN NATRIURETIC PEPTIDE: B Natriuretic Peptide: 30.3 pg/mL (ref 0.0–100.0)

## 2022-06-08 MED ORDER — ALBUTEROL SULFATE HFA 108 (90 BASE) MCG/ACT IN AERS
1.0000 | INHALATION_SPRAY | Freq: Four times a day (QID) | RESPIRATORY_TRACT | Status: DC
  Administered 2022-06-08: 1 via RESPIRATORY_TRACT
  Filled 2022-06-08: qty 6.7

## 2022-06-08 MED ORDER — ALBUTEROL SULFATE (2.5 MG/3ML) 0.083% IN NEBU
2.5000 mg | INHALATION_SOLUTION | Freq: Once | RESPIRATORY_TRACT | Status: AC
  Administered 2022-06-08: 2.5 mg via RESPIRATORY_TRACT

## 2022-06-08 MED ORDER — STERILE WATER FOR INJECTION IJ SOLN
INTRAMUSCULAR | Status: AC
  Filled 2022-06-08: qty 10

## 2022-06-08 MED ORDER — SODIUM CHLORIDE 0.9 % IV SOLN
250.0000 mg | Freq: Once | INTRAVENOUS | Status: DC
  Filled 2022-06-08: qty 4

## 2022-06-08 MED ORDER — IPRATROPIUM-ALBUTEROL 0.5-2.5 (3) MG/3ML IN SOLN
3.0000 mL | Freq: Once | RESPIRATORY_TRACT | Status: AC
  Administered 2022-06-08: 3 mL via RESPIRATORY_TRACT
  Filled 2022-06-08: qty 3

## 2022-06-08 MED ORDER — METHYLPREDNISOLONE SODIUM SUCC 125 MG IJ SOLR
125.0000 mg | Freq: Once | INTRAMUSCULAR | Status: DC
  Filled 2022-06-08: qty 2

## 2022-06-08 MED ORDER — PREDNISONE 20 MG PO TABS: 20.0000 mg | ORAL_TABLET | Freq: Two times a day (BID) | ORAL | 0 refills | Status: AC

## 2022-06-08 MED ORDER — ALBUTEROL SULFATE (2.5 MG/3ML) 0.083% IN NEBU
2.5000 mg | INHALATION_SOLUTION | Freq: Once | RESPIRATORY_TRACT | Status: AC
  Administered 2022-06-08: 2.5 mg via RESPIRATORY_TRACT
  Filled 2022-06-08: qty 3

## 2022-06-08 MED ORDER — METHYLPREDNISOLONE SODIUM SUCC 125 MG IJ SOLR
125.0000 mg | Freq: Once | INTRAMUSCULAR | Status: AC
  Administered 2022-06-08: 125 mg via INTRAVENOUS
  Filled 2022-06-08: qty 2

## 2022-06-08 MED ORDER — ALBUTEROL SULFATE HFA 108 (90 BASE) MCG/ACT IN AERS: 2.0000 | INHALATION_SPRAY | RESPIRATORY_TRACT | Status: DC | PRN
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
## 2022-06-08 NOTE — ED Triage Notes (Signed)
Pt here for cough and congestion x 5 days; pt sts some SOB

## 2022-06-08 NOTE — ED Triage Notes (Signed)
Sob, cough x 7 days. Hx of asthma. Wheezes. Was seen at [facility] requiring o2, was given a douneb. Ems called. Ems VS  118/68, 76, 95% on RA, afebrile Tried OTC medication with no improvement.

## 2022-06-08 NOTE — Discharge Instructions (Signed)
Please go to the emergency department as soon as you leave the urgent care for further evaluation and management.

## 2022-06-08 NOTE — ED Notes (Signed)
Patient is being discharged from the Urgent Care and sent to the Emergency Department via EMS . Per HM, patient is in need of higher level of care due to SOB. Patient is aware and verbalizes understanding of plan of care.  Vitals:   06/08/22 1703  BP: 113/72  Pulse: 77  Resp: 20  Temp: 98 F (36.7 C)  SpO2: 91%

## 2022-06-08 NOTE — Discharge Instructions (Signed)
Call your primary care doctor or specialist as discussed in the next 2-3 days.   Return immediately back to the ER if:  Your symptoms worsen within the next 12-24 hours. You develop new symptoms such as new fevers, persistent vomiting, new pain, shortness of breath, or new weakness or numbness, or if you have any other concerns.  

## 2022-06-08 NOTE — ED Provider Notes (Signed)
[facility]   CSN: [id number] Arrival date & time: 06/08/22  1836     History  Chief Complaint  Patient presents with   Shortness of Breath    [name] [name] is a 66 y.o. female.  Patient presents chief complaint of shortness of breath.  She has a history of asthma has had a cough nasal congestion for about a week now.  She states she lost her nebulizer treatment machine and went to urgent care today, evaluated and sent to the ER.  She otherwise denies any headache or chest pain no fever no vomiting no diarrhea reported.       Home Medications Prior to Admission medications   Medication Sig Start Date End Date Taking? Authorizing Provider  predniSONE (DELTASONE) 20 MG tablet Take 1 tablet (20 mg total) by mouth 2 (two) times daily with a meal for 5 days. 06/08/22 06/13/22 Yes Cheryll CockayneHong, Barri Neidlinger S, MD  aspirin 81 MG chewable tablet Chew by mouth daily.    [provider]  baclofen (LIORESAL) 10 MG tablet Take 10-20 mg by mouth See admin instructions. Take 20mg  in the morning, 10mg  at lunch, 20mg  at dinner and 20mg  at bedtime. 02/10/20   [provider]  cholecalciferol (VITAMIN D) 1000 UNITS tablet Take 1,000 Units by mouth daily.    [provider]  docusate sodium (COLACE) 100 MG capsule Take 100 mg by mouth in the morning, at noon, and at bedtime.    [provider]  FIBER PO Take 2 capsules by mouth every 6 (six) hours as needed (for constipation). Take a fiber pill whenever taking a pain pill    [provider]  hydrochlorothiazide (HYDRODIURIL) 25 MG tablet Take 50 mg by mouth daily.  03/03/20   [provider]  lisinopril (ZESTRIL) 20 MG tablet Take 20 mg by mouth daily. 03/03/20   [provider]  meloxicam (MOBIC) 7.5 MG tablet Take 7.5 mg by mouth daily.    [provider]  Multiple Vitamin (MULTIVITAMIN WITH MINERALS) TABS Take 1 tablet by mouth daily.    [provider]  MYRBETRIQ 50 MG TB24 tablet Take 50 mg by mouth daily. 01/16/20   [provider]  oxyCODONE-acetaminophen (PERCOCET/ROXICET) 5-325 MG per tablet Take 1 tablet by mouth every 6 (six) hours as needed for moderate pain.  08/06/14   [provider]  Pancrelipase, Lip-Prot-Amyl, 36000 UNITS CPEP Take 36,000 Units by mouth 3 (three) times daily before meals.     [provider]  polyethylene glycol (MIRALAX / GLYCOLAX) packet Take 17 g by mouth at bedtime.    [provider]  sucralfate (CARAFATE) 1 g tablet Take 1 g by mouth in the morning, at noon, and at bedtime.     [provider]      Allergies    Ibuprofen, Other, and Tape    Review of Systems   Review of Systems  Constitutional:  Negative for fever.  HENT:  Negative for ear pain.   Eyes:  Negative for pain.  Respiratory:  Positive for cough and shortness of breath.   Cardiovascular:  Negative for chest pain.  Gastrointestinal:  Negative for abdominal pain.  Genitourinary:  Negative for flank pain.  Musculoskeletal:  Negative for back pain.  Skin:  Negative for rash.  Neurological:  Negative for headaches.    Physical Exam Updated Vital Signs BP 115/61   Pulse 76   Temp 97.6 F (36.4 C)   Resp  19   Ht 5\' 2"  (1.575 m)   Wt (!) 157.9 kg   SpO2 95%   BMI 63.65 kg/m  Physical Exam Constitutional:      General: She is not in acute distress.    Appearance: Normal appearance.  HENT:     Head: Normocephalic.     Nose: Nose normal.  Eyes:     Extraocular Movements: Extraocular movements intact.  Cardiovascular:     Rate and Rhythm: Normal rate.  Pulmonary:     Effort: Tachypnea present.     Breath sounds: Wheezing present.  Musculoskeletal:        General: Normal range of motion.     Cervical back: Normal range of motion.  Neurological:     General: No focal deficit present.     Mental Status: She is alert. Mental status is at baseline.     ED Results /  Procedures / Treatments   Labs (all labs ordered are listed, but only abnormal results are displayed) Labs Reviewed  CBC WITH DIFFERENTIAL/PLATELET - Abnormal; Notable for the following components:      Result Value   RBC 5.31 (*)    Hemoglobin 15.1 (*)    HCT 47.5 (*)    All other components within normal limits  BASIC METABOLIC PANEL - Abnormal; Notable for the following components:   Chloride 97 (*)    All other components within normal limits  BRAIN NATRIURETIC PEPTIDE    EKG EKG Interpretation  Date/Time:  Tuesday June 08 2022 18:48:54 EDT Ventricular Rate:  77 PR Interval:  156 QRS Duration: 96 QT Interval:  376 QTC Calculation: 425 R Axis:   -20 Text Interpretation: Normal sinus rhythm with sinus arrhythmia Low voltage QRS Borderline ECG When compared with ECG of 05-Mar-2020 11:32, PREVIOUS ECG IS PRESENT Confirmed by 07-Mar-2020 ([id number]) on 06/08/2022 6:51:57 PM  Radiology DG Chest Port 1 View  Result Date: 06/08/2022 CLINICAL DATA:  Cough EXAM: PORTABLE CHEST 1 VIEW COMPARISON:  03/05/2020 FINDINGS: Heart and mediastinal contours are within normal limits. No focal opacities or effusions. No acute bony abnormality. IMPRESSION: No active disease. Electronically Signed   By: 05/05/2020 M.D.   On: 06/08/2022 19:28    Procedures .Critical Care  Performed by: 06/10/2022, MD Authorized by: Cheryll Cockayne, MD   Critical care provider statement:    Critical care time (minutes):  40   Critical care time was exclusive of:  Separately billable procedures and treating other patients and teaching time   Critical care was necessary to treat or prevent imminent or life-threatening deterioration of the following conditions:  Respiratory failure Comments:     Solu-Medrol IV and multiple breathing treatments provided.     Medications Ordered in ED Medications  albuterol (VENTOLIN HFA) 108 (90 Base) MCG/ACT inhaler 1 puff (has no administration in time range)   ipratropium-albuterol (DUONEB) 0.5-2.5 (3) MG/3ML nebulizer solution 3 mL (3 mLs Nebulization Given 06/08/22 1857)  albuterol (PROVENTIL) (2.5 MG/3ML) 0.083% nebulizer solution 2.5 mg (2.5 mg Nebulization Given 06/08/22 1857)  albuterol (PROVENTIL) (2.5 MG/3ML) 0.083% nebulizer solution 2.5 mg (2.5 mg Nebulization Given 06/08/22 1954)  methylPREDNISolone sodium succinate (SOLU-MEDROL) 125 mg/2 mL injection 125 mg (125 mg Intravenous Given 06/08/22 2019)  sterile water (preservative free) injection (  Given 06/08/22 2019)    ED Course/ Medical Decision Making/ A&P  Medical Decision Making Amount and/or Complexity of Data Reviewed Labs: ordered. Radiology: ordered.  Risk Prescription drug management.   Review of external records shows prior visit to urgent care earlier today.  Cardiac monitor shows sinus rhythm.  Diagnosis studies sent white count normal at 10 hemoglobin normal chemistry unremarkable proBNP normal as well.  Chest x-ray shows no infiltrate effusion or edema.  Patient given Solu-Medrol IV and multiple breathing treatments x3.  Subsequently O2 saturation is improved now at 96 to 97% on room air respiratory effort is improved as well.  I feel she stable for continued outpatient care.  Advising immediate return for worsening symptoms otherwise follow-up with her primary care doctor this week.        Final Clinical Impression(s) / ED Diagnoses Final diagnoses:  Severe asthma with exacerbation, unspecified whether persistent    Rx / DC Orders ED Discharge Orders          Ordered    predniSONE (DELTASONE) 20 MG tablet  2 times daily with meals        06/08/22 2108              Cheryll Cockayne, MD 06/08/22 2109

## 2022-09-07 ENCOUNTER — Other Ambulatory Visit (HOSPITAL_COMMUNITY): Payer: Self-pay

## 2022-09-07 MED ORDER — PANCRELIPASE (LIP-PROT-AMYL) 36000-114000 UNITS PO CPEP
36000.0000 [IU] | ORAL_CAPSULE | Freq: Three times a day (TID) | ORAL | 0 refills | Status: DC
  Filled 2022-09-07: qty 180, 60d supply, fill #0

## 2022-09-08 ENCOUNTER — Other Ambulatory Visit (HOSPITAL_COMMUNITY): Payer: Self-pay

## 2022-09-13 ENCOUNTER — Ambulatory Visit: Payer: Medicare Other | Admitting: Family Medicine

## 2022-11-09 ENCOUNTER — Ambulatory Visit: Payer: Medicare Other | Admitting: Family Medicine

## 2022-11-09 ENCOUNTER — Encounter (HOSPITAL_BASED_OUTPATIENT_CLINIC_OR_DEPARTMENT_OTHER): Payer: Self-pay | Admitting: Emergency Medicine

## 2022-11-09 ENCOUNTER — Emergency Department (HOSPITAL_BASED_OUTPATIENT_CLINIC_OR_DEPARTMENT_OTHER): Payer: Medicare Other

## 2022-11-09 ENCOUNTER — Other Ambulatory Visit: Payer: Self-pay

## 2022-11-09 ENCOUNTER — Ambulatory Visit: Payer: Self-pay

## 2022-11-09 ENCOUNTER — Emergency Department (HOSPITAL_BASED_OUTPATIENT_CLINIC_OR_DEPARTMENT_OTHER)
Admission: EM | Admit: 2022-11-09 | Discharge: 2022-11-09 | Disposition: A | Discharge status: Home / Self Care | Payer: Medicare Other | Attending: Emergency Medicine | Admitting: Emergency Medicine

## 2022-11-09 DIAGNOSIS — Z7982 Long term (current) use of aspirin: Secondary | ICD-10-CM | POA: Insufficient documentation

## 2022-11-09 DIAGNOSIS — Z993 Dependence on wheelchair: Secondary | ICD-10-CM | POA: Insufficient documentation

## 2022-11-09 DIAGNOSIS — Z79899 Other long term (current) drug therapy: Secondary | ICD-10-CM | POA: Diagnosis not present

## 2022-11-09 DIAGNOSIS — J45909 Unspecified asthma, uncomplicated: Secondary | ICD-10-CM | POA: Diagnosis not present

## 2022-11-09 DIAGNOSIS — Z87891 Personal history of nicotine dependence: Secondary | ICD-10-CM | POA: Insufficient documentation

## 2022-11-09 DIAGNOSIS — I10 Essential (primary) hypertension: Secondary | ICD-10-CM | POA: Insufficient documentation

## 2022-11-09 DIAGNOSIS — R2242 Localized swelling, mass and lump, left lower limb: Secondary | ICD-10-CM | POA: Diagnosis present

## 2022-11-09 DIAGNOSIS — L03116 Cellulitis of left lower limb: Secondary | ICD-10-CM | POA: Insufficient documentation

## 2022-11-09 DIAGNOSIS — R600 Localized edema: Secondary | ICD-10-CM | POA: Insufficient documentation

## 2022-11-09 DIAGNOSIS — J90 Pleural effusion, not elsewhere classified: Secondary | ICD-10-CM | POA: Insufficient documentation

## 2022-11-09 LAB — BASIC METABOLIC PANEL
Anion gap: 11 (ref 5–15)
BUN: 22 mg/dL (ref 8–23)
CO2: 25 mmol/L (ref 22–32)
Calcium: 10.1 mg/dL (ref 8.9–10.3)
Chloride: 97 mmol/L — ABNORMAL LOW (ref 98–111)
Creatinine, Ser: 0.78 mg/dL (ref 0.44–1.00)
GFR, Estimated: >60 mL/min (ref >60)
Glucose, Bld: 96 mg/dL (ref 70–99)
Potassium: 4.6 mmol/L (ref 3.5–5.1)
Sodium: 133 mmol/L — ABNORMAL LOW (ref 135–145)

## 2022-11-09 LAB — CBC WITH DIFFERENTIAL/PLATELET
Abs Immature Granulocytes: 0.03 10*3/uL (ref 0.00–0.07)
Basophils Absolute: 0.1 10*3/uL (ref 0.0–0.1)
Basophils Relative: 1 %
Eosinophils Absolute: 0.5 10*3/uL (ref 0.0–0.5)
Eosinophils Relative: 6 %
HCT: 53.3 % — ABNORMAL HIGH (ref 36.0–46.0)
Hemoglobin: 17 g/dL — ABNORMAL HIGH (ref 12.0–15.0)
Immature Granulocytes: 0 %
Lymphocytes Relative: 19 %
Lymphs Abs: 1.6 10*3/uL (ref 0.7–4.0)
MCH: 29.2 pg (ref 26.0–34.0)
MCHC: 31.9 g/dL (ref 30.0–36.0)
MCV: 91.6 fL (ref 80.0–100.0)
Monocytes Absolute: 0.8 10*3/uL (ref 0.1–1.0)
Monocytes Relative: 9 %
Neutro Abs: 5.4 10*3/uL (ref 1.7–7.7)
Neutrophils Relative %: 65 %
Platelets: 181 10*3/uL (ref 150–400)
RBC: 5.82 MIL/uL — ABNORMAL HIGH (ref 3.87–5.11)
RDW: 13.6 % (ref 11.5–15.5)
WBC: 8.3 10*3/uL (ref 4.0–10.5)
nRBC: 0 % (ref 0.0–0.2)

## 2022-11-09 LAB — HEPATIC FUNCTION PANEL
ALT: 20 U/L (ref 0–44)
AST: 29 U/L (ref 15–41)
Albumin: 4.3 g/dL (ref 3.5–5.0)
Alkaline Phosphatase: 44 U/L (ref 38–126)
Bilirubin, Direct: <0.1 mg/dL (ref 0.0–0.2)
Total Bilirubin: 0.5 mg/dL (ref 0.3–1.2)
Total Protein: 7.7 g/dL (ref 6.5–8.1)

## 2022-11-09 LAB — PROTIME-INR
INR: 1 (ref 0.8–1.2)
Prothrombin Time: 13.2 seconds (ref 11.4–15.2)

## 2022-11-09 LAB — BRAIN NATRIURETIC PEPTIDE: B Natriuretic Peptide: 45.1 pg/mL (ref 0.0–100.0)

## 2022-11-09 MED ORDER — CEPHALEXIN 500 MG PO CAPS: 500.0000 mg | ORAL_CAPSULE | Freq: Four times a day (QID) | ORAL | 0 refills | Status: AC

## 2022-11-09 NOTE — Telephone Encounter (Signed)
  Chief Complaint: Swollen left leg to the knee Symptoms: Mild moderate edema Frequency: unsure Pertinent Negatives: Patient denies Redness, pain, sob Disposition: [x] ED /[] Urgent Care (no appt availability in office) / [] Appointment(In office/virtual)/ []  [facility]/ [] Home Care/ [] Refused Recommended Disposition /[] [facility]/ []  Follow-up with PCP Additional Notes: Pt has mild -moderate left leg edema. Pt noticed it the other day, but is unsure how long it has been swollen. No pain or redness, no SOB. Pt is wheel chair bound.   Reason for Disposition  [1] Thigh, calf, or ankle swelling AND [2] only 1 side  Answer Assessment - Initial Assessment Questions 1. ONSET: "When did the swelling start?" (e.g., minutes, hours, days)     Unsure - may be a few days or more 2. LOCATION: "What part of the leg is swollen?"  "Are both legs swollen or just one leg?"     Left - lower leg 3. SEVERITY: "How bad is the swelling?" (e.g., localized; mild, moderate, severe)   - Localized: Small area of swelling localized to one leg.   - MILD pedal edema: Swelling limited to foot and ankle, pitting edema < 1/4 inch (6 mm) deep, rest and elevation eliminate most or all swelling.   - MODERATE edema: Swelling of lower leg to knee, pitting edema > 1/4 inch (6 mm) deep, rest and elevation only partially reduce swelling.   - SEVERE edema: Swelling extends above knee, facial or hand swelling present.      mild - moderate 4. REDNESS: "Does the swelling look red or infected?"     no 5. PAIN: "Is the swelling painful to touch?" If Yes, ask: "How painful is it?"   (Scale 1-10; mild, moderate or severe)     no 6. FEVER: "Do you have a fever?" If Yes, ask: "What is it, how was it measured, and when did it start?"      no 7. CAUSE: "What do you think is causing the leg swelling?"     unsure 8. MEDICAL HISTORY: "Do you have a history of blood clots (e.g., DVT), cancer, heart failure, kidney  disease, or liver failure?"     no 9. RECURRENT SYMPTOM: "Have you had leg swelling before?" If Yes, ask: "When was the last time?" "What happened that time?"     no 10. OTHER SYMPTOMS: "Do you have any other symptoms?" (e.g., chest pain, difficulty breathing)       no 11. PREGNANCY: "Is there any chance you are pregnant?" "When was your last menstrual period?"       no  Protocols used: Leg Swelling and Edema-A-AH
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
## 2022-11-09 NOTE — ED Triage Notes (Signed)
Pt arrived POV. Pt caox4. Pt states she noticed swelling in her left lower leg 2 days ago and was going to see her PCP but was unable to make that appt so PCP told her to come to the ED for eval for possibility of blood clot. Pt denies pain, temp and  sensation equal in both extremities. Pt denied any additional complaints.

## 2022-11-09 NOTE — Discharge Instructions (Signed)
We evaluated you for your leg swelling and pain.  Your ultrasound was negative for any blood clots.  Your physical examination showed some redness and warmth of your skin, which suggests that you have a skin infection.  Please take the antibiotics we have prescribed 4 times a day for 10 days.  Please keep a close eye on your leg.  If your leg swelling gets worse, or if you have any increasing pain, fevers or chills, wounds on the leg, or any other concerning symptoms, please return to the emergency department for repeat evaluation.

## 2022-11-10 ENCOUNTER — Telehealth: Payer: Self-pay | Admitting: *Deleted

## 2022-11-10 NOTE — Telephone Encounter (Signed)
I called patient and talk with her and she was ok to wait until January 2024. If anything changes she will  call us  back.     Copied from CRM [id number]. Topic: Appointment Scheduling - Scheduling Inquiry for Clinic >> Nov 09, 2022  8:00 AM [name] [name] wrote: Reason for CRM: Pt missing 8:00 this am N PT Dr Lacretia Nicks. Crying states waiting for months for this appt, Pt in a wheelchair, sched Benedetto Goad to come out, told them that she was in a wheelchair and arrived with no room in car for wheelchair. Resch to Jan 30, put on wait list, needs sooner appt, Sent to NT as one leg bigger than the other. FU with appt offer if possible sooner than scheduled [phone]

## 2022-11-16 ENCOUNTER — Other Ambulatory Visit (HOSPITAL_COMMUNITY): Payer: Self-pay

## 2022-11-16 MED ORDER — PANCRELIPASE (LIP-PROT-AMYL) 36000-114000 UNITS PO CPEP
36000.0000 [IU] | ORAL_CAPSULE | Freq: Three times a day (TID) | ORAL | 0 refills | Status: DC
  Filled 2022-11-16 (×2): qty 270, 90d supply, fill #0

## 2022-11-17 ENCOUNTER — Other Ambulatory Visit (HOSPITAL_COMMUNITY): Payer: Self-pay

## 2022-12-02 ENCOUNTER — Ambulatory Visit: Payer: Medicare Other | Admitting: Family Medicine

## 2022-12-30 ENCOUNTER — Other Ambulatory Visit (HOSPITAL_COMMUNITY): Payer: Self-pay

## 2022-12-31 ENCOUNTER — Other Ambulatory Visit (HOSPITAL_COMMUNITY): Payer: Self-pay

## 2022-12-31 MED ORDER — CREON 36000-114000 UNITS PO CPEP
36000.0000 [IU] | ORAL_CAPSULE | Freq: Four times a day (QID) | ORAL | 0 refills | Status: DC | PRN
  Filled 2022-12-31 – 2023-01-30 (×2): qty 360, 90d supply, fill #0

## 2022-12-31 MED ORDER — CREON 36000-114000 UNITS PO CPEP
36000.0000 [IU] | ORAL_CAPSULE | Freq: Four times a day (QID) | ORAL | 0 refills | Status: DC | PRN
  Filled 2023-01-03 – 2023-06-27 (×3): qty 360, 90d supply, fill #0

## 2023-01-03 ENCOUNTER — Other Ambulatory Visit (HOSPITAL_COMMUNITY): Payer: Self-pay

## 2023-01-13 ENCOUNTER — Other Ambulatory Visit (HOSPITAL_COMMUNITY): Payer: Self-pay

## 2023-01-13 ENCOUNTER — Other Ambulatory Visit: Payer: Self-pay

## 2023-01-13 MED ORDER — MYRBETRIQ 50 MG PO TB24
50.0000 mg | ORAL_TABLET | Freq: Every day | ORAL | 0 refills | Status: DC
Start: 2023-01-13 — End: 2023-01-31
  Filled 2023-01-13: qty 90, 90d supply, fill #0

## 2023-01-13 MED ORDER — HYDROCHLOROTHIAZIDE 25 MG PO TABS
50.0000 mg | ORAL_TABLET | Freq: Every day | ORAL | 0 refills | Status: DC | PRN
  Filled 2023-01-13 – 2023-03-24 (×3): qty 180, 90d supply, fill #0

## 2023-01-13 MED ORDER — LISINOPRIL 20 MG PO TABS
20.0000 mg | ORAL_TABLET | Freq: Every day | ORAL | 0 refills | Status: DC
Start: 2023-01-13 — End: 2023-06-28
  Filled 2023-01-13 – 2023-01-30 (×2): qty 90, 90d supply, fill #0

## 2023-01-14 ENCOUNTER — Other Ambulatory Visit (HOSPITAL_COMMUNITY): Payer: Self-pay

## 2023-01-26 ENCOUNTER — Ambulatory Visit: Payer: Medicare Other | Admitting: Family Medicine

## 2023-01-30 ENCOUNTER — Other Ambulatory Visit (HOSPITAL_COMMUNITY): Payer: Self-pay

## 2023-01-31 ENCOUNTER — Other Ambulatory Visit (HOSPITAL_COMMUNITY): Payer: Self-pay

## 2023-01-31 ENCOUNTER — Other Ambulatory Visit: Payer: Self-pay

## 2023-01-31 MED ORDER — MIRABEGRON ER 50 MG PO TB24
50.0000 mg | ORAL_TABLET | Freq: Every day | ORAL | 1 refills | Status: DC
Start: 2023-01-31 — End: 2023-10-03
  Filled 2023-01-31 – 2023-04-11 (×2): qty 90, 90d supply, fill #0
  Filled 2023-06-27: qty 90, 90d supply, fill #1

## 2023-02-01 ENCOUNTER — Other Ambulatory Visit (HOSPITAL_COMMUNITY): Payer: Self-pay

## 2023-03-21 ENCOUNTER — Other Ambulatory Visit (HOSPITAL_COMMUNITY): Payer: Self-pay

## 2023-03-21 MED ORDER — HYDROCHLOROTHIAZIDE 50 MG PO TABS
50.0000 mg | ORAL_TABLET | Freq: Every day | ORAL | 0 refills | Status: DC | PRN
Start: 2023-04-13 — End: 2023-09-12
  Filled 2023-04-13: qty 90, 90d supply, fill #0

## 2023-03-24 ENCOUNTER — Other Ambulatory Visit (HOSPITAL_COMMUNITY): Payer: Self-pay

## 2023-03-24 ENCOUNTER — Other Ambulatory Visit: Payer: Self-pay

## 2023-04-11 ENCOUNTER — Other Ambulatory Visit: Payer: Self-pay

## 2023-04-13 ENCOUNTER — Other Ambulatory Visit (HOSPITAL_COMMUNITY): Payer: Self-pay

## 2023-04-13 ENCOUNTER — Other Ambulatory Visit: Payer: Self-pay

## 2023-05-16 ENCOUNTER — Other Ambulatory Visit (HOSPITAL_COMMUNITY): Payer: Self-pay

## 2023-05-27 ENCOUNTER — Other Ambulatory Visit (HOSPITAL_COMMUNITY): Payer: Self-pay

## 2023-06-24 ENCOUNTER — Ambulatory Visit: Payer: Medicare Other | Admitting: Cardiology

## 2023-06-24 ENCOUNTER — Encounter: Payer: Self-pay | Admitting: Cardiology

## 2023-06-24 VITALS — BP 128/82 | HR 90 | Resp 16 | Ht 62.0 in | Wt 327.0 lb

## 2023-06-24 DIAGNOSIS — M79602 Pain in left arm: Secondary | ICD-10-CM

## 2023-06-24 DIAGNOSIS — I10 Essential (primary) hypertension: Secondary | ICD-10-CM
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
## 2023-06-27 ENCOUNTER — Other Ambulatory Visit (HOSPITAL_COMMUNITY): Payer: Self-pay

## 2023-06-28 ENCOUNTER — Other Ambulatory Visit: Payer: Self-pay

## 2023-06-28 ENCOUNTER — Other Ambulatory Visit (HOSPITAL_COMMUNITY): Payer: Self-pay

## 2023-06-28 MED ORDER — LISINOPRIL 20 MG PO TABS
20.0000 mg | ORAL_TABLET | Freq: Every day | ORAL | 1 refills | Status: DC
  Filled 2023-06-28 – 2023-08-03 (×2): qty 100, 100d supply, fill #0
  Filled 2023-11-07: qty 90, 90d supply, fill #1
  Filled 2024-01-24: qty 90, 90d supply, fill #2

## 2023-07-18 ENCOUNTER — Ambulatory Visit: Payer: Medicare Other | Admitting: Family Medicine

## 2023-07-18 ENCOUNTER — Encounter: Payer: Self-pay | Admitting: Family Medicine

## 2023-07-18 VITALS — BP 116/76 | HR 75 | Temp 98.1°F | Resp 20 | Ht 62.0 in | Wt 357.0 lb

## 2023-07-18 DIAGNOSIS — Z9889 Other specified postprocedural states: Secondary | ICD-10-CM | POA: Diagnosis not present

## 2023-07-18 DIAGNOSIS — Z8739 Personal history of other diseases of the musculoskeletal system and connective tissue: Secondary | ICD-10-CM | POA: Diagnosis not present

## 2023-07-18 DIAGNOSIS — E66813 Obesity, class 3: Secondary | ICD-10-CM

## 2023-07-18 DIAGNOSIS — Z6841 Body Mass Index (BMI) 40.0 and over, adult: Secondary | ICD-10-CM

## 2023-07-18 DIAGNOSIS — I10 Essential (primary) hypertension: Secondary | ICD-10-CM | POA: Diagnosis not present

## 2023-07-18 DIAGNOSIS — Z7689 Persons encountering health services in other specified circumstances: Secondary | ICD-10-CM
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
## 2023-07-18 NOTE — Progress Notes (Unsigned)
Patient is here to established care with provider today. Patient has many health concern they would like to discuss with provider today  Care gaps discuss at appointment today  

## 2023-07-20 ENCOUNTER — Ambulatory Visit (HOSPITAL_COMMUNITY)
Admission: RE | Admit: 2023-07-20 | Discharge: 2023-07-20 | Disposition: A | Discharge status: Home / Self Care | Payer: Medicare Other | Source: Ambulatory Visit | Attending: Cardiology | Admitting: Cardiology

## 2023-07-20 ENCOUNTER — Ambulatory Visit: Payer: Medicare Other

## 2023-07-20 ENCOUNTER — Encounter: Payer: Self-pay | Admitting: Family Medicine

## 2023-07-20 ENCOUNTER — Telehealth: Payer: Self-pay | Admitting: Family Medicine

## 2023-07-20 DIAGNOSIS — M79602 Pain in left arm: Secondary | ICD-10-CM | POA: Insufficient documentation

## 2023-07-20 DIAGNOSIS — I10 Essential (primary) hypertension: Secondary | ICD-10-CM

## 2023-07-20 NOTE — Telephone Encounter (Signed)
Patient is aware that papers will be upfront in the bin

## 2023-07-20 NOTE — Telephone Encounter (Signed)
Copied from CRM [id number]. Topic: General - Other >> Jul 20, 2023 12:28 PM Phill Myron wrote: [name] [name] is calling to get an scat application and a stool sample kit.  Can she come over and pick it up. Please advise

## 2023-07-26 ENCOUNTER — Other Ambulatory Visit (HOSPITAL_COMMUNITY): Payer: Self-pay | Admitting: Cardiology

## 2023-07-26 DIAGNOSIS — M79602 Pain in left arm: Secondary | ICD-10-CM

## 2023-07-26 DIAGNOSIS — I25118 Atherosclerotic heart disease of native coronary artery with other forms of angina pectoris: Secondary | ICD-10-CM

## 2023-07-26 NOTE — Progress Notes (Signed)
Ordering 2 day stress test w/Lexiscan. Please arrange.  Thanks MJP

## 2023-08-01 ENCOUNTER — Other Ambulatory Visit: Payer: Medicare Other

## 2023-08-02 ENCOUNTER — Other Ambulatory Visit: Payer: Self-pay

## 2023-08-02 ENCOUNTER — Other Ambulatory Visit (HOSPITAL_COMMUNITY): Payer: Self-pay

## 2023-08-02 ENCOUNTER — Other Ambulatory Visit: Payer: Self-pay | Admitting: Cardiology

## 2023-08-02 DIAGNOSIS — I2089 Other forms of angina pectoris: Secondary | ICD-10-CM

## 2023-08-02 DIAGNOSIS — M79602 Pain in left arm: Secondary | ICD-10-CM

## 2023-08-02 DIAGNOSIS — R931 Abnormal findings on diagnostic imaging of heart and coronary circulation: Secondary | ICD-10-CM

## 2023-08-02 MED ORDER — METOPROLOL SUCCINATE ER 25 MG PO TB24
25.0000 mg | ORAL_TABLET | Freq: Every day | ORAL | 3 refills | Status: DC
  Filled 2023-08-02: qty 30, 30d supply, fill #0
  Filled 2023-08-31: qty 30, 30d supply, fill #1
  Filled 2023-09-12 – 2023-09-15 (×2): qty 30, 30d supply, fill #2

## 2023-08-03 ENCOUNTER — Other Ambulatory Visit: Payer: Self-pay | Admitting: Cardiology

## 2023-08-03 ENCOUNTER — Other Ambulatory Visit (HOSPITAL_COMMUNITY): Payer: Self-pay

## 2023-08-03 DIAGNOSIS — I2089 Other forms of angina pectoris: Secondary | ICD-10-CM

## 2023-08-03 DIAGNOSIS — R931 Abnormal findings on diagnostic imaging of heart and coronary circulation: Secondary | ICD-10-CM

## 2023-08-04 ENCOUNTER — Other Ambulatory Visit: Payer: Medicare Other

## 2023-08-05 ENCOUNTER — Telehealth (HOSPITAL_COMMUNITY): Payer: Self-pay | Admitting: *Deleted

## 2023-08-05 NOTE — Telephone Encounter (Signed)
Patient given detailed instructions per Myocardial Perfusion Study Information Sheet for the test on 12/09/2023 at 1:30. Patient notified to arrive 15 minutes early and that it is imperative to arrive on time for appointment to keep from having the test rescheduled.  If you need to cancel or reschedule your appointment, please call the office within 24 hours of your appointment. . Patient verbalized understanding.[name] [name]

## 2023-08-09 ENCOUNTER — Ambulatory Visit (HOSPITAL_COMMUNITY): Payer: Medicare Other | Attending: Cardiovascular Disease

## 2023-08-09 DIAGNOSIS — M79602 Pain in left arm: Secondary | ICD-10-CM | POA: Diagnosis not present

## 2023-08-09 DIAGNOSIS — I2089 Other forms of angina pectoris: Secondary | ICD-10-CM | POA: Diagnosis not present

## 2023-08-09 DIAGNOSIS — R931 Abnormal findings on diagnostic imaging of heart and coronary circulation: Secondary | ICD-10-CM

## 2023-08-09 LAB — MYOCARDIAL PERFUSION IMAGING
Peak HR: 86 {beats}/min
Rest HR: 67 {beats}/min
Stress Nuclear Isotope Dose: 32 mCi

## 2023-08-09 MED ORDER — REGADENOSON 0.4 MG/5ML IV SOLN
0.4000 mg | Freq: Once | INTRAVENOUS | Status: AC
Start: 2023-08-09 — End: 2023-08-09
  Administered 2023-08-09: 0.4 mg via INTRAVENOUS

## 2023-08-09 MED ORDER — TECHNETIUM TC 99M TETROFOSMIN IV KIT
32.0000 | PACK | Freq: Once | INTRAVENOUS | Status: AC | PRN
  Administered 2023-08-09: 32 via INTRAVENOUS

## 2023-08-10 ENCOUNTER — Ambulatory Visit (HOSPITAL_COMMUNITY): Payer: Medicare Other

## 2023-08-10 MED ORDER — TECHNETIUM TC 99M TETROFOSMIN IV KIT
29.4000 | PACK | Freq: Once | INTRAVENOUS | Status: AC | PRN
  Administered 2023-08-10: 29.4 via INTRAVENOUS

## 2023-08-16 ENCOUNTER — Other Ambulatory Visit (HOSPITAL_COMMUNITY): Payer: Self-pay

## 2023-08-16 ENCOUNTER — Other Ambulatory Visit: Payer: Self-pay

## 2023-08-16 ENCOUNTER — Telehealth: Payer: Self-pay | Admitting: Cardiology

## 2023-08-16 DIAGNOSIS — I25118 Atherosclerotic heart disease of native coronary artery with other forms of angina pectoris: Secondary | ICD-10-CM

## 2023-08-16 MED ORDER — SEMAGLUTIDE-WEIGHT MANAGEMENT 1 MG/0.5ML ~~LOC~~ SOAJ
1.0000 mg | SUBCUTANEOUS | 3 refills | Status: DC
Start: 2023-10-13 — End: 2023-09-15
  Filled 2023-08-16: qty 2, fill #0

## 2023-08-16 MED ORDER — SEMAGLUTIDE-WEIGHT MANAGEMENT 0.5 MG/0.5ML ~~LOC~~ SOAJ
0.5000 mg | SUBCUTANEOUS | 3 refills | Status: DC
Start: 2023-09-14 — End: 2023-09-15
  Filled 2023-08-16: qty 2, fill #0

## 2023-08-16 MED ORDER — SEMAGLUTIDE-WEIGHT MANAGEMENT 2.4 MG/0.75ML ~~LOC~~ SOAJ
2.4000 mg | SUBCUTANEOUS | 3 refills | Status: DC
Start: 2023-12-10 — End: 2023-09-15
  Filled 2023-08-16: qty 3, fill #0

## 2023-08-16 MED ORDER — SEMAGLUTIDE-WEIGHT MANAGEMENT 0.25 MG/0.5ML ~~LOC~~ SOAJ
0.2500 mg | SUBCUTANEOUS | 0 refills | Status: DC
Start: 2023-08-16 — End: 2023-09-15
  Filled 2023-08-16 – 2023-08-25 (×3): qty 2, 28d supply, fill #0

## 2023-08-16 MED ORDER — SEMAGLUTIDE-WEIGHT MANAGEMENT 1.7 MG/0.75ML ~~LOC~~ SOAJ
1.7000 mg | SUBCUTANEOUS | 3 refills | Status: DC
Start: 2023-11-11 — End: 2023-09-15
  Filled 2023-08-16: qty 3, fill #0

## 2023-08-16 NOTE — Telephone Encounter (Signed)
Pharmacological nuclear stress test 08/10/2023:  Findings are consistent with ischemia. The study is intermediate risk.   No ST deviation was noted.   LV perfusion is abnormal. Defect 1: There is a small defect with moderate reduction in uptake present in the mid to basal anteroseptal location(s) that is reversible. There is normal wall motion in the defect area. Consistent with ischemia.   Left ventricular function is abnormal. Global function is mildly reduced. Nuclear stress EF: 48%. The left ventricular ejection fraction is mildly decreased (45-54%). End diastolic cavity size is normal.   Prior study not available for comparison.    Discussed findings of stress test with the patient.  Since addition of metoprolol, heart arm pain has improved.  Back pain remains.  I am not convinced that her constant stabbing upper back pain is related to angina.  That said, left arm pain could have been angina equivalent, which is improved with metoprolol.  Continue the same.  On a separate note, patient is frustrated by her weight and wonders if she would be eligible for any weight loss medication.  She has tried diet and lifestyle medications without any change.  With suspected coronary artery disease, she would be hopefully be eligible for Ozempic.  I will start insurance authorization process.  On a separate note, patient tells me that she has difficulty cooking as she has weakness in both her arms, and ends up burning herself.  As a result, she is doing a lot of " convenient eating".  I recommended her to discuss this with PCP.  I am not sure why she has weakness in both arms, whether is related to shoulder or any spinal stenosis.   Elder Negus, MD Pager: [phone] Office: [phone]

## 2023-08-17 ENCOUNTER — Other Ambulatory Visit (HOSPITAL_COMMUNITY): Payer: Self-pay

## 2023-08-23 ENCOUNTER — Other Ambulatory Visit (HOSPITAL_COMMUNITY): Payer: Self-pay

## 2023-08-24 ENCOUNTER — Ambulatory Visit (INDEPENDENT_AMBULATORY_CARE_PROVIDER_SITE_OTHER): Payer: Medicare Other

## 2023-08-24 DIAGNOSIS — Z0000 Encounter for general adult medical examination without abnormal findings: Secondary | ICD-10-CM
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
## 2023-08-24 NOTE — Patient Instructions (Signed)
[name] [name] , Thank you for taking time to come for your Medicare Wellness Visit. I appreciate your ongoing commitment to your health goals. Please review the following plan we discussed and let me know if I can assist you in the future.   Referrals/Orders/Follow-Ups/Clinician Recommendations: having trouble sleeping  This is a list of the screening recommended for you and due dates:  Health Maintenance  Topic Date Due   COVID-19 Vaccine (1) Never done   HIV Screening  Never done   Hepatitis C Screening  Never done   Pap Smear  Never done   Colon Cancer Screening  Never done   Mammogram  Never done   Flu Shot  07/28/2023   Medicare Annual Wellness Visit  08/23/2024   DTaP/Tdap/Td vaccine (2 - Tdap) 01/21/2028   HPV Vaccine  Aged Out   Zoster (Shingles) Vaccine  Discontinued    Advanced directives: (Declined) Advance directive discussed with you today. Even though you declined this today, please call our office should you change your mind, and we can give you the proper paperwork for you to fill out.  Next Medicare Annual Wellness Visit scheduled for next year: No, schedule not opened for next year  insert Preventive Care Attachment Reference

## 2023-08-25 ENCOUNTER — Telehealth: Payer: Self-pay | Admitting: Family Medicine

## 2023-08-25 ENCOUNTER — Encounter: Payer: Self-pay | Admitting: Family Medicine

## 2023-08-25 ENCOUNTER — Ambulatory Visit (INDEPENDENT_AMBULATORY_CARE_PROVIDER_SITE_OTHER): Payer: Medicare Other | Admitting: Family Medicine

## 2023-08-25 ENCOUNTER — Other Ambulatory Visit (HOSPITAL_COMMUNITY): Payer: Self-pay

## 2023-08-25 VITALS — BP 113/63 | HR 77 | Temp 97.8°F | Resp 20 | Ht 62.0 in | Wt 362.6 lb

## 2023-08-25 DIAGNOSIS — Z6841 Body Mass Index (BMI) 40.0 and over, adult: Secondary | ICD-10-CM

## 2023-08-25 DIAGNOSIS — F514 Sleep terrors [night terrors]: Secondary | ICD-10-CM | POA: Diagnosis not present

## 2023-08-25 DIAGNOSIS — G4700 Insomnia, unspecified: Secondary | ICD-10-CM

## 2023-08-25 DIAGNOSIS — I10 Essential (primary) hypertension: Secondary | ICD-10-CM

## 2023-08-25 MED ORDER — QUETIAPINE FUMARATE 25 MG PO TABS
25.0000 mg | ORAL_TABLET | Freq: Every day | ORAL | 1 refills | Status: DC
  Filled 2023-08-25 (×2): qty 30, 30d supply, fill #0

## 2023-08-25 NOTE — Progress Notes (Signed)
Insomnia.  Patient sleeps 1.5 the she wakes back up.  Patient can sleep in the day time however not at night

## 2023-08-26 ENCOUNTER — Encounter: Payer: Self-pay | Admitting: Family Medicine

## 2023-08-26 ENCOUNTER — Telehealth: Payer: Self-pay | Admitting: Family Medicine
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
## 2023-08-26 NOTE — Telephone Encounter (Signed)
Copied from CRM [id number]. Topic: General - Other >> Aug 25, 2023  1:35 PM Ja-Kwan M wrote: Reason for CRM: Pt stated she forgot to mention to Dr. Andrey Campanile that she needs a new Rx for cyclobenzaprine (FLEXERIL) 5 MG tablet and she would like to request a different Rx for the naproxen (NAPROSYN) 500 MG tablet. Pt also stated that the Rx for [facility] needs a prior authorization.

## 2023-08-29 ENCOUNTER — Telehealth: Payer: Self-pay | Admitting: *Deleted

## 2023-08-29 ENCOUNTER — Other Ambulatory Visit: Payer: Self-pay | Admitting: *Deleted

## 2023-08-29 NOTE — Telephone Encounter (Signed)
Disp Refills Start End   cyclobenzaprine (FLEXERIL) 5 MG tablet        naproxen (NAPROSYN) 500 MG tablet

## 2023-08-30 ENCOUNTER — Other Ambulatory Visit (HOSPITAL_COMMUNITY): Payer: Self-pay

## 2023-08-30 ENCOUNTER — Other Ambulatory Visit: Payer: Self-pay

## 2023-08-30 ENCOUNTER — Other Ambulatory Visit: Payer: Self-pay | Admitting: Family Medicine

## 2023-08-30 MED ORDER — CYCLOBENZAPRINE HCL 5 MG PO TABS
5.0000 mg | ORAL_TABLET | Freq: Three times a day (TID) | ORAL | 1 refills | Status: DC | PRN
  Filled 2023-08-30: qty 30, 10d supply, fill #0
  Filled 2023-08-31: qty 30, 10d supply, fill #1

## 2023-08-31 ENCOUNTER — Other Ambulatory Visit: Payer: Self-pay

## 2023-08-31 ENCOUNTER — Other Ambulatory Visit (HOSPITAL_COMMUNITY): Payer: Self-pay

## 2023-09-02 DIAGNOSIS — Z4789 Encounter for other orthopedic aftercare: Secondary | ICD-10-CM | POA: Diagnosis not present

## 2023-09-02 DIAGNOSIS — R519 Headache, unspecified: Secondary | ICD-10-CM | POA: Diagnosis not present

## 2023-09-02 DIAGNOSIS — M542 Cervicalgia: Secondary | ICD-10-CM | POA: Diagnosis not present

## 2023-09-02 DIAGNOSIS — W1830XA Fall on same level, unspecified, initial encounter: Secondary | ICD-10-CM | POA: Diagnosis not present

## 2023-09-02 DIAGNOSIS — W19XXXA Unspecified fall, initial encounter: Secondary | ICD-10-CM | POA: Diagnosis not present

## 2023-09-02 DIAGNOSIS — M549 Dorsalgia, unspecified: Secondary | ICD-10-CM | POA: Diagnosis not present

## 2023-09-02 DIAGNOSIS — Z981 Arthrodesis status: Secondary | ICD-10-CM | POA: Diagnosis not present

## 2023-09-02 DIAGNOSIS — S0990XA Unspecified injury of head, initial encounter: Secondary | ICD-10-CM | POA: Diagnosis not present

## 2023-09-02 DIAGNOSIS — I700 Atherosclerosis of aorta: Secondary | ICD-10-CM | POA: Diagnosis not present

## 2023-09-02 DIAGNOSIS — M4802 Spinal stenosis, cervical region: Secondary | ICD-10-CM | POA: Diagnosis not present

## 2023-09-02 DIAGNOSIS — S8012XA Contusion of left lower leg, initial encounter: Secondary | ICD-10-CM | POA: Diagnosis not present

## 2023-09-02 DIAGNOSIS — R110 Nausea: Secondary | ICD-10-CM | POA: Diagnosis not present

## 2023-09-09 ENCOUNTER — Other Ambulatory Visit (HOSPITAL_COMMUNITY): Payer: Self-pay

## 2023-09-12 ENCOUNTER — Other Ambulatory Visit: Payer: Self-pay

## 2023-09-12 ENCOUNTER — Ambulatory Visit: Payer: Self-pay | Admitting: *Deleted

## 2023-09-12 ENCOUNTER — Other Ambulatory Visit (HOSPITAL_COMMUNITY): Payer: Self-pay

## 2023-09-12 MED ORDER — HYDROCHLOROTHIAZIDE 50 MG PO TABS
50.0000 mg | ORAL_TABLET | ORAL | 1 refills | Status: DC
  Filled 2023-09-12 – 2023-12-02 (×3): qty 90, 90d supply, fill #0
  Filled 2024-02-25: qty 90, 90d supply, fill #1

## 2023-09-12 MED ORDER — HYDROCHLOROTHIAZIDE 50 MG PO TABS
50.0000 mg | ORAL_TABLET | Freq: Every day | ORAL | 1 refills | Status: DC | PRN
  Filled 2023-09-12: qty 90, 90d supply, fill #0
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
## 2023-09-12 NOTE — Telephone Encounter (Signed)
Pt scheduled for 09/19.

## 2023-09-12 NOTE — Telephone Encounter (Signed)
Ok to DB for fall

## 2023-09-15 ENCOUNTER — Telehealth: Payer: Self-pay | Admitting: Family Medicine

## 2023-09-15 ENCOUNTER — Other Ambulatory Visit: Payer: Self-pay

## 2023-09-15 ENCOUNTER — Ambulatory Visit (INDEPENDENT_AMBULATORY_CARE_PROVIDER_SITE_OTHER): Payer: Medicare HMO | Admitting: Family Medicine

## 2023-09-15 ENCOUNTER — Telehealth: Payer: Self-pay | Admitting: *Deleted

## 2023-09-15 ENCOUNTER — Other Ambulatory Visit (HOSPITAL_COMMUNITY): Payer: Self-pay

## 2023-09-15 VITALS — BP 94/67 | HR 69 | Temp 98.1°F | Resp 20 | Wt 367.0 lb

## 2023-09-15 DIAGNOSIS — I10 Essential (primary) hypertension: Secondary | ICD-10-CM | POA: Diagnosis not present

## 2023-09-15 DIAGNOSIS — G4700 Insomnia, unspecified: Secondary | ICD-10-CM | POA: Diagnosis not present

## 2023-09-15 DIAGNOSIS — Z23 Encounter for immunization: Secondary | ICD-10-CM | POA: Diagnosis not present

## 2023-09-15 DIAGNOSIS — M25552 Pain in left hip: Secondary | ICD-10-CM

## 2023-09-15 DIAGNOSIS — Z6841 Body Mass Index (BMI) 40.0 and over, adult: Secondary | ICD-10-CM

## 2023-09-15 MED ORDER — METOPROLOL SUCCINATE ER 25 MG PO TB24
25.0000 mg | ORAL_TABLET | Freq: Every day | ORAL | 3 refills | Status: DC
  Filled 2023-09-15 – 2023-09-23 (×2): qty 30, 30d supply, fill #0
  Filled 2023-11-07: qty 30, 30d supply, fill #1
  Filled 2023-12-02: qty 30, 30d supply, fill #2
  Filled 2024-01-02: qty 30, 30d supply, fill #3

## 2023-09-15 MED ORDER — METHYLPREDNISOLONE 4 MG PO TBPK
ORAL_TABLET | ORAL | 0 refills | Status: DC
  Filled 2023-09-15 – 2023-09-16 (×2): qty 21, 6d supply, fill #0

## 2023-09-15 MED ORDER — DICLOFENAC SODIUM 75 MG PO TBEC
75.0000 mg | DELAYED_RELEASE_TABLET | Freq: Two times a day (BID) | ORAL | 0 refills | Status: DC
  Filled 2023-09-15 – 2023-09-16 (×2): qty 60, 30d supply, fill #0

## 2023-09-15 NOTE — Telephone Encounter (Signed)
Patient is calling in to let Dr. Andrey Campanile know [facility] needs her to resubmit the prescription for pt's weight loss shot because [employer] will cover it.

## 2023-09-16 ENCOUNTER — Other Ambulatory Visit (HOSPITAL_COMMUNITY): Payer: Self-pay

## 2023-09-16 ENCOUNTER — Other Ambulatory Visit: Payer: Self-pay

## 2023-09-16 ENCOUNTER — Other Ambulatory Visit: Payer: Self-pay | Admitting: Family Medicine

## 2023-09-16 MED ORDER — TIRZEPATIDE-WEIGHT MANAGEMENT 2.5 MG/0.5ML ~~LOC~~ SOAJ
2.5000 mg | SUBCUTANEOUS | 0 refills | Status: DC
  Filled 2023-09-16: qty 2, 28d supply, fill #0

## 2023-09-17 ENCOUNTER — Other Ambulatory Visit (HOSPITAL_COMMUNITY): Payer: Self-pay

## 2023-09-19 ENCOUNTER — Encounter: Payer: Self-pay | Admitting: Family Medicine

## 2023-09-19 ENCOUNTER — Other Ambulatory Visit (HOSPITAL_COMMUNITY): Payer: Self-pay

## 2023-09-19 NOTE — Telephone Encounter (Addendum)
Pt called in, third request states needs Dr Andrey Campanile to change form of medication from a valve to a pen., looks like, Zepbound she is referring too. She says she has been tring to get this for 3 months.

## 2023-09-21 ENCOUNTER — Telehealth: Payer: Self-pay | Admitting: *Deleted

## 2023-09-21 ENCOUNTER — Other Ambulatory Visit (HOSPITAL_COMMUNITY): Payer: Self-pay

## 2023-09-21 ENCOUNTER — Ambulatory Visit: Payer: Self-pay | Admitting: *Deleted

## 2023-09-21 ENCOUNTER — Other Ambulatory Visit: Payer: Self-pay | Admitting: Family Medicine

## 2023-09-21 MED ORDER — OZEMPIC (0.25 OR 0.5 MG/DOSE) 2 MG/3ML ~~LOC~~ SOPN
0.2500 mg | PEN_INJECTOR | SUBCUTANEOUS | 0 refills | Status: DC
  Filled 2023-09-21: qty 3, 28d supply, fill #0
  Filled 2023-09-22 (×2): qty 3, 56d supply, fill #0

## 2023-09-21 NOTE — Telephone Encounter (Signed)
Sent TE to provider.

## 2023-09-21 NOTE — Telephone Encounter (Signed)
Pt called in, third request states needs Dr Andrey Campanile to change form of medication from a valve to a pen., looks like, Zepbound she is referring too. She says she has been tring to get this for 3 months
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
## 2023-09-21 NOTE — Telephone Encounter (Signed)
Pt is calling back stating that the pharmacy advised her that her insurance will cover Ozempic Pen.  Please advise.    [facility]  Phone: [phone] Fax: [phone]

## 2023-09-22 ENCOUNTER — Other Ambulatory Visit (HOSPITAL_COMMUNITY): Payer: Self-pay

## 2023-09-22 NOTE — Telephone Encounter (Signed)
I spoke with patient and medication should be filled and mailed to her resident

## 2023-09-23 ENCOUNTER — Ambulatory Visit: Payer: Medicare Other | Admitting: Cardiology

## 2023-09-23 ENCOUNTER — Other Ambulatory Visit (HOSPITAL_COMMUNITY): Payer: Self-pay

## 2023-09-29 ENCOUNTER — Ambulatory Visit: Payer: Medicare Other | Admitting: Family Medicine

## 2023-09-30 ENCOUNTER — Other Ambulatory Visit: Payer: Self-pay | Admitting: Family Medicine

## 2023-09-30 DIAGNOSIS — Z1212 Encounter for screening for malignant neoplasm of rectum: Secondary | ICD-10-CM

## 2023-09-30 DIAGNOSIS — Z1211 Encounter for screening for malignant neoplasm of colon: Secondary | ICD-10-CM

## 2023-10-02 ENCOUNTER — Other Ambulatory Visit (HOSPITAL_COMMUNITY): Payer: Self-pay

## 2023-10-03 ENCOUNTER — Other Ambulatory Visit: Payer: Self-pay

## 2023-10-03 ENCOUNTER — Other Ambulatory Visit (HOSPITAL_COMMUNITY): Payer: Self-pay

## 2023-10-03 MED ORDER — MIRABEGRON ER 50 MG PO TB24
50.0000 mg | ORAL_TABLET | Freq: Every day | ORAL | 1 refills | Status: DC
  Filled 2023-10-03: qty 90, 90d supply, fill #0
  Filled 2024-01-02: qty 90, 90d supply, fill #1
  Filled 2024-03-25: qty 20, 20d supply, fill #2

## 2023-10-06 ENCOUNTER — Telehealth: Payer: Self-pay | Admitting: Family Medicine
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
## 2023-10-10 ENCOUNTER — Ambulatory Visit (INDEPENDENT_AMBULATORY_CARE_PROVIDER_SITE_OTHER): Payer: Medicare HMO | Admitting: Family Medicine

## 2023-10-10 ENCOUNTER — Other Ambulatory Visit (HOSPITAL_COMMUNITY): Payer: Self-pay

## 2023-10-10 ENCOUNTER — Encounter: Payer: Self-pay | Admitting: Family Medicine

## 2023-10-10 VITALS — BP 106/70 | HR 65 | Temp 97.7°F | Resp 18 | Ht 62.0 in | Wt 358.6 lb

## 2023-10-10 DIAGNOSIS — M25552 Pain in left hip: Secondary | ICD-10-CM | POA: Diagnosis not present

## 2023-10-10 DIAGNOSIS — E66813 Obesity, class 3: Secondary | ICD-10-CM

## 2023-10-10 DIAGNOSIS — G8929 Other chronic pain: Secondary | ICD-10-CM

## 2023-10-10 DIAGNOSIS — Z1212 Encounter for screening for malignant neoplasm of rectum: Secondary | ICD-10-CM | POA: Diagnosis not present

## 2023-10-10 DIAGNOSIS — Z6841 Body Mass Index (BMI) 40.0 and over, adult: Secondary | ICD-10-CM | POA: Diagnosis not present

## 2023-10-10 DIAGNOSIS — M25551 Pain in right hip: Secondary | ICD-10-CM | POA: Diagnosis not present

## 2023-10-10 DIAGNOSIS — M25511 Pain in right shoulder: Secondary | ICD-10-CM

## 2023-10-10 DIAGNOSIS — Z1211 Encounter for screening for malignant neoplasm of colon: Secondary | ICD-10-CM | POA: Diagnosis not present

## 2023-10-10 MED ORDER — OZEMPIC (0.25 OR 0.5 MG/DOSE) 2 MG/3ML ~~LOC~~ SOPN
0.2500 mg | PEN_INJECTOR | SUBCUTANEOUS | 0 refills | Status: DC
  Filled 2023-10-10 – 2023-10-12 (×2): qty 3, 56d supply, fill #0

## 2023-10-10 MED ORDER — TRIAMCINOLONE ACETONIDE 40 MG/ML IJ SUSP
40.0000 mg | Freq: Once | INTRAMUSCULAR | Status: AC
Start: 2023-10-10 — End: 2023-10-10
  Administered 2023-10-10: 40 mg via INTRAMUSCULAR
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
## 2023-10-12 ENCOUNTER — Encounter: Payer: Self-pay | Admitting: Family Medicine

## 2023-10-12 ENCOUNTER — Telehealth: Payer: Self-pay | Admitting: Family Medicine

## 2023-10-12 ENCOUNTER — Other Ambulatory Visit (HOSPITAL_COMMUNITY): Payer: Self-pay

## 2023-10-12 NOTE — Telephone Encounter (Signed)
Copied from CRM [id number]. Topic: General - Inquiry >> Oct 12, 2023  1:49 PM Marlow Baars wrote: Reason for CRM: The patient states she didn't realize she was not supposed to throw away the pen for her Ozempic needles. She thinks its her dyslexia when she read the instructions. Please assist patient further as she doesn't know what else to do and is scared she will be without her medicine

## 2023-10-13 ENCOUNTER — Other Ambulatory Visit: Payer: Self-pay

## 2023-10-16 LAB — COLOGUARD: COLOGUARD: NEGATIVE

## 2023-10-17 ENCOUNTER — Other Ambulatory Visit (HOSPITAL_COMMUNITY): Payer: Self-pay

## 2023-10-17 ENCOUNTER — Other Ambulatory Visit: Payer: Self-pay

## 2023-10-17 ENCOUNTER — Other Ambulatory Visit: Payer: Self-pay | Admitting: Family

## 2023-10-17 DIAGNOSIS — E66813 Obesity, class 3: Secondary | ICD-10-CM

## 2023-10-17 MED ORDER — OZEMPIC (0.25 OR 0.5 MG/DOSE) 2 MG/3ML ~~LOC~~ SOPN
0.2500 mg | PEN_INJECTOR | SUBCUTANEOUS | 0 refills | Status: DC
Start: 2023-10-17 — End: 2024-02-22
  Filled 2023-10-17 – 2023-10-18 (×2): qty 3, 56d supply, fill #0

## 2023-10-17 NOTE — Telephone Encounter (Signed)
Complete

## 2023-10-17 NOTE — Telephone Encounter (Signed)
Amy, will you ok a refill or should  she just wait

## 2023-10-17 NOTE — Telephone Encounter (Signed)
Please advise 

## 2023-10-17 NOTE — Telephone Encounter (Signed)
Thank you :)

## 2023-10-18 ENCOUNTER — Other Ambulatory Visit: Payer: Self-pay

## 2023-10-18 ENCOUNTER — Other Ambulatory Visit (HOSPITAL_COMMUNITY): Payer: Self-pay

## 2023-10-18 ENCOUNTER — Telehealth: Payer: Self-pay | Admitting: *Deleted

## 2023-10-18 ENCOUNTER — Ambulatory Visit: Payer: Medicare Other | Admitting: Family Medicine

## 2023-10-18 NOTE — Telephone Encounter (Signed)
Patient medication has been sent through  by pharmacy tech Tresa Endo and ready to be mail out. Patient is aware

## 2023-10-20 ENCOUNTER — Ambulatory Visit: Payer: Medicare Other | Admitting: Family Medicine

## 2023-11-07 ENCOUNTER — Other Ambulatory Visit: Payer: Self-pay

## 2023-11-07 ENCOUNTER — Other Ambulatory Visit (HOSPITAL_COMMUNITY): Payer: Self-pay

## 2023-11-07 MED ORDER — PANCRELIPASE (LIP-PROT-AMYL) 36000-114000 UNITS PO CPEP
36000.0000 [IU] | ORAL_CAPSULE | Freq: Four times a day (QID) | ORAL | 0 refills | Status: DC | PRN
  Filled 2023-11-07: qty 300, 75d supply, fill #0
  Filled 2023-12-02 – 2024-02-28 (×2): qty 300, 75d supply, fill #1
  Filled 2024-02-28: qty 60, 15d supply, fill #1

## 2023-11-16 ENCOUNTER — Ambulatory Visit: Payer: Self-pay | Admitting: *Deleted

## 2023-11-16 NOTE — Telephone Encounter (Signed)
  Chief Complaint: lower buttock/back pain  Symptoms: injury due [place]- patient states injection did not really help- she is still having pain. Would like to know next steps for addressing hip pain Frequency: ongoing over 1 month  Disposition: [] ED /[] Urgent Care (no appt availability in office) / [] Appointment(In office/virtual)/ []  [place] [facility]/ [] Home Care/ [] Refused Recommended Disposition /[] [place] [facility]/ [x]  Follow-up with PCP Additional Notes: Patient would like to know what next steps would be in evaluation of hip pain- she is still having severe pain in buttock/and groin at times. Patient states the injection did not help. Patient would like x -ray to check hip and also to make plan for next steps to manage her pain. Patient is in wheelchair and is asking for any studies needed to be ordered before visit.
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
## 2023-12-01 ENCOUNTER — Telehealth: Payer: Self-pay | Admitting: Family Medicine

## 2023-12-01 NOTE — Telephone Encounter (Signed)
Copied from CRM [id number]. Topic: General - Inquiry >> Dec 01, 2023  9:58 AM Haroldine Laws wrote: Reason for CRM: pt called asking for provider to order an xray for her right butt cheek, tail bone area.  She said the shot is not helping.  She needs a nurse to call back.  CB@ [phone]

## 2023-12-02 ENCOUNTER — Other Ambulatory Visit (HOSPITAL_COMMUNITY): Payer: Self-pay

## 2023-12-02 ENCOUNTER — Ambulatory Visit: Payer: Self-pay | Admitting: *Deleted

## 2023-12-02 ENCOUNTER — Other Ambulatory Visit: Payer: Self-pay

## 2023-12-02 NOTE — Telephone Encounter (Signed)
Summary: butt cheek pain   pt called asking for provider to order an xray for her right butt cheek, tail bone area as there is a constant pain.  She said the shot is not helping. Please f/u with patient         Patient is very upset that she has not heard back from provider- she called on 11/20 and yesterday and has had no response.   Patient also states she has not lost any weight on the Ozempic and her [employer] representative has advised she needs increased dose. It is time for a refill and she is requesting a new Rx with increased dosing.     Reason for Disposition  Caller has already spoken with another triager and has no further questions.  Answer Assessment - Initial Assessment Questions 1. REASON FOR CALL or QUESTION: "What is your reason for calling today?" or "How can I best help you?" or "What question do you have that I can help answer?"     Follow up call to 3 previous calls- patient is upset that she did not get response back from provider. Patient states she is a new patient and she is going to have to find a another provider.  Protocols used: Information Only Call - No Triage-A-AH, No Contact or Duplicate Contact Call-A-AH

## 2023-12-02 NOTE — Telephone Encounter (Signed)
  Chief Complaint: Hip pain- follow up call Symptoms: hip pain from fall - patient is requesting x ray. Frequency: patient called 11/16/23- did not call back from office  Disposition: [] ED /[] Urgent Care (no appt availability in office) / [] Appointment(In office/virtual)/ []  [facility]/ [] Home Care/ [] Refused Recommended Disposition /[] [place] [facility]/ [x]  Follow-up with PCP Additional Notes: Patient called 11/20 and did not get response from her provider- patient is requesting X-ray from fall- she is in [place] and [place] [employer]- she did get injection which did not work to help with pain. Patient also wants new Rx dose for Ozempic- she has not lost weight and her insurnace is recommending increased dose.

## 2023-12-05 ENCOUNTER — Encounter (HOSPITAL_COMMUNITY): Payer: Self-pay

## 2023-12-05 ENCOUNTER — Telehealth: Payer: Medicare HMO | Admitting: Family Medicine

## 2023-12-05 ENCOUNTER — Other Ambulatory Visit (HOSPITAL_COMMUNITY): Payer: Self-pay

## 2023-12-05 ENCOUNTER — Other Ambulatory Visit: Payer: Self-pay

## 2023-12-05 DIAGNOSIS — M545 Low back pain, unspecified: Secondary | ICD-10-CM

## 2023-12-05 DIAGNOSIS — Z6841 Body Mass Index (BMI) 40.0 and over, adult: Secondary | ICD-10-CM

## 2023-12-05 DIAGNOSIS — E66813 Obesity, class 3: Secondary | ICD-10-CM | POA: Diagnosis not present

## 2023-12-05 DIAGNOSIS — M25552 Pain in left hip: Secondary | ICD-10-CM | POA: Diagnosis not present

## 2023-12-05 MED ORDER — TRAMADOL HCL 50 MG PO TABS
50.0000 mg | ORAL_TABLET | Freq: Two times a day (BID) | ORAL | 0 refills | Status: DC
  Filled 2023-12-05: qty 60, 30d supply, fill #0

## 2023-12-05 MED ORDER — SEMAGLUTIDE (1 MG/DOSE) 4 MG/3ML ~~LOC~~ SOPN
1.0000 mg | PEN_INJECTOR | SUBCUTANEOUS | 0 refills | Status: DC
  Filled 2023-12-05 – 2023-12-12 (×2): qty 3, 28d supply, fill #0

## 2023-12-06 ENCOUNTER — Other Ambulatory Visit: Payer: Self-pay

## 2023-12-06 ENCOUNTER — Other Ambulatory Visit (HOSPITAL_COMMUNITY): Payer: Self-pay

## 2023-12-07 ENCOUNTER — Encounter: Payer: Self-pay | Admitting: Family Medicine

## 2023-12-07 NOTE — Progress Notes (Signed)
Virtual Visit via Video Note  I connected with [name] [name] on 12/07/23 at  8:20 AM EST by a video enabled telemedicine application and verified that I am speaking with the correct person using two identifiers.  Location: Patient: [place] Provider: [place]   I discussed the limitations of evaluation and management by telemedicine and the availability of in person appointments. The patient expressed understanding and agreed to proceed.  History of Present Illness: Patient complains of persistent lower back and sacral pain and would like something more for pain control. Patient also would like to start weight loss to help her sx.    Observations/Objective:   Assessment and Plan: 1. Class 3 severe obesity due to excess calories with serious comorbidity and body mass index (BMI) of 60.0 to 69.9 in adult [facility]) Increased amount of wegovy prescribed to 1 mg/wk  2. Midline low back pain, unspecified chronicity, unspecified whether sciatica present Tramadol prescribed  3. Left hip pain As above    Follow Up Instructions:    I discussed the assessment and treatment plan with the patient. The patient was provided an opportunity to ask questions and all were answered. The patient agreed with the plan and demonstrated an understanding of the instructions.   The patient was advised to call back or seek an in-person evaluation if the symptoms worsen or if the condition fails to improve as anticipated.  I provided 15 minutes of non-face-to-face time during this encounter.   [name], MD

## 2023-12-09 ENCOUNTER — Other Ambulatory Visit (HOSPITAL_COMMUNITY): Payer: Self-pay

## 2023-12-09 ENCOUNTER — Telehealth: Payer: Self-pay | Admitting: Family Medicine

## 2023-12-09 ENCOUNTER — Other Ambulatory Visit: Payer: Self-pay

## 2023-12-09 NOTE — Telephone Encounter (Signed)
Patient is calling in with their insurance because the pharmacy says the office did not send the ICD10 code when they sent in the prescription for Semaglutide,0.25 or 0.5MG /DOS, (OZEMPIC, 0.25 OR 0.5 MG/DOSE,) 2 MG/3ML SOPN [[id number]] pt is due for her next shot on Monday 12/12/23 and she doesn't want to miss it due to health concerns. Pt is requesting someone give her a call once it has been sent over.

## 2023-12-09 NOTE — Telephone Encounter (Signed)
I spoke with patient and we are going to try and help her get a ICD code from cardiologist

## 2023-12-12 ENCOUNTER — Other Ambulatory Visit: Payer: Self-pay

## 2023-12-12 ENCOUNTER — Telehealth: Payer: Self-pay | Admitting: Cardiology

## 2023-12-12 ENCOUNTER — Other Ambulatory Visit (HOSPITAL_COMMUNITY): Payer: Self-pay

## 2023-12-12 NOTE — Telephone Encounter (Signed)
Pt c/o medication issue:  1. Name of Medication:   Semaglutide, 1 MG/DOSE, 4 MG/3ML SOPN   2. How are you currently taking this medication (dosage and times per day)?  As prescribed  3. Are you having a reaction (difficulty breathing--STAT)?   4. What is your medication issue?   Patient stated she is now out of this medication and needs a refill but her insurance is requesting an "OS number".  Patient stated she will need assistance getting this medication and wants a call back as she is supposed to take this medication today (12/16).

## 2023-12-12 NOTE — Telephone Encounter (Signed)
Pt called in checking status of PA for the Ozempic, states she needs right away

## 2023-12-12 NOTE — Telephone Encounter (Signed)
Returned a call back to the pt.   Pt states her PCP Dr. Andrey Campanile started her on Ozempic back in Oct and recently increased the dose to 1 mg/dose on 12/9.  Pt states on 12/9 Dr. Andrey Campanile increased her Ozempic to 1 mg/dose, which is requiring a new PA to be initiated through her insurance carrier.   Pt states she since 12/9, she has been back and forth with PCP about the PA needed for increased dose, so that she can start her new dose/injection on today 12/16.   Pt states the PCP has not completed the PA and her injection is due today.  Pt states she called her PCP office today and they advised to get ICD 10 code from our office for completion of PA and to receive the injection for the new dose.   Advised the pt that Dr. Rosemary Holms cannot complete the PA for her Ozempic, being he did not prescribed this medication for a cardiac need.  Pt aware that the PA for increased dose has to come from the prescribing Physicians office to complete.    Pt states she was also very confused that her PCP office would direct her to Dr. Rosemary Holms for the PA, being he never prescribed the medication for a cardiac need.  Pt states even at last OV with Dr. Rosemary Holms, she mentioned starting this medication, and he advised for her to have this done by her PCP.  Looks like Dr. Andrey Campanile did a virtual on the pt on 12/9, where increased amount of this medication was initiated for class 3 severe obesity, midline low back pain, and left hip pain.   Advised the pt that she needs to call her PCP office tomorrow and ask for assistance from Dr. Tawana Scale RN or whomever does the PA's for this medication at her office.  Pt is aware that I will cc Dr. Andrey Campanile and her team in on this message, so that they are aware she will be touching base with them tomorrow to seek further assistance with Ozempic PA.   Pt verbalized understanding and agrees with this plan.  She will call them first thing tomorrow morning.

## 2023-12-12 NOTE — Telephone Encounter (Signed)
Received call from pt regarding her semaglutide.  She has not heard that the prescription has been filled and is quite frustrated.  Pt has been in communication with the clinic without resolution.  Pt is due for her shot today. Pt is unable to contact her cardiologist as that office is closed. Pt needs a call back today with update on Rx. Please advise.

## 2023-12-12 NOTE — Telephone Encounter (Signed)
Patient was tfxer to me and I gave her the number to her cardiologists office and called them as well. Per provider she needs to let her cardiologist send PA for Ozempic. Patient is aware of Dr. Andrey Campanile suggestion.

## 2023-12-13 ENCOUNTER — Other Ambulatory Visit: Payer: Self-pay

## 2023-12-13 NOTE — Telephone Encounter (Signed)
Angelica can you follow-up on this. Prior-auth was denied due to needing ICD number according to [facility]. Patient is having a hard time with getting her medication  from due to her not being diabetic. Can you please look into?

## 2023-12-14 ENCOUNTER — Telehealth: Payer: Self-pay

## 2023-12-14 ENCOUNTER — Other Ambulatory Visit: Payer: Self-pay

## 2023-12-14 ENCOUNTER — Telehealth: Payer: Self-pay | Admitting: Family Medicine

## 2023-12-14 ENCOUNTER — Other Ambulatory Visit (HOSPITAL_COMMUNITY): Payer: Self-pay

## 2023-12-14 NOTE — Telephone Encounter (Signed)
Good morning [name] [name], can you help me with this prior auth please.  Ozempic (1 MG/Dose)  Thank you.

## 2023-12-14 NOTE — Telephone Encounter (Signed)
Pharmacy Patient Advocate Encounter   Received notification from CoverMyMeds that prior authorization for [facility] is required/requested.   Insurance verification completed.   The patient is insured through [place] .   Per test claim: PA required; PA submitted to above mentioned insurance via CoverMyMeds Key/confirmation #/EOC Key: [id number] Status is pending

## 2023-12-14 NOTE — Telephone Encounter (Signed)
Copied from CRM [id number]. Topic: General - Other >> Dec 14, 2023 11:41 AM Macon Large wrote: Reason for CRM: Pt reports that her pharmacy informed her that the Rx for Semaglutide, 1 MG/DOSE, 4 MG/3ML SOPN was received on 12/05/23 but it was denied by her insurance. Pt stated that a Rx for [facility] should have been sent instead but it wasn't so she has just been waiting. Pt then stated that the nurse at her cardiologist office informed her that [facility] sent the Rx to their office and she had no business doing so. Pt requests that a message be sent to Dr. Andrey Campanile to make her aware of how this is being bounced around and she still has not received a call back as requested previously.

## 2023-12-14 NOTE — Telephone Encounter (Signed)
Patient called requested to speak with Practice Admin per her medication Ozempic has not been filled yet. Please f/u with patient to let her know what the next steps are in this auth process.

## 2023-12-15 ENCOUNTER — Other Ambulatory Visit: Payer: Self-pay

## 2023-12-15 ENCOUNTER — Other Ambulatory Visit: Payer: Self-pay | Admitting: Family Medicine

## 2023-12-15 MED ORDER — WEGOVY 1 MG/0.5ML ~~LOC~~ SOAJ
1.0000 mg | SUBCUTANEOUS | 0 refills | Status: DC
  Filled 2023-12-15: qty 2, 28d supply, fill #0

## 2023-12-15 NOTE — Telephone Encounter (Signed)
I just called Mrs. Thatcher and we are trying to figure out transportation for her to come in person for a office visit if required for weight check

## 2023-12-15 NOTE — Telephone Encounter (Addendum)
SCAT application has been printed out and handed to practice manager Blair Promise

## 2023-12-19 NOTE — Telephone Encounter (Signed)
Good Morning Dr. Andrey Campanile,  I wanted to update you regarding the patient's request for [facility]. I sent a message to her on Friday afternoon informing her of the denial. I advised that we try again after the new year to see if there are any changes to her insurance's formulary policy.  Thank you for your attention to this matter.

## 2023-12-22 ENCOUNTER — Ambulatory Visit: Payer: Medicare HMO | Admitting: Family Medicine

## 2023-12-27 ENCOUNTER — Other Ambulatory Visit: Payer: Self-pay

## 2024-01-02 ENCOUNTER — Other Ambulatory Visit: Payer: Self-pay

## 2024-01-09 ENCOUNTER — Ambulatory Visit: Payer: Medicare HMO | Admitting: Cardiology

## 2024-01-24 ENCOUNTER — Other Ambulatory Visit: Payer: Self-pay

## 2024-01-24 ENCOUNTER — Other Ambulatory Visit (HOSPITAL_COMMUNITY): Payer: Self-pay

## 2024-01-24 MED ORDER — LISINOPRIL 20 MG PO TABS
20.0000 mg | ORAL_TABLET | Freq: Every day | ORAL | 0 refills | Status: DC
  Filled 2024-01-24: qty 90, 90d supply, fill #0
  Filled 2024-05-22: qty 90, 90d supply, fill #1
  Filled 2024-05-26: qty 10, 10d supply, fill #1

## 2024-01-29 ENCOUNTER — Other Ambulatory Visit: Payer: Self-pay | Admitting: Family Medicine

## 2024-02-03 ENCOUNTER — Other Ambulatory Visit: Payer: Self-pay

## 2024-02-03 MED ORDER — METOPROLOL SUCCINATE ER 25 MG PO TB24
25.0000 mg | ORAL_TABLET | Freq: Every day | ORAL | 3 refills | Status: DC
  Filled 2024-02-03: qty 30, 30d supply, fill #0
  Filled 2024-02-28 (×2): qty 30, 30d supply, fill #1
  Filled 2024-03-29: qty 30, 30d supply, fill #2
  Filled 2024-05-03: qty 30, 30d supply, fill #3

## 2024-02-15 ENCOUNTER — Other Ambulatory Visit (HOSPITAL_COMMUNITY): Payer: Self-pay

## 2024-02-15 ENCOUNTER — Other Ambulatory Visit: Payer: Self-pay | Admitting: Family

## 2024-02-15 ENCOUNTER — Other Ambulatory Visit: Payer: Self-pay

## 2024-02-15 DIAGNOSIS — E66813 Obesity, class 3: Secondary | ICD-10-CM

## 2024-02-21 ENCOUNTER — Other Ambulatory Visit (HOSPITAL_COMMUNITY): Payer: Self-pay

## 2024-02-21 ENCOUNTER — Ambulatory Visit: Payer: Self-pay | Admitting: Family Medicine

## 2024-02-21 NOTE — Telephone Encounter (Signed)
 Sarajane Jews, RN - spoke with pt & agreed to bring wheelchair outside to car when patient if appt scheduled: pt stated agreed to transfer self.  After getting off phone to attempt to schedule appt: pt's neighbor came over with pulse ox & stated O2 @ 82%: informed pt that I would no longer be making appt: pt would need to go to ER or call 911.  Pt agreed to nurse calling 911 for patient: I called 911 & gave information to EMS & they stated will send someone out now.

## 2024-02-21 NOTE — Telephone Encounter (Signed)
 Copied from CRM [id number]. Topic: Clinical - Red Word Triage >> Feb 21, 2024  3:14 PM [name] [name] wrote: Red Word that prompted transfer to Nurse Triage: COPD acting up SOB Need inhaler  Chief Complaint: difficulty breathing Symptoms: SOB w/exertion & sitting occurring last few days but constant today, wheezing O2 @ 82% RA Frequency: x few days Pertinent Negatives: Patient denies chest pain, dizziness Disposition: [x] ED /[] Urgent Care (no appt availability in office) / [] Appointment(In office/virtual)/ []  [place] [facility]/ [] Home Care/ [] Refused Recommended Disposition /[]  Mobile Bus/ []  Follow-up with PCP Additional Notes: need refill on asthma inhaler due to expire end of this month.  Patient heard speaking in fragments: requested O2 stats.  Neighbor checkout O2@82  % RA.  911 was called by Triage nurse to pt location: in patient appt no made.  Called back to check on pt: no answer: left voicemail r/t not booking appt on 02/22/24 due to triage ended in 911 call. Reason for Disposition  [1] MILD difficulty breathing (e.g., minimal/no SOB at rest, SOB with walking, pulse <100) AND [2] NEW-onset or WORSE than normal  Answer Assessment - Initial Assessment Questions 1. RESPIRATORY STATUS: "Describe your breathing?" (e.g., wheezing, shortness of breath, unable to speak, severe coughing)      SOB flare due to COPD flare up 2. ONSET: "When did this breathing problem begin?"      X last few days and worse today 3. PATTERN "Does the difficult breathing come and go, or has it been constant since it started?"      Constant  4. SEVERITY: "How bad is your breathing?" (e.g., mild, moderate, severe)    - MILD: No SOB at rest, mild SOB with walking, speaks normally in sentences, can lie down, no retractions, pulse < 100.    - MODERATE: SOB at rest, SOB with minimal exertion and prefers to sit, cannot lie down flat, speaks in phrases, mild retractions, audible wheezing, pulse 100-120.    -  SEVERE: Very SOB at rest, speaks in single words, struggling to breathe, sitting hunched forward, retractions, pulse > 120      moderate 5. RECURRENT SYMPTOM: "Have you had difficulty breathing before?" If Yes, ask: "When was the last time?" and "What happened that time?"     Yes but SOB more than usual - pt wants to check to see if need continuous 2 6. CARDIAC HISTORY: "Do you have any history of heart disease?" (e.g., heart attack, angina, bypass surgery, angioplasty)      no 7. LUNG HISTORY: "Do you have any history of lung disease?"  (e.g., pulmonary embolus, asthma, emphysema)     Asthma, COPD 8. CAUSE: "What do you think is causing the breathing problem?"      COPD flare up 9. OTHER SYMPTOMS: "Do you have any other symptoms? (e.g., dizziness, runny nose, cough, chest pain, fever)     Wheezing 10. O2 SATURATION MONITOR:  "Do you use an oxygen saturation monitor (pulse oximeter) at home?" If Yes, ask: "What is your reading (oxygen level) today?" "What is your usual oxygen saturation reading?" (e.g., 95%)       Unknown  11. PREGNANCY: "Is there any chance you are pregnant?" "When was your last menstrual period?"       N/a 12. TRAVEL: "Have you traveled out of the country in the last month?" (e.g., travel history, exposures)       N/a  Protocols used: Breathing Difficulty-A-AH

## 2024-02-22 ENCOUNTER — Telehealth: Payer: Medicare HMO | Admitting: Family Medicine

## 2024-02-22 ENCOUNTER — Other Ambulatory Visit: Payer: Self-pay

## 2024-02-22 ENCOUNTER — Other Ambulatory Visit (HOSPITAL_COMMUNITY): Payer: Self-pay

## 2024-02-22 DIAGNOSIS — M7918 Myalgia, other site: Secondary | ICD-10-CM | POA: Diagnosis not present

## 2024-02-22 DIAGNOSIS — M25552 Pain in left hip: Secondary | ICD-10-CM

## 2024-02-22 DIAGNOSIS — M25551 Pain in right hip: Secondary | ICD-10-CM | POA: Diagnosis not present

## 2024-02-22 DIAGNOSIS — J449 Chronic obstructive pulmonary disease, unspecified: Secondary | ICD-10-CM | POA: Diagnosis not present

## 2024-02-22 MED ORDER — ALBUTEROL SULFATE (2.5 MG/3ML) 0.083% IN NEBU
2.5000 mg | INHALATION_SOLUTION | RESPIRATORY_TRACT | 3 refills | Status: DC | PRN
  Filled 2024-02-22: qty 75, 5d supply, fill #0
  Filled 2024-02-28: qty 75, 5d supply, fill #1
  Filled 2024-04-17: qty 75, 5d supply, fill #2

## 2024-02-22 NOTE — Progress Notes (Unsigned)
 Virtual Visit via Video Note  I connected with [name] [name] on 02/22/24 at  1:20 PM EST by a video enabled telemedicine application and verified that I am speaking with the correct person using two identifiers.  Location: Patient: [place] - home Provider: [place] - office   I discussed the limitations of evaluation and management by telemedicine and the availability of in person appointments. The patient expressed understanding and agreed to proceed.  History of Present Illness: Patient reports increasing SOB. Worse in the morning. She smoked for 22 years but quit 29 years ago. Patient not using oxygen. Patient also reports persistent hip an buttock sx since fall   Observations/Objective:   Assessment and Plan: 1. Chronic obstructive pulmonary disease, unspecified COPD type (HCC) (Primary)  - albuterol (PROVENTIL) (2.5 MG/3ML) 0.083% nebulizer solution; Take 3 mLs (2.5 mg total) by nebulization every 4 (four) hours as needed for wheezing or shortness of breath.  Dispense: 75 mL; Refill: 3 - Ambulatory referral to Pulmonology  2. Bilateral hip pain Referral for PT - Ambulatory referral to Home Health  3. Right buttock pain As above   Follow Up Instructions:    I discussed the assessment and treatment plan with the patient. The patient was provided an opportunity to ask questions and all were answered. The patient agreed with the plan and demonstrated an understanding of the instructions.   The patient was advised to call back or seek an in-person evaluation if the symptoms worsen or if the condition fails to improve as anticipated.  I provided 20 minutes of non-face-to-face time during this encounter.   [name], MD

## 2024-02-24 ENCOUNTER — Telehealth: Payer: Self-pay

## 2024-02-24 ENCOUNTER — Encounter: Payer: Self-pay | Admitting: Family Medicine

## 2024-02-24 NOTE — Telephone Encounter (Signed)
 Referral received for home health PT. I called the patient to inquire if she has a preference for home health agencies and she did not. I explained to her that we work with about 10 home health agencies and I will check to see if they are able to accept the referral. However, there is no guarantee that any of them will accept it because they need to be in network with her insurance and have available staffing.  She said she understood and was very Adult nurse.   Messages sent to the following agencies for review:   [facility]- [facility]- [facility]- Audrea Muscat [facility]- Gwenlyn Fudge [facility]- Carma Lair

## 2024-02-25 DIAGNOSIS — E876 Hypokalemia: Secondary | ICD-10-CM | POA: Diagnosis not present

## 2024-02-25 DIAGNOSIS — E669 Obesity, unspecified: Secondary | ICD-10-CM | POA: Diagnosis not present

## 2024-02-25 DIAGNOSIS — Z6841 Body Mass Index (BMI) 40.0 and over, adult: Secondary | ICD-10-CM | POA: Diagnosis not present

## 2024-02-25 DIAGNOSIS — J449 Chronic obstructive pulmonary disease, unspecified: Secondary | ICD-10-CM | POA: Diagnosis not present

## 2024-02-25 DIAGNOSIS — M25552 Pain in left hip: Secondary | ICD-10-CM | POA: Diagnosis not present

## 2024-02-25 DIAGNOSIS — I959 Hypotension, unspecified: Secondary | ICD-10-CM | POA: Diagnosis not present

## 2024-02-25 DIAGNOSIS — J309 Allergic rhinitis, unspecified: Secondary | ICD-10-CM | POA: Diagnosis not present

## 2024-02-25 DIAGNOSIS — Z5982 Transportation insecurity: Secondary | ICD-10-CM | POA: Diagnosis not present

## 2024-02-25 DIAGNOSIS — M25551 Pain in right hip: Secondary | ICD-10-CM | POA: Diagnosis not present

## 2024-02-27 ENCOUNTER — Other Ambulatory Visit (HOSPITAL_COMMUNITY): Payer: Self-pay

## 2024-02-27 ENCOUNTER — Telehealth: Payer: Self-pay

## 2024-02-27 NOTE — Telephone Encounter (Signed)
 Call place to home health . I was unable to get through with the phone number that was left.

## 2024-02-27 NOTE — Telephone Encounter (Signed)
 Per [place],/ [facility], they accepted the referral and start of care was 02/25/2024.   I called the patient and she confirmed that the PT has already been out to see her.  [facility] was not able to accept the referral but contacted [facility].  Will need to contact [facility] and cancel.   I cancelled the referral with: [facility], [facility] and [facility].

## 2024-02-28 ENCOUNTER — Other Ambulatory Visit: Payer: Self-pay | Admitting: Family Medicine

## 2024-02-28 ENCOUNTER — Other Ambulatory Visit: Payer: Self-pay

## 2024-02-28 ENCOUNTER — Telehealth: Payer: Self-pay | Admitting: Family Medicine

## 2024-02-28 ENCOUNTER — Other Ambulatory Visit (HOSPITAL_COMMUNITY): Payer: Self-pay

## 2024-02-28 DIAGNOSIS — M25551 Pain in right hip: Secondary | ICD-10-CM | POA: Diagnosis not present

## 2024-02-28 DIAGNOSIS — J309 Allergic rhinitis, unspecified: Secondary | ICD-10-CM | POA: Diagnosis not present

## 2024-02-28 DIAGNOSIS — J449 Chronic obstructive pulmonary disease, unspecified: Secondary | ICD-10-CM | POA: Diagnosis not present

## 2024-02-28 DIAGNOSIS — M25552 Pain in left hip: Secondary | ICD-10-CM | POA: Diagnosis not present

## 2024-02-28 DIAGNOSIS — E669 Obesity, unspecified: Secondary | ICD-10-CM | POA: Diagnosis not present

## 2024-02-28 DIAGNOSIS — I959 Hypotension, unspecified: Secondary | ICD-10-CM | POA: Diagnosis not present

## 2024-02-28 DIAGNOSIS — E876 Hypokalemia: Secondary | ICD-10-CM | POA: Diagnosis not present

## 2024-02-28 DIAGNOSIS — Z5982 Transportation insecurity: Secondary | ICD-10-CM | POA: Diagnosis not present

## 2024-02-28 DIAGNOSIS — Z6841 Body Mass Index (BMI) 40.0 and over, adult: Secondary | ICD-10-CM | POA: Diagnosis not present

## 2024-02-28 MED ORDER — PANCRELIPASE (LIP-PROT-AMYL) 36000-114000 UNITS PO CPEP
36000.0000 [IU] | ORAL_CAPSULE | Freq: Four times a day (QID) | ORAL | 0 refills | Status: DC | PRN
  Filled 2024-02-28: qty 300, 75d supply, fill #0

## 2024-02-28 NOTE — Telephone Encounter (Signed)
 Copied from CRM [id number]. Topic: Clinical - Medication Question >> Feb 28, 2024 10:10 AM Tiffany B wrote: Reason for CRM: Patient had a telemedicine appointment on 02/22/2024 and patient states PCP was going to prescribe 2 medications. Caller does not know the name of the other medication and states she received the albuterol. Caller also would like to know how much each albuterol valve is because the pharmacy only gave her 1 box and chart reflects PCP sent in 3 refills.

## 2024-02-29 ENCOUNTER — Other Ambulatory Visit (HOSPITAL_COMMUNITY): Payer: Self-pay

## 2024-02-29 ENCOUNTER — Other Ambulatory Visit: Payer: Self-pay

## 2024-02-29 NOTE — Telephone Encounter (Signed)
I called and gave verbal orders for PT

## 2024-02-29 NOTE — Telephone Encounter (Signed)
 Cecelia from [facility] called to f/u on the PT order. Attempted to confirm the correct phone number and found out the number was incorrect. Please f/u with Cecelia at [phone]

## 2024-02-29 NOTE — Telephone Encounter (Signed)
 I spoke to [name]/ [name] and she confirmed that they cancelled the PT referral

## 2024-03-03 DIAGNOSIS — Z6841 Body Mass Index (BMI) 40.0 and over, adult: Secondary | ICD-10-CM | POA: Diagnosis not present

## 2024-03-03 DIAGNOSIS — E876 Hypokalemia: Secondary | ICD-10-CM | POA: Diagnosis not present

## 2024-03-03 DIAGNOSIS — I959 Hypotension, unspecified: Secondary | ICD-10-CM | POA: Diagnosis not present

## 2024-03-03 DIAGNOSIS — M25552 Pain in left hip: Secondary | ICD-10-CM | POA: Diagnosis not present

## 2024-03-03 DIAGNOSIS — M25551 Pain in right hip: Secondary | ICD-10-CM | POA: Diagnosis not present

## 2024-03-03 DIAGNOSIS — J309 Allergic rhinitis, unspecified: Secondary | ICD-10-CM | POA: Diagnosis not present

## 2024-03-03 DIAGNOSIS — E669 Obesity, unspecified: Secondary | ICD-10-CM | POA: Diagnosis not present

## 2024-03-03 DIAGNOSIS — Z5982 Transportation insecurity: Secondary | ICD-10-CM | POA: Diagnosis not present

## 2024-03-03 DIAGNOSIS — J449 Chronic obstructive pulmonary disease, unspecified: Secondary | ICD-10-CM | POA: Diagnosis not present

## 2024-03-06 ENCOUNTER — Telehealth: Payer: Self-pay | Admitting: Family Medicine

## 2024-03-06 DIAGNOSIS — M25551 Pain in right hip: Secondary | ICD-10-CM | POA: Diagnosis not present

## 2024-03-06 DIAGNOSIS — M25552 Pain in left hip: Secondary | ICD-10-CM | POA: Diagnosis not present

## 2024-03-06 DIAGNOSIS — Z5982 Transportation insecurity: Secondary | ICD-10-CM | POA: Diagnosis not present

## 2024-03-06 DIAGNOSIS — E876 Hypokalemia: Secondary | ICD-10-CM | POA: Diagnosis not present

## 2024-03-06 DIAGNOSIS — E669 Obesity, unspecified: Secondary | ICD-10-CM | POA: Diagnosis not present

## 2024-03-06 DIAGNOSIS — Z6841 Body Mass Index (BMI) 40.0 and over, adult: Secondary | ICD-10-CM | POA: Diagnosis not present

## 2024-03-06 DIAGNOSIS — J449 Chronic obstructive pulmonary disease, unspecified: Secondary | ICD-10-CM | POA: Diagnosis not present

## 2024-03-06 DIAGNOSIS — I959 Hypotension, unspecified: Secondary | ICD-10-CM | POA: Diagnosis not present

## 2024-03-06 DIAGNOSIS — J309 Allergic rhinitis, unspecified: Secondary | ICD-10-CM | POA: Diagnosis not present

## 2024-03-06 NOTE — Telephone Encounter (Signed)
 I called number back that was left and got a voicemail.  I left a message for them to return my call.

## 2024-03-06 NOTE — Telephone Encounter (Signed)
 Copied from CRM [id number]. Topic: Clinical - Home Health Verbal Orders >> Mar 05, 2024  4:50 PM DeAngela L wrote: Caller/Agency:Arielle  with [facility]  Callback Number: [phone] Service Requested: a verbal request for a Social worker Evaluation  Frequency:  Any new concerns about the patient? Yes The call dropped

## 2024-03-07 ENCOUNTER — Other Ambulatory Visit (HOSPITAL_COMMUNITY): Payer: Self-pay

## 2024-03-07 MED ORDER — DICLOFENAC SODIUM 75 MG PO TBEC
75.0000 mg | DELAYED_RELEASE_TABLET | Freq: Two times a day (BID) | ORAL | 0 refills | Status: DC
  Filled 2024-03-07: qty 60, 30d supply, fill #0

## 2024-03-07 NOTE — Telephone Encounter (Signed)
 Arielle called back from [facility]. Called [facility] per notes. Office Closed for lunch. Can reach back out to caller if needed. Thank You

## 2024-03-09 ENCOUNTER — Encounter: Payer: Self-pay | Admitting: Pulmonary Disease

## 2024-03-09 DIAGNOSIS — E669 Obesity, unspecified: Secondary | ICD-10-CM | POA: Diagnosis not present

## 2024-03-09 DIAGNOSIS — Z5982 Transportation insecurity: Secondary | ICD-10-CM | POA: Diagnosis not present

## 2024-03-09 DIAGNOSIS — J449 Chronic obstructive pulmonary disease, unspecified: Secondary | ICD-10-CM | POA: Diagnosis not present

## 2024-03-09 DIAGNOSIS — Z6841 Body Mass Index (BMI) 40.0 and over, adult: Secondary | ICD-10-CM | POA: Diagnosis not present

## 2024-03-09 DIAGNOSIS — M25551 Pain in right hip: Secondary | ICD-10-CM | POA: Diagnosis not present

## 2024-03-09 DIAGNOSIS — I959 Hypotension, unspecified: Secondary | ICD-10-CM | POA: Diagnosis not present

## 2024-03-09 DIAGNOSIS — E876 Hypokalemia: Secondary | ICD-10-CM | POA: Diagnosis not present

## 2024-03-09 DIAGNOSIS — M25552 Pain in left hip: Secondary | ICD-10-CM | POA: Diagnosis not present

## 2024-03-09 DIAGNOSIS — J309 Allergic rhinitis, unspecified: Secondary | ICD-10-CM | POA: Diagnosis not present

## 2024-03-13 DIAGNOSIS — M25551 Pain in right hip: Secondary | ICD-10-CM | POA: Diagnosis not present

## 2024-03-13 DIAGNOSIS — J309 Allergic rhinitis, unspecified: Secondary | ICD-10-CM | POA: Diagnosis not present

## 2024-03-13 DIAGNOSIS — M25552 Pain in left hip: Secondary | ICD-10-CM | POA: Diagnosis not present

## 2024-03-13 DIAGNOSIS — E669 Obesity, unspecified: Secondary | ICD-10-CM | POA: Diagnosis not present

## 2024-03-13 DIAGNOSIS — E876 Hypokalemia: Secondary | ICD-10-CM | POA: Diagnosis not present

## 2024-03-13 DIAGNOSIS — Z5982 Transportation insecurity: Secondary | ICD-10-CM | POA: Diagnosis not present

## 2024-03-13 DIAGNOSIS — J449 Chronic obstructive pulmonary disease, unspecified: Secondary | ICD-10-CM | POA: Diagnosis not present

## 2024-03-13 DIAGNOSIS — Z6841 Body Mass Index (BMI) 40.0 and over, adult: Secondary | ICD-10-CM | POA: Diagnosis not present

## 2024-03-13 DIAGNOSIS — I959 Hypotension, unspecified: Secondary | ICD-10-CM | POA: Diagnosis not present

## 2024-03-13 NOTE — Telephone Encounter (Signed)
 I called back and no one answered so I left an voice message to call me back.

## 2024-03-13 NOTE — Telephone Encounter (Signed)
 Arielle returning call. Tried to transfer to [facility] to speak with [place] but no one answered. I was unable to reach back out to Arielle. Kept getting a busy signal. Please call her back. Thanks

## 2024-03-14 NOTE — Telephone Encounter (Signed)
 Verbal order has been given to [employer] for social  worker

## 2024-03-14 NOTE — Telephone Encounter (Signed)
 I returned call no one answered so I left a voicemail to return my call.

## 2024-03-23 DIAGNOSIS — J309 Allergic rhinitis, unspecified: Secondary | ICD-10-CM | POA: Diagnosis not present

## 2024-03-23 DIAGNOSIS — I959 Hypotension, unspecified: Secondary | ICD-10-CM | POA: Diagnosis not present

## 2024-03-23 DIAGNOSIS — E669 Obesity, unspecified: Secondary | ICD-10-CM | POA: Diagnosis not present

## 2024-03-23 DIAGNOSIS — Z6841 Body Mass Index (BMI) 40.0 and over, adult: Secondary | ICD-10-CM | POA: Diagnosis not present

## 2024-03-23 DIAGNOSIS — M25551 Pain in right hip: Secondary | ICD-10-CM | POA: Diagnosis not present

## 2024-03-23 DIAGNOSIS — J449 Chronic obstructive pulmonary disease, unspecified: Secondary | ICD-10-CM | POA: Diagnosis not present

## 2024-03-23 DIAGNOSIS — E876 Hypokalemia: Secondary | ICD-10-CM | POA: Diagnosis not present

## 2024-03-23 DIAGNOSIS — Z5982 Transportation insecurity: Secondary | ICD-10-CM | POA: Diagnosis not present

## 2024-03-23 DIAGNOSIS — M25552 Pain in left hip: Secondary | ICD-10-CM | POA: Diagnosis not present

## 2024-03-26 ENCOUNTER — Other Ambulatory Visit: Payer: Self-pay

## 2024-03-26 ENCOUNTER — Ambulatory Visit: Payer: Self-pay | Admitting: Family Medicine

## 2024-03-26 ENCOUNTER — Other Ambulatory Visit (HOSPITAL_COMMUNITY): Payer: Self-pay

## 2024-03-26 NOTE — Telephone Encounter (Signed)
 Copied from CRM [id number]. Topic: Clinical - Red Word Triage >> Mar 26, 2024  3:05 PM [name] [name] wrote: Red Word that prompted transfer to Nurse Triage: Blood oxygen level is at 64/85. She needs oxygen. Head is throbbing, shob.   Chief Complaint: shortness of breath Symptoms: trouble breathing, headache Frequency: constant Pertinent Negatives: Patient denies chest pain Disposition: [x] ED /[] Urgent Care (no appt availability in office) / [] Appointment(In office/virtual)/ []  [place] [facility]/ [] Home Care/ [] Refused Recommended Disposition /[] [place] [facility]/ []  Follow-up with PCP Additional Notes: Patient states her pulse ox is 68%. States her oxygen level drops whenever she goes to sleep. This RN notes patient speaking in short phrases. RN advising EMS, pt refusing stating that she is tired of goling to the ED. Pt reports that she has an initial visit with pulmonology on 4/29, but says that she needs oxygen now. Call made to [facility], this RN was advised to send message to clinical pool [facility] so they can follow-up. RN advised patient of this process and also advised that ordering oxygen will take time and encouraged patient to go to ED again. Pt became tearful over the phone, repeating "I am tired of this happening. It cost to much to keep going to the hospital, I have to chose between paying bills and breathing?" Pt states that she will find transport to ED if her PCP can order home oxygen for her. Will route HP to clinics. Oxygen level 85% at end of call.  Reason for Disposition  SEVERE difficulty breathing (e.g., struggling for each breath, speaks in single words)  Answer Assessment - Initial Assessment Questions 1. RESPIRATORY STATUS: "Describe your breathing?" (e.g., wheezing, shortness of breath, unable to speak, severe coughing)      Short of breath, speaking in short sentences  2. ONSET: "When did this breathing problem begin?"      Ongoing for over a month  3. PATTERN "Does the  difficult breathing come and go, or has it been constant since it started?"      Comes and goes, worse when trying to sleep. Pulse ox drops to 66%  4. SEVERITY: "How bad is your breathing?" (e.g., mild, moderate, severe)    - MILD: No SOB at rest, mild SOB with walking, speaks normally in sentences, can lie down, no retractions, pulse < 100.    - MODERATE: SOB at rest, SOB with minimal exertion and prefers to sit, cannot lie down flat, speaks in phrases, mild retractions, audible wheezing, pulse 100-120.    - SEVERE: Very SOB at rest, speaks in single words, struggling to breathe, sitting hunched forward, retractions, pulse > 120      Modertae shortness of breath  5. RECURRENT SYMPTOM: "Have you had difficulty breathing before?" If Yes, ask: "When was the last time?" and "What happened that time?"      Yes, awaiting pulmonology appt and home oxygen  6. CARDIAC HISTORY: "Do you have any history of heart disease?" (e.g., heart attack, angina, bypass surgery, angioplasty)      No  7. LUNG HISTORY: "Do you have any history of lung disease?"  (e.g., pulmonary embolus, asthma, emphysema)    Has not been diagnosed, yet. Pulmonologist appt end of April.  8. CAUSE: "What do you think is causing the breathing problem?"      Unsure of cause  9. OTHER SYMPTOMS: "Do you have any other symptoms? (e.g., dizziness, runny nose, cough, chest pain, fever)     Headache  10. O2 SATURATION MONITOR:  "  Do you use an oxygen saturation monitor (pulse oximeter) at home?" If Yes, ask: "What is your reading (oxygen level) today?" "What is your usual oxygen saturation reading?" (e.g., 95%)       68%  11. PREGNANCY: "Is there any chance you are pregnant?" "When was your last menstrual period?"       No  12. TRAVEL: "Have you traveled out of the country in the last month?" (e.g., travel history, exposures)       No  Protocols used: Breathing Difficulty-A-AH

## 2024-03-29 ENCOUNTER — Other Ambulatory Visit: Payer: Self-pay

## 2024-04-03 DIAGNOSIS — Z6841 Body Mass Index (BMI) 40.0 and over, adult: Secondary | ICD-10-CM | POA: Diagnosis not present

## 2024-04-03 DIAGNOSIS — E876 Hypokalemia: Secondary | ICD-10-CM | POA: Diagnosis not present

## 2024-04-03 DIAGNOSIS — M25552 Pain in left hip: Secondary | ICD-10-CM | POA: Diagnosis not present

## 2024-04-03 DIAGNOSIS — J449 Chronic obstructive pulmonary disease, unspecified: Secondary | ICD-10-CM | POA: Diagnosis not present

## 2024-04-03 DIAGNOSIS — J309 Allergic rhinitis, unspecified: Secondary | ICD-10-CM | POA: Diagnosis not present

## 2024-04-03 DIAGNOSIS — I959 Hypotension, unspecified: Secondary | ICD-10-CM | POA: Diagnosis not present

## 2024-04-03 DIAGNOSIS — Z5982 Transportation insecurity: Secondary | ICD-10-CM | POA: Diagnosis not present

## 2024-04-03 DIAGNOSIS — E669 Obesity, unspecified: Secondary | ICD-10-CM | POA: Diagnosis not present

## 2024-04-03 DIAGNOSIS — M25551 Pain in right hip: Secondary | ICD-10-CM | POA: Diagnosis not present

## 2024-04-05 ENCOUNTER — Telehealth: Payer: Self-pay | Admitting: Family Medicine

## 2024-04-05 NOTE — Telephone Encounter (Signed)
Placed in provider's box for review.

## 2024-04-05 NOTE — Telephone Encounter (Signed)
 Copied from CRM [id number]. Topic: General - Other >> Apr 05, 2024 11:52 AM Quay Burow wrote: Reason for CRM: TRACEY THE NURSE MANAGER WITH [employer] CALLED IN REGARDS TO THE PATIENTS POWER SCOOTER NEEDING SERVICING, STATED THEY ARE NEEDING A PRESCRIPTION FOR THIS TO BE DONE AND STATED SHE NEEDS IT AS SOON AS POSSIBLE. TRACEY'S CONTACT NUMBER IS [phone] EXT [id number]. THANKS

## 2024-04-06 ENCOUNTER — Telehealth: Payer: Self-pay | Admitting: Family

## 2024-04-06 NOTE — Telephone Encounter (Signed)
Placed on providers desk

## 2024-04-06 NOTE — Telephone Encounter (Signed)
 Sent to provider

## 2024-04-06 NOTE — Telephone Encounter (Signed)
 Please provide patient request. Thank you.

## 2024-04-09 NOTE — Telephone Encounter (Signed)
 I sent a message to Amy since she is covering for Dr. Elvan Hamel today to ask if she would send a rx for PWC repair to [facility]. That's all they need is  a rx or a letter stating that the provider approved repairs for power wheel chair . Patient just needs new batteries .

## 2024-04-09 NOTE — Telephone Encounter (Signed)
 Thanks

## 2024-04-09 NOTE — Telephone Encounter (Signed)
 Hey [name] [name] I do apologize that the full message did not transfer over - this is Dr. Ruddy Corral patient but I just need a prescription to [facility] that stating "PWC repair needed for maintenance" and signed by a provider. Patient has been waiting for rx to sent back to the [facility] since 04/05/24 - I called [facility] Friday 04/06/24 - they stated that in order to provide batteries to her power wheel chair they need a prescription for repair.

## 2024-04-09 NOTE — Telephone Encounter (Signed)
 I am unfamiliar with prescribing the requested order. Dr. Elvan Hamel please advise on 04/10/2024. Thank you.

## 2024-04-10 ENCOUNTER — Telehealth: Payer: Self-pay

## 2024-04-10 DIAGNOSIS — I959 Hypotension, unspecified: Secondary | ICD-10-CM | POA: Diagnosis not present

## 2024-04-10 DIAGNOSIS — Z6841 Body Mass Index (BMI) 40.0 and over, adult: Secondary | ICD-10-CM | POA: Diagnosis not present

## 2024-04-10 DIAGNOSIS — J449 Chronic obstructive pulmonary disease, unspecified: Secondary | ICD-10-CM | POA: Diagnosis not present

## 2024-04-10 DIAGNOSIS — M25551 Pain in right hip: Secondary | ICD-10-CM | POA: Diagnosis not present

## 2024-04-10 DIAGNOSIS — Z5982 Transportation insecurity: Secondary | ICD-10-CM | POA: Diagnosis not present

## 2024-04-10 DIAGNOSIS — M25552 Pain in left hip: Secondary | ICD-10-CM | POA: Diagnosis not present

## 2024-04-10 DIAGNOSIS — J309 Allergic rhinitis, unspecified: Secondary | ICD-10-CM | POA: Diagnosis not present

## 2024-04-10 DIAGNOSIS — E669 Obesity, unspecified: Secondary | ICD-10-CM | POA: Diagnosis not present

## 2024-04-10 DIAGNOSIS — E876 Hypokalemia: Secondary | ICD-10-CM | POA: Diagnosis not present

## 2024-04-10 NOTE — Telephone Encounter (Signed)
 Copied from CRM [id number]. Topic: General - Other >> Apr 10, 2024 11:57 AM Hobson Luna F wrote: Reason for CRM: Patient is calling in because she is checking on the status of her power chair repair. Patient says if her chair goes out she will have to crawl around the house. Patient is requesting the prescription be sent in today. She says she really needs this repair.

## 2024-04-11 ENCOUNTER — Other Ambulatory Visit: Payer: Self-pay | Admitting: Family Medicine

## 2024-04-11 ENCOUNTER — Telehealth: Admitting: Physician Assistant

## 2024-04-11 DIAGNOSIS — L03119 Cellulitis of unspecified part of limb: Secondary | ICD-10-CM

## 2024-04-11 DIAGNOSIS — Z8739 Personal history of other diseases of the musculoskeletal system and connective tissue: Secondary | ICD-10-CM

## 2024-04-11 MED ORDER — CEPHALEXIN 500 MG PO CAPS: 500.0000 mg | ORAL_CAPSULE | Freq: Two times a day (BID) | ORAL | 0 refills | Status: AC

## 2024-04-11 NOTE — Telephone Encounter (Signed)
 Completed and faxed.

## 2024-04-11 NOTE — Progress Notes (Signed)
 Virtual Visit Consent   [name] [name], you are scheduled for a virtual visit with a [facility] provider today. Just as with appointments in the office, your consent must be obtained to participate. Your consent will be active for this visit and any virtual visit you may have with one of our providers in the next 365 days. If you have a MyChart account, a copy of this consent can be sent to you electronically.  As this is a virtual visit, video technology does not allow for your provider to perform a traditional examination. This may limit your provider's ability to fully assess your condition. If your provider identifies any concerns that need to be evaluated in person or the need to arrange testing (such as labs, EKG, etc.), we will make arrangements to do so. Although advances in technology are sophisticated, we cannot ensure that it will always work on either your end or our end. If the connection with a video visit is poor, the visit may have to be switched to a telephone visit. With either a video or telephone visit, we are not always able to ensure that we have a secure connection.  By engaging in this virtual visit, you consent to the provision of healthcare and authorize for your insurance to be billed (if applicable) for the services provided during this visit. Depending on your insurance coverage, you may receive a charge related to this service.  I need to obtain your verbal consent now. Are you willing to proceed with your visit today? [name] [name] has provided verbal consent on 04/11/2024 for a virtual visit (video or telephone). Char Common Ward, PA-C  Date: 04/11/2024 7:16 PM   Virtual Visit via Video Note   I, Char Common Ward, connected with  [name] [name]  ([id number], March 30, 1956) on 04/11/24 at  7:00 PM EDT by a video-enabled telemedicine application and verified that I am speaking with the correct person using two identifiers.  Location: Patient: Virtual Visit Location Patient:  Home Provider: Virtual Visit Location Provider: Home Office   I discussed the limitations of evaluation and management by telemedicine and the availability of in person appointments. The patient expressed understanding and agreed to proceed.    History of Present Illness: [name] [name] is a 68 y.o. who identifies as a female who was assigned female at birth, and is being seen today for redness and swelling to bilateral lower extremities, left greater than right.  Denies pain.  Pt has a nurse neighbor who is concerned she has cellulitis.  Pt is wheelchair bound.  She has trouble getting to doctors appointments due to transportation.  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   Hypokalemia 01/29/2013   Muscle spasm of back 01/27/2013   Abdominal pain 01/26/2013   Choledocholithiasis 01/17/2013   Acute respiratory failure: hypoxic and hypercarbic 01/08/2013   Atelectasis 01/08/2013   Pleural effusion 01/08/2013   Hypotension 01/08/2013   Leukocytosis 01/06/2013   Gallstone pancreatitis 01/04/2013   Common biliary duct obstruction 01/04/2013   OBESITY 11/27/2007   Allergic rhinitis 11/27/2007   DEGENERATIVE DISC DISEASE 11/27/2007   Headache  11/27/2007   DYSPNEA 11/27/2007   COUGH, CHRONIC 11/27/2007   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  Mushrooms   Tape Rash   Medications:  Current Outpatient Medications:    cephALEXin (KEFLEX) 500 MG capsule, Take 1 capsule (500 mg total) by mouth 2 (two) times daily for 7 days., Disp: 14 capsule, Rfl: 0   albuterol (PROVENTIL) (2.5 MG/3ML) 0.083% nebulizer solution, Take 3 mLs (2.5 mg total) by nebulization every 4 (four) hours as needed for wheezing or shortness of breath., Disp: 75 mL, Rfl: 3   Coenzyme Q10 (CO Q 10 PO), Take by mouth., Disp: , Rfl:    Cyanocobalamin (VITAMIN B-12) 5000 MCG TBDP, Take by mouth., Disp: , Rfl:    diclofenac (VOLTAREN) 75 MG EC tablet, Take 1 tablet (75 mg total) by mouth 2 (two) times daily., Disp: 60 tablet, Rfl: 0   hydrochlorothiazide (HYDRODIURIL) 50 MG tablet, Take 1 tablet (50 mg total) by mouth every 24 hours as needed for fluid.., Disp: 90 tablet, Rfl: 1   HYDROcodone-acetaminophen (NORCO/VICODIN) 5-325 MG tablet, Take 1 tablet by mouth every 6 (six) hours as needed., Disp: , Rfl:    lipase/protease/amylase (CREON) 36000 UNITS CPEP capsule, Take 1 capsule (36,000 Units total) by mouth 3 (three) to 4 (four) times daily as needed., Disp: 360 capsule, Rfl: 0   lisinopril (ZESTRIL) 20 MG tablet, Take 1 tablet (20 mg total) by mouth daily for hypertension, Disp: 100 tablet, Rfl: 0   Magnesium 300 MG CAPS, Take by mouth., Disp: , Rfl:    metoprolol succinate (TOPROL XL) 25 MG 24 hr tablet, Take 1 tablet (25 mg total) by mouth daily., Disp: 30 tablet, Rfl: 3   mirabegron ER (MYRBETRIQ) 50 MG TB24 tablet, Take 1 tablet (50 mg total) by mouth daily.., Disp: 100 tablet, Rfl: 1   Multiple Vitamin (MULTIVITAMIN WITH MINERALS) TABS, Take 1 tablet by mouth daily., Disp: , Rfl:    naproxen (NAPROSYN) 500 MG tablet, Take 500 mg by mouth 2 (two) times daily., Disp: , Rfl:    polyethylene glycol powder (GLYCOLAX/MIRALAX) 17 GM/SCOOP powder, Take by mouth., Disp: , Rfl:    Probiotic Product (PROBIOTIC BLEND PO), Take by mouth., Disp: , Rfl:    Observations/Objective: Patient is well-developed, well-nourished in no acute distress.  Resting comfortably at home.  Head is normocephalic, atraumatic.  No labored breathing.  Speech is clear and coherent with logical content.  Patient is alert and oriented at baseline.    Assessment and Plan: 1. Cellulitis of lower extremity, unspecified laterality (Primary)  Possible venous statis, but given limited physical exam via telehealth and patients inability to be seen in clinic will start antibiotic for possible cellulitis. Not concerned for DVT, redness and swelling is anterior, no calf tenderness, minimal pain.   Follow Up Instructions: I discussed the assessment and treatment plan with the patient. The patient was provided an opportunity to ask questions and all were answered. The patient agreed with the plan and demonstrated an understanding of the instructions.  A copy of instructions were sent to the patient via MyChart unless otherwise noted below.     The patient was advised to call back or seek an in-person evaluation if the symptoms  worsen or if the condition fails to improve as anticipated.    Char Common Ward, PA-C

## 2024-04-11 NOTE — Patient Instructions (Signed)
 Ulyses Gandy, thank you for joining Char Common Ward, PA-C for today's virtual visit.  While this provider is not your primary care provider (PCP), if your PCP is located in our provider database this encounter information will be shared with them immediately following your visit.   A [facility] MyChart account gives you access to today's visit and all your visits, tests, and labs performed at [facility] " click here if you don't have a [facility] MyChart account or go to [email or web address]  Consent: (Patient) [name] [name] provided verbal consent for this virtual visit at the beginning of the encounter.  Current Medications:  Current Outpatient Medications:    cephALEXin (KEFLEX) 500 MG capsule, Take 1 capsule (500 mg total) by mouth 2 (two) times daily for 7 days., Disp: 14 capsule, Rfl: 0   albuterol (PROVENTIL) (2.5 MG/3ML) 0.083% nebulizer solution, Take 3 mLs (2.5 mg total) by nebulization every 4 (four) hours as needed for wheezing or shortness of breath., Disp: 75 mL, Rfl: 3   Coenzyme Q10 (CO Q 10 PO), Take by mouth., Disp: , Rfl:    Cyanocobalamin (VITAMIN B-12) 5000 MCG TBDP, Take by mouth., Disp: , Rfl:    diclofenac (VOLTAREN) 75 MG EC tablet, Take 1 tablet (75 mg total) by mouth 2 (two) times daily., Disp: 60 tablet, Rfl: 0   hydrochlorothiazide (HYDRODIURIL) 50 MG tablet, Take 1 tablet (50 mg total) by mouth every 24 hours as needed for fluid.., Disp: 90 tablet, Rfl: 1   HYDROcodone-acetaminophen (NORCO/VICODIN) 5-325 MG tablet, Take 1 tablet by mouth every 6 (six) hours as needed., Disp: , Rfl:    lipase/protease/amylase (CREON) 36000 UNITS CPEP capsule, Take 1 capsule (36,000 Units total) by mouth 3 (three) to 4 (four) times daily as needed., Disp: 360 capsule, Rfl: 0   lisinopril (ZESTRIL) 20 MG tablet, Take 1 tablet (20 mg total) by mouth daily for hypertension, Disp: 100 tablet, Rfl: 0   Magnesium 300 MG CAPS, Take by mouth., Disp: , Rfl:    metoprolol  succinate (TOPROL XL) 25 MG 24 hr tablet, Take 1 tablet (25 mg total) by mouth daily., Disp: 30 tablet, Rfl: 3   mirabegron ER (MYRBETRIQ) 50 MG TB24 tablet, Take 1 tablet (50 mg total) by mouth daily.., Disp: 100 tablet, Rfl: 1   Multiple Vitamin (MULTIVITAMIN WITH MINERALS) TABS, Take 1 tablet by mouth daily., Disp: , Rfl:    naproxen (NAPROSYN) 500 MG tablet, Take 500 mg by mouth 2 (two) times daily., Disp: , Rfl:    polyethylene glycol powder (GLYCOLAX/MIRALAX) 17 GM/SCOOP powder, Take by mouth., Disp: , Rfl:    Probiotic Product (PROBIOTIC BLEND PO), Take by mouth., Disp: , Rfl:    Medications ordered in this encounter:  Meds ordered this encounter  Medications   cephALEXin (KEFLEX) 500 MG capsule    Sig: Take 1 capsule (500 mg total) by mouth 2 (two) times daily for 7 days.    Dispense:  14 capsule    Refill:  0    Supervising Provider:   HAGLER, BRIAN [[id number]]     *If you need refills on other medications prior to your next appointment, please contact your pharmacy*  Follow-Up: Call back or seek an in-person evaluation if the symptoms worsen or if the condition fails to improve as anticipated.  [facility] [phone]  Other Instructions Take antibiotic as prescribed.  Recommend in person visit with your Primary Care Physician.    If you have been instructed to  have an in-person evaluation today at a local Urgent Care facility, please use the link below. It will take you to a list of all of our available [facility], including address, phone number and hours of operation. Please do not delay care.  [facility]  If you or a family member do not have a primary care provider, use the link below to schedule a visit and establish care. When you choose a [facility] primary care physician or advanced practice provider, you gain a long-term partner in health. Find a Primary Care Provider  Learn more about [facility]'s in-office and virtual  care options: [facility] - Get Care Now

## 2024-04-12 ENCOUNTER — Telehealth: Payer: Self-pay | Admitting: Family Medicine

## 2024-04-12 NOTE — Telephone Encounter (Signed)
        Spoke with patient she stated she wanted to know if paperwork was sent in to [employer]. I let patient know paperwork was sent in and we have confirmation that it was sent over, I assured patient I would refax paperwork and also email paperwork to email provided on the [employer] paperwork that was sent over to us . I also noticed Office administrator about phone call from Ms.Lampley.

## 2024-04-17 ENCOUNTER — Other Ambulatory Visit (HOSPITAL_COMMUNITY): Payer: Self-pay

## 2024-04-17 MED ORDER — MIRABEGRON ER 50 MG PO TB24
50.0000 mg | ORAL_TABLET | Freq: Every day | ORAL | 0 refills | Status: DC
  Filled 2024-04-17: qty 90, 90d supply, fill #0

## 2024-04-20 DIAGNOSIS — Z6841 Body Mass Index (BMI) 40.0 and over, adult: Secondary | ICD-10-CM | POA: Diagnosis not present

## 2024-04-20 DIAGNOSIS — I959 Hypotension, unspecified: Secondary | ICD-10-CM | POA: Diagnosis not present

## 2024-04-20 DIAGNOSIS — J449 Chronic obstructive pulmonary disease, unspecified: Secondary | ICD-10-CM | POA: Diagnosis not present

## 2024-04-20 DIAGNOSIS — M25551 Pain in right hip: Secondary | ICD-10-CM | POA: Diagnosis not present

## 2024-04-20 DIAGNOSIS — E876 Hypokalemia: Secondary | ICD-10-CM | POA: Diagnosis not present

## 2024-04-20 DIAGNOSIS — J309 Allergic rhinitis, unspecified: Secondary | ICD-10-CM | POA: Diagnosis not present

## 2024-04-20 DIAGNOSIS — E669 Obesity, unspecified: Secondary | ICD-10-CM | POA: Diagnosis not present

## 2024-04-20 DIAGNOSIS — Z5982 Transportation insecurity: Secondary | ICD-10-CM | POA: Diagnosis not present

## 2024-04-20 DIAGNOSIS — M25552 Pain in left hip: Secondary | ICD-10-CM | POA: Diagnosis not present

## 2024-04-23 DIAGNOSIS — M47819 Spondylosis without myelopathy or radiculopathy, site unspecified: Secondary | ICD-10-CM | POA: Diagnosis not present

## 2024-04-23 DIAGNOSIS — M4800 Spinal stenosis, site unspecified: Secondary | ICD-10-CM | POA: Diagnosis not present

## 2024-04-24 ENCOUNTER — Other Ambulatory Visit: Payer: Self-pay

## 2024-04-24 ENCOUNTER — Encounter: Payer: Self-pay | Admitting: Pulmonary Disease

## 2024-04-24 ENCOUNTER — Ambulatory Visit: Payer: Medicare HMO | Admitting: Pulmonary Disease

## 2024-04-24 VITALS — BP 132/68 | HR 58 | Ht 62.0 in | Wt 390.0 lb

## 2024-04-24 DIAGNOSIS — Z87891 Personal history of nicotine dependence: Secondary | ICD-10-CM | POA: Diagnosis not present

## 2024-04-24 DIAGNOSIS — Z6841 Body Mass Index (BMI) 40.0 and over, adult: Secondary | ICD-10-CM

## 2024-04-24 DIAGNOSIS — J9611 Chronic respiratory failure with hypoxia: Secondary | ICD-10-CM | POA: Diagnosis not present

## 2024-04-24 DIAGNOSIS — J4530 Mild persistent asthma, uncomplicated: Secondary | ICD-10-CM | POA: Diagnosis not present

## 2024-04-24 DIAGNOSIS — R0683 Snoring: Secondary | ICD-10-CM

## 2024-04-24 LAB — CBC WITH DIFFERENTIAL/PLATELET
Basophils Absolute: 0.1 10*3/uL (ref 0.0–0.1)
Basophils Relative: 0.8 % (ref 0.0–3.0)
Eosinophils Absolute: 0.4 10*3/uL (ref 0.0–0.7)
Eosinophils Relative: 3.6 % (ref 0.0–5.0)
HCT: 48.6 % — ABNORMAL HIGH (ref 36.0–46.0)
Hemoglobin: 15.7 g/dL — ABNORMAL HIGH (ref 12.0–15.0)
Lymphocytes Relative: 20.7 % (ref 12.0–46.0)
Lymphs Abs: 2.1 10*3/uL (ref 0.7–4.0)
MCHC: 32.2 g/dL (ref 30.0–36.0)
MCV: 91.6 fl (ref 78.0–100.0)
Monocytes Absolute: 0.8 10*3/uL (ref 0.1–1.0)
Monocytes Relative: 7.8 % (ref 3.0–12.0)
Neutro Abs: 6.9 10*3/uL (ref 1.4–7.7)
Neutrophils Relative %: 67.1 % (ref 43.0–77.0)
Platelets: 210 10*3/uL (ref 150.0–400.0)
RBC: 5.31 Mil/uL — ABNORMAL HIGH (ref 3.87–5.11)
RDW: 15.1 % (ref 11.5–15.5)
WBC: 10.2 10*3/uL (ref 4.0–10.5)

## 2024-04-24 LAB — COMPREHENSIVE METABOLIC PANEL WITH GFR
ALT: 28 U/L (ref 0–35)
AST: 23 U/L (ref 0–37)
Albumin: 3.9 g/dL (ref 3.5–5.2)
Alkaline Phosphatase: 58 U/L (ref 39–117)
BUN: 18 mg/dL (ref 6–23)
CO2: 34 meq/L — ABNORMAL HIGH (ref 19–32)
Calcium: 10 mg/dL (ref 8.4–10.5)
Chloride: 96 meq/L (ref 96–112)
Creatinine, Ser: 0.72 mg/dL (ref 0.40–1.20)
GFR: 87.61 mL/min (ref >60.00)
Glucose, Bld: 89 mg/dL (ref 70–99)
Potassium: 4.6 meq/L (ref 3.5–5.1)
Sodium: 138 meq/L (ref 135–145)
Total Bilirubin: 0.3 mg/dL (ref 0.2–1.2)
Total Protein: 7.2 g/dL (ref 6.0–8.3)

## 2024-04-24 MED ORDER — BUDESONIDE 0.25 MG/2ML IN SUSP
0.2500 mg | Freq: Two times a day (BID) | RESPIRATORY_TRACT | 11 refills | Status: AC
  Filled 2024-04-24: qty 120, 30d supply, fill #0
  Filled 2024-05-03 – 2024-05-22 (×2): qty 120, 30d supply, fill #1
  Filled 2024-07-11: qty 120, 30d supply, fill #2
  Filled 2024-10-23: qty 120, 30d supply, fill #3
  Filled 2024-12-06: qty 120, 30d supply, fill #4
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
## 2024-04-24 NOTE — Patient Instructions (Addendum)
 We will schedule you for a split night sleep study to determine if you have sleep apnea  Start budesonide nebulizer treatments twice daily  Continue albuterol  nebulizer treatments as needed  We will check labs today and schedule you for pulmonary function tests and ABG at the hospital  We will check a sit/stand test today to see if you need supplemental oxygen  Follow up in 3 months

## 2024-04-25 ENCOUNTER — Telehealth: Payer: Self-pay | Admitting: Family Medicine

## 2024-04-25 ENCOUNTER — Encounter: Payer: Self-pay | Admitting: Pulmonary Disease

## 2024-04-25 NOTE — Telephone Encounter (Signed)
 [name] [name] given  verbal orders.  Patient called regarding her SOB status. Patient said that she was seen at her pulmonologist office on  04/24/2024. Pt was given meds, O2 for home,and other test order. Patient will seek help if need to. Patient request wheelchair legs for her wheelchair. Order placed to [name]  Provider has papers at her desk. Awaiting signature

## 2024-04-25 NOTE — Telephone Encounter (Signed)
Placed in provider's box for review.

## 2024-04-26 ENCOUNTER — Ambulatory Visit: Payer: Self-pay

## 2024-04-26 ENCOUNTER — Telehealth: Payer: Self-pay | Admitting: Pulmonary Disease

## 2024-04-26 DIAGNOSIS — Z5982 Transportation insecurity: Secondary | ICD-10-CM | POA: Diagnosis not present

## 2024-04-26 DIAGNOSIS — J449 Chronic obstructive pulmonary disease, unspecified: Secondary | ICD-10-CM | POA: Diagnosis not present

## 2024-04-26 DIAGNOSIS — J309 Allergic rhinitis, unspecified: Secondary | ICD-10-CM | POA: Diagnosis not present

## 2024-04-26 DIAGNOSIS — E669 Obesity, unspecified: Secondary | ICD-10-CM | POA: Diagnosis not present

## 2024-04-26 DIAGNOSIS — Z87891 Personal history of nicotine dependence: Secondary | ICD-10-CM | POA: Diagnosis not present

## 2024-04-26 DIAGNOSIS — Z6841 Body Mass Index (BMI) 40.0 and over, adult: Secondary | ICD-10-CM | POA: Diagnosis not present

## 2024-04-26 DIAGNOSIS — Z993 Dependence on wheelchair: Secondary | ICD-10-CM | POA: Diagnosis not present

## 2024-04-26 NOTE — Telephone Encounter (Signed)
 Noted.

## 2024-04-26 NOTE — Telephone Encounter (Signed)
 E2C2 Pulmonary Triage - Initial Assessment Questions "Chief Complaint (e.g., cough, sob, wheezing, fever, chills, sweat or additional symptoms) *Go to specific symptom protocol after initial questions. SOB  "How long have symptoms been present?" Ongoing x 3 months  Unable to complete triage as pt referred to ED, refused 911, reports she will get a ride. Pt notes her SpO2 was 78% this AM when HH was at her home, reports she was advised she should be seen but declined as she felt it would improve on it's own. Pt labored, current O2 sat 86%, advised ED. Pt agreeable. This RN educated pt on home care, new-worsening symptoms, when to call back/seek emergent care. Pt verbalized understanding and agrees to plan.     Copied from CRM [id number]. Topic: Clinical - Red Word Triage >> Apr 26, 2024  4:36 PM Tyronne Galloway wrote: Red Word that prompted transfer to Nurse Triage: Pt's oxygen levels have been dropping consistently all day to low 80s. Patient called twice to confirm oxygen order has been placed, however the clinic has not returned her call. Reason for Disposition  [1] MODERATE difficulty breathing (e.g., speaks in phrases, SOB even at rest, pulse 100-120) AND [2] NEW-onset or WORSE than normal  Answer Assessment - Initial Assessment Questions 1. RESPIRATORY STATUS: "Describe your breathing?" (e.g., wheezing, shortness of breath, unable to speak, severe coughing)      SOB 2. ONSET: "When did this breathing problem begin?"      X 3 months 3. PATTERN "Does the difficult breathing come and go, or has it been constant since it started?"      Constant 4. SEVERITY: "How bad is your breathing?" (e.g., mild, moderate, severe)    - MILD: No SOB at rest, mild SOB with walking, speaks normally in sentences, can lie down, no retractions, pulse < 100.    - MODERATE: SOB at rest, SOB with minimal exertion and prefers to sit, cannot lie down flat, speaks in phrases, mild retractions, audible wheezing, pulse 100-120.     - SEVERE: Very SOB at rest, speaks in single words, struggling to breathe, sitting hunched forward, retractions, pulse > 120      Moderate-Severe 5. RECURRENT SYMPTOM: "Have you had difficulty breathing before?" If Yes, ask: "When was the last time?" and "What happened that time?"      Yes  10. O2 SATURATION MONITOR:  "Do you use an oxygen saturation monitor (pulse oximeter) at home?" If Yes, ask: "What is your reading (oxygen level) today?" "What is your usual oxygen saturation reading?" (e.g., 95%)       86%  Protocols used: Breathing Difficulty-A-AH

## 2024-04-26 NOTE — Telephone Encounter (Signed)
 [name] [name]; Cut Off, [name]; [name], [name]; Cain, [name]; 1 other Please have provider copy and paste the nurses sats in their progress note OR to co-sign the nurses sats.  Thank you

## 2024-04-27 ENCOUNTER — Telehealth: Payer: Self-pay

## 2024-04-27 ENCOUNTER — Ambulatory Visit: Payer: Self-pay | Admitting: Pulmonary Disease

## 2024-04-27 NOTE — Telephone Encounter (Signed)
Dr. Francine Graven, please advise. Thanks

## 2024-04-27 NOTE — Telephone Encounter (Signed)
 Please refer to 04/27/2024 phone note.

## 2024-04-27 NOTE — Telephone Encounter (Signed)
 Called patient as she asked earlier to check in and see if she had heard anything about Oxygen being delivered.  No answer--Left a voicemail for her to call back.

## 2024-04-27 NOTE — Telephone Encounter (Signed)
 Spoke w/ Mitch of [employer] about O2 He states Everything looks good and they're going to get it to her immediately.

## 2024-04-27 NOTE — Telephone Encounter (Signed)
 Copied from CRM [id number]. Topic: Clinical - Red Word Triage >> Apr 27, 2024  9:44 AM Juliana Ocean wrote: Red Word that prompted transfer to Nurse Triage: O2 in 80's this am.  She said dr ordered O2 Tues.    Patient called and is very frustrated with the Office. Patient states she was told Tuesday by Dr Diania Fortes Oxygen was ordered and is supposed to be sent to her house.  87% on room air today.  Patient did not want to answer anymore questions at this time and is tired of going to the hospital or refusing to go to the hospital and not getting anywhere with getting Oxygen sent to her home. This RN advised the patient that if her Oxygen levels are low and she is having difficulty breathing and nothing has changed since she was triaged yesterday this RN still recommends the same thing---which is for her to go to the Emergency Room for further evaluation. This RN called the [facility] to advise them that them that the patient is refusing to go to the Emergency Room at this time. Patient states that her grandbaby is being dedicated to the [employer] and she does not want to miss this by going to the hospital at this time. This RN advised her to take care of herself as well and if she gets worse to please call 911 or go to the hospital. This RN called the [facility] to let them know patient refusing ED at this time and to check on the status of the Oxygen being delivered.  Left a message with [facility] staff and advised to send a High Priority message.    Reason for Disposition . [1] MODERATE difficulty breathing (e.g., speaks in phrases, SOB even at rest, pulse 100-120) AND [2] NEW-onset or WORSE than normal    Oxygen levels are in the 80s and patient states she is supposed to have Oxygen delivered and hasn't heard anything about it yet  Answer Assessment - Initial Assessment Questions 1. RESPIRATORY STATUS: "Describe your breathing?" (e.g., wheezing, shortness of breath, unable to speak, severe coughing)      Difficulty  breathing and low Oxygen Sats 2. ONSET: "When did this breathing problem begin?"      3 months ago 3. PATTERN "Does the difficult breathing come and go, or has it been constant since it started?"      ---- 4. SEVERITY: "How bad is your breathing?" (e.g., mild, moderate, severe)    - MILD: No SOB at rest, mild SOB with walking, speaks normally in sentences, can lie down, no retractions, pulse < 100.    - MODERATE: SOB at rest, SOB with minimal exertion and prefers to sit, cannot lie down flat, speaks in phrases, mild retractions, audible wheezing, pulse 100-120.    - SEVERE: Very SOB at rest, speaks in single words, struggling to breathe, sitting hunched forward, retractions, pulse > 120      ------ 5. RECURRENT SYMPTOM: "Have you had difficulty breathing before?" If Yes, ask: "When was the last time?" and "What happened that time?"      ----- 6. CARDIAC HISTORY: "Do you have any history of heart disease?" (e.g., heart attack, angina, bypass surgery, angioplasty)      ---- 7. LUNG HISTORY: "Do you have any history of lung disease?"  (e.g., pulmonary embolus, asthma, emphysema)     ---- 8. CAUSE: "What do you think is causing the breathing problem?"      ---- 9. OTHER SYMPTOMS: "Do you have  any other symptoms? (e.g., dizziness, runny nose, cough, chest pain, fever)     ---- 10. O2 SATURATION MONITOR:  "Do you use an oxygen saturation monitor (pulse oximeter) at home?" If Yes, ask: "What is your reading (oxygen level) today?" "What is your usual oxygen saturation reading?" (e.g., 95%)       87% right now on room air 11. PREGNANCY: "Is there any chance you are pregnant?" "When was your last menstrual period?"       ----- 12. TRAVEL: "Have you traveled out of the country in the last month?" (e.g., travel history, exposures)       -----  Protocols used: Breathing Difficulty-A-AH

## 2024-04-27 NOTE — Telephone Encounter (Signed)
 Copied from CRM #[id number]. Topic: Clinical - Prescription Issue >> Apr 26, 2024  4:32 PM Tyronne Galloway wrote: Reason for CRM: Patient stated she wanted confirmation regarding if the order for her oxygen had been placed, and if so, where the order was sent to. I attempted to view encounters, however I did not see information regarding o2. Please call the patient back at [phone].

## 2024-04-29 ENCOUNTER — Telehealth: Admitting: Physician Assistant

## 2024-04-29 DIAGNOSIS — R109 Unspecified abdominal pain: Secondary | ICD-10-CM

## 2024-04-29 DIAGNOSIS — M545 Low back pain, unspecified: Secondary | ICD-10-CM

## 2024-04-29 NOTE — Patient Instructions (Signed)
 [name] [name], thank you for joining Marciana Settle, PA-C for today's virtual visit.  While this provider is not your primary care provider (PCP), if your PCP is located in our provider database this encounter information will be shared with them immediately following your visit.   A [facility] MyChart account gives you access to today's visit and all your visits, tests, and labs performed at [facility] " click here if you don't have a [facility] MyChart account or go to [email or web address]  Consent: (Patient) PRERNA MCCREARY provided verbal consent for this virtual visit at the beginning of the encounter.  Current Medications:  Current Outpatient Medications:    albuterol  (PROVENTIL ) (2.5 MG/3ML) 0.083% nebulizer solution, Take 3 mLs (2.5 mg total) by nebulization every 4 (four) hours as needed for wheezing or shortness of breath., Disp: 75 mL, Rfl: 3   budesonide  (PULMICORT ) 0.25 MG/2ML nebulizer solution, Take 2 mLs (0.25 mg total) by nebulization 2 (two) times daily., Disp: 120 mL, Rfl: 11   Coenzyme Q10 (CO Q 10 PO), Take by mouth., Disp: , Rfl:    Cyanocobalamin (VITAMIN B-12) 5000 MCG TBDP, Take by mouth., Disp: , Rfl:    diclofenac  (VOLTAREN ) 75 MG EC tablet, Take 1 tablet (75 mg total) by mouth 2 (two) times daily., Disp: 60 tablet, Rfl: 0   hydrochlorothiazide  (HYDRODIURIL ) 50 MG tablet, Take 1 tablet (50 mg total) by mouth every 24 hours as needed for fluid.., Disp: 90 tablet, Rfl: 1   lipase/protease/amylase (CREON ) 36000 UNITS CPEP capsule, Take 1 capsule (36,000 Units total) by mouth 3 (three) to 4 (four) times daily as needed., Disp: 360 capsule, Rfl: 0   lisinopril  (ZESTRIL ) 20 MG tablet, Take 1 tablet (20 mg total) by mouth daily for hypertension, Disp: 100 tablet, Rfl: 0   Magnesium 300 MG CAPS, Take by mouth., Disp: , Rfl:    metoprolol  succinate (TOPROL  XL) 25 MG 24 hr tablet, Take 1 tablet (25 mg total) by mouth daily., Disp: 30 tablet, Rfl: 3    mirabegron  ER (MYRBETRIQ ) 50 MG TB24 tablet, Take 1 tablet (50 mg total) by mouth daily.., Disp: 100 tablet, Rfl: 0   Multiple Vitamin (MULTIVITAMIN WITH MINERALS) TABS, Take 1 tablet by mouth daily., Disp: , Rfl:    polyethylene glycol powder (GLYCOLAX /MIRALAX ) 17 GM/SCOOP powder, Take by mouth., Disp: , Rfl:    Probiotic Product (PROBIOTIC BLEND PO), Take by mouth., Disp: , Rfl:    Medications ordered in this encounter:  No orders of the defined types were placed in this encounter.    *If you need refills on other medications prior to your next appointment, please contact your pharmacy*  Follow-Up: Call back or seek an in-person evaluation if the symptoms worsen or if the condition fails to improve as anticipated.  [facility] [phone]  Other Instructions Report to nearest ER or UC for evaluation.    If you have been instructed to have an in-person evaluation today at a local Urgent Care facility, please use the link below. It will take you to a list of all of our available [facility], including address, phone number and hours of operation. Please do not delay care.  [facility]  If you or a family member do not have a primary care provider, use the link below to schedule a visit and establish care. When you choose a [facility] primary care physician or advanced practice provider, you gain a long-term partner in health. Find  a Primary Care Provider  Learn more about [facility]'s in-office and virtual care options: [facility] Get Care Now

## 2024-04-29 NOTE — Progress Notes (Signed)
 Virtual Visit Consent   [name] [name], you are scheduled for a virtual visit with a [facility] provider today. Just as with appointments in the office, your consent must be obtained to participate. Your consent will be active for this visit and any virtual visit you may have with one of our providers in the next 365 days. If you have a MyChart account, a copy of this consent can be sent to you electronically.  As this is a virtual visit, video technology does not allow for your provider to perform a traditional examination. This may limit your provider's ability to fully assess your condition. If your provider identifies any concerns that need to be evaluated in person or the need to arrange testing (such as labs, EKG, etc.), we will make arrangements to do so. Although advances in technology are sophisticated, we cannot ensure that it will always work on either your end or our end. If the connection with a video visit is poor, the visit may have to be switched to a telephone visit. With either a video or telephone visit, we are not always able to ensure that we have a secure connection.  By engaging in this virtual visit, you consent to the provision of healthcare and authorize for your insurance to be billed (if applicable) for the services provided during this visit. Depending on your insurance coverage, you may receive a charge related to this service.  I need to obtain your verbal consent now. Are you willing to proceed with your visit today? [name] [name] has provided verbal consent on 04/29/2024 for a virtual visit (video or telephone). Marciana Settle, [place]  Date: 04/29/2024 3:44 PM   Virtual Visit via Video Note   I, Marciana Settle, connected with  [name] [name]  ([id number], 1956-07-04) on 04/29/24 at  3:45 PM EDT by a video-enabled telemedicine application and verified that I am speaking with the correct person using two identifiers.  Location: Patient: Virtual Visit Location Patient:  Home Provider: Virtual Visit Location Provider: Home Office   I discussed the limitations of evaluation and management by telemedicine and the availability of in person appointments. The patient expressed understanding and agreed to proceed.    History of Present Illness: [name] [name] is a 68 y.o. who identifies as a female who was assigned female at birth, and is being seen today for kidney pain. Aaron Aas  HPI: Flank Pain This is a new problem. The current episode started today. The pain is present in the lumbar spine. The quality of the pain is described as aching. The pain is mild. The pain is The same all the time. Pertinent negatives include no abdominal pain, bladder incontinence, bowel incontinence, fever, numbness, paresis, paresthesias or weakness. She has tried nothing for the symptoms.    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   Anterior chest wall pain 02/03/2013   Uncontrolled pain 01/31/2013   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   Hypokalemia 01/29/2013   Muscle spasm of back 01/27/2013   Abdominal pain 01/26/2013   Choledocholithiasis 01/17/2013   Acute respiratory failure: hypoxic and hypercarbic 01/08/2013   Atelectasis 01/08/2013   Pleural effusion 01/08/2013   Hypotension 01/08/2013   Leukocytosis 01/06/2013   Gallstone pancreatitis 01/04/2013  Common biliary duct obstruction 01/04/2013   OBESITY 11/27/2007   Allergic rhinitis 11/27/2007   DEGENERATIVE DISC DISEASE 11/27/2007   Headache 11/27/2007   DYSPNEA 11/27/2007   COUGH, CHRONIC 11/27/2007   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  Mushrooms   Tape Rash   Medications:  Current Outpatient Medications:    albuterol  (PROVENTIL ) (2.5 MG/3ML) 0.083% nebulizer solution, Take 3 mLs (2.5 mg total) by nebulization every 4 (four) hours as needed for wheezing or shortness of breath., Disp: 75 mL, Rfl: 3   budesonide  (PULMICORT ) 0.25 MG/2ML nebulizer solution, Take 2 mLs (0.25 mg total) by nebulization 2 (two) times daily., Disp: 120 mL, Rfl: 11   Coenzyme Q10 (CO Q 10 PO), Take by mouth., Disp: , Rfl:    Cyanocobalamin (VITAMIN B-12) 5000 MCG TBDP, Take by mouth., Disp: , Rfl:    diclofenac  (VOLTAREN ) 75 MG EC tablet, Take 1 tablet (75 mg total) by mouth 2 (two) times daily., Disp: 60 tablet, Rfl: 0   hydrochlorothiazide  (HYDRODIURIL ) 50 MG tablet, Take 1 tablet (50 mg total) by mouth every 24 hours as needed for fluid.., Disp: 90 tablet, Rfl: 1   lipase/protease/amylase (CREON ) 36000 UNITS CPEP capsule, Take 1 capsule (36,000 Units total) by mouth 3 (three) to 4 (four) times daily as needed., Disp: 360 capsule, Rfl: 0   lisinopril  (ZESTRIL ) 20 MG tablet, Take 1 tablet (20 mg total) by mouth daily for hypertension, Disp: 100 tablet, Rfl: 0   Magnesium 300 MG CAPS, Take by mouth., Disp: , Rfl:    metoprolol  succinate (TOPROL  XL) 25 MG 24 hr tablet, Take 1 tablet (25 mg total) by mouth daily., Disp: 30 tablet, Rfl: 3   mirabegron  ER (MYRBETRIQ ) 50 MG TB24 tablet, Take 1 tablet (50 mg total) by mouth daily.., Disp: 100 tablet, Rfl: 0   Multiple Vitamin (MULTIVITAMIN WITH MINERALS) TABS, Take 1 tablet by mouth daily., Disp: , Rfl:    polyethylene glycol powder (GLYCOLAX /MIRALAX ) 17 GM/SCOOP powder, Take by mouth., Disp: , Rfl:    Probiotic Product (PROBIOTIC BLEND PO), Take by mouth., Disp: , Rfl:   Observations/Objective: Patient is well-developed, well-nourished in no acute  distress.  Resting comfortably  at home.  Head is normocephalic, atraumatic.  No labored breathing.  Speech is clear and coherent with logical content.  Patient is alert and oriented at baseline.    Assessment and Plan: 1. Low back pain, unspecified back pain laterality, unspecified chronicity, unspecified whether sciatica present (Primary)  2. Flank pain  Patient reporting new low back pain and flank pain. She states she is concerned for a kidney infection. Never had pain like this prior to. Recently finished a course of abx due to cellulitis. Given concern and wide differential I recommended an in person evaluation. She agreed to this plan and is to report for an in person evaluation.   Follow Up Instructions: I discussed the assessment and treatment plan with the patient. The patient was provided an opportunity to ask questions and all were answered. The patient agreed with the plan and demonstrated an understanding of the instructions.  A copy of instructions were sent to the patient via MyChart unless otherwise noted below.    The patient was advised  to call back or seek an in-person evaluation if the symptoms worsen or if the condition fails to improve as anticipated.    Marciana Settle, PA-C

## 2024-04-30 ENCOUNTER — Telehealth: Payer: Self-pay | Admitting: Family Medicine

## 2024-04-30 NOTE — Telephone Encounter (Signed)
 A document form from [facility] has been faxed:  physician order request , to be filled out by provider. Send document back via Fax within 7-days. Document is located in providers tray at front office.           Fax number:  [phone]

## 2024-05-01 ENCOUNTER — Telehealth: Payer: Self-pay | Admitting: Family Medicine

## 2024-05-01 NOTE — Telephone Encounter (Signed)
 A document form from [facility] has been faxed: Home Health Certificate (Order ID [id number]), to be filled out by provider. Send document back via Fax within 7-days. Document is located in providers tray at front office.          Fax number:  [phone]

## 2024-05-02 NOTE — Telephone Encounter (Signed)
 Noted.

## 2024-05-03 ENCOUNTER — Other Ambulatory Visit (HOSPITAL_COMMUNITY): Payer: Self-pay

## 2024-05-03 ENCOUNTER — Other Ambulatory Visit: Payer: Self-pay

## 2024-05-04 DIAGNOSIS — Z993 Dependence on wheelchair: Secondary | ICD-10-CM | POA: Diagnosis not present

## 2024-05-04 DIAGNOSIS — J449 Chronic obstructive pulmonary disease, unspecified: Secondary | ICD-10-CM | POA: Diagnosis not present

## 2024-05-04 DIAGNOSIS — E669 Obesity, unspecified: Secondary | ICD-10-CM | POA: Diagnosis not present

## 2024-05-04 DIAGNOSIS — J309 Allergic rhinitis, unspecified: Secondary | ICD-10-CM | POA: Diagnosis not present

## 2024-05-04 DIAGNOSIS — Z87891 Personal history of nicotine dependence: Secondary | ICD-10-CM | POA: Diagnosis not present

## 2024-05-04 DIAGNOSIS — Z5982 Transportation insecurity: Secondary | ICD-10-CM | POA: Diagnosis not present

## 2024-05-04 DIAGNOSIS — Z6841 Body Mass Index (BMI) 40.0 and over, adult: Secondary | ICD-10-CM | POA: Diagnosis not present

## 2024-05-10 ENCOUNTER — Telehealth: Payer: Self-pay

## 2024-05-10 DIAGNOSIS — J9611 Chronic respiratory failure with hypoxia: Secondary | ICD-10-CM

## 2024-05-10 NOTE — Telephone Encounter (Signed)
 Copied from CRM [id number]. Topic: Clinical - Request for Lab/Test Order >> May 10, 2024  1:05 PM Whitney O wrote: Reason for CRM: patient is calling cause they call from [place] for the breathing test . Wanting to see will doctor order a chest x ray while she is there feel like someone is stabbing me in the right lung . It happens a lot . And just curious am I having some issue causing all this trouble . Hoping they can do this all this at one time . Patient doesn't have transportation and she is in a wheelchair   It will just be easier if they can do this all at once  The breathing test is may 20th at 1:45 at [facility]  [phone] patient call back number   Please advise

## 2024-05-13 ENCOUNTER — Encounter: Payer: Self-pay | Admitting: Family Medicine

## 2024-05-14 ENCOUNTER — Telehealth: Admitting: Physician Assistant

## 2024-05-14 ENCOUNTER — Other Ambulatory Visit (HOSPITAL_COMMUNITY): Payer: Self-pay

## 2024-05-14 ENCOUNTER — Ambulatory Visit: Payer: Self-pay

## 2024-05-14 ENCOUNTER — Other Ambulatory Visit: Payer: Self-pay

## 2024-05-14 DIAGNOSIS — M545 Low back pain, unspecified: Secondary | ICD-10-CM | POA: Diagnosis not present

## 2024-05-14 MED ORDER — CYCLOBENZAPRINE HCL 10 MG PO TABS
10.0000 mg | ORAL_TABLET | Freq: Three times a day (TID) | ORAL | 0 refills | Status: DC | PRN
  Filled 2024-05-14: qty 30, 10d supply, fill #0

## 2024-05-14 NOTE — Telephone Encounter (Signed)
 Chest x-ray order placed to be done at [facility]

## 2024-05-14 NOTE — Patient Instructions (Signed)
 Ulyses Gandy, thank you for joining Hyla Maillard, PA-C for today's virtual visit.  While this provider is not your primary care provider (PCP), if your PCP is located in our provider database this encounter information will be shared with them immediately following your visit.   A [facility] MyChart account gives you access to today's visit and all your visits, tests, and labs performed at [facility] " click here if you don't have a [facility] MyChart account or go to [email or web address]  Consent: (Patient) [name] [name] provided verbal consent for this virtual visit at the beginning of the encounter.  Current Medications:  Current Outpatient Medications:    cyclobenzaprine  (FLEXERIL ) 10 MG tablet, Take 1 tablet (10 mg total) by mouth 3 (three) times daily as needed for muscle spasms., Disp: 30 tablet, Rfl: 0   albuterol  (PROVENTIL ) (2.5 MG/3ML) 0.083% nebulizer solution, Take 3 mLs (2.5 mg total) by nebulization every 4 (four) hours as needed for wheezing or shortness of breath., Disp: 75 mL, Rfl: 3   budesonide  (PULMICORT ) 0.25 MG/2ML nebulizer solution, Take 2 mLs (0.25 mg total) by nebulization 2 (two) times daily., Disp: 120 mL, Rfl: 11   Coenzyme Q10 (CO Q 10 PO), Take by mouth., Disp: , Rfl:    Cyanocobalamin (VITAMIN B-12) 5000 MCG TBDP, Take by mouth., Disp: , Rfl:    diclofenac  (VOLTAREN ) 75 MG EC tablet, Take 1 tablet (75 mg total) by mouth 2 (two) times daily., Disp: 60 tablet, Rfl: 0   hydrochlorothiazide  (HYDRODIURIL ) 50 MG tablet, Take 1 tablet (50 mg total) by mouth every 24 hours as needed for fluid.., Disp: 90 tablet, Rfl: 1   lipase/protease/amylase (CREON ) 36000 UNITS CPEP capsule, Take 1 capsule (36,000 Units total) by mouth 3 (three) to 4 (four) times daily as needed., Disp: 360 capsule, Rfl: 0   lisinopril  (ZESTRIL ) 20 MG tablet, Take 1 tablet (20 mg total) by mouth daily for hypertension, Disp: 100 tablet, Rfl: 0   Magnesium 300 MG CAPS,  Take by mouth., Disp: , Rfl:    metoprolol  succinate (TOPROL  XL) 25 MG 24 hr tablet, Take 1 tablet (25 mg total) by mouth daily., Disp: 30 tablet, Rfl: 3   mirabegron  ER (MYRBETRIQ ) 50 MG TB24 tablet, Take 1 tablet (50 mg total) by mouth daily.., Disp: 100 tablet, Rfl: 0   Multiple Vitamin (MULTIVITAMIN WITH MINERALS) TABS, Take 1 tablet by mouth daily., Disp: , Rfl:    polyethylene glycol powder (GLYCOLAX /MIRALAX ) 17 GM/SCOOP powder, Take by mouth., Disp: , Rfl:    Probiotic Product (PROBIOTIC BLEND PO), Take by mouth., Disp: , Rfl:    Medications ordered in this encounter:  Meds ordered this encounter  Medications   cyclobenzaprine  (FLEXERIL ) 10 MG tablet    Sig: Take 1 tablet (10 mg total) by mouth 3 (three) times daily as needed for muscle spasms.    Dispense:  30 tablet    Refill:  0    Supervising Provider:   LAMPTEY, PHILIP O [[id number]]     *If you need refills on other medications prior to your next appointment, please contact your pharmacy*  Follow-Up: Call back or seek an in-person evaluation if the symptoms worsen or if the condition fails to improve as anticipated.  [facility] [phone]  Other Instructions Please use heating pad as discussed. Ok to continue OTC Tylenol . Take the Flexeril  no more than as directed -- remember it can make you sleepy.  If symptoms are not quickly calming down or  you note any worsening symptoms, please seek in-person evaluation ASAP.    If you have been instructed to have an in-person evaluation today at a local Urgent Care facility, please use the link below. It will take you to a list of all of our available [facility], including address, phone number and hours of operation. Please do not delay care.  [facility]  If you or a family member do not have a primary care provider, use the link below to schedule a visit and establish care. When you choose a [facility] primary care physician or  advanced practice provider, you gain a long-term partner in health. Find a Primary Care Provider  Learn more about [facility]'s in-office and virtual care options: [facility] - Get Care Now

## 2024-05-14 NOTE — Telephone Encounter (Signed)
 Patient had VV at [facility]

## 2024-05-14 NOTE — Progress Notes (Signed)
 Virtual Visit Consent   [name] [name], you are scheduled for a virtual visit with a [facility] provider today. Just as with appointments in the office, your consent must be obtained to participate. Your consent will be active for this visit and any virtual visit you may have with one of our providers in the next 365 days. If you have a MyChart account, a copy of this consent can be sent to you electronically.  As this is a virtual visit, video technology does not allow for your provider to perform a traditional examination. This may limit your provider's ability to fully assess your condition. If your provider identifies any concerns that need to be evaluated in person or the need to arrange testing (such as labs, EKG, etc.), we will make arrangements to do so. Although advances in technology are sophisticated, we cannot ensure that it will always work on either your end or our end. If the connection with a video visit is poor, the visit may have to be switched to a telephone visit. With either a video or telephone visit, we are not always able to ensure that we have a secure connection.  By engaging in this virtual visit, you consent to the provision of healthcare and authorize for your insurance to be billed (if applicable) for the services provided during this visit. Depending on your insurance coverage, you may receive a charge related to this service.  I need to obtain your verbal consent now. Are you willing to proceed with your visit today? [name] [name] has provided verbal consent on 05/14/2024 for a virtual visit (video or telephone). Hyla Maillard, [place]  Date: 05/14/2024 11:37 AM   Virtual Visit via Video Note   I, Hyla Maillard, connected with  [name] [name]  ([id number], 1956/10/14) on 05/14/24 at 11:30 AM EDT by a video-enabled telemedicine application and verified that I am speaking with the correct person using two identifiers.  Location: Patient: Virtual Visit Location  Patient: Home Provider: Virtual Visit Location Provider: Home Office   I discussed the limitations of evaluation and management by telemedicine and the availability of in person appointments. The patient expressed understanding and agreed to proceed.    History of Present Illness: [name] [name] is a 68 y.o. who identifies as a female who was assigned female at birth, and is being seen today for 4 days of pain in R side of back -- thoracic and lower. Notes affected with position change and sometimes deep breaths. Denies SOB or URI symptoms. Notes pain is about 7/10. Non-radiating. She has longstanding history of back issues and is wheelchair bound so notes this is common for her. Reached out to PCP office about muscle relaxant refill but has not heard back. Called office directly and was sent to triage. No PCP appointments available until June so was scheduled without virtual urgent care team.   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   Hypokalemia 01/29/2013   Muscle spasm of back 01/27/2013   Abdominal pain 01/26/2013   Choledocholithiasis 01/17/2013   Acute respiratory failure: hypoxic and hypercarbic 01/08/2013  Atelectasis 01/08/2013   Pleural effusion 01/08/2013   Hypotension 01/08/2013   Leukocytosis 01/06/2013   Gallstone pancreatitis 01/04/2013   Common biliary duct obstruction 01/04/2013   OBESITY 11/27/2007   Allergic rhinitis 11/27/2007   DEGENERATIVE DISC DISEASE 11/27/2007   Headache 11/27/2007   DYSPNEA 11/27/2007   COUGH, CHRONIC 11/27/2007   Acquired absence of genital  organ 11/27/2007    Allergies:  Allergies  Allergen Reactions   Ibuprofen Itching, Nausea And Vomiting, Swelling and Other (See Comments)    GI upset   Denture Adhesive Rash   Methocarbamol  Other (See Comments) and Rash   Other Hives and Rash    Mushrooms.  Mushrooms   Tape Rash   Medications:  Current Outpatient Medications:    cyclobenzaprine  (FLEXERIL ) 10 MG tablet, Take 1 tablet (10 mg total) by mouth 3 (three) times daily as needed for muscle spasms., Disp: 30 tablet, Rfl: 0   albuterol  (PROVENTIL ) (2.5 MG/3ML) 0.083% nebulizer solution, Take 3 mLs (2.5 mg total) by nebulization every 4 (four) hours as needed for wheezing or shortness of breath., Disp: 75 mL, Rfl: 3   budesonide  (PULMICORT ) 0.25 MG/2ML nebulizer solution, Take 2 mLs (0.25 mg total) by nebulization 2 (two) times daily., Disp: 120 mL, Rfl: 11   Coenzyme Q10 (CO Q 10 PO), Take by mouth., Disp: , Rfl:    Cyanocobalamin (VITAMIN B-12) 5000 MCG TBDP, Take by mouth., Disp: , Rfl:    diclofenac  (VOLTAREN ) 75 MG EC tablet, Take 1 tablet (75 mg total) by mouth 2 (two) times daily., Disp: 60 tablet, Rfl: 0   hydrochlorothiazide  (HYDRODIURIL ) 50 MG tablet, Take 1 tablet (50 mg total) by mouth every 24 hours as needed for fluid.., Disp: 90 tablet, Rfl: 1   lipase/protease/amylase (CREON ) 36000 UNITS CPEP capsule, Take 1 capsule (36,000 Units total) by mouth 3 (three) to 4 (four) times daily as needed., Disp: 360 capsule, Rfl: 0   lisinopril  (ZESTRIL ) 20 MG tablet, Take 1 tablet (20 mg total) by mouth daily for hypertension, Disp: 100 tablet, Rfl: 0   Magnesium 300 MG CAPS, Take by mouth., Disp: , Rfl:    metoprolol  succinate (TOPROL  XL) 25 MG 24 hr tablet, Take 1 tablet (25 mg total) by mouth daily., Disp: 30 tablet, Rfl: 3   mirabegron  ER (MYRBETRIQ ) 50 MG TB24 tablet, Take 1 tablet (50 mg total) by mouth daily.., Disp: 100 tablet, Rfl: 0   Multiple Vitamin (MULTIVITAMIN WITH MINERALS) TABS, Take 1 tablet by mouth daily., Disp:  , Rfl:    polyethylene glycol powder (GLYCOLAX /MIRALAX ) 17 GM/SCOOP powder, Take by mouth., Disp: , Rfl:    Probiotic Product (PROBIOTIC BLEND PO), Take by mouth., Disp: , Rfl:   Observations/Objective: Patient is well-developed, well-nourished in no acute distress.  Resting comfortably at home.  Head is normocephalic, atraumatic.  No labored breathing.  Speech is clear and coherent with logical content.  Patient is alert and oriented at baseline.   Assessment and Plan: 1. Acute right-sided low back pain without sciatica (Primary) - cyclobenzaprine  (FLEXERIL ) 10 MG tablet; Take 1 tablet (10 mg total) by mouth 3 (three) times daily as needed for muscle spasms.  Dispense: 30 tablet; Refill: 0  Supportive measures and OTC medications reviewed -- can continue to utilize Tylenol  as she is intolerant to Ibuprofen and similar. Gentle stretching and heating pad discussed. Flexeril  per orders.  Strict in-person follow-up precautions reviewed with patient.    Follow Up Instructions: I discussed the assessment and treatment plan with the patient. The patient  was provided an opportunity to ask questions and all were answered. The patient agreed with the plan and demonstrated an understanding of the instructions.  A copy of instructions were sent to the patient via MyChart unless otherwise noted below.   The patient was advised to call back or seek an in-person evaluation if the symptoms worsen or if the condition fails to improve as anticipated.    Hyla Maillard, PA-C

## 2024-05-14 NOTE — Telephone Encounter (Addendum)
 Copied from CRM [id number]. Topic: Clinical - Red Word Triage >> May 14, 2024 11:00 AM Juluis Ok wrote: [facility] that prompted transfer to Nurse Triage: muscle spasms, back pain  Chief Complaint: back pain Symptoms: 7/10 middle right back pain with muscle spasms Frequency: x 4 to 5 days Pertinent Negatives: Patient denies injury Disposition: [] ED /[] Urgent Care (no appt availability in office) / [] Appointment(In office/virtual)/ [x]  [place] [facility]/ [] Home Care/ [] Refused Recommended Disposition /[]  Mobile Bus/ []  Follow-up with PCP Additional Notes: pt not able to sleep last night due to pain and muscle spasms.  Pt stated has history of back pain and spasms and has taking pain medication and muscle relaxer's in the past that have helped.  No appts available in PCP office until June: pt wheelchair bound therefore unable to go to urgent care: appointment set for virtual urgent care visit.  Reason for Disposition  [1] SEVERE back pain (e.g., excruciating, unable to do any normal activities) AND [2] not improved 2 hours after pain medicine  Answer Assessment - Initial Assessment Questions 1. ONSET: "When did the pain begin?"      4 to 5 days 2. LOCATION: "Where does it hurt?" (upper, mid or lower back)     Middle right back area 3. SEVERITY: "How bad is the pain?"  (e.g., Scale 1-10; mild, moderate, or severe)   - MILD (1-3): Doesn't interfere with normal activities.    - MODERATE (4-7): Interferes with normal activities or awakens from sleep.    - SEVERE (8-10): Excruciating pain, unable to do any normal activities.      7/10 with muscle spasms 4. PATTERN: "Is the pain constant?" (e.g., yes, no; constant, intermittent)      constant 5. RADIATION: "Does the pain shoot into your legs or somewhere else?"     no 6. CAUSE:  "What do you think is causing the back pain?"      Has this for years 7. BACK OVERUSE:  "Any recent lifting of heavy objects, strenuous work or  exercise?"     no 8. MEDICINES: "What have you taken so far for the pain?" (e.g., nothing, acetaminophen , NSAIDS)     Took Tylenol  migraine without relief 9. NEUROLOGIC SYMPTOMS: "Do you have any weakness, numbness, or problems with bowel/bladder control?"     Numbness, weakness due to needing surgery in upper neck 10. OTHER SYMPTOMS: "Do you have any other symptoms?" (e.g., fever, abdomen pain, burning with urination, blood in urine)       no 11. PREGNANCY: "Is there any chance you are pregnant?" "When was your last menstrual period?"       N/a  Protocols used: Back Pain-A-AH

## 2024-05-14 NOTE — Telephone Encounter (Signed)
Pt is aware. Nothing further needed 

## 2024-05-15 ENCOUNTER — Ambulatory Visit (HOSPITAL_COMMUNITY)
Admission: RE | Admit: 2024-05-15 | Discharge: 2024-05-15 | Disposition: A | Discharge status: Home / Self Care | Source: Ambulatory Visit | Attending: Pulmonary Disease | Admitting: Pulmonary Disease

## 2024-05-15 ENCOUNTER — Ambulatory Visit (HOSPITAL_COMMUNITY)
Admission: RE | Admit: 2024-05-15 | Discharge: 2024-05-15 | Disposition: A | Discharge status: Home / Self Care | Source: Ambulatory Visit | Attending: Acute Care | Admitting: Acute Care

## 2024-05-15 DIAGNOSIS — Z6841 Body Mass Index (BMI) 40.0 and over, adult: Secondary | ICD-10-CM | POA: Diagnosis not present

## 2024-05-15 DIAGNOSIS — J9611 Chronic respiratory failure with hypoxia: Secondary | ICD-10-CM | POA: Diagnosis not present

## 2024-05-15 DIAGNOSIS — J4530 Mild persistent asthma, uncomplicated: Secondary | ICD-10-CM | POA: Insufficient documentation

## 2024-05-15 DIAGNOSIS — E669 Obesity, unspecified: Secondary | ICD-10-CM | POA: Diagnosis not present

## 2024-05-15 DIAGNOSIS — R0602 Shortness of breath: Secondary | ICD-10-CM | POA: Diagnosis not present

## 2024-05-15 LAB — PULMONARY FUNCTION TEST
DL/VA % pred: 142 %
DL/VA: 6.03 ml/min/mmHg/L
DLCO unc % pred: 97 %
DLCO unc: 18.05 ml/min/mmHg
FEF 25-75 Post: 0.97 L/s
FEF 25-75 Pre: 1.52 L/s
FEF2575-%Change-Post: -36 %
FEF2575-%Pred-Post: 48 %
FEF2575-%Pred-Pre: 75 %
FEV1-%Change-Post: -8 %
FEV1-%Pred-Post: 55 %
FEV1-%Pred-Pre: 60 %
FEV1-Post: 1.23 L
FEV1-Pre: 1.35 L
FEV1FVC-%Change-Post: -1 %
FEV1FVC-%Pred-Pre: 108 %
FEV6-%Change-Post: -7 %
FEV6-%Pred-Post: 53 %
FEV6-%Pred-Pre: 57 %
FEV6-Post: 1.49 L
FEV6-Pre: 1.61 L
FEV6FVC-%Pred-Post: 104 %
FEV6FVC-%Pred-Pre: 104 %
FVC-%Change-Post: -7 %
FVC-%Pred-Post: 51 %
FVC-%Pred-Pre: 55 %
FVC-Post: 1.49 L
FVC-Pre: 1.61 L
Post FEV1/FVC ratio: 83 %
Post FEV6/FVC ratio: 100 %
Pre FEV1/FVC ratio: 84 %
Pre FEV6/FVC Ratio: 100 %

## 2024-05-15 LAB — BLOOD GAS, ARTERIAL
Acid-Base Excess: 13.3 mmol/L — ABNORMAL HIGH (ref 0.0–2.0)
Bicarbonate: 39.3 mmol/L — ABNORMAL HIGH (ref 20.0–28.0)
Drawn by: 21179
O2 Saturation: 97.8 %
Patient temperature: 37
pCO2 arterial: 54 mmHg — ABNORMAL HIGH (ref 32–48)
pH, Arterial: 7.47 — ABNORMAL HIGH (ref 7.35–7.45)
pO2, Arterial: 83 mmHg (ref 83–108)

## 2024-05-15 MED ORDER — ALBUTEROL SULFATE (2.5 MG/3ML) 0.083% IN NEBU
2.5000 mg | INHALATION_SOLUTION | Freq: Once | RESPIRATORY_TRACT | Status: AC
  Administered 2024-05-15: 2.5 mg via RESPIRATORY_TRACT

## 2024-05-16 ENCOUNTER — Other Ambulatory Visit: Payer: Self-pay | Admitting: Family Medicine

## 2024-05-16 ENCOUNTER — Other Ambulatory Visit (HOSPITAL_COMMUNITY): Payer: Self-pay

## 2024-05-16 DIAGNOSIS — M545 Low back pain, unspecified: Secondary | ICD-10-CM

## 2024-05-16 MED ORDER — CYCLOBENZAPRINE HCL 10 MG PO TABS
10.0000 mg | ORAL_TABLET | Freq: Three times a day (TID) | ORAL | 0 refills | Status: DC | PRN
  Filled 2024-05-16 – 2024-06-04 (×3): qty 60, 20d supply, fill #0

## 2024-05-18 ENCOUNTER — Other Ambulatory Visit (HOSPITAL_COMMUNITY): Payer: Self-pay

## 2024-05-18 ENCOUNTER — Encounter (HOSPITAL_COMMUNITY): Payer: Self-pay

## 2024-05-18 ENCOUNTER — Other Ambulatory Visit: Payer: Self-pay | Admitting: Family Medicine

## 2024-05-22 ENCOUNTER — Ambulatory Visit: Payer: Self-pay

## 2024-05-22 ENCOUNTER — Other Ambulatory Visit: Payer: Self-pay

## 2024-05-22 ENCOUNTER — Encounter: Admitting: Family

## 2024-05-22 ENCOUNTER — Telehealth: Admitting: Physician Assistant

## 2024-05-22 ENCOUNTER — Other Ambulatory Visit (HOSPITAL_COMMUNITY): Payer: Self-pay

## 2024-05-22 ENCOUNTER — Telehealth: Payer: Self-pay | Admitting: Family

## 2024-05-22 DIAGNOSIS — M549 Dorsalgia, unspecified: Secondary | ICD-10-CM

## 2024-05-22 NOTE — Progress Notes (Signed)
  Because of severity of pain 9/10, I feel your condition warrants further evaluation and I recommend that you be seen in a face-to-face visit.   NOTE: There will be NO CHARGE for this E-Visit   If you are having a true medical emergency, please call 911.     For an urgent face to face visit, [place] has multiple urgent care centers for your convenience.  Click the link below for the full list of locations and hours, walk-in wait times, appointment scheduling options and driving directions:  Urgent Care - [place], [place], [place], [place], [place], [place]  [place]     Your MyChart E-visit questionnaire answers were reviewed by a board certified advanced clinical practitioner to complete your personal care plan based on your specific symptoms.    Thank you for using e-Visits.

## 2024-05-22 NOTE — Telephone Encounter (Signed)
 Copied from CRM [id number]. Topic: Clinical - Red Word Triage >> May 22, 2024 10:36 AM Lizabeth Riggs wrote: Red Word that prompted transfer to Nurse Triage:  She is having pain in her lower back and tail bone.  She is at a level 9 out 10.  Chief Complaint: low back pain 9/10 Symptoms: pain in low back and tailbone Frequency: constant Pertinent Negatives: Patient denies fever, numbness Disposition: [] ED /[] Urgent Care (no appt availability in office) / [x] Appointment(In office/virtual)/ []  [facility]/ [] Home Care/ [] Refused Recommended Disposition /[] [place] [facility]/ []  Follow-up with PCP Additional Notes: apt made for today; care advice given, denies questions; instructed to go to ER if becomes worse.   Reason for Disposition  [1] SEVERE back pain (e.g., excruciating, unable to do any normal activities) AND [2] not improved 2 hours after pain medicine  Answer Assessment - Initial Assessment Questions 1. ONSET: "When did the pain begin?"      Chronic; states 8 back surgeries 2. LOCATION: "Where does it hurt?" (upper, mid or lower back)     Lower back and tailbone 3. SEVERITY: "How bad is the pain?"  (e.g., Scale 1-10; mild, moderate, or severe)   - MILD (1-3): Doesn't interfere with normal activities.    - MODERATE (4-7): Interferes with normal activities or awakens from sleep.    - SEVERE (8-10): Excruciating pain, unable to do any normal activities.      9/10 4. PATTERN: "Is the pain constant?" (e.g., yes, no; constant, intermittent)      Constant but varies in intensity 5. RADIATION: "Does the pain shoot into your legs or somewhere else?"     Yes to legs 6. CAUSE:  "What do you think is causing the back pain?"      Chronic pain 7. BACK OVERUSE:  "Any recent lifting of heavy objects, strenuous work or exercise?"     denies 8. MEDICINES: "What have you taken so far for the pain?" (e.g., nothing, acetaminophen , NSAIDS)     Muscle relaxer 9. NEUROLOGIC SYMPTOMS: "Do  you have any weakness, numbness, or problems with bowel/bladder control?"     bladder 10. OTHER SYMPTOMS: "Do you have any other symptoms?" (e.g., fever, abdomen pain, burning with urination, blood in urine)       States keeps dropping things.  11. PREGNANCY: "Is there any chance you are pregnant?" "When was your last menstrual period?"       na  Protocols used: Back Pain-A-AH

## 2024-05-22 NOTE — Telephone Encounter (Signed)
 Patient has an appt today.

## 2024-05-22 NOTE — Telephone Encounter (Signed)
 Called pt back; pt stated she will do a VV so the provider can send in pain management referral. Changed appt to VV

## 2024-05-22 NOTE — Progress Notes (Signed)
 Erroneous encounter-disregard

## 2024-05-22 NOTE — Telephone Encounter (Signed)
 Reason for CRM: Pt called has acute appt scheduled for this afternoon for pain. Pt states she can't move in her wheel chair when its raining therefore needing to cancel appt for today. Pt would like a call back at [phone].

## 2024-05-23 MED ORDER — DICLOFENAC SODIUM 75 MG PO TBEC
75.0000 mg | DELAYED_RELEASE_TABLET | Freq: Two times a day (BID) | ORAL | 0 refills | Status: DC
  Filled 2024-05-23: qty 60, 30d supply, fill #0

## 2024-05-24 ENCOUNTER — Other Ambulatory Visit (HOSPITAL_COMMUNITY): Payer: Self-pay

## 2024-05-24 ENCOUNTER — Other Ambulatory Visit: Payer: Self-pay

## 2024-05-24 NOTE — Telephone Encounter (Signed)
 Noted

## 2024-05-25 ENCOUNTER — Ambulatory Visit: Payer: Self-pay

## 2024-05-25 NOTE — Telephone Encounter (Signed)
 Patient has an OV on 6/3 with Dr. Elvan Hamel.  Disposition noted.

## 2024-05-25 NOTE — Telephone Encounter (Signed)
  Chief Complaint: back pain Symptoms: pain Frequency: constant Pertinent Negatives: Patient denies fever, new injury, tingling, numbness, bowel/bladder issues Disposition: [x] ED /[] Urgent Care (no appt availability in office) / [] Appointment(In office/virtual)/ []  [facility]/ [] Home Care/ [x] Refused Recommended Disposition /[] [place] [facility]/ []  Follow-up with PCP Additional Notes:  Extreme lower back pain. History of back surgery X 8, reinjury in 2024. For one week lower back and tailbone 9/10 pain. She would like referral to pain management clinic. She has previously called and advised to seek emergency room evaluation but she did not go, she then had an E-Visit on 05/22/24 and was advised on face to face visit, she decided against urgent care and would like appointment with pcp. This writer advised due to her severe pain evaluation today but she refuses all option and would like to be seen in office, states "I don't care if I have to wait two weeks, I have been dealing with this pain since I was 68 years old" "if you scheduled me when I called last week I would have already been seen and now my care is being delayed". This Clinical research associate called to [facility] to explain situation and for pcp triage to determine appropriate timing of evaluation, this writer was informed that Dr. Elvan Hamel is out of office, [facility] scheduled next available visit on 05/29/24 with PA Shelvy Dickens, patient accepts this appointment. Educated on care advice as documented in protocol, patient verbalized understanding. Discussed reasons to call back or call for EMS.    Copied from CRM [id number]. Topic: Clinical - Red Word Triage >> May 22, 2024 10:36 AM Lizabeth Riggs wrote: Red Word that prompted transfer to Nurse Triage:  She is having pain in her lower back and tail bone.  She is at a level 9 out 10. >> May 25, 2024  1:06 PM Lizabeth Riggs wrote: She is still having pain 9 out of 10  Reason for Disposition  [1] SEVERE back pain (e.g.,  excruciating) AND [2] sudden onset AND [3] age > 60 years  Protocols used: Back Pain-A-AH

## 2024-05-26 ENCOUNTER — Other Ambulatory Visit (HOSPITAL_COMMUNITY): Payer: Self-pay

## 2024-05-26 ENCOUNTER — Other Ambulatory Visit (HOSPITAL_BASED_OUTPATIENT_CLINIC_OR_DEPARTMENT_OTHER): Payer: Self-pay

## 2024-05-28 ENCOUNTER — Other Ambulatory Visit: Payer: Self-pay | Admitting: Family Medicine

## 2024-05-28 ENCOUNTER — Other Ambulatory Visit: Payer: Self-pay

## 2024-05-28 ENCOUNTER — Other Ambulatory Visit (HOSPITAL_COMMUNITY): Payer: Self-pay

## 2024-05-28 MED ORDER — LISINOPRIL 20 MG PO TABS
20.0000 mg | ORAL_TABLET | Freq: Every day | ORAL | 0 refills | Status: DC
  Filled 2024-05-28: qty 90, 90d supply, fill #0

## 2024-05-29 ENCOUNTER — Other Ambulatory Visit: Payer: Self-pay

## 2024-05-29 ENCOUNTER — Ambulatory Visit (INDEPENDENT_AMBULATORY_CARE_PROVIDER_SITE_OTHER): Admitting: Physician Assistant

## 2024-05-29 ENCOUNTER — Other Ambulatory Visit (HOSPITAL_COMMUNITY): Payer: Self-pay

## 2024-05-29 ENCOUNTER — Encounter: Payer: Self-pay | Admitting: Physician Assistant

## 2024-05-29 VITALS — BP 119/65 | HR 70 | Temp 98.1°F | Resp 18

## 2024-05-29 DIAGNOSIS — Z6841 Body Mass Index (BMI) 40.0 and over, adult: Secondary | ICD-10-CM | POA: Diagnosis not present

## 2024-05-29 DIAGNOSIS — E66813 Obesity, class 3: Secondary | ICD-10-CM

## 2024-05-29 DIAGNOSIS — N3281 Overactive bladder: Secondary | ICD-10-CM

## 2024-05-29 DIAGNOSIS — M549 Dorsalgia, unspecified: Secondary | ICD-10-CM

## 2024-05-29 DIAGNOSIS — K863 Pseudocyst of pancreas: Secondary | ICD-10-CM

## 2024-05-29 DIAGNOSIS — I10 Essential (primary) hypertension: Secondary | ICD-10-CM

## 2024-05-29 DIAGNOSIS — M545 Low back pain, unspecified: Secondary | ICD-10-CM

## 2024-05-29 MED ORDER — LISINOPRIL 20 MG PO TABS
20.0000 mg | ORAL_TABLET | Freq: Every day | ORAL | 0 refills | Status: DC
Start: 2024-05-29 — End: 2024-10-15
  Filled 2024-05-29 – 2024-05-31 (×3): qty 90, 90d supply, fill #0

## 2024-05-29 MED ORDER — TIRZEPATIDE-WEIGHT MANAGEMENT 2.5 MG/0.5ML ~~LOC~~ SOAJ
2.5000 mg | SUBCUTANEOUS | 0 refills | Status: DC
Start: 2024-05-29 — End: 2024-07-23
  Filled 2024-05-29 – 2024-07-11 (×4): qty 2, 28d supply, fill #0

## 2024-05-29 MED ORDER — HYDROCHLOROTHIAZIDE 50 MG PO TABS
50.0000 mg | ORAL_TABLET | ORAL | 1 refills | Status: AC
Start: 2024-05-29 — End: ?
  Filled 2024-05-29 – 2024-05-31 (×3): qty 90, 90d supply, fill #0
  Filled 2024-07-05 – 2024-09-11 (×2): qty 90, 90d supply, fill #1

## 2024-05-29 MED ORDER — LIDOCAINE 5 % EX PTCH
1.0000 | MEDICATED_PATCH | CUTANEOUS | 3 refills | Status: DC
  Filled 2024-05-29 – 2024-06-11 (×4): qty 30, 30d supply, fill #0
  Filled 2024-07-11: qty 30, 30d supply, fill #1
  Filled 2024-09-11: qty 30, 30d supply, fill #2
  Filled 2024-10-15: qty 30, 30d supply, fill #3

## 2024-05-29 MED ORDER — MIRABEGRON ER 50 MG PO TB24
50.0000 mg | ORAL_TABLET | Freq: Every day | ORAL | 0 refills | Status: DC
  Filled 2024-05-29: qty 100, 100d supply, fill #0
  Filled 2024-07-25: qty 90, 90d supply, fill #0
  Filled 2024-10-23: qty 90, 90d supply, fill #1

## 2024-05-29 MED ORDER — METOPROLOL SUCCINATE ER 25 MG PO TB24
25.0000 mg | ORAL_TABLET | Freq: Every day | ORAL | 3 refills | Status: DC
  Filled 2024-05-29 – 2024-05-31 (×3): qty 30, 30d supply, fill #0
  Filled 2024-07-05: qty 30, 30d supply, fill #1
  Filled 2024-07-30: qty 30, 30d supply, fill #2
  Filled 2024-09-11: qty 30, 30d supply, fill #3

## 2024-05-29 MED ORDER — PANCRELIPASE (LIP-PROT-AMYL) 36000-114000 UNITS PO CPEP
36000.0000 [IU] | ORAL_CAPSULE | Freq: Four times a day (QID) | ORAL | 0 refills | Status: AC | PRN
Start: 2024-05-29 — End: ?
  Filled 2024-05-29 – 2024-05-31 (×2): qty 360, 90d supply, fill #0
  Filled 2024-05-31: qty 300, 75d supply, fill #0
  Filled 2024-10-08 – 2024-10-12 (×2): qty 300, 75d supply, fill #1

## 2024-05-29 NOTE — Progress Notes (Signed)
 Patient is here for their 6 month follow-up Patient has concerns today to address with pcp  Care gaps have been discussed with patient

## 2024-05-29 NOTE — Patient Instructions (Signed)
 Drink 64 ounces water  daily.    work at a goal of eliminating sugary drinks, candy, desserts, sweets, refined sugars, processed foods, and white carbohydrates.
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
## 2024-05-30 ENCOUNTER — Encounter: Payer: Self-pay | Admitting: Pulmonary Disease

## 2024-05-30 ENCOUNTER — Other Ambulatory Visit: Payer: Self-pay

## 2024-05-30 ENCOUNTER — Telehealth: Payer: Self-pay

## 2024-05-30 ENCOUNTER — Other Ambulatory Visit (HOSPITAL_COMMUNITY): Payer: Self-pay

## 2024-05-30 NOTE — Telephone Encounter (Signed)
 Pharmacy Patient Advocate Encounter   Received notification from CoverMyMeds that prior authorization for ZEPBOUND  is required/requested.   Insurance verification completed.   The patient is insured through  [employer] .   Per test claim: PA required; PA submitted to above mentioned insurance via CoverMyMeds Key/confirmation #/EOC [id number] Status is pending

## 2024-05-31 ENCOUNTER — Other Ambulatory Visit (HOSPITAL_COMMUNITY): Payer: Self-pay

## 2024-05-31 ENCOUNTER — Other Ambulatory Visit: Payer: Self-pay

## 2024-05-31 NOTE — Telephone Encounter (Signed)
Please advise on PFT?

## 2024-06-01 ENCOUNTER — Other Ambulatory Visit (HOSPITAL_BASED_OUTPATIENT_CLINIC_OR_DEPARTMENT_OTHER): Payer: Self-pay

## 2024-06-04 ENCOUNTER — Encounter: Payer: Self-pay | Admitting: Physician Assistant

## 2024-06-04 ENCOUNTER — Other Ambulatory Visit: Payer: Self-pay

## 2024-06-05 ENCOUNTER — Other Ambulatory Visit (HOSPITAL_COMMUNITY): Payer: Self-pay

## 2024-06-08 ENCOUNTER — Telehealth: Payer: Self-pay | Admitting: Family Medicine

## 2024-06-08 NOTE — Telephone Encounter (Signed)
 error

## 2024-06-11 ENCOUNTER — Telehealth: Payer: Self-pay | Admitting: Family Medicine

## 2024-06-11 ENCOUNTER — Other Ambulatory Visit: Payer: Self-pay

## 2024-06-11 ENCOUNTER — Other Ambulatory Visit (HOSPITAL_COMMUNITY): Payer: Self-pay

## 2024-06-11 NOTE — Telephone Encounter (Signed)
 Copied from CRM [id number]. Topic: Clinical - Prescription Issue >> Jun 11, 2024  1:49 PM Stanly Early wrote: Reason for CRM: lidocaine  (LIDODERM ) 5 % [[id number]] patient is still waiting for the medication, she is very upset and in pain. Please call as patient stated.   Pt. Reports prior authorization for Lidoderm  has not been sent in as requested, and she continues to have pain. Please advise pt.

## 2024-06-11 NOTE — Telephone Encounter (Signed)
 ERROR

## 2024-06-11 NOTE — Telephone Encounter (Signed)
 Copied from CRM [id number]. Topic: Clinical - Prescription Issue >> Jun 11, 2024  1:31 PM Zipporah Him wrote: Reason for CRM: Pattie Borders from [facility] calling about prior authorization that has not been returned and it has been 10 days. States it looks as though a Diagnosis code is needed for the prior authorization on lidocaine  patches. Please call to advise on status.

## 2024-06-12 ENCOUNTER — Other Ambulatory Visit (HOSPITAL_COMMUNITY): Payer: Self-pay

## 2024-06-12 NOTE — Telephone Encounter (Signed)
 I called patient and made her aware This will not be submitted until tomorrow at the earliest. Patient has the option of purchasing the 4% patch over-the-counter or provider may want to prescribe something else/have office staff submit today if needed earlier. If I submit PA tomorrow, her ins has a 72 hour window for expedited requests.

## 2024-06-13 ENCOUNTER — Other Ambulatory Visit: Payer: Self-pay

## 2024-06-13 ENCOUNTER — Telehealth: Payer: Self-pay

## 2024-06-13 ENCOUNTER — Other Ambulatory Visit (HOSPITAL_COMMUNITY): Payer: Self-pay

## 2024-06-13 NOTE — Telephone Encounter (Signed)
 Pharmacy Patient Advocate Encounter  Received notification from [employer] that Prior Authorization for LIDOCAINE  5% PATCH has been APPROVED from 12/28/2023 to 12/26/2024   PA #/Case ID/Reference #: [id number]

## 2024-06-13 NOTE — Telephone Encounter (Signed)
 Refer to other message regarding this

## 2024-06-19 ENCOUNTER — Telehealth: Payer: Self-pay

## 2024-06-19 NOTE — Telephone Encounter (Signed)
 Copied from CRM [id number]. Topic: General - Other >> Jun 19, 2024  4:58 PM Santiya F wrote: Reason for CRM: Patient calling in returning a call from [place] to confirm her appointment tomorrow. Patient says she will be at the office around 8:55 tomorrow.

## 2024-06-20 ENCOUNTER — Telehealth: Admitting: Family Medicine

## 2024-06-20 ENCOUNTER — Encounter: Payer: Self-pay | Admitting: Family Medicine

## 2024-06-20 ENCOUNTER — Telehealth: Payer: Self-pay | Admitting: *Deleted

## 2024-06-20 DIAGNOSIS — L03119 Cellulitis of unspecified part of limb: Secondary | ICD-10-CM

## 2024-06-20 DIAGNOSIS — E6609 Other obesity due to excess calories: Secondary | ICD-10-CM | POA: Diagnosis not present

## 2024-06-20 MED ORDER — CEPHALEXIN 500 MG PO CAPS: 500.0000 mg | ORAL_CAPSULE | Freq: Three times a day (TID) | ORAL | 0 refills | Status: DC

## 2024-06-20 NOTE — Telephone Encounter (Signed)
 Patient call and notified that provider will do a video visit with her today.

## 2024-06-20 NOTE — Telephone Encounter (Signed)
 noted

## 2024-06-20 NOTE — Telephone Encounter (Addendum)
 Patient called [name] [name] regarding a missed call. [name] [name] was informed the call was about the patient's appointment. Please Advise.

## 2024-06-20 NOTE — Progress Notes (Signed)
 Virtual Visit via Video Note  I connected with [name] [name] on 06/20/24 at 11:00 AM EDT by a video enabled telemedicine application and verified that I am speaking with the correct person using two identifiers.  Location: Patient: [name] [name] Provider: [place]   I discussed the limitations of evaluation and management by telemedicine and the availability of in person appointments. The patient expressed understanding and agreed to proceed.  History of Present Illness: Patient reports recurrence of cellulitis on her legs. She denies fever/chills. She also would like info on weight loss.    Observations/Objective: Patient showed areas via virtual  Assessment and Plan: 1. Cellulitis of lower extremity, unspecified laterality (Primary) Keflex  prescribed.   2. Obesity due to excess calories, unspecified class, unspecified whether serious comorbidity present Patient will discuss with pulmonologist  regarding possible use of monjauro with OSA dx or inabililty to medically clear for surgical procedure.     Follow Up Instructions:    I discussed the assessment and treatment plan with the patient. The patient was provided an opportunity to ask questions and all were answered. The patient agreed with the plan and demonstrated an understanding of the instructions.   The patient was advised to call back or seek an in-person evaluation if the symptoms worsen or if the condition fails to improve as anticipated.  I provided 10 minutes of non-face-to-face time during this encounter.   Tanda Raguel SQUIBB, MD

## 2024-06-26 ENCOUNTER — Ambulatory Visit: Admitting: Family Medicine

## 2024-07-04 ENCOUNTER — Ambulatory Visit (HOSPITAL_BASED_OUTPATIENT_CLINIC_OR_DEPARTMENT_OTHER): Attending: Pulmonary Disease | Admitting: Internal Medicine

## 2024-07-04 DIAGNOSIS — G4736 Sleep related hypoventilation in conditions classified elsewhere: Secondary | ICD-10-CM | POA: Diagnosis not present

## 2024-07-04 DIAGNOSIS — R0683 Snoring: Secondary | ICD-10-CM | POA: Diagnosis not present

## 2024-07-04 DIAGNOSIS — G4733 Obstructive sleep apnea (adult) (pediatric): Secondary | ICD-10-CM | POA: Diagnosis not present

## 2024-07-04 DIAGNOSIS — Z6841 Body Mass Index (BMI) 40.0 and over, adult: Secondary | ICD-10-CM | POA: Insufficient documentation

## 2024-07-05 ENCOUNTER — Other Ambulatory Visit: Payer: Self-pay

## 2024-07-05 ENCOUNTER — Other Ambulatory Visit (HOSPITAL_COMMUNITY): Payer: Self-pay

## 2024-07-05 ENCOUNTER — Other Ambulatory Visit: Payer: Self-pay | Admitting: Family Medicine

## 2024-07-09 ENCOUNTER — Other Ambulatory Visit: Payer: Self-pay | Admitting: Family Medicine

## 2024-07-09 DIAGNOSIS — M545 Low back pain, unspecified: Secondary | ICD-10-CM

## 2024-07-10 ENCOUNTER — Other Ambulatory Visit (HOSPITAL_COMMUNITY): Payer: Self-pay

## 2024-07-10 ENCOUNTER — Encounter: Payer: Self-pay | Admitting: Pulmonary Disease

## 2024-07-10 ENCOUNTER — Other Ambulatory Visit: Payer: Self-pay

## 2024-07-10 MED ORDER — DICLOFENAC SODIUM 75 MG PO TBEC
75.0000 mg | DELAYED_RELEASE_TABLET | Freq: Two times a day (BID) | ORAL | 0 refills | Status: DC
  Filled 2024-07-10: qty 60, 30d supply, fill #0

## 2024-07-10 MED ORDER — CYCLOBENZAPRINE HCL 10 MG PO TABS
10.0000 mg | ORAL_TABLET | Freq: Three times a day (TID) | ORAL | 0 refills | Status: DC | PRN
  Filled 2024-07-10: qty 60, 20d supply, fill #0

## 2024-07-11 ENCOUNTER — Other Ambulatory Visit (HOSPITAL_COMMUNITY): Payer: Self-pay

## 2024-07-11 ENCOUNTER — Other Ambulatory Visit: Payer: Self-pay

## 2024-07-11 ENCOUNTER — Other Ambulatory Visit: Payer: Self-pay | Admitting: Family Medicine

## 2024-07-11 DIAGNOSIS — I872 Venous insufficiency (chronic) (peripheral): Secondary | ICD-10-CM

## 2024-07-11 DIAGNOSIS — L03119 Cellulitis of unspecified part of limb: Secondary | ICD-10-CM

## 2024-07-11 NOTE — Telephone Encounter (Signed)
 Pt needs home health referral for wound care

## 2024-07-12 ENCOUNTER — Telehealth: Payer: Self-pay

## 2024-07-12 NOTE — Telephone Encounter (Signed)
 Referral received for home health nursing.  I called the patient to inquire if she has a preferred home health agency and had to leave a message requesting a call back.

## 2024-07-13 ENCOUNTER — Telehealth: Payer: Self-pay | Admitting: Family Medicine

## 2024-07-13 ENCOUNTER — Ambulatory Visit: Payer: Self-pay

## 2024-07-13 NOTE — Telephone Encounter (Signed)
 Please see TE note. HH services obtained with [facility] agency will start on 07/22/2024.

## 2024-07-13 NOTE — Telephone Encounter (Signed)
 Spoke with patient . Patient expressed that she did not have any certain HH agency  that she prefers anyone would be ok. Referral faxed to [facility] . Spoke with Therisa at [facility]. They have accepted patient and said they could start services on 0727/2025. Patient is aware.

## 2024-07-13 NOTE — Telephone Encounter (Signed)
 FYI Only or Action Required?: Action required by provider: clinical question for provider and update on patient condition.  Patient was last seen in primary care on 06/20/2024 by Tanda Bleacher, MD.  Called Nurse Triage reporting Wound Infection.  Symptoms began about a month ago.  Interventions attempted: Prescription medications: keflex .  Symptoms are: gradually worsening.  Triage Disposition: Call PCP Now  Patient/caregiver understands and will follow disposition?: Yes  Copied from CRM [id number]. Topic: Clinical - Red Word Triage >> Jul 13, 2024 12:58 PM Selinda RAMAN wrote: Red Word that prompted transfer to Nurse Triage: The patient has been dealing with a very painful leg wound for quite some time. She was on Keflex  and that has not really helped. She uploaded a picture on her my chart for her provider to see. She recently had a sleep study at [facility] and was told she should go to the Wound Clinic. 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 She is wheelchair bound and it is difficult for her to get around. I will transfer her to E2C2 NT Reason for Disposition  [1] Caller has URGENT question AND [2] triager unable to answer question  Answer Assessment - Initial Assessment Questions This nurse contacted CAL at [facility].  Per [place], contacted CAL at [facility] to reach Slater to check on status of referral for home healthy wound care. Slater is OOO today and Geophysicist/field seismologist, C. Delores was not available. Per Rashima at [facility], will route message to R. Brown in addition to regular clinic routing.    Patient will proceed to ED if she experiences fever, purulent drainage, red streaking, increased swelling or pain, or any other concerning symptoms. She verbalizes that she will contact EMS if needed for transport  1. SYMPTOM: What's the main symptom you're concerned about? (e.g., redness, swelling, pain, fever, weakness)     Swelling, redness, worsening  wounds 2. WOUND INFECTION LOCATION: Where is the wound infection located? (e.g., arm, face, foot, knee, leg)     Back of left leg, from photo in mychart, six lesions present, largest located behind heel 3. WOUND INFECTION SIZE: What is the size of the red area? (e.g., inches, centimeters; compare to size of a coin)      Approximately nickel sized from photo in mychart 4. BETTER-SAME-WORSE: Are you getting better, staying the same, or getting worse compared to the day you started the antibiotics?      worsening 5. PAIN: Do you have any pain?  If Yes, ask: How bad is the pain?  (e.g., Scale 1-10; mild, moderate, or severe)     Stinging and burning, affecting sleep 6. FEVER: Do you have a fever? If Yes, ask: What is it, how was it measured and when did it start?     denies 7. OTHER SYMPTOMS: Do you have any other symptoms? (e.g., pus coming from a wound, red streaks, weakness)     Redness, clear drainage 8. DIAGNOSIS DATE: When was the wound infection diagnosed? By whom?      06/20/2024 9. ANTIBIOTIC NAME: What antibiotic(s) are you taking?  How many times a day? Note: Be sure the patient is taking the antibiotic as directed.      keflex  10. ANTIBIOTIC DATE: When was the antibiotic started?       06/20/2024 11. FOLLOW-UP APPOINTMENT: Do you have a follow-up appointment with your doctor?       Multiple my chart messages sent for follow up.  Protocols used:  Wound Infection on Antibiotic Follow-up Call-A-AH

## 2024-07-13 NOTE — Telephone Encounter (Signed)
 The patient called back to tell Slater she does not heave a preference of home health whatever Slater thinks is best. Please assist patient further

## 2024-07-13 NOTE — Telephone Encounter (Signed)
 E2C2 RN called with pt on line. Nurse stated pt has been sending messages and nobody has answered her. Called pt to gain clarity on her concerns. Pt stated she doesn't know what is going on with her home health aid referral. I advised pt that Slater Diesel is currently working on it. I reminded pt that Slater was waiting for pt's answer on her preference for home health aid, and that now it is pending. Pt stated she is on the other line with my co-worker and if she needs to get back to her call.

## 2024-07-15 NOTE — Procedures (Signed)
 [facility] [address] [place], [place] [address] Tel: [phone]   Fax: [phone]  Split Night Interpretation  Patient Name:  [name] [name], [name] [name] Date:  07/04/2024 Referring Physician:  DORN CHILL [phone]) %%startinterp%% Indications for Polysomnography The patient is a 68 year old Female who is 5' 2 and weighs 381.0 lbs.  Her BMI equals 70.1.  A diagnostic polysomnogram was performed to evaluate for -.  After 122.5 minutes of sleep time the patient exhibited sufficient respiratory events qualifying her for a CPAP trial which was then initiated.    No Medications  No Data.   Polysomnogram Data A full night polysomnogram was performed recording the standard physiologic parameters including EEG, EOG, EMG, EKG, nasal and oral airflow.  Respiratory parameters of chest and abdominal movements are recorded with Peizo-Crystal motion transducers.  Oxygen  saturation was recorded by pulse oximetry.    Sleep Architecture The total recording time of the diagnostic portion of the study was 163.3 minutes.  The total sleep time was 122.5 minutes.  During the diagnostic portion of the study, the patient spent 16.3% of total sleep time in Stage N1, 61.2% in Stage N2, 10.2% in Stages N3, and 12.2% in REM.   Sleep latency was 8.3 minutes.  REM latency was 46.0 minutes.  Sleep Efficiency was 75.0%.  Wake after Sleep Onset time was 32.5 minutes.   At 01:19:54 AM the patient was placed on PAP treatment and was titrated at pressures ranging from Off cm/H20 with supplemental oxygen  at O2: 3.00 up to 24/20/0** cm/H20 with supplemental oxygen  at O2: 4.00.  The total recording time of the treatment portion of the study was 208.2 minutes.  The total sleep time was 162.5 minutes.  During the treatment portion of the study, the patient spent 7.1% of total sleep time in Stage N1, 44.6% in Stage N2, 1.5% in Stages N3, and 46.8% in REM.   Sleep latency was 25.5  minutes.  REM latency was 75.0 minutes.  Sleep Efficiency was 78.0%.  Wake after Sleep Onset time was 20.0 minutes.  Respiratory Events During the diagnostic portion of the study, the polysomnogram revealed a presence of - obstructive, - central, and - mixed apneas resulting in an Apnea index of - events per hour.  There were 52 hypopneas (>=3% desaturation and/or arousal) resulting in an Apnea\Hypopnea Index (AHI >=3% desaturation and/or arousal) of 25.5 events per hour.  There were 33 hypopneas (>=4% desaturation) resulting in an Apnea\Hypopnea Index (AHI >=4% desaturation) of 16.2 events per hour.  There were 28 Respiratory Effort Related Arousals resulting in a RERA index of 13.7 events per hour. The Respiratory Disturbance Index is 39.2 events per hour.  The snore index was - events per hour.  Mean oxygen  saturation was 91.9%.  The lowest oxygen  saturation during sleep was 59.0%.  Time spent <=88% oxygen  saturation was 14.6 minutes (9.5%).  During the treatment portion of the study, the polysomnogram revealed a presence of 44 obstructive, 1 central, and - mixed apneas resulting in an Apnea index of 16.6 events per hour.  There were 59 hypopneas (>=3% desaturation and/or arousal) resulting in an Apnea\Hypopnea Index (AHI >=3% desaturation and/or arousal) of 38.4 events per hour.  There were 36 hypopneas (>=4% desaturation) resulting in an Apnea\Hypopnea Index (AHI >=4% desaturation) of 29.9 events per hour.  There were 3 Respiratory Effort Related Arousals resulting in a RERA index of 1.1 events per hour. The Respiratory Disturbance Index is 39.5 events per hour.  The snore index was - events per hour.  Mean oxygen  saturation was 87.2%.  The lowest oxygen  saturation during sleep was 47.0%.  Time spent <=88% oxygen  saturation was 91.3 minutes (44.1%).  Limb Activity During the diagnostic portion of the study, there were - limb movements recorded.  Of this total, - were classified as PLMs.  Of the PLMs, -  were associated with arousals.  The Limb Movement index was - per hour while the PLM index was - per hour.  During the treatment portion of the study, there were - limb movements recorded.  Of this total, - were classified as PLMs.  Of the PLMs, - were associated with arousals.  The Limb Movement index was - per hour while the PLM index was - per hour.  Cardiac Summary During the diagnostic portion of the study, the average pulse rate was 70.2 bpm.  The minimum pulse rate was 59.0 bpm while the maximum pulse rate was 93.0 bpm.  During the treatment portion of the study, the average pulse rate was 66.8 bpm.  The minimum pulse rate was 55.0 bpm while the maximum pulse rate was 99.0 bpm.   Comment: Moderate to severe obstructive sleep apnea, AHI (4%) 29.9/hr. Supplemental O2 was provided at study onset at 3.5L per protocol, and eventually raised to 4L to keep O2 saturation >/= 90%. Snoring with oxygen  desaturat8ion to a nadir of 59%, mean 91.9%. CPAP did not provide adequate control at tolerated pressure and was changed to bilevel/ BIPAP with final 245/20, PS 0, residual AHI (4%) 0/hr, minimum O2 (with 4L supplement) 92%, mean 93.8%.  Diagnosis: Obstructive sleep apnea, Nocturnal Hypoxemia  Recommendations: BIPAP 24/20, PS0, with supplemental O2 4L. Patient wore a medium  AirFit F40 full face mask with heated humidification.   This study was personally reviewed and electronically signed by: Dr. Reggy Salt Accredited Board Certified in Sleep Medicine Date/Time: 07/15/24 1:39   %%endinterp%%  Split Night Report  Patient Name: [name] [name], [name] [name] Study Date: 07/04/2024  Date of Birth: Jan 12, 1956 Study Type: Split Night  Age: 56 year MRN #: [id number]  Sex: Female Interpreting Physician: SALT REGGY, [id number]  Height: 5' 2 Referring Physician: DORN CHILL [phone])  Weight: 381.0 lbs Recording Tech: Dewane Hacker CRT RPSGT RST  BMI: 70.1 Scoring Tech: Dewane Hacker CRT RPSGT RST  ESS: 11 Neck  Size: 18.5  Mask Type RESMED AIRFIT F40 FFM Final Pressure: 24/20cmH2O  Mask Size: MEDIUM Supplemental O2: 4 LPM   Study Overview  DIAGNOSTIC TREATMENT  Lights Off: 10:36:29 PM Lights Off: 01:19:49 AM  Lights On: 01:19:49 AM Lights On: 04:48:03 AM  Time in Bed: 163.3 min. Time in Bed: 208.2 min.  Total Sleep Time: 122.5 min. Total Sleep Time: 162.5 min.  Sleep Efficiency: 75.0% Sleep Efficiency: 78.0%  Sleep Latency: 8.3 min. Sleep Latency: 25.5 min.  REM Latency from Sleep Onset: 46.0 min. REM Latency from Sleep Onset: 75.0 min.  Wake After Sleep Onset: 32.5 min. Wake After Sleep Onset: 20.0 min.   DIAGNOSTIC TREATMENT   Count Index  Count Index  Awakenings: 24 11.8 Awakenings: 18 6.6  Arousals: 35 17.1 Arousals: 55 20.3  AHI (>=3% Desat and/or Ar.): 52 25.5 AHI (>=3% Desat and/or Ar.): 104 38.4  AHI (>=4% Desat): 33 16.2 AHI (>=4% Desat): 81 29.9   Limb Movements: - - Limb Movements: - -  Snore: - - Snore: - -  Desaturations: 56 27.4 Desaturations: 115 42.5  Minimum SpO2 TST: 59.0% Minimum SpO2 TST: 47.0%    Sleep  Architecture   DIAGNOSTIC TREATMENT ENTIRE NIGHT  Stages Time (mins) % Sleep Time Time (mins) % Sleep Time Time (mins) % Sleep Time  Wake 41.0  46.0  87.0   Stage N1 20.0 16.3% 11.5 7.1% 31.5 11.1%  Stage N2 75.0 61.2% 72.5 44.6% 147.5 51.8%  Stage N3 12.5 10.2% 2.5 1.5% 15.0 5.3%  REM 15.0 12.2% 76.0 46.8% 91.0 31.9%   Arousal Summary   DIAGNOSTIC TREATMENT   NREM REM TST Index NREM REM TST Index  Respiratory Ar. 24 3 27  13.2 41 6 47 17.4  PLM Ar. - - - - - - - -  Isolated Limb Movement Ar. - - - - - - - -  Snore Ar. - - - - - - - -  Spontaneous Ar. 8 - 8 3.9 8 - 8 3.0  Total Ar. 32 3 35 17.1 49 6 55 20.3    Respiratory Summary  DIAGNOSTIC By Sleep Stage By Body Position Total   NREM REM Supine Non-Supine   Time (min) 107.5 15.0 122.5 - 122.5         Obstructive Apnea - - - - -  Mixed Apnea - - - - -  Central Apnea - - - - -  Total Apneas - - - -  -  Total Apnea Index - - - - -         Hypopneas (>=3% Desat and/or Ar.) 43 9 52 - 52  AHI (>=3% Desat and/or Ar.) 24.0 36.0 25.5 - 25.5         Hypopneas (>=4% Desat) 24 9 33 - 33  AHI (>=4% Desat) 13.4 36.0 16.2 - 16.2          RERAs 28 - 28 - 28  RERA Index 15.6 - 13.7 - 13.7         RDI 39.6 36.0 39.2 - 39.2    TREATMENT By Sleep Stage By Body Position Total   NREM REM Supine Non-Supine   Time (min) 86.5 76.0 85.5 77.0 162.5         Obstructive Apnea 41 3 - 44 44  Mixed Apnea - - - - -  Central Apnea 1 - - 1 1  Total Apneas 42 3 - 45 45  Total Apnea Index 29.1 2.4 - 35.1 16.6         Hypopneas (>=3% Desat and/or Ar.) 45 14 16 43 59  AHI (>=3% Desat and/or Ar.) 60.3 13.4 11.2 68.6 38.4         Hypopneas (>=4% Desat) 30 6 6 30  36  AHI (>=4% Desat) 49.9 7.1 4.2 58.4 29.9          RERAs 1 2 2 1 3   RERA Index 0.7 1.6 1.4 0.8 1.1         RDI 61.0 15.0 12.6 69.4 39.5    Respiratory Event Durations   DIAGNOSTIC TREATMENT  Apnea NREM REM NREM REM  Average (seconds) - - 29.2 80.7  Maximum (seconds) - - 173.7 115.5  Hypopnea      Average (seconds) 34.3 64.6 33.3 18.0  Maximum (seconds) 158.7 99.0 58.9 28.3    Limb Movement Summary   DIAGNOSTIC TREATMENT   Count Index Count Index  Isolated Limb Movements - - - -  Periodic Limb Movements (PLMs) - - - -  Total Limb Movements - - - -    Oxygen  Saturation Summary   DIAGNOSTIC TREATMENT   Wake NREM REM TST Wake NREM REM TST  Average  SpO2 93.6% 93.2% 79.2% 91.5% 88.4% 89.7% 83.8% 86.9%  Minimum SpO2 90.0% 72.0% 59.0% 59.0%  61.0% 63.0% 47.0% 47.0%   Maximum SpO2 97.0% 98.0% 92.0% 98.0%  97.0% 97.0% 96.0% 97.0%    DIAGNOSTIC Oxygen  Saturation Distribution  Range (%) Time in range (min) Time in range (%)   90.0 - 100.0 134.4 87.2%  80.0 - 90.0 12.1 7.8%  70.0 - 80.0 4.2 2.7%  60.0 - 70.0 3.3 2.1%  50.0 - 60.0 0.2 0.1%  0.0 - 50.0 - -  Time Spent <=88% SpO2  Range (%) Time in range (min) Time in range (%)   0.0 - 88.0 14.6 9.5%      Count Index  Desaturations: 56 27.4   TREATMENT Oxygen  Saturation Distribution  Range (%) Time in range (min) Time in range (%)   90.0 - 100.0 76.7 37.0%  80.0 - 90.0 105.3 50.8%  70.0 - 80.0 14.7 7.1%  60.0 - 70.0 7.3 3.5%  50.0 - 60.0 2.8 1.4%  0.0 - 50.0 0.4 0.2%  Time Spent <=88% SpO2  Range (%) Time in range (min) Time in range (%)  0.0 - 88.0 91.3 44.1%      Count Index  Desaturations: 115 42.5     Cardiac Summary   DIAGNOSTIC TREATMENT   Wake NREM REM Total Wake NREM REM Total  Average Pulse Rate (BPM) 72.5 68.8 76.0 70.2 68.3 62.8 70.5 66.8  Minimum Pulse Rate (BPM) 60.0 59.0 60.0 59.0 57.0 55.0 57.0 55.0  Maximum Pulse Rate (BPM) 87.0 90.0 93.0 93.0 97.0 87.0 99.0 99.0   Pulse Rate Distribution   DIAGNOSTIC  Range (bpm) Time in range (min) Time in range (%)  0.0 - 40.0 - -  40.0 - 60.0 1.0 0.6%  60.0 - 80.0 144.0 93.4%  80.0 - 100.0 8.0 5.2%  100.0 - 120.0 - -  120.0 - 140.0 - -  140.0 - 200.0 - -   TREATMENT  Range (bpm) Time in range (min) Time in range (%)  0.0 - 40.0 - -  40.0 - 60.0 28.9 13.9%  60.0 - 80.0 169.2 81.5%  80.0 - 100.0 6.9 3.3%  100.0 - 120.0 - -  120.0 - 140.0 - -  140.0 - 200.0 - -    Titration Summary  PAP Device PAP Level O2 Level Time (min) Wake (min) NREM (min) REM (min) Sleep Eff% OA# CA# MA# Hyp# (>=3%) AHI (>=3%) Hyp# (>=4%) AHI (>=%4) RERA RDI OSat <=88% (min) Min [employer] Ar. Index  - Off - 0.5 0.5 0.0 0.0 0.0%               - Off O2: 3.50 163.0 40.5 107.5 15.0 75.2% - - - 52 25.5 33  16.2 28  39.2  14.6 59.0 91.5 17.1  CPAP EPR 5/2 O2: 3.50 41.5 28.5 13.0 0.0 31.3% 20 - - 3 106.2 -  92.3 -  106.2  4.5 80.0 89.0 78.5  CPAP EPR 7/2 O2: 3.50 14.0 7.0 7.0 0.0 50.0% 6 - - 7 111.4 6  102.9 1  120.0  2.3 82.0 89.1 77.1  CPAP EPR 9/2 O2: 3.50 9.5 0.0 9.5 0.0 100.0% 1 - - 11 75.8 11  75.8 -  75.8  3.6 85.0 89.4 18.9  CPAP EPR 11/2 O2: 3.50 10.0 0.0 10.0 0.0 100.0% 11 - - 3 84.0 3  84.0  -  84.0  3.0 84.0 90.0 60.0  CPAP EPR 13/2 O2: 3.50 10.5 0.0 10.5 0.0 100.0% 1 - -  8 51.4 5  34.3 -  51.4  0.8 85.0 90.2 5.7  CPAP EPR 15/2 O2: 3.50 10.0 0.0 10.0 0.0 100.0% - - - 3 18.0 -  - -  18.0  0.4 88.0 90.1 -  CPAP EPR 16/2 O2: 3.50 10.0 0.0 5.0 5.0 100.0% 3 - - 4 42.0 2  30.0 -  42.0  5.1 56.0 80.3 6.0  CPAP EPR 18/2 O2: 3.50 20.0 6.0 7.5 6.5 70.0% 2 1 - 8 47.1 5  34.3 -  47.1  12.7 47.0 79.2 38.6  CPAP EPR 19/2 O2: 3.50 10.0 0.0 0.0 10.0 100.0% - - - 5 30.0 2  12.0 -  30.0  10.0 60.0 72.7 -  CPAP EPR 20/2 O2: 3.50 22.5 0.0 0.0 22.5 100.0% - - - 6 16.0 2  5.3 1  18.7  19.8 72.0 82.9 5.3  BiLevel 24/20/0 O2: 3.50 7.0 1.0 0.0 6.0 85.7% - - - - - -  - -  -  6.0 79.0 82.0 -  BiLevel 24/20/0 O2: 4.00 43.5 3.5 14.0 26.0 92.0% - - - 1 1.5 -  - 1  3.0  0.0 92.0 93.8 4.5   Hypnograms                           Technologist Comments  THE 77-YEAR-OLD FEMALE PATIENT PRESENTED TO ROOM 6 IN THE SLEEP DISORDER CENTER, WITH 3.5 LPM OF OXYGEN  VIA NASAL CANNULA, FOR A SPLIT NIGHT STUDY WITH A CHIEF COMPLAINT OF OSA, SNORING AND BMI 70 AND OVER. THE PATIENT WAS FITTED WITH A MEDIUM RESMED AIRFIT F40 FFM FOR PRE-CPAP TRIAL, WHICH WAS TOLERATED WELL. NO BEDTIME MEDICATIONS WERE SELF ADMINISTERED. MODERATE AUDIBLE SNORING WAS OBSERVED DURING THE STUDY. NO PLMs - PLMAs WERE NOTED. SUPPLEMENTAL OXYGEN  WAS CONTINUED AT 3.5 LPM NASAL CANNULA DURING THE STUDY, PER OXYGEN  PROTOCOL. NO OBVIOUS PARASOMNIAs WERE OBSERVED. ONE RESTROOM VISIT WAS MADE. NO OBVIOUS CARDIAC ARRHYTHMIAS WERE OBSERVED. SPLIT NIGHT CRITERIA WAS MET AT 0108. THE PATIENT WAS PLACED ON CPAP UTILIZING THE ABOVE MASK TYPE ON A PRESSURE OF 5cmH2O WITH AN EPR OF 2cmH2O, 3.5 LPM OF OXYGEN  BLED IN AND HEATED HUMIDIFICATION. THE CPAP PRESSURE WAS INCREASED FOR SNORING, RESPIRATORY EVENTS AND RERAs UNTIL A PRESSURE OF 20cmH2O. THE PATIENT WAS THEN CHANGED TO BILEVEL MODE FOR INCREASED PRESSURES NEEDED TO ELIMINATE  RESPIRATORY EVENTS. THE BILEVEL PRESSURE WAS INCREASED UNTIL OPTIMAL PRESSURE WAS ACHIEVED. AT 0405, THE OXYGEN  WAS INCREASED TO 4 LPM TO MAINTAIN SATURATIONS ABOVE 90%. THE PATIENT TOLERATED THE CPAP/ BILEVEL TRIAL WELL.                          Reggy Salt Diplomate, Biomedical engineer of Sleep Medicine  ELECTRONICALLY SIGNED ON:  07/15/2024, 1:40 PM [facility] PH: [phone]   FX: [phone] ACCREDITED BY THE AMERICAN ACADEMY OF SLEEP MEDICINE

## 2024-07-15 NOTE — Procedures (Signed)
 Indications for Polysomnography The patient is a 68 year old Female who is 5' 2 and weighs 381.0 lbs.  Her BMI equals 70.1.  A diagnostic polysomnogram was performed to evaluate for -.  After 122.5 minutes of sleep time the patient exhibited sufficient respiratory events qualifying  her for a CPAP trial which was then initiated.  No MedicationsNo Data. Polysomnogram Data A full night polysomnogram was performed recording the standard physiologic parameters including EEG, EOG, EMG, EKG, nasal and oral airflow.  Respiratory parameters of chest and abdominal movements are recorded with Peizo-Crystal motion transducers.   Oxygen  saturation was recorded by pulse oximetry.  Sleep Architecture The total recording time of the diagnostic portion of the study was 163.3 minutes.  The total sleep time was 122.5 minutes.  During the diagnostic portion of the study, the patient spent 16.3% of total sleep time in Stage N1, 61.2% in Stage N2, 10.2% in  Stages N3, and 12.2% in REM.   Sleep latency was 8.3 minutes.  REM latency was 46.0 minutes.  Sleep Efficiency was 75.0%.  Wake after Sleep Onset time was 32.5 minutes.  At 01:19:54 AM the patient was placed on PAP treatment and was titrated at pressures ranging from Off cm/H20 with supplemental oxygen  at O2: 3.00 up to 24/20/0** cm/H20 with supplemental oxygen  at O2: 4.00.  The total recording time of the treatment  portion of the study was 208.2 minutes.  The total sleep time was 162.5 minutes.  During the treatment portion of the study, the patient spent 7.1% of total sleep time in Stage N1, 44.6% in Stage N2, 1.5% in Stages N3, and 46.8% in REM.   Sleep latency  was 25.5 minutes.  REM latency was 75.0 minutes.  Sleep Efficiency was 78.0%.  Wake after Sleep Onset time was 20.0 minutes.  Respiratory Events During the diagnostic portion of the study, the polysomnogram revealed a presence of - obstructive, - central, and - mixed apneas resulting in an Apnea index  of - events per hour.  There were 52 hypopneas (GreaterEqual to3% desaturation and/or arousal)  resulting in an Apnea\Hypopnea Index (AHI GreaterEqual to3% desaturation and/or arousal) of 25.5 events per hour.  There were 33 hypopneas (GreaterEqual to4% desaturation) resulting in an Apnea\Hypopnea Index (AHI GreaterEqual to4% desaturation) of 16.2  events per hour.  There were 28 Respiratory Effort Related Arousals resulting in a RERA index of 13.7 events per hour. The Respiratory Disturbance Index is 39.2 events per hour.  The snore index was - events per hour.  Mean oxygen  saturation was 91.9%.   The lowest oxygen  saturation during sleep was 59.0%.  Time spent LessEqual to88% oxygen  saturation was  minutes ().  During the treatment portion of the study, the polysomnogram revealed a presence of 44 obstructive, 1 central, and - mixed apneas resulting in an Apnea index of 16.6 events per hour.  There were 59 hypopneas (GreaterEqual to3% desaturation and/or  arousal) resulting in an Apnea\Hypopnea Index (AHI GreaterEqual to3% desaturation and/or arousal) of 38.4 events per hour.  There were 36 hypopneas (GreaterEqual to4% desaturation) resulting in an Apnea\Hypopnea Index (AHI GreaterEqual to4% desaturation)  of 29.9 events per hour.  There were 3 Respiratory Effort Related Arousals resulting in a RERA index of 1.1 events per hour. The Respiratory Disturbance Index is 39.5 events per hour.  The snore index was - events per hour.  Mean oxygen  saturation was  87.2%.  The lowest oxygen  saturation during sleep was 47.0%.  Time spent LessEqual to88% oxygen  saturation was  minutes ().  Limb Activity During the diagnostic portion of the study, there were - limb movements recorded.  Of this total, - were classified as PLMs.  Of the PLMs, - were associated with arousals.  The Limb Movement index was - per hour while the PLM index was - per hour.  During the treatment portion of the study, there were - limb  movements recorded.  Of this total, - were classified as PLMs.  Of the PLMs, - were associated with arousals.  The Limb Movement index was - per hour while the PLM index was - per hour.  Cardiac Summary During the diagnostic portion of the study, the average pulse rate was 70.2 bpm.  The minimum pulse rate was 59.0 bpm while the maximum pulse rate was 93.0 bpm.  During the treatment portion of the study, the average pulse rate was 66.8 bpm.  The minimum pulse rate was 55.0 bpm while the maximum pulse rate was 99.0 bpm.  Diagnosis:  Recommendations:   This study was personally reviewed and electronically signed by: Dr. Reggy Salt Accredited Board Certified in Sleep Medicine Date/Time:

## 2024-07-16 ENCOUNTER — Telehealth: Payer: Self-pay | Admitting: Family Medicine

## 2024-07-16 ENCOUNTER — Ambulatory Visit: Payer: Self-pay

## 2024-07-16 NOTE — Telephone Encounter (Signed)
 Spilt night sleep study results are scanned into Epic

## 2024-07-16 NOTE — Telephone Encounter (Signed)
 FYI Only or Action Required?: Action required by provider: clinical question for provider and update on patient condition.  Patient was last seen in primary care on 06/20/2024 by Tanda Bleacher, MD.  Called Nurse Triage reporting Wound Infection  Symptoms began about a month ago.  Interventions attempted: Rest, hydration, or home remedies and Ice/heat application.  Symptoms are: gradually worsening.  Triage Disposition: See HCP Within 4 Hours (Or PCP Triage)  Patient/caregiver understands and will follow disposition?: No, wishes to speak with PCP       Copied from CRM [id number]. Topic: Clinical - Red Word Triage >> Jul 13, 2024 12:58 PM Selinda RAMAN wrote: Red Word that prompted transfer to Nurse Triage: The patient has been dealing with a very painful leg wound for quite some time. She was on Keflex  and that has not really helped. She uploaded a picture on her my chart for her provider to see. She recently had a sleep study at [facility] and was told she should go to [facility]. 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 She is wheelchair bound and it is difficult for her to get around. I will transfer her to E2C2 NT >> Jul 16, 2024 10:10 AM Corin V wrote: Patient called back. She was under the impression home health was coming yesterday, not next Sunday. No change in symtpoms.  Reason for Disposition  [1] Overly worried caller AND [2] third call within 48 hours about the same medical problem  Answer Assessment - Initial Assessment Questions 1. SITUATION:  Document reason for call.     Upset with care 2. BACKGROUND: Document any background information (e.g., prior calls, known psychiatric history)     Multiple prior calls 3. ASSESSMENT: Document your nursing assessment.     Pt is frustrated that home health did not come out over the weekend  4. RESPONSE: Document what your response or recommendation was.     Transferred to [place] at [facility]  Protocols used: Difficult Call-A-AH

## 2024-07-16 NOTE — Telephone Encounter (Signed)
 Call to [facility] spoke with Marka. Yolander apologized. She said they had to move patient to today due to a change in the availability  of having someone to start new patient's on Sunday. Helga said that she is call the patient today.

## 2024-07-16 NOTE — Telephone Encounter (Signed)
 Copied from CRM [id number]. Topic: Referral - Question >> Jul 16, 2024  3:11 PM Deleta RAMAN wrote: Reason for CRM: [facility] is calling due to the fax being sent over was incomplete. Would also like to know if patient had any other visit than tele visit. The home health rep would like notes of the office visit on June 25th. You can reach the rep dana k at [phone].

## 2024-07-16 NOTE — Telephone Encounter (Signed)
 Spoke with patient and she stated she was upset that [facility] hasn't came out yet I assured her that I would call and get some answers for her. Called [facility] and they said they are coming out tomorrow to see patient couldn't give me a direct time, I called patient back and let her know it will be tomorrow and and to just be ready for anytime they may come patient stated she understood and thanked me.

## 2024-07-17 ENCOUNTER — Telehealth: Payer: Self-pay | Admitting: Family Medicine

## 2024-07-17 ENCOUNTER — Telehealth: Payer: Self-pay | Admitting: *Deleted

## 2024-07-17 DIAGNOSIS — L03119 Cellulitis of unspecified part of limb: Secondary | ICD-10-CM | POA: Diagnosis not present

## 2024-07-17 DIAGNOSIS — G43909 Migraine, unspecified, not intractable, without status migrainosus: Secondary | ICD-10-CM | POA: Diagnosis not present

## 2024-07-17 DIAGNOSIS — I872 Venous insufficiency (chronic) (peripheral): Secondary | ICD-10-CM | POA: Diagnosis not present

## 2024-07-17 DIAGNOSIS — G8929 Other chronic pain: Secondary | ICD-10-CM | POA: Diagnosis not present

## 2024-07-17 DIAGNOSIS — M170 Bilateral primary osteoarthritis of knee: Secondary | ICD-10-CM | POA: Diagnosis not present

## 2024-07-17 DIAGNOSIS — F32A Depression, unspecified: Secondary | ICD-10-CM | POA: Diagnosis not present

## 2024-07-17 DIAGNOSIS — I10 Essential (primary) hypertension: Secondary | ICD-10-CM | POA: Diagnosis not present

## 2024-07-17 DIAGNOSIS — M479 Spondylosis, unspecified: Secondary | ICD-10-CM | POA: Diagnosis not present

## 2024-07-17 DIAGNOSIS — L97221 Non-pressure chronic ulcer of left calf limited to breakdown of skin: Secondary | ICD-10-CM | POA: Diagnosis not present

## 2024-07-17 NOTE — Telephone Encounter (Signed)
 Called back. LVM for Dana K.  Faxed over the telehealth visit from June 25 and in office visit from June

## 2024-07-17 NOTE — Telephone Encounter (Unsigned)
 Copied from CRM [id number]. Topic: General - Other >> Jul 17, 2024  4:18 PM Zebedee SAUNDERS wrote: Reason for CRM: Received call from [facility] per [place] returning Sidney's call.

## 2024-07-17 NOTE — Telephone Encounter (Signed)
 Follow- up to  patient. Patient voiced that she did receive a call yesterday and someone scheduled to come out today.

## 2024-07-17 NOTE — Telephone Encounter (Signed)
 Yes, requesting a vascular consult for the patient, due to wound, possibly venous stasis. Also request for New power wheelchair.   Provider is aware of request verbally

## 2024-07-18 NOTE — Telephone Encounter (Signed)
 Corean with [facility] is requesting a referral for the  patient for possible venous stasis  Corean is also  using silver altgorate and border foam for patient's wound.  Patient is requesting a new power wheelchair referral to [facility] as well  Please advise patient

## 2024-07-20 DIAGNOSIS — I872 Venous insufficiency (chronic) (peripheral): Secondary | ICD-10-CM | POA: Diagnosis not present

## 2024-07-20 DIAGNOSIS — G43909 Migraine, unspecified, not intractable, without status migrainosus: Secondary | ICD-10-CM | POA: Diagnosis not present

## 2024-07-20 DIAGNOSIS — M479 Spondylosis, unspecified: Secondary | ICD-10-CM | POA: Diagnosis not present

## 2024-07-20 DIAGNOSIS — M170 Bilateral primary osteoarthritis of knee: Secondary | ICD-10-CM | POA: Diagnosis not present

## 2024-07-20 DIAGNOSIS — L97221 Non-pressure chronic ulcer of left calf limited to breakdown of skin: Secondary | ICD-10-CM | POA: Diagnosis not present

## 2024-07-20 DIAGNOSIS — I10 Essential (primary) hypertension: Secondary | ICD-10-CM | POA: Diagnosis not present

## 2024-07-20 DIAGNOSIS — F32A Depression, unspecified: Secondary | ICD-10-CM | POA: Diagnosis not present

## 2024-07-20 DIAGNOSIS — G8929 Other chronic pain: Secondary | ICD-10-CM | POA: Diagnosis not present

## 2024-07-20 DIAGNOSIS — L03119 Cellulitis of unspecified part of limb: Secondary | ICD-10-CM | POA: Diagnosis not present

## 2024-07-20 NOTE — Telephone Encounter (Signed)
 Copied from CRM [id number]. Topic: Clinical - Home Health Verbal Orders >> Jul 20, 2024  4:05 PM Sasha H wrote: Caller/Agency: Randy/[facility] Callback Number: [phone] Service Requested: Skilled Nursing Frequency:  Any new concerns about the patient? Yes, wound size increased in width but not length or depth

## 2024-07-20 NOTE — Telephone Encounter (Signed)
 [facility]  call to report the patient has had increase in the size of her wound . Wound now 2.0 cm x 2.3 cm depth 0.1 cm

## 2024-07-23 ENCOUNTER — Encounter: Payer: Self-pay | Admitting: Pulmonary Disease

## 2024-07-23 ENCOUNTER — Ambulatory Visit: Admitting: Pulmonary Disease

## 2024-07-23 ENCOUNTER — Other Ambulatory Visit (HOSPITAL_COMMUNITY): Payer: Self-pay

## 2024-07-23 VITALS — BP 127/72 | HR 73 | Ht 62.0 in | Wt 382.0 lb

## 2024-07-23 DIAGNOSIS — J9611 Chronic respiratory failure with hypoxia: Secondary | ICD-10-CM

## 2024-07-23 DIAGNOSIS — M170 Bilateral primary osteoarthritis of knee: Secondary | ICD-10-CM | POA: Diagnosis not present

## 2024-07-23 DIAGNOSIS — J4530 Mild persistent asthma, uncomplicated: Secondary | ICD-10-CM

## 2024-07-23 DIAGNOSIS — G4733 Obstructive sleep apnea (adult) (pediatric): Secondary | ICD-10-CM

## 2024-07-23 DIAGNOSIS — I10 Essential (primary) hypertension: Secondary | ICD-10-CM | POA: Diagnosis not present

## 2024-07-23 DIAGNOSIS — G43909 Migraine, unspecified, not intractable, without status migrainosus: Secondary | ICD-10-CM | POA: Diagnosis not present

## 2024-07-23 DIAGNOSIS — E66813 Obesity, class 3: Secondary | ICD-10-CM

## 2024-07-23 DIAGNOSIS — Z6841 Body Mass Index (BMI) 40.0 and over, adult: Secondary | ICD-10-CM | POA: Diagnosis not present

## 2024-07-23 DIAGNOSIS — M479 Spondylosis, unspecified: Secondary | ICD-10-CM | POA: Diagnosis not present

## 2024-07-23 DIAGNOSIS — L97221 Non-pressure chronic ulcer of left calf limited to breakdown of skin: Secondary | ICD-10-CM | POA: Diagnosis not present

## 2024-07-23 DIAGNOSIS — G8929 Other chronic pain: Secondary | ICD-10-CM | POA: Diagnosis not present

## 2024-07-23 DIAGNOSIS — F32A Depression, unspecified: Secondary | ICD-10-CM | POA: Diagnosis not present

## 2024-07-23 DIAGNOSIS — L03119 Cellulitis of unspecified part of limb: Secondary | ICD-10-CM | POA: Diagnosis not present

## 2024-07-23 DIAGNOSIS — I872 Venous insufficiency (chronic) (peripheral): Secondary | ICD-10-CM | POA: Diagnosis not present

## 2024-07-23 MED ORDER — TIRZEPATIDE-WEIGHT MANAGEMENT 2.5 MG/0.5ML ~~LOC~~ SOAJ
2.5000 mg | SUBCUTANEOUS | 0 refills | Status: DC
  Filled 2024-07-23 – 2024-07-27 (×2): qty 2, 28d supply, fill #0
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
## 2024-07-23 NOTE — Patient Instructions (Addendum)
 We will order your a Bipap Machine and Supplies: BIPAP 24/20, PS0, with supplemental O2 4L. Medium AirFit F40 full face mask with heated humidification.   Continue budesonide  nebulizer twice daily  Continue supplemental Oxygen  to maintain oxygen  saturations above 88%  We will work on getting you qualified for Zepbound  for weight loss for the sleep apnea  Continue to work on weight loss  Follow up in 4 months, call sooner if needed

## 2024-07-24 ENCOUNTER — Other Ambulatory Visit (HOSPITAL_COMMUNITY): Payer: Self-pay

## 2024-07-24 ENCOUNTER — Encounter: Payer: Self-pay | Admitting: Pulmonary Disease

## 2024-07-25 ENCOUNTER — Other Ambulatory Visit (HOSPITAL_COMMUNITY): Payer: Self-pay

## 2024-07-25 ENCOUNTER — Other Ambulatory Visit: Payer: Self-pay

## 2024-07-25 DIAGNOSIS — M170 Bilateral primary osteoarthritis of knee: Secondary | ICD-10-CM | POA: Diagnosis not present

## 2024-07-25 DIAGNOSIS — G8929 Other chronic pain: Secondary | ICD-10-CM | POA: Diagnosis not present

## 2024-07-25 DIAGNOSIS — F32A Depression, unspecified: Secondary | ICD-10-CM | POA: Diagnosis not present

## 2024-07-25 DIAGNOSIS — I872 Venous insufficiency (chronic) (peripheral): Secondary | ICD-10-CM | POA: Diagnosis not present

## 2024-07-25 DIAGNOSIS — L97221 Non-pressure chronic ulcer of left calf limited to breakdown of skin: Secondary | ICD-10-CM | POA: Diagnosis not present

## 2024-07-25 DIAGNOSIS — G43909 Migraine, unspecified, not intractable, without status migrainosus: Secondary | ICD-10-CM | POA: Diagnosis not present

## 2024-07-25 DIAGNOSIS — M479 Spondylosis, unspecified: Secondary | ICD-10-CM | POA: Diagnosis not present

## 2024-07-25 DIAGNOSIS — L03119 Cellulitis of unspecified part of limb: Secondary | ICD-10-CM | POA: Diagnosis not present

## 2024-07-25 DIAGNOSIS — I10 Essential (primary) hypertension: Secondary | ICD-10-CM | POA: Diagnosis not present

## 2024-07-26 ENCOUNTER — Telehealth (HOSPITAL_COMMUNITY): Payer: Self-pay

## 2024-07-26 ENCOUNTER — Other Ambulatory Visit (HOSPITAL_COMMUNITY): Payer: Self-pay

## 2024-07-27 ENCOUNTER — Telehealth: Payer: Self-pay

## 2024-07-27 ENCOUNTER — Other Ambulatory Visit (HOSPITAL_COMMUNITY): Payer: Self-pay

## 2024-07-27 ENCOUNTER — Telehealth: Payer: Self-pay | Admitting: Pharmacist

## 2024-07-27 ENCOUNTER — Other Ambulatory Visit: Payer: Self-pay

## 2024-07-27 NOTE — Telephone Encounter (Signed)
 Appeal has been submitted for Zepbound . Will advise when response is received, please be advised that most companies may take 30 days to make a decision. Appeal letter, sleep study and other supporting documentation have been faxed to the insurance at [phone] on 07/27/2024 @12 :00 pm.  Thank you, Devere Pandy, PharmD Clinical Pharmacist  [place]  Direct Dial: [phone]

## 2024-07-27 NOTE — Telephone Encounter (Signed)
*  Pulm  Pharmacy Patient Advocate Encounter  Received notification from [employer] that Prior Authorization for Zepbound  2.5MG /0.5ML pen-injectors  has been CANCELLED due to previous denial on file.    Denial letter 05/30/2024 in patients media.

## 2024-07-27 NOTE — Telephone Encounter (Signed)
 Insurance has approved the appeal for Zepbound:    Thank you, Dellie Burns, PharmD Clinical Pharmacist  [place]  Direct Dial: [phone]

## 2024-07-29 ENCOUNTER — Other Ambulatory Visit: Payer: Self-pay | Admitting: Medical Genetics

## 2024-07-30 ENCOUNTER — Other Ambulatory Visit: Payer: Self-pay | Admitting: Family Medicine

## 2024-07-30 ENCOUNTER — Other Ambulatory Visit (HOSPITAL_BASED_OUTPATIENT_CLINIC_OR_DEPARTMENT_OTHER): Payer: Self-pay

## 2024-07-30 ENCOUNTER — Other Ambulatory Visit (HOSPITAL_COMMUNITY): Payer: Self-pay

## 2024-07-30 MED ORDER — TRAMADOL HCL 50 MG PO TABS
50.0000 mg | ORAL_TABLET | Freq: Two times a day (BID) | ORAL | 0 refills | Status: DC | PRN
  Filled 2024-07-30: qty 30, 15d supply, fill #0

## 2024-07-31 ENCOUNTER — Ambulatory Visit: Admitting: Internal Medicine

## 2024-07-31 ENCOUNTER — Other Ambulatory Visit (HOSPITAL_COMMUNITY): Payer: Self-pay

## 2024-08-01 ENCOUNTER — Telehealth: Payer: Self-pay

## 2024-08-01 DIAGNOSIS — I10 Essential (primary) hypertension: Secondary | ICD-10-CM | POA: Diagnosis not present

## 2024-08-01 DIAGNOSIS — G43909 Migraine, unspecified, not intractable, without status migrainosus: Secondary | ICD-10-CM | POA: Diagnosis not present

## 2024-08-01 DIAGNOSIS — F32A Depression, unspecified: Secondary | ICD-10-CM | POA: Diagnosis not present

## 2024-08-01 DIAGNOSIS — L03119 Cellulitis of unspecified part of limb: Secondary | ICD-10-CM | POA: Diagnosis not present

## 2024-08-01 DIAGNOSIS — M479 Spondylosis, unspecified: Secondary | ICD-10-CM | POA: Diagnosis not present

## 2024-08-01 DIAGNOSIS — G8929 Other chronic pain: Secondary | ICD-10-CM | POA: Diagnosis not present

## 2024-08-01 DIAGNOSIS — L97221 Non-pressure chronic ulcer of left calf limited to breakdown of skin: Secondary | ICD-10-CM | POA: Diagnosis not present

## 2024-08-01 DIAGNOSIS — I872 Venous insufficiency (chronic) (peripheral): Secondary | ICD-10-CM | POA: Diagnosis not present

## 2024-08-01 DIAGNOSIS — M170 Bilateral primary osteoarthritis of knee: Secondary | ICD-10-CM | POA: Diagnosis not present

## 2024-08-01 NOTE — Telephone Encounter (Signed)
 [facility] nurse is requesting HH orders for wound care and pain management.

## 2024-08-01 NOTE — Telephone Encounter (Signed)
 Copied from CRM [id number]. Topic: General - Other >> Aug 01, 2024  3:34 PM Myrick T wrote: Reason for CRM: Nat called from [facility] stated she see patient 1x a week. She says she would like to help her with wound care and help manage her pain but she needs an order from the provider. Patient has a lot of swelling in both legs with edema, one leg is weeping water  and she would love to assist her but she just need orders. Please f/u with Nat at [phone]

## 2024-08-02 ENCOUNTER — Ambulatory Visit: Admitting: Family Medicine

## 2024-08-02 DIAGNOSIS — M545 Low back pain, unspecified: Secondary | ICD-10-CM | POA: Diagnosis not present

## 2024-08-02 DIAGNOSIS — Z114 Encounter for screening for human immunodeficiency virus [HIV]: Secondary | ICD-10-CM | POA: Diagnosis not present

## 2024-08-02 DIAGNOSIS — G4733 Obstructive sleep apnea (adult) (pediatric): Secondary | ICD-10-CM | POA: Diagnosis not present

## 2024-08-02 DIAGNOSIS — Z1159 Encounter for screening for other viral diseases: Secondary | ICD-10-CM | POA: Diagnosis not present

## 2024-08-02 DIAGNOSIS — Z0189 Encounter for other specified special examinations: Secondary | ICD-10-CM | POA: Diagnosis not present

## 2024-08-02 DIAGNOSIS — J9611 Chronic respiratory failure with hypoxia: Secondary | ICD-10-CM | POA: Diagnosis not present

## 2024-08-02 DIAGNOSIS — Z79899 Other long term (current) drug therapy: Secondary | ICD-10-CM | POA: Diagnosis not present

## 2024-08-02 DIAGNOSIS — R7303 Prediabetes: Secondary | ICD-10-CM | POA: Diagnosis not present

## 2024-08-03 ENCOUNTER — Telehealth: Payer: Self-pay | Admitting: Family Medicine

## 2024-08-03 ENCOUNTER — Other Ambulatory Visit (HOSPITAL_COMMUNITY): Payer: Self-pay

## 2024-08-03 NOTE — Telephone Encounter (Signed)
 Spoke with Nat - [facility] Nurse). She reports that patient needs PT/OT  as well as wound management. Patient is trying to transfer care to another PCP. She will speak to patient regarding that and if so we will do her care for the next 30 days. Nurse voiced understanding. She wants to be the contact for patient. She will follow up whether patient will be transferring care. SABRA

## 2024-08-04 ENCOUNTER — Other Ambulatory Visit: Payer: Self-pay

## 2024-08-04 ENCOUNTER — Emergency Department (HOSPITAL_BASED_OUTPATIENT_CLINIC_OR_DEPARTMENT_OTHER)
Admission: EM | Admit: 2024-08-04 | Discharge: 2024-08-04 | Disposition: A | Discharge status: Home / Self Care | Attending: Emergency Medicine | Admitting: Emergency Medicine

## 2024-08-04 ENCOUNTER — Encounter (HOSPITAL_BASED_OUTPATIENT_CLINIC_OR_DEPARTMENT_OTHER): Payer: Self-pay

## 2024-08-04 DIAGNOSIS — Z79899 Other long term (current) drug therapy: Secondary | ICD-10-CM | POA: Diagnosis not present

## 2024-08-04 DIAGNOSIS — J45909 Unspecified asthma, uncomplicated: Secondary | ICD-10-CM | POA: Diagnosis not present

## 2024-08-04 DIAGNOSIS — I10 Essential (primary) hypertension: Secondary | ICD-10-CM | POA: Diagnosis not present

## 2024-08-04 DIAGNOSIS — Z87891 Personal history of nicotine dependence: Secondary | ICD-10-CM | POA: Insufficient documentation

## 2024-08-04 DIAGNOSIS — L03116 Cellulitis of left lower limb: Secondary | ICD-10-CM | POA: Insufficient documentation

## 2024-08-04 DIAGNOSIS — M79662 Pain in left lower leg: Secondary | ICD-10-CM | POA: Diagnosis present

## 2024-08-04 MED ORDER — DOXYCYCLINE HYCLATE 100 MG PO CAPS
100.0000 mg | ORAL_CAPSULE | Freq: Two times a day (BID) | ORAL | 0 refills | Status: DC
  Filled 2024-08-04: qty 20, 10d supply, fill #0

## 2024-08-04 MED ORDER — DOXYCYCLINE HYCLATE 100 MG PO TABS
100.0000 mg | ORAL_TABLET | Freq: Once | ORAL | Status: AC
  Administered 2024-08-04: 100 mg via ORAL
  Filled 2024-08-04: qty 1
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
## 2024-08-04 NOTE — Discharge Instructions (Signed)
 I have sent you a prescription for doxycycline  to your pharmacy.  This is an antibiotic that can help out with cellulitis.  Please follow-up with your primary care doctor next week for wound evaluation.  Follow-up with wound care as scheduled.  Return to the emergency room for worsening pain in your lower leg, fever, chills.

## 2024-08-04 NOTE — ED Triage Notes (Signed)
 Pt reports she is here today due to leg pain. Pt reports this has been an ongoing issues since July and states she is on meds for it but the pain is getting worse.

## 2024-08-04 NOTE — ED Notes (Signed)
 Wound care provided.  Verbal instruction given.

## 2024-08-05 MED ORDER — DOXYCYCLINE HYCLATE 100 MG PO CAPS: 100.0000 mg | ORAL_CAPSULE | Freq: Two times a day (BID) | ORAL | 0 refills | Status: AC

## 2024-08-06 ENCOUNTER — Other Ambulatory Visit (HOSPITAL_COMMUNITY): Payer: Self-pay

## 2024-08-06 ENCOUNTER — Other Ambulatory Visit: Payer: Self-pay

## 2024-08-08 DIAGNOSIS — G43909 Migraine, unspecified, not intractable, without status migrainosus: Secondary | ICD-10-CM | POA: Diagnosis not present

## 2024-08-08 DIAGNOSIS — I872 Venous insufficiency (chronic) (peripheral): Secondary | ICD-10-CM | POA: Diagnosis not present

## 2024-08-08 DIAGNOSIS — L97221 Non-pressure chronic ulcer of left calf limited to breakdown of skin: Secondary | ICD-10-CM | POA: Diagnosis not present

## 2024-08-08 DIAGNOSIS — L03119 Cellulitis of unspecified part of limb: Secondary | ICD-10-CM | POA: Diagnosis not present

## 2024-08-08 DIAGNOSIS — G8929 Other chronic pain: Secondary | ICD-10-CM | POA: Diagnosis not present

## 2024-08-08 DIAGNOSIS — M170 Bilateral primary osteoarthritis of knee: Secondary | ICD-10-CM | POA: Diagnosis not present

## 2024-08-08 DIAGNOSIS — F32A Depression, unspecified: Secondary | ICD-10-CM | POA: Diagnosis not present

## 2024-08-08 DIAGNOSIS — M479 Spondylosis, unspecified: Secondary | ICD-10-CM | POA: Diagnosis not present

## 2024-08-08 DIAGNOSIS — I10 Essential (primary) hypertension: Secondary | ICD-10-CM | POA: Diagnosis not present

## 2024-08-13 ENCOUNTER — Other Ambulatory Visit: Payer: Self-pay

## 2024-08-13 DIAGNOSIS — G4733 Obstructive sleep apnea (adult) (pediatric): Secondary | ICD-10-CM

## 2024-08-14 ENCOUNTER — Telehealth: Payer: Self-pay | Admitting: Family Medicine

## 2024-08-14 DIAGNOSIS — I10 Essential (primary) hypertension: Secondary | ICD-10-CM | POA: Diagnosis not present

## 2024-08-14 DIAGNOSIS — F32A Depression, unspecified: Secondary | ICD-10-CM | POA: Diagnosis not present

## 2024-08-14 DIAGNOSIS — G8929 Other chronic pain: Secondary | ICD-10-CM | POA: Diagnosis not present

## 2024-08-14 DIAGNOSIS — I872 Venous insufficiency (chronic) (peripheral): Secondary | ICD-10-CM | POA: Diagnosis not present

## 2024-08-14 DIAGNOSIS — L03119 Cellulitis of unspecified part of limb: Secondary | ICD-10-CM | POA: Diagnosis not present

## 2024-08-14 DIAGNOSIS — M170 Bilateral primary osteoarthritis of knee: Secondary | ICD-10-CM | POA: Diagnosis not present

## 2024-08-14 DIAGNOSIS — G43909 Migraine, unspecified, not intractable, without status migrainosus: Secondary | ICD-10-CM | POA: Diagnosis not present

## 2024-08-14 DIAGNOSIS — M479 Spondylosis, unspecified: Secondary | ICD-10-CM | POA: Diagnosis not present

## 2024-08-14 DIAGNOSIS — L97221 Non-pressure chronic ulcer of left calf limited to breakdown of skin: Secondary | ICD-10-CM | POA: Diagnosis not present

## 2024-08-14 NOTE — Telephone Encounter (Signed)
 A document form from [facility] has been faxed: Home Health Certificate (Order ID [id number]), to be filled out by provider. Send document back via Fax within 7-days. Document is located in providers tray at front office.          Fax number: [phone]

## 2024-08-15 DIAGNOSIS — G43909 Migraine, unspecified, not intractable, without status migrainosus: Secondary | ICD-10-CM | POA: Diagnosis not present

## 2024-08-15 DIAGNOSIS — L03119 Cellulitis of unspecified part of limb: Secondary | ICD-10-CM | POA: Diagnosis not present

## 2024-08-15 DIAGNOSIS — F32A Depression, unspecified: Secondary | ICD-10-CM | POA: Diagnosis not present

## 2024-08-15 DIAGNOSIS — L97221 Non-pressure chronic ulcer of left calf limited to breakdown of skin: Secondary | ICD-10-CM | POA: Diagnosis not present

## 2024-08-15 DIAGNOSIS — G8929 Other chronic pain: Secondary | ICD-10-CM | POA: Diagnosis not present

## 2024-08-15 DIAGNOSIS — I872 Venous insufficiency (chronic) (peripheral): Secondary | ICD-10-CM | POA: Diagnosis not present

## 2024-08-15 DIAGNOSIS — I10 Essential (primary) hypertension: Secondary | ICD-10-CM | POA: Diagnosis not present

## 2024-08-15 DIAGNOSIS — M170 Bilateral primary osteoarthritis of knee: Secondary | ICD-10-CM | POA: Diagnosis not present

## 2024-08-15 DIAGNOSIS — M479 Spondylosis, unspecified: Secondary | ICD-10-CM | POA: Diagnosis not present

## 2024-08-15 NOTE — Telephone Encounter (Signed)
 Received, placed in provider's folder for signature

## 2024-08-16 ENCOUNTER — Telehealth: Payer: Self-pay | Admitting: Family Medicine

## 2024-08-16 DIAGNOSIS — F32A Depression, unspecified: Secondary | ICD-10-CM | POA: Diagnosis not present

## 2024-08-16 DIAGNOSIS — I10 Essential (primary) hypertension: Secondary | ICD-10-CM | POA: Diagnosis not present

## 2024-08-16 DIAGNOSIS — L97221 Non-pressure chronic ulcer of left calf limited to breakdown of skin: Secondary | ICD-10-CM | POA: Diagnosis not present

## 2024-08-16 DIAGNOSIS — G8929 Other chronic pain: Secondary | ICD-10-CM | POA: Diagnosis not present

## 2024-08-16 DIAGNOSIS — I872 Venous insufficiency (chronic) (peripheral): Secondary | ICD-10-CM | POA: Diagnosis not present

## 2024-08-16 DIAGNOSIS — M170 Bilateral primary osteoarthritis of knee: Secondary | ICD-10-CM | POA: Diagnosis not present

## 2024-08-16 DIAGNOSIS — L03119 Cellulitis of unspecified part of limb: Secondary | ICD-10-CM | POA: Diagnosis not present

## 2024-08-16 DIAGNOSIS — G43909 Migraine, unspecified, not intractable, without status migrainosus: Secondary | ICD-10-CM | POA: Diagnosis not present

## 2024-08-16 DIAGNOSIS — M479 Spondylosis, unspecified: Secondary | ICD-10-CM | POA: Diagnosis not present

## 2024-08-16 NOTE — Telephone Encounter (Signed)
 We received another order from [facility] to be signed. I placed in basket

## 2024-08-16 NOTE — Telephone Encounter (Signed)
 Form faxed back to [facility] with note advising them that per Dr. Tanda she will no longer sign any orders for the patient after the end of August due to pt informing us  she has found a new doctor and no longer wishes to see Dr. Tanda and she cancelled her upcoming appt with Dr. Tanda.

## 2024-08-16 NOTE — Telephone Encounter (Signed)
 We received fax from [facility] for urgent orders for PCP to sign. I placed in Dr Luigi basket up front.

## 2024-08-17 ENCOUNTER — Telehealth: Payer: Self-pay | Admitting: Family Medicine

## 2024-08-17 NOTE — Telephone Encounter (Unsigned)
 Copied from CRM #[id number]. Topic: Clinical - Order For Equipment >> Aug 17, 2024 10:22 AM Amy B wrote: Reason for CRM: Lonell with [facility] states unna boots that were prescribed as contraindicated in wheelchair bound patients.  She recommends two-layer Juxtalite and requires an order for this.  Please call [phone]

## 2024-08-20 ENCOUNTER — Other Ambulatory Visit: Payer: Self-pay | Admitting: Medical Genetics

## 2024-08-20 DIAGNOSIS — Z006 Encounter for examination for normal comparison and control in clinical research program: Secondary | ICD-10-CM

## 2024-08-21 DIAGNOSIS — M170 Bilateral primary osteoarthritis of knee: Secondary | ICD-10-CM | POA: Diagnosis not present

## 2024-08-21 DIAGNOSIS — J984 Other disorders of lung: Secondary | ICD-10-CM | POA: Diagnosis not present

## 2024-08-21 DIAGNOSIS — L97221 Non-pressure chronic ulcer of left calf limited to breakdown of skin: Secondary | ICD-10-CM | POA: Diagnosis not present

## 2024-08-21 DIAGNOSIS — J9611 Chronic respiratory failure with hypoxia: Secondary | ICD-10-CM | POA: Diagnosis not present

## 2024-08-21 DIAGNOSIS — L97229 Non-pressure chronic ulcer of left calf with unspecified severity: Secondary | ICD-10-CM | POA: Diagnosis not present

## 2024-08-21 DIAGNOSIS — R0602 Shortness of breath: Secondary | ICD-10-CM | POA: Diagnosis not present

## 2024-08-21 DIAGNOSIS — G43909 Migraine, unspecified, not intractable, without status migrainosus: Secondary | ICD-10-CM | POA: Diagnosis not present

## 2024-08-21 DIAGNOSIS — I509 Heart failure, unspecified: Secondary | ICD-10-CM | POA: Diagnosis not present

## 2024-08-21 DIAGNOSIS — Z0001 Encounter for general adult medical examination with abnormal findings: Secondary | ICD-10-CM | POA: Diagnosis not present

## 2024-08-21 DIAGNOSIS — L03119 Cellulitis of unspecified part of limb: Secondary | ICD-10-CM | POA: Diagnosis not present

## 2024-08-21 DIAGNOSIS — M545 Low back pain, unspecified: Secondary | ICD-10-CM | POA: Diagnosis not present

## 2024-08-21 DIAGNOSIS — R011 Cardiac murmur, unspecified: Secondary | ICD-10-CM | POA: Diagnosis not present

## 2024-08-21 DIAGNOSIS — I872 Venous insufficiency (chronic) (peripheral): Secondary | ICD-10-CM | POA: Diagnosis not present

## 2024-08-21 DIAGNOSIS — F32A Depression, unspecified: Secondary | ICD-10-CM | POA: Diagnosis not present

## 2024-08-21 DIAGNOSIS — M479 Spondylosis, unspecified: Secondary | ICD-10-CM | POA: Diagnosis not present

## 2024-08-21 DIAGNOSIS — G8929 Other chronic pain: Secondary | ICD-10-CM | POA: Diagnosis not present

## 2024-08-21 DIAGNOSIS — I10 Essential (primary) hypertension: Secondary | ICD-10-CM | POA: Diagnosis not present

## 2024-08-21 DIAGNOSIS — R7303 Prediabetes: Secondary | ICD-10-CM | POA: Diagnosis not present

## 2024-08-22 ENCOUNTER — Telehealth: Payer: Self-pay

## 2024-08-22 DIAGNOSIS — E66813 Obesity, class 3: Secondary | ICD-10-CM

## 2024-08-22 DIAGNOSIS — G4733 Obstructive sleep apnea (adult) (pediatric): Secondary | ICD-10-CM

## 2024-08-22 DIAGNOSIS — Z6841 Body Mass Index (BMI) 40.0 and over, adult: Secondary | ICD-10-CM

## 2024-08-22 NOTE — Telephone Encounter (Signed)
 Copied from CRM [id number]. Topic: Clinical - Prescription Issue >> Aug 21, 2024  4:37 PM Devaughn RAMAN wrote: Reason for CRM: Patient called and stated her primary care doctor advised her to follow up with Dr.Dewlad regarding her medication for tirzepatide  (ZEPBOUND ) 2.5 MG/0.5ML Pen. Patient stated she has taken all 4 dosages and has only lost 4lbs and was advised from her PCP to inform Dr.Dewald she needs to up her dosage on the medication, patient stated she has taken all the shots for the tirzepatide  (ZEPBOUND ) 2.5 MG/0.5ML Pen and she needs a refill at the higher dosage.    Noted   -NFN

## 2024-08-22 NOTE — Telephone Encounter (Unsigned)
 Copied from CRM [id number]. Topic: Clinical - Prescription Issue >> Aug 21, 2024  4:37 PM Devaughn RAMAN wrote: Reason for CRM: Patient called and stated her primary care doctor advised her to follow up with Dr.Dewlad regarding her medication for tirzepatide  (ZEPBOUND ) 2.5 MG/0.5ML Pen. Patient stated she has taken all 4 dosages and has only lost 4lbs and was advised from her PCP to inform Dr.Dewald she needs to up her dosage on the medication, patient stated she has taken all the shots for the tirzepatide  (ZEPBOUND ) 2.5 MG/0.5ML Pen and she needs a refill at the higher dosage.    Spoke w/ Patient VBU - NFN

## 2024-08-22 NOTE — Telephone Encounter (Signed)
 Copied from CRM [id number]. Topic: Clinical - Order For Equipment >> Aug 22, 2024 11:58 AM Dedra B wrote: Reason for CRM: Lonell from [facility] called and said that she's still waiting to hear back regarding pt previous order for unna boots. The order is contraindicating since the pt is wheelchair bound. Contraindicated. Pls call Lonell at [phone].

## 2024-08-23 ENCOUNTER — Telehealth: Payer: Self-pay | Admitting: Pulmonary Disease

## 2024-08-23 ENCOUNTER — Telehealth: Payer: Self-pay

## 2024-08-23 ENCOUNTER — Other Ambulatory Visit (HOSPITAL_COMMUNITY): Payer: Self-pay

## 2024-08-23 ENCOUNTER — Other Ambulatory Visit: Payer: Self-pay

## 2024-08-23 DIAGNOSIS — G4733 Obstructive sleep apnea (adult) (pediatric): Secondary | ICD-10-CM

## 2024-08-23 MED ORDER — ZEPBOUND 5 MG/0.5ML ~~LOC~~ SOAJ
5.0000 mg | SUBCUTANEOUS | 0 refills | Status: DC
  Filled 2024-08-23: qty 2, 28d supply, fill #0

## 2024-08-23 NOTE — Telephone Encounter (Signed)
 PT states she had a fitting this moring she think the pressure  is to high mask blows off face

## 2024-08-23 NOTE — Telephone Encounter (Signed)
 I called the pharmacy and spoke to [place]. Redell informed of Dr Luann okay to switch. Redell verbalized understanding. NFN

## 2024-08-23 NOTE — Telephone Encounter (Signed)
 Dr Kara,  Please advise if it is okay for the pharmacy to switch to the 5MG  pen for Zepbound .

## 2024-08-23 NOTE — Telephone Encounter (Signed)
 Updated prescription sent to pharmacy. Patient is to take 5mg  weekly. She can let us  know at the end of each month if she is tolerating the medication and we will continue to increase her dose by 2.5mg  each month, until we reach 10-15mg  per week.  Thanks, JD

## 2024-08-23 NOTE — Telephone Encounter (Signed)
 Pharm calling for Zepbound  RX. They need an OK to switch it to the pen 5mg .  CB # [phone]

## 2024-08-24 ENCOUNTER — Other Ambulatory Visit (HOSPITAL_COMMUNITY): Payer: Self-pay

## 2024-08-24 ENCOUNTER — Other Ambulatory Visit: Payer: Self-pay

## 2024-08-24 ENCOUNTER — Telehealth: Payer: Self-pay

## 2024-08-24 NOTE — Telephone Encounter (Addendum)
 PT has been scheduled sooner for pap setting issues

## 2024-08-24 NOTE — Telephone Encounter (Signed)
 Also she needs to be scheduled for f/u do you want to see her earlier or keep it at 4 months ??

## 2024-08-28 ENCOUNTER — Ambulatory Visit (HOSPITAL_BASED_OUTPATIENT_CLINIC_OR_DEPARTMENT_OTHER): Admitting: General Surgery

## 2024-08-30 DIAGNOSIS — I10 Essential (primary) hypertension: Secondary | ICD-10-CM | POA: Diagnosis not present

## 2024-08-30 DIAGNOSIS — G43909 Migraine, unspecified, not intractable, without status migrainosus: Secondary | ICD-10-CM | POA: Diagnosis not present

## 2024-08-30 DIAGNOSIS — M479 Spondylosis, unspecified: Secondary | ICD-10-CM | POA: Diagnosis not present

## 2024-08-30 DIAGNOSIS — F32A Depression, unspecified: Secondary | ICD-10-CM | POA: Diagnosis not present

## 2024-08-30 DIAGNOSIS — G8929 Other chronic pain: Secondary | ICD-10-CM | POA: Diagnosis not present

## 2024-08-30 DIAGNOSIS — L97221 Non-pressure chronic ulcer of left calf limited to breakdown of skin: Secondary | ICD-10-CM | POA: Diagnosis not present

## 2024-08-30 DIAGNOSIS — L03119 Cellulitis of unspecified part of limb: Secondary | ICD-10-CM | POA: Diagnosis not present

## 2024-08-30 DIAGNOSIS — M170 Bilateral primary osteoarthritis of knee: Secondary | ICD-10-CM | POA: Diagnosis not present

## 2024-08-30 DIAGNOSIS — I872 Venous insufficiency (chronic) (peripheral): Secondary | ICD-10-CM | POA: Diagnosis not present

## 2024-09-03 ENCOUNTER — Other Ambulatory Visit: Payer: Self-pay

## 2024-09-03 ENCOUNTER — Other Ambulatory Visit (HOSPITAL_COMMUNITY): Payer: Self-pay

## 2024-09-03 MED ORDER — CYCLOBENZAPRINE HCL 10 MG PO TABS
10.0000 mg | ORAL_TABLET | Freq: Three times a day (TID) | ORAL | 3 refills | Status: DC | PRN
  Filled 2024-09-03: qty 60, 20d supply, fill #0
  Filled 2024-10-03: qty 60, 20d supply, fill #1
  Filled 2024-10-29: qty 60, 20d supply, fill #2
  Filled 2024-11-23: qty 60, 20d supply, fill #3

## 2024-09-03 MED ORDER — DICLOFENAC SODIUM 75 MG PO TBEC
75.0000 mg | DELAYED_RELEASE_TABLET | Freq: Two times a day (BID) | ORAL | 4 refills | Status: AC
  Filled 2024-09-03: qty 60, 30d supply, fill #0
  Filled 2024-10-03: qty 60, 30d supply, fill #1
  Filled 2024-10-29: qty 60, 30d supply, fill #2
  Filled 2024-11-23: qty 60, 30d supply, fill #3
  Filled 2024-12-06 – 2024-12-17 (×3): qty 60, 30d supply, fill #4

## 2024-09-04 DIAGNOSIS — I872 Venous insufficiency (chronic) (peripheral): Secondary | ICD-10-CM | POA: Diagnosis not present

## 2024-09-04 DIAGNOSIS — M170 Bilateral primary osteoarthritis of knee: Secondary | ICD-10-CM | POA: Diagnosis not present

## 2024-09-04 DIAGNOSIS — I10 Essential (primary) hypertension: Secondary | ICD-10-CM | POA: Diagnosis not present

## 2024-09-04 DIAGNOSIS — L03119 Cellulitis of unspecified part of limb: Secondary | ICD-10-CM | POA: Diagnosis not present

## 2024-09-04 DIAGNOSIS — G8929 Other chronic pain: Secondary | ICD-10-CM | POA: Diagnosis not present

## 2024-09-04 DIAGNOSIS — F32A Depression, unspecified: Secondary | ICD-10-CM | POA: Diagnosis not present

## 2024-09-04 DIAGNOSIS — M479 Spondylosis, unspecified: Secondary | ICD-10-CM | POA: Diagnosis not present

## 2024-09-04 DIAGNOSIS — G43909 Migraine, unspecified, not intractable, without status migrainosus: Secondary | ICD-10-CM | POA: Diagnosis not present

## 2024-09-04 DIAGNOSIS — L97221 Non-pressure chronic ulcer of left calf limited to breakdown of skin: Secondary | ICD-10-CM | POA: Diagnosis not present

## 2024-09-06 DIAGNOSIS — L03119 Cellulitis of unspecified part of limb: Secondary | ICD-10-CM | POA: Diagnosis not present

## 2024-09-06 DIAGNOSIS — G43909 Migraine, unspecified, not intractable, without status migrainosus: Secondary | ICD-10-CM | POA: Diagnosis not present

## 2024-09-06 DIAGNOSIS — F32A Depression, unspecified: Secondary | ICD-10-CM | POA: Diagnosis not present

## 2024-09-06 DIAGNOSIS — G8929 Other chronic pain: Secondary | ICD-10-CM | POA: Diagnosis not present

## 2024-09-06 DIAGNOSIS — L97221 Non-pressure chronic ulcer of left calf limited to breakdown of skin: Secondary | ICD-10-CM | POA: Diagnosis not present

## 2024-09-06 DIAGNOSIS — M170 Bilateral primary osteoarthritis of knee: Secondary | ICD-10-CM | POA: Diagnosis not present

## 2024-09-06 DIAGNOSIS — I10 Essential (primary) hypertension: Secondary | ICD-10-CM | POA: Diagnosis not present

## 2024-09-06 DIAGNOSIS — I872 Venous insufficiency (chronic) (peripheral): Secondary | ICD-10-CM | POA: Diagnosis not present

## 2024-09-06 DIAGNOSIS — M479 Spondylosis, unspecified: Secondary | ICD-10-CM | POA: Diagnosis not present

## 2024-09-10 DIAGNOSIS — I872 Venous insufficiency (chronic) (peripheral): Secondary | ICD-10-CM | POA: Diagnosis not present

## 2024-09-10 DIAGNOSIS — L03119 Cellulitis of unspecified part of limb: Secondary | ICD-10-CM | POA: Diagnosis not present

## 2024-09-10 DIAGNOSIS — G43909 Migraine, unspecified, not intractable, without status migrainosus: Secondary | ICD-10-CM | POA: Diagnosis not present

## 2024-09-10 DIAGNOSIS — M170 Bilateral primary osteoarthritis of knee: Secondary | ICD-10-CM | POA: Diagnosis not present

## 2024-09-10 DIAGNOSIS — M479 Spondylosis, unspecified: Secondary | ICD-10-CM | POA: Diagnosis not present

## 2024-09-10 DIAGNOSIS — F32A Depression, unspecified: Secondary | ICD-10-CM | POA: Diagnosis not present

## 2024-09-10 DIAGNOSIS — G8929 Other chronic pain: Secondary | ICD-10-CM | POA: Diagnosis not present

## 2024-09-10 DIAGNOSIS — I10 Essential (primary) hypertension: Secondary | ICD-10-CM | POA: Diagnosis not present

## 2024-09-10 DIAGNOSIS — L97221 Non-pressure chronic ulcer of left calf limited to breakdown of skin: Secondary | ICD-10-CM | POA: Diagnosis not present

## 2024-09-11 ENCOUNTER — Other Ambulatory Visit: Payer: Self-pay | Admitting: Physician Assistant

## 2024-09-11 DIAGNOSIS — I10 Essential (primary) hypertension: Secondary | ICD-10-CM

## 2024-09-12 ENCOUNTER — Other Ambulatory Visit (HOSPITAL_COMMUNITY): Payer: Self-pay

## 2024-09-12 ENCOUNTER — Other Ambulatory Visit: Payer: Self-pay

## 2024-09-13 DIAGNOSIS — I872 Venous insufficiency (chronic) (peripheral): Secondary | ICD-10-CM | POA: Diagnosis not present

## 2024-09-13 DIAGNOSIS — L03119 Cellulitis of unspecified part of limb: Secondary | ICD-10-CM | POA: Diagnosis not present

## 2024-09-13 DIAGNOSIS — F32A Depression, unspecified: Secondary | ICD-10-CM | POA: Diagnosis not present

## 2024-09-13 DIAGNOSIS — L97221 Non-pressure chronic ulcer of left calf limited to breakdown of skin: Secondary | ICD-10-CM | POA: Diagnosis not present

## 2024-09-13 DIAGNOSIS — M479 Spondylosis, unspecified: Secondary | ICD-10-CM | POA: Diagnosis not present

## 2024-09-13 DIAGNOSIS — G8929 Other chronic pain: Secondary | ICD-10-CM | POA: Diagnosis not present

## 2024-09-13 DIAGNOSIS — G43909 Migraine, unspecified, not intractable, without status migrainosus: Secondary | ICD-10-CM | POA: Diagnosis not present

## 2024-09-13 DIAGNOSIS — I10 Essential (primary) hypertension: Secondary | ICD-10-CM | POA: Diagnosis not present

## 2024-09-13 DIAGNOSIS — M170 Bilateral primary osteoarthritis of knee: Secondary | ICD-10-CM | POA: Diagnosis not present

## 2024-09-14 ENCOUNTER — Other Ambulatory Visit (HOSPITAL_COMMUNITY): Payer: Self-pay

## 2024-09-15 DIAGNOSIS — M479 Spondylosis, unspecified: Secondary | ICD-10-CM | POA: Diagnosis not present

## 2024-09-15 DIAGNOSIS — I10 Essential (primary) hypertension: Secondary | ICD-10-CM | POA: Diagnosis not present

## 2024-09-15 DIAGNOSIS — L03119 Cellulitis of unspecified part of limb: Secondary | ICD-10-CM | POA: Diagnosis not present

## 2024-09-15 DIAGNOSIS — L97221 Non-pressure chronic ulcer of left calf limited to breakdown of skin: Secondary | ICD-10-CM | POA: Diagnosis not present

## 2024-09-15 DIAGNOSIS — G43909 Migraine, unspecified, not intractable, without status migrainosus: Secondary | ICD-10-CM | POA: Diagnosis not present

## 2024-09-15 DIAGNOSIS — I872 Venous insufficiency (chronic) (peripheral): Secondary | ICD-10-CM | POA: Diagnosis not present

## 2024-09-15 DIAGNOSIS — M170 Bilateral primary osteoarthritis of knee: Secondary | ICD-10-CM | POA: Diagnosis not present

## 2024-09-15 DIAGNOSIS — F32A Depression, unspecified: Secondary | ICD-10-CM | POA: Diagnosis not present

## 2024-09-15 DIAGNOSIS — G8929 Other chronic pain: Secondary | ICD-10-CM | POA: Diagnosis not present

## 2024-09-17 DIAGNOSIS — I10 Essential (primary) hypertension: Secondary | ICD-10-CM | POA: Diagnosis not present

## 2024-09-17 DIAGNOSIS — M170 Bilateral primary osteoarthritis of knee: Secondary | ICD-10-CM | POA: Diagnosis not present

## 2024-09-17 DIAGNOSIS — I872 Venous insufficiency (chronic) (peripheral): Secondary | ICD-10-CM | POA: Diagnosis not present

## 2024-09-17 DIAGNOSIS — G8929 Other chronic pain: Secondary | ICD-10-CM | POA: Diagnosis not present

## 2024-09-17 DIAGNOSIS — F32A Depression, unspecified: Secondary | ICD-10-CM | POA: Diagnosis not present

## 2024-09-17 DIAGNOSIS — L03119 Cellulitis of unspecified part of limb: Secondary | ICD-10-CM | POA: Diagnosis not present

## 2024-09-17 DIAGNOSIS — L97221 Non-pressure chronic ulcer of left calf limited to breakdown of skin: Secondary | ICD-10-CM | POA: Diagnosis not present

## 2024-09-17 DIAGNOSIS — G43909 Migraine, unspecified, not intractable, without status migrainosus: Secondary | ICD-10-CM | POA: Diagnosis not present

## 2024-09-17 DIAGNOSIS — M479 Spondylosis, unspecified: Secondary | ICD-10-CM | POA: Diagnosis not present

## 2024-09-18 ENCOUNTER — Other Ambulatory Visit (HOSPITAL_COMMUNITY): Payer: Self-pay

## 2024-09-18 DIAGNOSIS — G8929 Other chronic pain: Secondary | ICD-10-CM | POA: Diagnosis not present

## 2024-09-18 DIAGNOSIS — M479 Spondylosis, unspecified: Secondary | ICD-10-CM | POA: Diagnosis not present

## 2024-09-18 DIAGNOSIS — I10 Essential (primary) hypertension: Secondary | ICD-10-CM | POA: Diagnosis not present

## 2024-09-18 DIAGNOSIS — I872 Venous insufficiency (chronic) (peripheral): Secondary | ICD-10-CM | POA: Diagnosis not present

## 2024-09-18 DIAGNOSIS — M170 Bilateral primary osteoarthritis of knee: Secondary | ICD-10-CM | POA: Diagnosis not present

## 2024-09-18 DIAGNOSIS — F32A Depression, unspecified: Secondary | ICD-10-CM | POA: Diagnosis not present

## 2024-09-18 DIAGNOSIS — L03119 Cellulitis of unspecified part of limb: Secondary | ICD-10-CM | POA: Diagnosis not present

## 2024-09-18 DIAGNOSIS — G43909 Migraine, unspecified, not intractable, without status migrainosus: Secondary | ICD-10-CM | POA: Diagnosis not present

## 2024-09-18 LAB — GENECONNECT MOLECULAR SCREEN: Genetic Analysis Overall Interpretation: NEGATIVE

## 2024-09-20 ENCOUNTER — Other Ambulatory Visit (HOSPITAL_COMMUNITY): Payer: Self-pay

## 2024-09-20 ENCOUNTER — Other Ambulatory Visit: Payer: Self-pay | Admitting: Physician Assistant

## 2024-09-20 DIAGNOSIS — I10 Essential (primary) hypertension: Secondary | ICD-10-CM

## 2024-09-22 ENCOUNTER — Other Ambulatory Visit: Payer: Self-pay | Admitting: Pulmonary Disease

## 2024-09-22 DIAGNOSIS — Z6841 Body Mass Index (BMI) 40.0 and over, adult: Secondary | ICD-10-CM

## 2024-09-22 DIAGNOSIS — G4733 Obstructive sleep apnea (adult) (pediatric): Secondary | ICD-10-CM

## 2024-09-22 DIAGNOSIS — E66813 Obesity, class 3: Secondary | ICD-10-CM

## 2024-09-24 ENCOUNTER — Other Ambulatory Visit: Payer: Self-pay

## 2024-09-24 ENCOUNTER — Encounter (HOSPITAL_BASED_OUTPATIENT_CLINIC_OR_DEPARTMENT_OTHER): Attending: General Surgery | Admitting: General Surgery

## 2024-09-24 ENCOUNTER — Other Ambulatory Visit (HOSPITAL_COMMUNITY): Payer: Self-pay

## 2024-09-24 DIAGNOSIS — Z87891 Personal history of nicotine dependence: Secondary | ICD-10-CM | POA: Insufficient documentation

## 2024-09-24 DIAGNOSIS — E662 Morbid (severe) obesity with alveolar hypoventilation: Secondary | ICD-10-CM | POA: Diagnosis not present

## 2024-09-24 DIAGNOSIS — Z6841 Body Mass Index (BMI) 40.0 and over, adult: Secondary | ICD-10-CM | POA: Insufficient documentation

## 2024-09-24 DIAGNOSIS — I890 Lymphedema, not elsewhere classified: Secondary | ICD-10-CM | POA: Insufficient documentation

## 2024-09-24 DIAGNOSIS — I872 Venous insufficiency (chronic) (peripheral): Secondary | ICD-10-CM | POA: Insufficient documentation

## 2024-09-24 DIAGNOSIS — L97829 Non-pressure chronic ulcer of other part of left lower leg with unspecified severity: Secondary | ICD-10-CM | POA: Insufficient documentation

## 2024-09-24 DIAGNOSIS — J9611 Chronic respiratory failure with hypoxia: Secondary | ICD-10-CM | POA: Diagnosis not present

## 2024-09-24 DIAGNOSIS — Z9981 Dependence on supplemental oxygen: Secondary | ICD-10-CM | POA: Diagnosis not present

## 2024-09-24 MED ORDER — ZEPBOUND 5 MG/0.5ML ~~LOC~~ SOAJ
7.5000 mg | SUBCUTANEOUS | 0 refills | Status: DC
  Filled 2024-09-24: qty 3, 28d supply, fill #0
  Filled 2024-09-24: qty 4, 28d supply, fill #0

## 2024-09-25 ENCOUNTER — Ambulatory Visit: Admitting: Pulmonary Disease

## 2024-09-25 ENCOUNTER — Other Ambulatory Visit (HOSPITAL_COMMUNITY): Payer: Self-pay

## 2024-10-01 ENCOUNTER — Encounter (HOSPITAL_BASED_OUTPATIENT_CLINIC_OR_DEPARTMENT_OTHER): Attending: General Surgery | Admitting: General Surgery

## 2024-10-01 DIAGNOSIS — J9611 Chronic respiratory failure with hypoxia: Secondary | ICD-10-CM | POA: Diagnosis not present

## 2024-10-01 DIAGNOSIS — Z6841 Body Mass Index (BMI) 40.0 and over, adult: Secondary | ICD-10-CM | POA: Diagnosis not present

## 2024-10-01 DIAGNOSIS — I872 Venous insufficiency (chronic) (peripheral): Secondary | ICD-10-CM | POA: Insufficient documentation

## 2024-10-01 DIAGNOSIS — Z87891 Personal history of nicotine dependence: Secondary | ICD-10-CM | POA: Diagnosis not present

## 2024-10-01 DIAGNOSIS — I890 Lymphedema, not elsewhere classified: Secondary | ICD-10-CM | POA: Insufficient documentation

## 2024-10-01 DIAGNOSIS — E662 Morbid (severe) obesity with alveolar hypoventilation: Secondary | ICD-10-CM | POA: Diagnosis not present

## 2024-10-01 DIAGNOSIS — L97829 Non-pressure chronic ulcer of other part of left lower leg with unspecified severity: Secondary | ICD-10-CM | POA: Insufficient documentation

## 2024-10-01 DIAGNOSIS — Z9981 Dependence on supplemental oxygen: Secondary | ICD-10-CM | POA: Insufficient documentation

## 2024-10-02 ENCOUNTER — Other Ambulatory Visit (HOSPITAL_COMMUNITY): Payer: Self-pay

## 2024-10-02 DIAGNOSIS — G8929 Other chronic pain: Secondary | ICD-10-CM | POA: Diagnosis not present

## 2024-10-02 DIAGNOSIS — L97221 Non-pressure chronic ulcer of left calf limited to breakdown of skin: Secondary | ICD-10-CM | POA: Diagnosis not present

## 2024-10-02 DIAGNOSIS — I872 Venous insufficiency (chronic) (peripheral): Secondary | ICD-10-CM | POA: Diagnosis not present

## 2024-10-02 DIAGNOSIS — M170 Bilateral primary osteoarthritis of knee: Secondary | ICD-10-CM | POA: Diagnosis not present

## 2024-10-02 DIAGNOSIS — G43909 Migraine, unspecified, not intractable, without status migrainosus: Secondary | ICD-10-CM | POA: Diagnosis not present

## 2024-10-02 DIAGNOSIS — F32A Depression, unspecified: Secondary | ICD-10-CM | POA: Diagnosis not present

## 2024-10-02 DIAGNOSIS — L03119 Cellulitis of unspecified part of limb: Secondary | ICD-10-CM | POA: Diagnosis not present

## 2024-10-02 DIAGNOSIS — I10 Essential (primary) hypertension: Secondary | ICD-10-CM | POA: Diagnosis not present

## 2024-10-02 DIAGNOSIS — M479 Spondylosis, unspecified: Secondary | ICD-10-CM | POA: Diagnosis not present

## 2024-10-03 ENCOUNTER — Other Ambulatory Visit (HOSPITAL_COMMUNITY): Payer: Self-pay

## 2024-10-03 ENCOUNTER — Other Ambulatory Visit: Payer: Self-pay

## 2024-10-03 ENCOUNTER — Other Ambulatory Visit: Payer: Self-pay | Admitting: Pulmonary Disease

## 2024-10-03 DIAGNOSIS — Z6841 Body Mass Index (BMI) 40.0 and over, adult: Secondary | ICD-10-CM

## 2024-10-03 DIAGNOSIS — G4733 Obstructive sleep apnea (adult) (pediatric): Secondary | ICD-10-CM

## 2024-10-03 MED ORDER — ZEPBOUND 7.5 MG/0.5ML ~~LOC~~ SOAJ
7.5000 mg | SUBCUTANEOUS | 0 refills | Status: DC
Start: 2024-10-03 — End: 2024-11-21
  Filled 2024-10-03: qty 2, 28d supply, fill #0

## 2024-10-03 NOTE — Addendum Note (Signed)
 Addended by: Ithiel Liebler on: 10/03/2024 12:22 PM   Modules accepted: Orders

## 2024-10-03 NOTE — Telephone Encounter (Signed)
 Updated prescription sent to pharmacy.

## 2024-10-04 ENCOUNTER — Other Ambulatory Visit: Payer: Self-pay

## 2024-10-04 ENCOUNTER — Telehealth: Payer: Self-pay

## 2024-10-04 DIAGNOSIS — G4733 Obstructive sleep apnea (adult) (pediatric): Secondary | ICD-10-CM

## 2024-10-04 NOTE — Telephone Encounter (Signed)
 Copied from CRM [id number]. Topic: General - Other >> Sep 28, 2024  2:01 PM Rilla B wrote: Reason for CRM: BiPAP machine is blowing too hard.  Has appt in November, however, can Dr Kara change the settings.  Cannot use the machine and is very restless.  Please call patient at [phone].  Dr Kara, please advise.

## 2024-10-04 NOTE — Telephone Encounter (Signed)
 Please send in order to change bipap setting to 22/18 from 24/20. Hopefully this helps her tolerate bipap better.  Thanks, JD

## 2024-10-04 NOTE — Telephone Encounter (Signed)
 Order placed for change of settings

## 2024-10-08 ENCOUNTER — Encounter (HOSPITAL_BASED_OUTPATIENT_CLINIC_OR_DEPARTMENT_OTHER): Admitting: General Surgery

## 2024-10-08 ENCOUNTER — Other Ambulatory Visit: Payer: Self-pay | Admitting: Physician Assistant

## 2024-10-08 ENCOUNTER — Other Ambulatory Visit: Payer: Self-pay

## 2024-10-08 ENCOUNTER — Other Ambulatory Visit (HOSPITAL_COMMUNITY): Payer: Self-pay

## 2024-10-08 DIAGNOSIS — K863 Pseudocyst of pancreas: Secondary | ICD-10-CM

## 2024-10-10 ENCOUNTER — Other Ambulatory Visit (HOSPITAL_COMMUNITY): Payer: Self-pay

## 2024-10-10 ENCOUNTER — Other Ambulatory Visit: Payer: Self-pay | Admitting: Family Medicine

## 2024-10-10 DIAGNOSIS — G8929 Other chronic pain: Secondary | ICD-10-CM | POA: Diagnosis not present

## 2024-10-10 DIAGNOSIS — K863 Pseudocyst of pancreas: Secondary | ICD-10-CM

## 2024-10-10 DIAGNOSIS — L97221 Non-pressure chronic ulcer of left calf limited to breakdown of skin: Secondary | ICD-10-CM | POA: Diagnosis not present

## 2024-10-10 DIAGNOSIS — M479 Spondylosis, unspecified: Secondary | ICD-10-CM | POA: Diagnosis not present

## 2024-10-10 DIAGNOSIS — L03119 Cellulitis of unspecified part of limb: Secondary | ICD-10-CM | POA: Diagnosis not present

## 2024-10-10 DIAGNOSIS — I10 Essential (primary) hypertension: Secondary | ICD-10-CM | POA: Diagnosis not present

## 2024-10-10 DIAGNOSIS — G43909 Migraine, unspecified, not intractable, without status migrainosus: Secondary | ICD-10-CM | POA: Diagnosis not present

## 2024-10-10 DIAGNOSIS — F32A Depression, unspecified: Secondary | ICD-10-CM | POA: Diagnosis not present

## 2024-10-10 DIAGNOSIS — I872 Venous insufficiency (chronic) (peripheral): Secondary | ICD-10-CM | POA: Diagnosis not present

## 2024-10-10 DIAGNOSIS — M170 Bilateral primary osteoarthritis of knee: Secondary | ICD-10-CM | POA: Diagnosis not present

## 2024-10-11 ENCOUNTER — Other Ambulatory Visit (HOSPITAL_COMMUNITY): Payer: Self-pay

## 2024-10-12 ENCOUNTER — Other Ambulatory Visit (HOSPITAL_COMMUNITY): Payer: Self-pay

## 2024-10-12 DIAGNOSIS — L03119 Cellulitis of unspecified part of limb: Secondary | ICD-10-CM | POA: Diagnosis not present

## 2024-10-12 DIAGNOSIS — M170 Bilateral primary osteoarthritis of knee: Secondary | ICD-10-CM | POA: Diagnosis not present

## 2024-10-12 DIAGNOSIS — G43909 Migraine, unspecified, not intractable, without status migrainosus: Secondary | ICD-10-CM | POA: Diagnosis not present

## 2024-10-12 DIAGNOSIS — L97221 Non-pressure chronic ulcer of left calf limited to breakdown of skin: Secondary | ICD-10-CM | POA: Diagnosis not present

## 2024-10-12 DIAGNOSIS — G8929 Other chronic pain: Secondary | ICD-10-CM | POA: Diagnosis not present

## 2024-10-12 DIAGNOSIS — I872 Venous insufficiency (chronic) (peripheral): Secondary | ICD-10-CM | POA: Diagnosis not present

## 2024-10-12 DIAGNOSIS — F32A Depression, unspecified: Secondary | ICD-10-CM | POA: Diagnosis not present

## 2024-10-12 DIAGNOSIS — M479 Spondylosis, unspecified: Secondary | ICD-10-CM | POA: Diagnosis not present

## 2024-10-12 DIAGNOSIS — I10 Essential (primary) hypertension: Secondary | ICD-10-CM | POA: Diagnosis not present

## 2024-10-13 ENCOUNTER — Other Ambulatory Visit (HOSPITAL_COMMUNITY): Payer: Self-pay

## 2024-10-14 ENCOUNTER — Other Ambulatory Visit (HOSPITAL_COMMUNITY): Payer: Self-pay

## 2024-10-15 ENCOUNTER — Other Ambulatory Visit: Payer: Self-pay

## 2024-10-15 ENCOUNTER — Other Ambulatory Visit (HOSPITAL_COMMUNITY): Payer: Self-pay

## 2024-10-15 DIAGNOSIS — F32A Depression, unspecified: Secondary | ICD-10-CM | POA: Diagnosis not present

## 2024-10-15 DIAGNOSIS — G8929 Other chronic pain: Secondary | ICD-10-CM | POA: Diagnosis not present

## 2024-10-15 DIAGNOSIS — G43909 Migraine, unspecified, not intractable, without status migrainosus: Secondary | ICD-10-CM | POA: Diagnosis not present

## 2024-10-15 DIAGNOSIS — I872 Venous insufficiency (chronic) (peripheral): Secondary | ICD-10-CM | POA: Diagnosis not present

## 2024-10-15 DIAGNOSIS — L03119 Cellulitis of unspecified part of limb: Secondary | ICD-10-CM | POA: Diagnosis not present

## 2024-10-15 DIAGNOSIS — L97221 Non-pressure chronic ulcer of left calf limited to breakdown of skin: Secondary | ICD-10-CM | POA: Diagnosis not present

## 2024-10-15 DIAGNOSIS — I10 Essential (primary) hypertension: Secondary | ICD-10-CM | POA: Diagnosis not present

## 2024-10-15 DIAGNOSIS — M170 Bilateral primary osteoarthritis of knee: Secondary | ICD-10-CM | POA: Diagnosis not present

## 2024-10-15 DIAGNOSIS — M479 Spondylosis, unspecified: Secondary | ICD-10-CM | POA: Diagnosis not present

## 2024-10-15 MED ORDER — METOPROLOL SUCCINATE ER 25 MG PO TB24
25.0000 mg | ORAL_TABLET | Freq: Every day | ORAL | 3 refills | Status: AC
  Filled 2024-10-15 (×2): qty 30, 30d supply, fill #0
  Filled 2024-11-15: qty 30, 30d supply, fill #1
  Filled 2024-12-06 – 2024-12-14 (×2): qty 30, 30d supply, fill #2
  Filled 2025-01-17: qty 30, 30d supply, fill #3

## 2024-10-15 MED ORDER — LISINOPRIL 20 MG PO TABS
20.0000 mg | ORAL_TABLET | Freq: Every day | ORAL | 3 refills | Status: DC
  Filled 2024-10-15 – 2024-11-05 (×3): qty 90, 90d supply, fill #0

## 2024-10-16 ENCOUNTER — Other Ambulatory Visit (HOSPITAL_COMMUNITY): Payer: Self-pay

## 2024-10-16 ENCOUNTER — Other Ambulatory Visit: Payer: Self-pay

## 2024-10-16 MED ORDER — PANCRELIPASE (LIP-PROT-AMYL) 36000-114000 UNITS PO CPEP
36000.0000 [IU] | ORAL_CAPSULE | Freq: Four times a day (QID) | ORAL | 3 refills | Status: AC | PRN
  Filled 2024-10-16: qty 300, 75d supply, fill #0
  Filled 2024-12-06 – 2024-12-18 (×3): qty 300, 75d supply, fill #1

## 2024-10-16 MED ORDER — LISINOPRIL 20 MG PO TABS
20.0000 mg | ORAL_TABLET | Freq: Every day | ORAL | 3 refills | Status: AC
  Filled 2024-10-16 – 2025-01-17 (×7): qty 90, 90d supply, fill #0

## 2024-10-16 MED ORDER — METOPROLOL SUCCINATE ER 25 MG PO TB24: 25.0000 mg | ORAL_TABLET | Freq: Every day | ORAL | 3 refills | Status: DC

## 2024-10-17 ENCOUNTER — Other Ambulatory Visit: Payer: Self-pay

## 2024-10-17 ENCOUNTER — Other Ambulatory Visit (HOSPITAL_COMMUNITY): Payer: Self-pay

## 2024-10-19 DIAGNOSIS — L03119 Cellulitis of unspecified part of limb: Secondary | ICD-10-CM | POA: Diagnosis not present

## 2024-10-19 DIAGNOSIS — G43909 Migraine, unspecified, not intractable, without status migrainosus: Secondary | ICD-10-CM | POA: Diagnosis not present

## 2024-10-19 DIAGNOSIS — M479 Spondylosis, unspecified: Secondary | ICD-10-CM | POA: Diagnosis not present

## 2024-10-19 DIAGNOSIS — F32A Depression, unspecified: Secondary | ICD-10-CM | POA: Diagnosis not present

## 2024-10-19 DIAGNOSIS — G8929 Other chronic pain: Secondary | ICD-10-CM | POA: Diagnosis not present

## 2024-10-19 DIAGNOSIS — M170 Bilateral primary osteoarthritis of knee: Secondary | ICD-10-CM | POA: Diagnosis not present

## 2024-10-19 DIAGNOSIS — I10 Essential (primary) hypertension: Secondary | ICD-10-CM | POA: Diagnosis not present

## 2024-10-19 DIAGNOSIS — L97221 Non-pressure chronic ulcer of left calf limited to breakdown of skin: Secondary | ICD-10-CM | POA: Diagnosis not present

## 2024-10-19 DIAGNOSIS — I872 Venous insufficiency (chronic) (peripheral): Secondary | ICD-10-CM | POA: Diagnosis not present

## 2024-10-23 ENCOUNTER — Other Ambulatory Visit (HOSPITAL_COMMUNITY): Payer: Self-pay

## 2024-10-23 ENCOUNTER — Other Ambulatory Visit: Payer: Self-pay

## 2024-10-23 ENCOUNTER — Other Ambulatory Visit: Payer: Self-pay | Admitting: Physician Assistant

## 2024-10-23 DIAGNOSIS — M170 Bilateral primary osteoarthritis of knee: Secondary | ICD-10-CM | POA: Diagnosis not present

## 2024-10-23 DIAGNOSIS — L97221 Non-pressure chronic ulcer of left calf limited to breakdown of skin: Secondary | ICD-10-CM | POA: Diagnosis not present

## 2024-10-23 DIAGNOSIS — G43909 Migraine, unspecified, not intractable, without status migrainosus: Secondary | ICD-10-CM | POA: Diagnosis not present

## 2024-10-23 DIAGNOSIS — N3281 Overactive bladder: Secondary | ICD-10-CM

## 2024-10-23 DIAGNOSIS — G8929 Other chronic pain: Secondary | ICD-10-CM | POA: Diagnosis not present

## 2024-10-23 DIAGNOSIS — M479 Spondylosis, unspecified: Secondary | ICD-10-CM | POA: Diagnosis not present

## 2024-10-23 DIAGNOSIS — L03119 Cellulitis of unspecified part of limb: Secondary | ICD-10-CM | POA: Diagnosis not present

## 2024-10-23 DIAGNOSIS — F32A Depression, unspecified: Secondary | ICD-10-CM | POA: Diagnosis not present

## 2024-10-23 DIAGNOSIS — I872 Venous insufficiency (chronic) (peripheral): Secondary | ICD-10-CM | POA: Diagnosis not present

## 2024-10-23 DIAGNOSIS — I10 Essential (primary) hypertension: Secondary | ICD-10-CM | POA: Diagnosis not present

## 2024-10-26 DIAGNOSIS — F32A Depression, unspecified: Secondary | ICD-10-CM | POA: Diagnosis not present

## 2024-10-26 DIAGNOSIS — L03119 Cellulitis of unspecified part of limb: Secondary | ICD-10-CM | POA: Diagnosis not present

## 2024-10-26 DIAGNOSIS — I10 Essential (primary) hypertension: Secondary | ICD-10-CM | POA: Diagnosis not present

## 2024-10-26 DIAGNOSIS — M479 Spondylosis, unspecified: Secondary | ICD-10-CM | POA: Diagnosis not present

## 2024-10-26 DIAGNOSIS — G43909 Migraine, unspecified, not intractable, without status migrainosus: Secondary | ICD-10-CM | POA: Diagnosis not present

## 2024-10-26 DIAGNOSIS — L97221 Non-pressure chronic ulcer of left calf limited to breakdown of skin: Secondary | ICD-10-CM | POA: Diagnosis not present

## 2024-10-26 DIAGNOSIS — I872 Venous insufficiency (chronic) (peripheral): Secondary | ICD-10-CM | POA: Diagnosis not present

## 2024-10-26 DIAGNOSIS — G8929 Other chronic pain: Secondary | ICD-10-CM | POA: Diagnosis not present

## 2024-10-26 DIAGNOSIS — M170 Bilateral primary osteoarthritis of knee: Secondary | ICD-10-CM | POA: Diagnosis not present

## 2024-10-29 ENCOUNTER — Other Ambulatory Visit: Payer: Self-pay

## 2024-10-29 ENCOUNTER — Other Ambulatory Visit (HOSPITAL_COMMUNITY): Payer: Self-pay

## 2024-10-30 MED ORDER — MIRABEGRON ER 50 MG PO TB24
50.0000 mg | ORAL_TABLET | Freq: Every day | ORAL | 3 refills | Status: AC
  Filled 2024-10-30 – 2025-01-17 (×7): qty 100, 100d supply, fill #0

## 2024-10-31 ENCOUNTER — Other Ambulatory Visit (HOSPITAL_COMMUNITY): Payer: Self-pay

## 2024-11-05 ENCOUNTER — Other Ambulatory Visit (HOSPITAL_COMMUNITY): Payer: Self-pay

## 2024-11-05 ENCOUNTER — Other Ambulatory Visit: Payer: Self-pay

## 2024-11-05 MED ORDER — GABAPENTIN 100 MG PO CAPS
ORAL_CAPSULE | ORAL | 6 refills | Status: AC
  Filled 2024-11-05: qty 180, 30d supply, fill #0
  Filled 2024-12-06: qty 180, 30d supply, fill #1
  Filled 2025-01-10 (×2): qty 180, 30d supply, fill #2

## 2024-11-06 ENCOUNTER — Other Ambulatory Visit: Payer: Self-pay

## 2024-11-10 DIAGNOSIS — I872 Venous insufficiency (chronic) (peripheral): Secondary | ICD-10-CM | POA: Diagnosis not present

## 2024-11-10 DIAGNOSIS — L03119 Cellulitis of unspecified part of limb: Secondary | ICD-10-CM | POA: Diagnosis not present

## 2024-11-10 DIAGNOSIS — F32A Depression, unspecified: Secondary | ICD-10-CM | POA: Diagnosis not present

## 2024-11-10 DIAGNOSIS — I10 Essential (primary) hypertension: Secondary | ICD-10-CM | POA: Diagnosis not present

## 2024-11-10 DIAGNOSIS — L97221 Non-pressure chronic ulcer of left calf limited to breakdown of skin: Secondary | ICD-10-CM | POA: Diagnosis not present

## 2024-11-10 DIAGNOSIS — G8929 Other chronic pain: Secondary | ICD-10-CM | POA: Diagnosis not present

## 2024-11-10 DIAGNOSIS — G43909 Migraine, unspecified, not intractable, without status migrainosus: Secondary | ICD-10-CM | POA: Diagnosis not present

## 2024-11-10 DIAGNOSIS — M479 Spondylosis, unspecified: Secondary | ICD-10-CM | POA: Diagnosis not present

## 2024-11-10 DIAGNOSIS — M170 Bilateral primary osteoarthritis of knee: Secondary | ICD-10-CM | POA: Diagnosis not present

## 2024-11-12 DIAGNOSIS — M170 Bilateral primary osteoarthritis of knee: Secondary | ICD-10-CM | POA: Diagnosis not present

## 2024-11-12 DIAGNOSIS — L97221 Non-pressure chronic ulcer of left calf limited to breakdown of skin: Secondary | ICD-10-CM | POA: Diagnosis not present

## 2024-11-12 DIAGNOSIS — M479 Spondylosis, unspecified: Secondary | ICD-10-CM | POA: Diagnosis not present

## 2024-11-12 DIAGNOSIS — F32A Depression, unspecified: Secondary | ICD-10-CM | POA: Diagnosis not present

## 2024-11-12 DIAGNOSIS — I872 Venous insufficiency (chronic) (peripheral): Secondary | ICD-10-CM | POA: Diagnosis not present

## 2024-11-12 DIAGNOSIS — L03119 Cellulitis of unspecified part of limb: Secondary | ICD-10-CM | POA: Diagnosis not present

## 2024-11-12 DIAGNOSIS — G8929 Other chronic pain: Secondary | ICD-10-CM | POA: Diagnosis not present

## 2024-11-12 DIAGNOSIS — I10 Essential (primary) hypertension: Secondary | ICD-10-CM | POA: Diagnosis not present

## 2024-11-12 DIAGNOSIS — G43909 Migraine, unspecified, not intractable, without status migrainosus: Secondary | ICD-10-CM | POA: Diagnosis not present

## 2024-11-13 DIAGNOSIS — M479 Spondylosis, unspecified: Secondary | ICD-10-CM | POA: Diagnosis not present

## 2024-11-13 DIAGNOSIS — F32A Depression, unspecified: Secondary | ICD-10-CM | POA: Diagnosis not present

## 2024-11-13 DIAGNOSIS — G43909 Migraine, unspecified, not intractable, without status migrainosus: Secondary | ICD-10-CM | POA: Diagnosis not present

## 2024-11-13 DIAGNOSIS — L97221 Non-pressure chronic ulcer of left calf limited to breakdown of skin: Secondary | ICD-10-CM | POA: Diagnosis not present

## 2024-11-13 DIAGNOSIS — L03119 Cellulitis of unspecified part of limb: Secondary | ICD-10-CM | POA: Diagnosis not present

## 2024-11-13 DIAGNOSIS — G8929 Other chronic pain: Secondary | ICD-10-CM | POA: Diagnosis not present

## 2024-11-13 DIAGNOSIS — M170 Bilateral primary osteoarthritis of knee: Secondary | ICD-10-CM | POA: Diagnosis not present

## 2024-11-13 DIAGNOSIS — I872 Venous insufficiency (chronic) (peripheral): Secondary | ICD-10-CM | POA: Diagnosis not present

## 2024-11-13 DIAGNOSIS — I10 Essential (primary) hypertension: Secondary | ICD-10-CM | POA: Diagnosis not present

## 2024-11-15 ENCOUNTER — Other Ambulatory Visit: Payer: Self-pay | Admitting: Pulmonary Disease

## 2024-11-15 ENCOUNTER — Other Ambulatory Visit (HOSPITAL_COMMUNITY): Payer: Self-pay

## 2024-11-15 DIAGNOSIS — G4733 Obstructive sleep apnea (adult) (pediatric): Secondary | ICD-10-CM

## 2024-11-16 ENCOUNTER — Other Ambulatory Visit: Payer: Self-pay

## 2024-11-19 ENCOUNTER — Ambulatory Visit: Admitting: Pulmonary Disease

## 2024-11-19 ENCOUNTER — Encounter: Payer: Self-pay | Admitting: Pulmonary Disease

## 2024-11-21 ENCOUNTER — Other Ambulatory Visit: Payer: Self-pay

## 2024-11-21 ENCOUNTER — Other Ambulatory Visit (HOSPITAL_COMMUNITY): Payer: Self-pay

## 2024-11-21 MED ORDER — ZEPBOUND 10 MG/0.5ML ~~LOC~~ SOAJ
10.0000 mg | SUBCUTANEOUS | 0 refills | Status: DC
  Filled 2024-11-21: qty 2, 28d supply, fill #0

## 2024-11-23 ENCOUNTER — Other Ambulatory Visit (HOSPITAL_COMMUNITY): Payer: Self-pay

## 2024-12-06 ENCOUNTER — Other Ambulatory Visit: Payer: Self-pay

## 2024-12-06 ENCOUNTER — Other Ambulatory Visit (HOSPITAL_COMMUNITY): Payer: Self-pay

## 2024-12-06 MED ORDER — CYCLOBENZAPRINE HCL 10 MG PO TABS
10.0000 mg | ORAL_TABLET | Freq: Three times a day (TID) | ORAL | 4 refills | Status: AC | PRN
  Filled 2024-12-06 – 2024-12-17 (×3): qty 60, 20d supply, fill #0
  Filled 2025-01-10 (×2): qty 60, 20d supply, fill #1

## 2024-12-10 ENCOUNTER — Other Ambulatory Visit (HOSPITAL_COMMUNITY): Payer: Self-pay

## 2024-12-10 ENCOUNTER — Other Ambulatory Visit: Payer: Self-pay

## 2024-12-14 ENCOUNTER — Other Ambulatory Visit: Payer: Self-pay | Admitting: Pulmonary Disease

## 2024-12-14 ENCOUNTER — Other Ambulatory Visit (HOSPITAL_COMMUNITY): Payer: Self-pay

## 2024-12-14 DIAGNOSIS — G4733 Obstructive sleep apnea (adult) (pediatric): Secondary | ICD-10-CM

## 2024-12-15 ENCOUNTER — Other Ambulatory Visit (HOSPITAL_COMMUNITY): Payer: Self-pay

## 2024-12-17 ENCOUNTER — Other Ambulatory Visit: Payer: Self-pay

## 2024-12-17 ENCOUNTER — Other Ambulatory Visit: Payer: Self-pay | Admitting: Pulmonary Disease

## 2024-12-17 ENCOUNTER — Telehealth: Payer: Self-pay

## 2024-12-17 ENCOUNTER — Other Ambulatory Visit (HOSPITAL_COMMUNITY): Payer: Self-pay

## 2024-12-17 DIAGNOSIS — G4733 Obstructive sleep apnea (adult) (pediatric): Secondary | ICD-10-CM

## 2024-12-17 MED ORDER — ZEPBOUND 12.5 MG/0.5ML ~~LOC~~ SOAJ
12.5000 mg | SUBCUTANEOUS | 0 refills | Status: DC
  Filled 2024-12-17: qty 2, 28d supply, fill #0

## 2024-12-17 MED ORDER — LIDOCAINE 5 % EX PTCH
1.0000 | MEDICATED_PATCH | CUTANEOUS | 7 refills | Status: AC
  Filled 2024-12-17: qty 30, 30d supply, fill #0
  Filled 2025-01-17: qty 30, 30d supply, fill #1

## 2024-12-17 NOTE — Telephone Encounter (Signed)
 Patient VBU, Rx at pharmacy     Copied from CRM [id number]. Topic: Clinical - Prescription Issue >> Dec 17, 2024 11:54 AM Corean SAUNDERS wrote: Reason for CRM: Patient states she is due for Zepbound  injection tomorrow and requested a refill on Tuesday 12/16 - Patient is requesting Dr. Kara to please order this to her pharmacy today so that she does not skip a dose.   Pharmacy  [facility] - [facility] [address], [place] [address] Phone: [phone]  Fax: [phone]

## 2024-12-18 ENCOUNTER — Other Ambulatory Visit: Payer: Self-pay

## 2025-01-04 ENCOUNTER — Encounter: Payer: Self-pay | Admitting: Pulmonary Disease

## 2025-01-07 ENCOUNTER — Telehealth: Payer: Self-pay

## 2025-01-07 ENCOUNTER — Other Ambulatory Visit: Payer: Self-pay

## 2025-01-07 DIAGNOSIS — G4733 Obstructive sleep apnea (adult) (pediatric): Secondary | ICD-10-CM

## 2025-01-07 MED ORDER — ZEPBOUND 7.5 MG/0.5ML ~~LOC~~ SOAJ
7.5000 mg | SUBCUTANEOUS | 2 refills | Status: AC
  Filled 2025-01-07: qty 2, 28d supply, fill #0

## 2025-01-07 NOTE — Telephone Encounter (Signed)
 I have reduced her dose back to 7.5mg  as requested.   She can try benedryl as needed for the itching in the mean time.  Thanks, JD

## 2025-01-08 ENCOUNTER — Other Ambulatory Visit (HOSPITAL_COMMUNITY): Payer: Self-pay

## 2025-01-08 ENCOUNTER — Other Ambulatory Visit: Payer: Self-pay

## 2025-01-08 NOTE — Telephone Encounter (Signed)
 Spoke with patient [name] [name], told her let us  know how she is in few days   -NFN

## 2025-01-08 NOTE — Telephone Encounter (Signed)
 NFN

## 2025-01-10 ENCOUNTER — Other Ambulatory Visit (HOSPITAL_COMMUNITY): Payer: Self-pay

## 2025-01-11 ENCOUNTER — Other Ambulatory Visit: Payer: Self-pay

## 2025-01-17 ENCOUNTER — Other Ambulatory Visit: Payer: Self-pay | Admitting: Physician Assistant

## 2025-01-17 ENCOUNTER — Other Ambulatory Visit: Payer: Self-pay

## 2025-01-17 ENCOUNTER — Other Ambulatory Visit (HOSPITAL_COMMUNITY): Payer: Self-pay

## 2025-01-17 DIAGNOSIS — I10 Essential (primary) hypertension: Secondary | ICD-10-CM

## 2025-01-30 ENCOUNTER — Telehealth: Payer: Self-pay

## 2025-02-01 NOTE — Telephone Encounter (Signed)
 NFN
